# Patient Record
Sex: Female | Born: 1965 | Race: White | Hispanic: No | Marital: Single | Smoking: Former smoker
Health system: Southern US, Community
[De-identification: ages and names within clinical notes are randomized; demographics above are authoritative.]

## PROBLEM LIST (undated history)

## (undated) DIAGNOSIS — I493 Ventricular premature depolarization: Secondary | ICD-10-CM

## (undated) DIAGNOSIS — I447 Left bundle-branch block, unspecified: Secondary | ICD-10-CM

## (undated) DIAGNOSIS — F419 Anxiety disorder, unspecified: Secondary | ICD-10-CM

## (undated) DIAGNOSIS — F32A Depression, unspecified: Secondary | ICD-10-CM

## (undated) DIAGNOSIS — Q189 Congenital malformation of face and neck, unspecified: Secondary | ICD-10-CM

## (undated) DIAGNOSIS — E785 Hyperlipidemia, unspecified: Secondary | ICD-10-CM

## (undated) DIAGNOSIS — E669 Obesity, unspecified: Secondary | ICD-10-CM

## (undated) DIAGNOSIS — E041 Nontoxic single thyroid nodule: Secondary | ICD-10-CM

## (undated) DIAGNOSIS — I10 Essential (primary) hypertension: Secondary | ICD-10-CM

## (undated) DIAGNOSIS — K3184 Gastroparesis: Secondary | ICD-10-CM

## (undated) DIAGNOSIS — F329 Major depressive disorder, single episode, unspecified: Secondary | ICD-10-CM

## (undated) DIAGNOSIS — Z309 Encounter for contraceptive management, unspecified: Secondary | ICD-10-CM

## (undated) DIAGNOSIS — K449 Diaphragmatic hernia without obstruction or gangrene: Secondary | ICD-10-CM

## (undated) DIAGNOSIS — K219 Gastro-esophageal reflux disease without esophagitis: Secondary | ICD-10-CM

## (undated) DIAGNOSIS — B354 Tinea corporis: Secondary | ICD-10-CM

## (undated) DIAGNOSIS — L500 Allergic urticaria: Secondary | ICD-10-CM

## (undated) DIAGNOSIS — N390 Urinary tract infection, site not specified: Secondary | ICD-10-CM

## (undated) DIAGNOSIS — R87629 Unspecified abnormal cytological findings in specimens from vagina: Secondary | ICD-10-CM

## (undated) DIAGNOSIS — F1010 Alcohol abuse, uncomplicated: Secondary | ICD-10-CM

## (undated) DIAGNOSIS — E119 Type 2 diabetes mellitus without complications: Secondary | ICD-10-CM

## (undated) DIAGNOSIS — K810 Acute cholecystitis: Secondary | ICD-10-CM

## (undated) DIAGNOSIS — N200 Calculus of kidney: Secondary | ICD-10-CM

## (undated) HISTORY — DX: Urinary tract infection, site not specified: N39.0

## (undated) HISTORY — DX: Essential (primary) hypertension: I10

## (undated) HISTORY — DX: Ventricular premature depolarization: I49.3

## (undated) HISTORY — DX: Depression, unspecified: F32.A

## (undated) HISTORY — DX: Allergic urticaria: L50.0

## (undated) HISTORY — DX: Hyperlipidemia, unspecified: E78.5

## (undated) HISTORY — DX: Nontoxic single thyroid nodule: E04.1

## (undated) HISTORY — DX: Encounter for contraceptive management, unspecified: Z30.9

## (undated) HISTORY — DX: Acute cholecystitis: K81.0

## (undated) HISTORY — PX: TONSILLECTOMY: SUR1361

## (undated) HISTORY — DX: Tinea corporis: B35.4

## (undated) HISTORY — DX: Diaphragmatic hernia without obstruction or gangrene: K44.9

## (undated) HISTORY — DX: Gastro-esophageal reflux disease without esophagitis: K21.9

## (undated) HISTORY — DX: Anxiety disorder, unspecified: F41.9

## (undated) HISTORY — DX: Alcohol abuse, uncomplicated: F10.10

## (undated) HISTORY — DX: Congenital malformation of face and neck, unspecified: Q18.9

## (undated) HISTORY — DX: Unspecified abnormal cytological findings in specimens from vagina: R87.629

## (undated) HISTORY — DX: Calculus of kidney: N20.0

## (undated) HISTORY — DX: Obesity, unspecified: E66.9

## (undated) HISTORY — DX: Major depressive disorder, single episode, unspecified: F32.9

## (undated) HISTORY — PX: OTHER SURGICAL HISTORY: SHX169

## (undated) HISTORY — DX: Gastroparesis: K31.84

---

## 2007-01-21 ENCOUNTER — Encounter: Payer: Self-pay | Admitting: Family Medicine

## 2007-01-21 ENCOUNTER — Ambulatory Visit: Payer: Self-pay | Admitting: Family Medicine

## 2007-01-21 ENCOUNTER — Other Ambulatory Visit: Admission: RE | Admit: 2007-01-21 | Discharge: 2007-01-21 | Payer: Self-pay | Admitting: Family Medicine

## 2007-01-21 DIAGNOSIS — I10 Essential (primary) hypertension: Secondary | ICD-10-CM | POA: Insufficient documentation

## 2007-01-21 DIAGNOSIS — R319 Hematuria, unspecified: Secondary | ICD-10-CM | POA: Insufficient documentation

## 2007-01-21 DIAGNOSIS — Z862 Personal history of diseases of the blood and blood-forming organs and certain disorders involving the immune mechanism: Secondary | ICD-10-CM | POA: Insufficient documentation

## 2007-01-21 DIAGNOSIS — Q898 Other specified congenital malformations: Secondary | ICD-10-CM | POA: Insufficient documentation

## 2007-01-21 DIAGNOSIS — Z8639 Personal history of other endocrine, nutritional and metabolic disease: Secondary | ICD-10-CM

## 2007-01-21 LAB — CONVERTED CEMR LAB
Beta hcg, urine, semiquantitative: NEGATIVE
Bilirubin Urine: NEGATIVE
Glucose, Urine, Semiquant: NEGATIVE
Ketones, urine, test strip: NEGATIVE
Nitrite: NEGATIVE

## 2007-01-24 ENCOUNTER — Encounter (INDEPENDENT_AMBULATORY_CARE_PROVIDER_SITE_OTHER): Payer: Self-pay | Admitting: *Deleted

## 2007-01-25 ENCOUNTER — Telehealth (INDEPENDENT_AMBULATORY_CARE_PROVIDER_SITE_OTHER): Payer: Self-pay | Admitting: *Deleted

## 2007-01-27 ENCOUNTER — Encounter (INDEPENDENT_AMBULATORY_CARE_PROVIDER_SITE_OTHER): Payer: Self-pay | Admitting: *Deleted

## 2007-01-28 ENCOUNTER — Encounter: Admission: RE | Admit: 2007-01-28 | Discharge: 2007-01-28 | Payer: Self-pay | Admitting: Family Medicine

## 2007-01-28 ENCOUNTER — Ambulatory Visit: Payer: Self-pay | Admitting: Family Medicine

## 2007-02-01 LAB — CONVERTED CEMR LAB
AST: 22 units/L (ref 0–37)
Albumin: 3.8 g/dL (ref 3.5–5.2)
BUN: 8 mg/dL (ref 6–23)
Basophils Absolute: 0 10*3/uL (ref 0.0–0.1)
Basophils Relative: 0.1 % (ref 0.0–1.0)
Bilirubin, Direct: 0.1 mg/dL (ref 0.0–0.3)
CO2: 26 meq/L (ref 19–32)
Eosinophils Absolute: 0.1 10*3/uL (ref 0.0–0.6)
Eosinophils Relative: 1.8 % (ref 0.0–5.0)
HDL: 35.7 mg/dL — ABNORMAL LOW (ref >39.0)
Hemoglobin: 15.6 g/dL — ABNORMAL HIGH (ref 12.0–15.0)
MCHC: 35.2 g/dL (ref 30.0–36.0)
Neutro Abs: 5.7 10*3/uL (ref 1.4–7.7)
Neutrophils Relative %: 69.5 % (ref 43.0–77.0)
Platelets: 236 10*3/uL (ref 150–400)
RBC: 4.74 M/uL (ref 3.87–5.11)
Sodium: 140 meq/L (ref 135–145)
Triglycerides: 86 mg/dL (ref 0–149)
WBC: 8.1 10*3/uL (ref 4.5–10.5)

## 2007-02-08 ENCOUNTER — Telehealth (INDEPENDENT_AMBULATORY_CARE_PROVIDER_SITE_OTHER): Payer: Self-pay | Admitting: *Deleted

## 2007-02-14 ENCOUNTER — Telehealth (INDEPENDENT_AMBULATORY_CARE_PROVIDER_SITE_OTHER): Payer: Self-pay | Admitting: *Deleted

## 2007-02-24 ENCOUNTER — Encounter: Payer: Self-pay | Admitting: Family Medicine

## 2007-02-24 DIAGNOSIS — F329 Major depressive disorder, single episode, unspecified: Secondary | ICD-10-CM | POA: Insufficient documentation

## 2007-02-24 DIAGNOSIS — F3289 Other specified depressive episodes: Secondary | ICD-10-CM | POA: Insufficient documentation

## 2007-03-15 ENCOUNTER — Telehealth (INDEPENDENT_AMBULATORY_CARE_PROVIDER_SITE_OTHER): Payer: Self-pay | Admitting: *Deleted

## 2007-03-22 ENCOUNTER — Encounter: Payer: Self-pay | Admitting: Family Medicine

## 2007-04-22 ENCOUNTER — Encounter: Payer: Self-pay | Admitting: Family Medicine

## 2007-07-11 ENCOUNTER — Encounter: Admission: RE | Admit: 2007-07-11 | Discharge: 2007-07-11 | Payer: Self-pay | Admitting: Family Medicine

## 2007-07-11 ENCOUNTER — Encounter: Admission: RE | Admit: 2007-07-11 | Discharge: 2007-07-11 | Payer: Self-pay | Admitting: Endocrinology

## 2007-07-18 ENCOUNTER — Encounter (INDEPENDENT_AMBULATORY_CARE_PROVIDER_SITE_OTHER): Payer: Self-pay | Admitting: *Deleted

## 2007-09-02 ENCOUNTER — Ambulatory Visit: Payer: Self-pay | Admitting: Family Medicine

## 2007-09-02 DIAGNOSIS — K219 Gastro-esophageal reflux disease without esophagitis: Secondary | ICD-10-CM | POA: Insufficient documentation

## 2007-09-16 ENCOUNTER — Ambulatory Visit: Payer: Self-pay | Admitting: Family Medicine

## 2007-09-17 LAB — CONVERTED CEMR LAB
ALT: 27 units/L (ref 0–35)
Albumin: 4 g/dL (ref 3.5–5.2)
Alkaline Phosphatase: 51 units/L (ref 39–117)
Bilirubin, Direct: 0.1 mg/dL (ref 0.0–0.3)
Creatinine, Ser: 0.8 mg/dL (ref 0.4–1.2)
GFR calc non Af Amer: 85 mL/min
Glucose, Bld: 88 mg/dL (ref 70–99)
Potassium: 3.7 meq/L (ref 3.5–5.1)
Sodium: 138 meq/L (ref 135–145)
Total Bilirubin: 1 mg/dL (ref 0.3–1.2)
Total CHOL/HDL Ratio: 6.9
Total Protein: 7.1 g/dL (ref 6.0–8.3)
VLDL: 15 mg/dL (ref 0–40)

## 2007-09-19 ENCOUNTER — Encounter (INDEPENDENT_AMBULATORY_CARE_PROVIDER_SITE_OTHER): Payer: Self-pay | Admitting: *Deleted

## 2007-09-28 ENCOUNTER — Encounter: Payer: Self-pay | Admitting: Family Medicine

## 2007-10-03 ENCOUNTER — Telehealth (INDEPENDENT_AMBULATORY_CARE_PROVIDER_SITE_OTHER): Payer: Self-pay | Admitting: *Deleted

## 2007-10-20 ENCOUNTER — Telehealth (INDEPENDENT_AMBULATORY_CARE_PROVIDER_SITE_OTHER): Payer: Self-pay | Admitting: *Deleted

## 2007-11-25 ENCOUNTER — Ambulatory Visit: Payer: Self-pay | Admitting: Family Medicine

## 2008-01-20 ENCOUNTER — Other Ambulatory Visit: Admission: RE | Admit: 2008-01-20 | Discharge: 2008-01-20 | Payer: Self-pay | Admitting: Family Medicine

## 2008-01-20 ENCOUNTER — Ambulatory Visit: Payer: Self-pay | Admitting: Family Medicine

## 2008-01-20 ENCOUNTER — Encounter: Payer: Self-pay | Admitting: Family Medicine

## 2008-01-20 DIAGNOSIS — B354 Tinea corporis: Secondary | ICD-10-CM | POA: Insufficient documentation

## 2008-01-25 ENCOUNTER — Encounter (INDEPENDENT_AMBULATORY_CARE_PROVIDER_SITE_OTHER): Payer: Self-pay | Admitting: *Deleted

## 2008-01-29 LAB — CONVERTED CEMR LAB
ALT: 28 units/L (ref 0–35)
AST: 21 units/L (ref 0–37)
BUN: 11 mg/dL (ref 6–23)
Basophils Relative: 0.1 % (ref 0.0–3.0)
Creatinine, Ser: 0.9 mg/dL (ref 0.4–1.2)
Direct LDL: 148.1 mg/dL
GFR calc Af Amer: 89 mL/min
GFR calc non Af Amer: 74 mL/min
Lymphocytes Relative: 24.2 % (ref 12.0–46.0)
Monocytes Absolute: 0.4 10*3/uL (ref 0.1–1.0)
Monocytes Relative: 6 % (ref 3.0–12.0)
RBC: 4.84 M/uL (ref 3.87–5.11)
Triglycerides: 77 mg/dL (ref 0–149)
VLDL: 15 mg/dL (ref 0–40)
WBC: 7 10*3/uL (ref 4.5–10.5)

## 2008-01-30 ENCOUNTER — Encounter (INDEPENDENT_AMBULATORY_CARE_PROVIDER_SITE_OTHER): Payer: Self-pay | Admitting: *Deleted

## 2008-02-05 ENCOUNTER — Emergency Department (HOSPITAL_COMMUNITY): Admission: EM | Admit: 2008-02-05 | Discharge: 2008-02-05 | Payer: Self-pay | Admitting: Emergency Medicine

## 2008-02-24 ENCOUNTER — Ambulatory Visit: Payer: Self-pay | Admitting: Family Medicine

## 2008-03-12 ENCOUNTER — Telehealth (INDEPENDENT_AMBULATORY_CARE_PROVIDER_SITE_OTHER): Payer: Self-pay | Admitting: *Deleted

## 2008-03-16 ENCOUNTER — Telehealth (INDEPENDENT_AMBULATORY_CARE_PROVIDER_SITE_OTHER): Payer: Self-pay | Admitting: *Deleted

## 2008-03-16 ENCOUNTER — Ambulatory Visit: Payer: Self-pay | Admitting: Family Medicine

## 2008-03-16 DIAGNOSIS — L500 Allergic urticaria: Secondary | ICD-10-CM | POA: Insufficient documentation

## 2008-03-20 ENCOUNTER — Ambulatory Visit: Payer: Self-pay | Admitting: Internal Medicine

## 2008-03-22 ENCOUNTER — Telehealth (INDEPENDENT_AMBULATORY_CARE_PROVIDER_SITE_OTHER): Payer: Self-pay | Admitting: *Deleted

## 2008-03-23 ENCOUNTER — Ambulatory Visit: Payer: Self-pay | Admitting: Internal Medicine

## 2008-03-23 ENCOUNTER — Encounter: Payer: Self-pay | Admitting: Internal Medicine

## 2008-03-23 LAB — CONVERTED CEMR LAB: UREASE: NEGATIVE

## 2008-03-27 ENCOUNTER — Encounter: Payer: Self-pay | Admitting: Internal Medicine

## 2008-05-15 ENCOUNTER — Ambulatory Visit: Payer: Self-pay | Admitting: Family Medicine

## 2008-05-28 ENCOUNTER — Telehealth (INDEPENDENT_AMBULATORY_CARE_PROVIDER_SITE_OTHER): Payer: Self-pay | Admitting: *Deleted

## 2008-05-29 ENCOUNTER — Telehealth (INDEPENDENT_AMBULATORY_CARE_PROVIDER_SITE_OTHER): Payer: Self-pay | Admitting: *Deleted

## 2008-07-30 ENCOUNTER — Telehealth: Payer: Self-pay | Admitting: Family Medicine

## 2008-08-07 ENCOUNTER — Ambulatory Visit: Payer: Self-pay | Admitting: Family Medicine

## 2008-08-07 DIAGNOSIS — K810 Acute cholecystitis: Secondary | ICD-10-CM | POA: Insufficient documentation

## 2008-08-24 ENCOUNTER — Encounter: Admission: RE | Admit: 2008-08-24 | Discharge: 2008-08-24 | Payer: Self-pay | Admitting: Endocrinology

## 2008-11-06 ENCOUNTER — Ambulatory Visit: Payer: Self-pay | Admitting: Family Medicine

## 2008-11-06 ENCOUNTER — Telehealth (INDEPENDENT_AMBULATORY_CARE_PROVIDER_SITE_OTHER): Payer: Self-pay | Admitting: *Deleted

## 2008-11-06 ENCOUNTER — Encounter (INDEPENDENT_AMBULATORY_CARE_PROVIDER_SITE_OTHER): Payer: Self-pay | Admitting: *Deleted

## 2009-01-18 ENCOUNTER — Telehealth: Payer: Self-pay | Admitting: Family Medicine

## 2009-02-12 ENCOUNTER — Ambulatory Visit: Payer: Self-pay | Admitting: Family Medicine

## 2009-02-12 LAB — CONVERTED CEMR LAB
Bilirubin Urine: NEGATIVE
Glucose, Urine, Semiquant: NEGATIVE
Ketones, urine, test strip: NEGATIVE
Nitrite: NEGATIVE
Protein, U semiquant: NEGATIVE
Urobilinogen, UA: 0.2
pH: 5

## 2009-02-18 ENCOUNTER — Encounter: Payer: Self-pay | Admitting: Family Medicine

## 2009-02-18 LAB — CONVERTED CEMR LAB
AST: 23 units/L (ref 0–37)
Albumin: 4 g/dL (ref 3.5–5.2)
Alkaline Phosphatase: 56 units/L (ref 39–117)
Basophils Relative: 0.7 % (ref 0.0–3.0)
Chloride: 109 meq/L (ref 96–112)
Eosinophils Absolute: 0.1 10*3/uL (ref 0.0–0.7)
Eosinophils Relative: 1.2 % (ref 0.0–5.0)
HDL: 37.7 mg/dL — ABNORMAL LOW (ref >39.00)
LDL Cholesterol: 118 mg/dL — ABNORMAL HIGH (ref 0–99)
Lymphocytes Relative: 21.7 % (ref 12.0–46.0)
MCV: 93.2 fL (ref 78.0–100.0)
Potassium: 4.2 meq/L (ref 3.5–5.1)
RBC: 4.73 M/uL (ref 3.87–5.11)
TSH: 0.58 microintl units/mL (ref 0.35–5.50)
Total Bilirubin: 0.8 mg/dL (ref 0.3–1.2)
Total CHOL/HDL Ratio: 5
Total Protein: 7.5 g/dL (ref 6.0–8.3)
VLDL: 15.6 mg/dL (ref 0.0–40.0)

## 2009-02-21 ENCOUNTER — Telehealth (INDEPENDENT_AMBULATORY_CARE_PROVIDER_SITE_OTHER): Payer: Self-pay | Admitting: *Deleted

## 2009-02-21 ENCOUNTER — Other Ambulatory Visit: Admission: RE | Admit: 2009-02-21 | Discharge: 2009-02-21 | Payer: Self-pay | Admitting: Family Medicine

## 2009-02-21 ENCOUNTER — Encounter: Payer: Self-pay | Admitting: Family Medicine

## 2009-02-21 ENCOUNTER — Ambulatory Visit: Payer: Self-pay | Admitting: Family Medicine

## 2009-02-21 DIAGNOSIS — R079 Chest pain, unspecified: Secondary | ICD-10-CM | POA: Insufficient documentation

## 2009-02-21 DIAGNOSIS — Z8719 Personal history of other diseases of the digestive system: Secondary | ICD-10-CM | POA: Insufficient documentation

## 2009-02-21 DIAGNOSIS — R159 Full incontinence of feces: Secondary | ICD-10-CM | POA: Insufficient documentation

## 2009-02-22 ENCOUNTER — Telehealth: Payer: Self-pay | Admitting: Family Medicine

## 2009-02-23 ENCOUNTER — Encounter: Payer: Self-pay | Admitting: Cardiovascular Disease

## 2009-02-25 ENCOUNTER — Encounter: Payer: Self-pay | Admitting: Family Medicine

## 2009-02-26 ENCOUNTER — Encounter: Payer: Self-pay | Admitting: Family Medicine

## 2009-02-26 ENCOUNTER — Ambulatory Visit: Payer: Self-pay

## 2009-02-27 ENCOUNTER — Ambulatory Visit: Payer: Self-pay | Admitting: Cardiovascular Disease

## 2009-02-27 DIAGNOSIS — I4949 Other premature depolarization: Secondary | ICD-10-CM | POA: Insufficient documentation

## 2009-02-27 DIAGNOSIS — R42 Dizziness and giddiness: Secondary | ICD-10-CM | POA: Insufficient documentation

## 2009-03-05 ENCOUNTER — Telehealth (INDEPENDENT_AMBULATORY_CARE_PROVIDER_SITE_OTHER): Payer: Self-pay | Admitting: *Deleted

## 2009-03-26 ENCOUNTER — Ambulatory Visit: Payer: Self-pay | Admitting: Internal Medicine

## 2009-04-24 ENCOUNTER — Ambulatory Visit: Payer: Self-pay | Admitting: Cardiovascular Disease

## 2009-06-14 ENCOUNTER — Ambulatory Visit: Payer: Self-pay | Admitting: Family Medicine

## 2009-06-26 ENCOUNTER — Encounter: Payer: Self-pay | Admitting: Family Medicine

## 2009-07-12 ENCOUNTER — Ambulatory Visit: Payer: Self-pay | Admitting: Cardiovascular Disease

## 2009-07-12 DIAGNOSIS — I119 Hypertensive heart disease without heart failure: Secondary | ICD-10-CM | POA: Insufficient documentation

## 2009-08-23 ENCOUNTER — Ambulatory Visit: Payer: Self-pay | Admitting: Family Medicine

## 2009-08-26 ENCOUNTER — Encounter: Admission: RE | Admit: 2009-08-26 | Discharge: 2009-08-26 | Payer: Self-pay | Admitting: Endocrinology

## 2009-08-27 LAB — CONVERTED CEMR LAB
ALT: 32 units/L (ref 0–35)
Bilirubin, Direct: 0 mg/dL (ref 0.0–0.3)
HDL: 48.6 mg/dL (ref >39.00)
Total Bilirubin: 0.4 mg/dL (ref 0.3–1.2)

## 2010-01-06 ENCOUNTER — Ambulatory Visit: Payer: Self-pay | Admitting: Cardiovascular Disease

## 2010-03-07 ENCOUNTER — Ambulatory Visit: Payer: Self-pay | Admitting: Family Medicine

## 2010-03-07 ENCOUNTER — Other Ambulatory Visit: Admission: RE | Admit: 2010-03-07 | Discharge: 2010-03-07 | Payer: Self-pay | Admitting: Family Medicine

## 2010-03-10 LAB — CONVERTED CEMR LAB
Eosinophils Absolute: 0.1 10*3/uL (ref 0.0–0.7)
Lymphocytes Relative: 22.6 % (ref 12.0–46.0)
MCHC: 34 g/dL (ref 30.0–36.0)
MCV: 94.3 fL (ref 78.0–100.0)
Monocytes Relative: 5.7 % (ref 3.0–12.0)
Platelets: 194 10*3/uL (ref 150.0–400.0)
RDW: 13.5 % (ref 11.5–14.6)
TSH: 0.41 microintl units/mL (ref 0.35–5.50)

## 2010-03-12 ENCOUNTER — Encounter: Payer: Self-pay | Admitting: Family Medicine

## 2010-03-13 ENCOUNTER — Ambulatory Visit: Payer: Self-pay | Admitting: Family Medicine

## 2010-03-21 ENCOUNTER — Ambulatory Visit: Payer: Self-pay | Admitting: Family Medicine

## 2010-03-21 DIAGNOSIS — B079 Viral wart, unspecified: Secondary | ICD-10-CM | POA: Insufficient documentation

## 2010-04-02 ENCOUNTER — Telehealth (INDEPENDENT_AMBULATORY_CARE_PROVIDER_SITE_OTHER): Payer: Self-pay | Admitting: *Deleted

## 2010-04-10 ENCOUNTER — Ambulatory Visit: Payer: Self-pay | Admitting: Family Medicine

## 2010-04-10 DIAGNOSIS — E785 Hyperlipidemia, unspecified: Secondary | ICD-10-CM | POA: Insufficient documentation

## 2010-04-22 ENCOUNTER — Ambulatory Visit: Payer: Self-pay | Admitting: Family Medicine

## 2010-04-22 DIAGNOSIS — N760 Acute vaginitis: Secondary | ICD-10-CM | POA: Insufficient documentation

## 2010-04-22 DIAGNOSIS — Z87448 Personal history of other diseases of urinary system: Secondary | ICD-10-CM | POA: Insufficient documentation

## 2010-04-22 LAB — CONVERTED CEMR LAB
Bilirubin Urine: NEGATIVE
Glucose, Urine, Semiquant: NEGATIVE
Protein, U semiquant: NEGATIVE

## 2010-04-23 ENCOUNTER — Encounter: Payer: Self-pay | Admitting: Family Medicine

## 2010-04-23 ENCOUNTER — Telehealth (INDEPENDENT_AMBULATORY_CARE_PROVIDER_SITE_OTHER): Payer: Self-pay | Admitting: *Deleted

## 2010-04-23 LAB — CONVERTED CEMR LAB
Chlamydia, Swab/Urine, PCR: NEGATIVE
GC Probe Amp, Urine: NEGATIVE

## 2010-05-15 ENCOUNTER — Telehealth: Payer: Self-pay | Admitting: Cardiovascular Disease

## 2010-07-02 NOTE — Letter (Signed)
Summary: Proof of Physical Form/[employer]  Proof of Physical Form/[facility] [employer]   Imported By: Lanelle Bal 08/31/2009 11:24:39  _____________________________________________________________________  External Attachment:    Type:   Image     Comment:   External Document

## 2010-07-02 NOTE — Assessment & Plan Note (Signed)
Summary: FOR A SHOT SHE WILL BE BRINGING//PH  Nurse Visit   Allergies: 1)  ! Feldene (Piroxicam)  Medication Administration  Injection # 1:    Medication: Depo-Provera 150mg     Diagnosis: OTHER SPECIFIED CONTRACEPTIVE MANAGEMENT (ICD-V25.8)    Route: IM    Site: R deltoid    Exp Date: 07/2011    Lot #: [id number]    Mfr: [email or web address]    Patient tolerated injection without complications    Given by: Floydene Flock (June 14, 2009 10:18 AM)  Orders Added: 1)  Admin of patients own med IM/SQ [96372M]   Medication Administration  Injection # 1:    Medication: Depo-Provera 150mg     Diagnosis: OTHER SPECIFIED CONTRACEPTIVE MANAGEMENT (ICD-V25.8)    Route: IM    Site: R deltoid    Exp Date: 07/2011    Lot #: [id number]    Mfr: greenstone    Patient tolerated injection without complications    Given by: Floydene Flock (June 14, 2009 10:18 AM)  Orders Added: 1)  Admin of patients own med IM/SQ [phone]

## 2010-07-02 NOTE — Letter (Signed)
Summary: [facility]  [facility]   Imported By: Lanelle Bal 07/03/2009 12:10:00  _____________________________________________________________________  External Attachment:    Type:   Image     Comment:   External Document

## 2010-07-02 NOTE — Assessment & Plan Note (Signed)
Summary: vaginal itch/frequent urination/cbs   Vital Signs:  Patient profile:   45 year old female Weight:      181.2 pounds Temp:     98.0 degrees F oral Pulse rate:   60 / minute Pulse rhythm:   regular BP sitting:   114 / 70  (right arm) Cuff size:   large  Vitals Entered By: Almeta Monas CMA Duncan Dull) (April 22, 2010 11:28 AM) CC: c/o frequency and vaginal itching x1week   History of Present Illness: Pt here c/o vaginal itchin and frequency.  No D/c or odor per pt .  Current Medications (verified): 1)  Atenolol 100 Mg Tabs (Atenolol) .... Take One Tablet By Mouth Daily 2)  Depo-Provera 150 Mg/ml Im Susp (Medroxyprogesterone Acetate) .Marland Kitchen.. 1 Injection Every 3 Months 3)  Oxybutynin Chloride 10 Mg Xr24h-Tab (Oxybutynin Chloride) .Marland Kitchen.. 1 By Mouth Once Daily 4)  Prilosec 20 Mg  Cpdr (Omeprazole) .Marland Kitchen.. 1 Tab Once Daily 5)  Flexeril 10 Mg Tabs (Cyclobenzaprine Hcl) .Marland Kitchen.. 1 By Mouth Three Times A Day As Needed. 6)  Anusol-Hc 25 Mg Supp (Hydrocortisone Acetate) .... Insert 1 Suppository Into Rectum At Bedtime 7)  Bentyl 20 Mg Tabs (Dicyclomine Hcl) .... Take 1 Tablet By Mouth Once Daily 8)  Simcor 1000-40 Mg Xr24h-Tab (Niacin-Simvastatin) .Marland Kitchen.. 1 By Mouth At Bedtime 9)  Fluconazole 150 Mg Tabs (Fluconazole) .Marland Kitchen.. 1 By Mouth Once Daily X1  , May Repeat in 3 Days As Needed  Allergies (verified): 1)  ! Feldene (Piroxicam)  Past History:  Past Medical History: Last updated: 07/12/2009 Hypertension with hyptertensive heart disease, grade 1 diastolic dysfunction.  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 Depression Kidney Stones Obesity Urinary Tract Infection Hiatal hernia  Past Surgical History: Last updated: 02/27/2009 Tonsillectomy LEEP SURGERY-MID 20'S    Current Problems:  HIATAL HERNIA WITH REFLUX, HX OF (ICD-V12.79) OTHER SPECIFIED CONTRACEPTIVE MANAGEMENT (ICD-V25.8) CHOLECYSTITIS - ACUTE (ICD-575.0) * CONTRACEPTIVE MANAGEMENT TOBACCO ABUSE  (ICD-305.1) ALLERGIC URTICARIA (ICD-708.0) TINEA CORPORIS (ICD-110.5) PREVENTIVE HEALTH CARE (ICD-V70.0) FAMILY HISTORY BREAST CANCER 1ST DEGREE RELATIVE <50 (ICD-V16.3) UNSPECIFIED CONTRACEPTIVE MANAGEMENT (ICD-V25.9) GERD (ICD-530.81) DEPRESSION (ICD-311) PREVENTIVE HEALTH CARE (ICD-V70.0) THYROID NODULE, HX OF (ICD-V12.2) HEMATURIA (ICD-599.7) ANOMALY, 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) HYPERTENSION (ICD-401.9) FAMILY HISTORY DIABETES 1ST DEGREE RELATIVE (ICD-V18.0)  Family History: Last updated: 03/07/2010 FATHER: LIVING, no cardiac problems 1 BROTHER: LIVING Family History Hypertension; BOTH SIDES OF FAMILY Grandmother had MI age 65 Family History Lung cancer: PGM, MGM Family History Diabetes 1st degree relative: MOTHER, PGM, AUNT(FATHERS SIDE) Family History Thyroid disease: MOTHER Family History Breast cancer 1st degree relative --43yo Family History of Breast Cancer:Mother No FH of Colon Cancer: Family History of Colon Polyps:Mother Family History of Diabetes: Mother Family History of Irritable Bowel Syndrome:Mother Family History of Liver Disease/Cirrhosis:Mother Family History of CAD Female 1st degree relative 43yo  Social History: Last updated: 02/27/2009 Occupation: [employer]  Single Former tobacco use-1 ppd, none since February 2010 Alcohol use-no--Hx alcoholsim-- detox 7/13-7/17/07 Drug use-no Regular exercise-no  Risk Factors: Alcohol Use: 0 (03/07/2010) Caffeine Use: 0 (03/07/2010) Diet: weight watchers  (03/07/2010) Exercise: no (03/07/2010)  Risk Factors: Smoking Status: quit > 6 months (03/07/2010) Packs/Day: 1.0 (03/07/2010) Cans of tobacco/wk: no (03/07/2010) Passive Smoke Exposure: no (03/07/2010)  Family History: Reviewed history from 03/07/2010 and no changes required. FATHER: LIVING, no cardiac problems 1 BROTHER: LIVING Family History Hypertension; BOTH SIDES OF FAMILY Grandmother had MI age 56 Family History Lung cancer: PGM,  MGM Family History Diabetes 1st degree relative: MOTHER, PGM, AUNT(FATHERS SIDE) Family History Thyroid  disease: MOTHER Family History Breast cancer 1st degree relative --43yo Family History of Breast Cancer:Mother No FH of Colon Cancer: Family History of Colon Polyps:Mother Family History of Diabetes: Mother Family History of Irritable Bowel Syndrome:Mother Family History of Liver Disease/Cirrhosis:Mother Family History of CAD Female 1st degree relative 43yo  Social History: Reviewed history from 02/27/2009 and no changes required. Occupation: [employer]  Single Former tobacco use-1 ppd, none since February 2010 Alcohol use-no--Hx alcoholsim-- detox 7/13-7/17/07 Drug use-no Regular exercise-no  Review of Systems      See HPI  Physical Exam  General:  Well-developed,well-nourished,in no acute distress; alert,appropriate and cooperative throughout examination Abdomen:  Bowel sounds positive,abdomen soft and non-tender without masses, organomegaly or hernias noted. Genitalia:  + thin white d/c no cervical lesions normal introitus and no external lesions.   Psych:  Oriented X3 and normally interactive.     Impression & Recommendations:  Problem # 1:  VAGINITIS (ICD-616.10) Use diflucan and monistat on ext genitalia  Orders: T-Chlamydia  Probe, urine ([id number]) T-GC Probe, urine ([id number]) UA Dipstick w/o Micro (manual) (78469)  Discussed symptomatic relief and treatment options.   Problem # 2:  HEMATURIA, HX OF (ICD-V13.09)  Orders: T-Culture, Urine ([id number]) UA Dipstick w/o Micro (manual) (44010)  Complete Medication List: 1)  Atenolol 100 Mg Tabs (Atenolol) .... Take one tablet by mouth daily 2)  Depo-provera 150 Mg/ml Im Susp (Medroxyprogesterone acetate) .Marland Kitchen.. 1 injection every 3 months 3)  Oxybutynin Chloride 10 Mg Xr24h-tab (Oxybutynin chloride) .Marland Kitchen.. 1 by mouth once daily 4)  Prilosec 20 Mg Cpdr (Omeprazole) .Marland Kitchen.. 1 tab once daily 5)  Flexeril 10 Mg  Tabs (Cyclobenzaprine hcl) .Marland Kitchen.. 1 by mouth three times a day as needed. 6)  Anusol-hc 25 Mg Supp (Hydrocortisone acetate) .... Insert 1 suppository into rectum at bedtime 7)  Bentyl 20 Mg Tabs (Dicyclomine hcl) .... Take 1 tablet by mouth once daily 8)  Simcor 1000-40 Mg Xr24h-tab (Niacin-simvastatin) .Marland Kitchen.. 1 by mouth at bedtime 9)  Fluconazole 150 Mg Tabs (Fluconazole) .Marland Kitchen.. 1 by mouth once daily x1  , may repeat in 3 days as needed Prescriptions: FLUCONAZOLE 150 MG TABS (FLUCONAZOLE) 1 by mouth once daily x1  , may repeat in 3 days as needed  #2 x 0   Entered and Authorized by:   Loreen Freud DO   Signed by:   Loreen Freud DO on 04/22/2010   Method used:   Electronically to        [id number] ([facility]       [address] [place], [place]  [address]       Ph: [phone] or [phone]       Fax: [phone]   RxID:   [id number]    Orders Added: 1)  T-Culture, Urine [[id number]] 2)  T-Chlamydia  Probe, urine [phone] 3)  T-GC Probe, urine [phone] 4)  Est. Patient Level IV [71062] 5)  UA Dipstick w/o Micro (manual) [81002]    Laboratory Results   Urine Tests    Routine Urinalysis   Color: lt. yellow Appearance: Hazy Glucose: negative   (Normal Range: Negative) Bilirubin: negative   (Normal Range: Negative) Ketone: negative   (Normal Range: Negative) Spec. Gravity: <1.005   (Normal Range: 1.003-1.035) Blood: small   (Normal Range: Negative) pH: 7.5   (Normal Range: 5.0-8.0) Protein: negative   (Normal Range: Negative) Urobilinogen: 1.0   (Normal Range: 0-1) Nitrite: negative   (  Normal Range: Negative) Leukocyte Esterace: large   (Normal Range: Negative)

## 2010-07-02 NOTE — Letter (Signed)
Summary: [facility]: Immunoassay Fecal Occult Blood (iFOB) Order Form  [facility] [place]  [address] [place], [place] [address]   Phone: [phone]  Fax: [phone]      [facility]: Immunoassay Fecal Occult Blood (iFOB) Order Form   March 12, 2010 MRN: [id number]   [name] Ramos 1966-04-24   Physicican Name: Dr.Lowne  Diagnosis Code: V56.71      Almeta Monas CMA (AAMA)

## 2010-07-02 NOTE — Assessment & Plan Note (Signed)
Summary: cpx & lab/cbs   Vital Signs:  Patient profile:   45 year old female Height:      65.25 inches Weight:      188.0 pounds Pulse rate:   58 / minute Pulse rhythm:   irregular BP sitting:   120 / 66  (right arm) Cuff size:   large  Vitals Entered By: Almeta Monas CMA Duncan Dull) (March 07, 2010 8:36 AM) CC: cpx with pap smear   History of Present Illness: Pt here for cpe, pap and labs.    No complaints.  Pt just saw cardiology and pvc are finally controlled.  No cp or sob.   Pt is complaining about numb L big toe--since haveing toenail removed about 10 years ago but numbness seems to be worse now.   No other complaints.   Preventive Screening-Counseling & Management  Alcohol-Tobacco     Alcohol drinks/day: 0     Alcohol Counseling: quit 3 years ago 10/07     Smoking Status: quit > 6 months     Smoking Cessation Counseling: no     Smoke Cessation Stage: quit     Packs/Day: 1.0     Year Started: 1989     Year Quit: 2010     Pack years: 21     Cans of tobacco/week: no     Passive Smoke Exposure: no  Caffeine-Diet-Exercise     Caffeine use/day: 0     Diet Comments: weight watchers      Does Patient Exercise: no     Exercise Counseling: to improve exercise regimen  Hep-HIV-STD-Contraception     HIV Risk: no     Dental Visit-last 6 months yes     Dental Care Counseling: not indicated; dental care within six months     SBE monthly: no     SBE Education/Counseling: to perform regular SBE     Sun Exposure-Excessive: yes  Safety-Violence-Falls     Seat Belt Use: yes      Sexual History:  single.        Drug Use:  never.    Current Medications (verified): 1)  Atenolol 100 Mg Tabs (Atenolol) .... Take One Tablet By Mouth Daily 2)  Depo-Provera 150 Mg/ml Im Susp (Medroxyprogesterone Acetate) .Marland Kitchen.. 1 Injection Every 3 Months 3)  Oxybutynin Chloride 10 Mg Xr24h-Tab (Oxybutynin Chloride) .Marland Kitchen.. 1 By Mouth Once Daily 4)  Prilosec 20 Mg  Cpdr (Omeprazole) .Marland Kitchen.. 1 Tab Once  Daily 5)  Flexeril 10 Mg Tabs (Cyclobenzaprine Hcl) .Marland Kitchen.. 1 By Mouth Three Times A Day As Needed. 6)  Anusol-Hc 25 Mg Supp (Hydrocortisone Acetate) .... Insert 1 Suppository Into Rectum At Bedtime 7)  Bentyl 20 Mg Tabs (Dicyclomine Hcl) .... Take 1 Tablet By Mouth Once Daily  Allergies (verified): 1)  ! Feldene (Piroxicam)  Past History:  Past Medical History: Last updated: 07/12/2009 Hypertension with hyptertensive heart disease, grade 1 diastolic dysfunction.  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 Depression Kidney Stones Obesity Urinary Tract Infection Hiatal hernia  Past Surgical History: Last updated: 02/27/2009 Tonsillectomy LEEP SURGERY-MID 20'S    Current Problems:  HIATAL HERNIA WITH REFLUX, HX OF (ICD-V12.79) OTHER SPECIFIED CONTRACEPTIVE MANAGEMENT (ICD-V25.8) CHOLECYSTITIS - ACUTE (ICD-575.0) * CONTRACEPTIVE MANAGEMENT TOBACCO ABUSE (ICD-305.1) ALLERGIC URTICARIA (ICD-708.0) TINEA CORPORIS (ICD-110.5) PREVENTIVE HEALTH CARE (ICD-V70.0) FAMILY HISTORY BREAST CANCER 1ST DEGREE RELATIVE <50 (ICD-V16.3) UNSPECIFIED CONTRACEPTIVE MANAGEMENT (ICD-V25.9) GERD (ICD-530.81) DEPRESSION (ICD-311) PREVENTIVE HEALTH CARE (ICD-V70.0) THYROID NODULE, HX OF (ICD-V12.2) HEMATURIA (ICD-599.7) ANOMALY, 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) HYPERTENSION (ICD-401.9) FAMILY  HISTORY DIABETES 1ST DEGREE RELATIVE (ICD-V18.0)  Family History: Last updated: 03/07/2010 FATHER: LIVING, no cardiac problems 1 BROTHER: LIVING Family History Hypertension; BOTH SIDES OF FAMILY Grandmother had MI age 78 Family History Lung cancer: PGM, MGM Family History Diabetes 1st degree relative: MOTHER, PGM, AUNT(FATHERS SIDE) Family History Thyroid disease: MOTHER Family History Breast cancer 1st degree relative --43yo Family History of Breast Cancer:Mother No FH of Colon Cancer: Family History of Colon Polyps:Mother Family History of Diabetes: Mother Family History of Irritable Bowel  Syndrome:Mother Family History of Liver Disease/Cirrhosis:Mother Family History of CAD Female 1st degree relative 43yo  Social History: Last updated: 02/27/2009 Occupation: [employer]  Single Former tobacco use-1 ppd, none since February 2010 Alcohol use-no--Hx alcoholsim-- detox 7/13-7/17/07 Drug use-no Regular exercise-no  Risk Factors: Alcohol Use: 0 (03/07/2010) Caffeine Use: 0 (03/07/2010) Diet: weight watchers  (03/07/2010) Exercise: no (03/07/2010)  Risk Factors: Smoking Status: quit > 6 months (03/07/2010) Packs/Day: 1.0 (03/07/2010) Cans of tobacco/wk: no (03/07/2010) Passive Smoke Exposure: no (03/07/2010)  Family History: Reviewed history from 02/27/2009 and no changes required. FATHER: LIVING, no cardiac problems 1 BROTHER: LIVING Family History Hypertension; BOTH SIDES OF FAMILY Grandmother had MI age 11 Family History Lung cancer: PGM, MGM Family History Diabetes 1st degree relative: MOTHER, PGM, AUNT(FATHERS SIDE) Family History Thyroid disease: MOTHER Family History Breast cancer 1st degree relative --43yo Family History of Breast Cancer:Mother No FH of Colon Cancer: Family History of Colon Polyps:Mother Family History of Diabetes: Mother Family History of Irritable Bowel Syndrome:Mother Family History of Liver Disease/Cirrhosis:Mother Family History of CAD Female 1st degree relative 43yo  Social History: Reviewed history from 02/27/2009 and no changes required. Occupation: [employer]  Single Former tobacco use-1 ppd, none since February 2010 Alcohol use-no--Hx alcoholsim-- detox 7/13-7/17/07 Drug use-no Regular exercise-no Does Patient Exercise:  no Sexual History:  single  Review of Systems      See HPI General:  Denies chills, fatigue, fever, loss of appetite, malaise, sleep disorder, sweats, weakness, and weight loss. Eyes:  Denies blurring, discharge, double vision, eye irritation, eye pain, halos, itching, light sensitivity, red eye,  vision loss-1 eye, and vision loss-both eyes; optho q2y . ENT:  Denies decreased hearing, difficulty swallowing, ear discharge, earache, hoarseness, nasal congestion, nosebleeds, postnasal drainage, ringing in ears, sinus pressure, and sore throat. CV:  Denies bluish discoloration of lips or nails, chest pain or discomfort, difficulty breathing at night, difficulty breathing while lying down, fainting, fatigue, leg cramps with exertion, lightheadness, near fainting, palpitations, shortness of breath with exertion, swelling of feet, swelling of hands, and weight gain. Resp:  Denies chest discomfort, chest pain with inspiration, cough, coughing up blood, excessive snoring, hypersomnolence, morning headaches, pleuritic, shortness of breath, sputum productive, and wheezing. GI:  Denies abdominal pain, bloody stools, change in bowel habits, constipation, dark tarry stools, diarrhea, excessive appetite, gas, hemorrhoids, indigestion, loss of appetite, nausea, vomiting, vomiting blood, and yellowish skin color. GU:  Denies abnormal vaginal bleeding, decreased libido, discharge, dysuria, genital sores, hematuria, incontinence, nocturia, urinary frequency, and urinary hesitancy. MS:  Denies joint pain, joint redness, joint swelling, loss of strength, low back pain, mid back pain, muscle aches, muscle , cramps, muscle weakness, stiffness, and thoracic pain. Derm:  Denies changes in color of skin, changes in nail beds, dryness, excessive perspiration, flushing, hair loss, insect bite(s), itching, lesion(s), poor wound healing, and rash. Neuro:  Complains of numbness; denies brief paralysis, difficulty with concentration, disturbances in coordination, falling down, headaches, inability to speak, memory loss, poor balance, seizures, sensation  of room spinning, tingling, tremors, visual disturbances, and weakness; numbness L toe. Psych:  Denies alternate hallucination ( auditory/visual), anxiety, depression, easily  angered, easily tearful, irritability, mental problems, panic attacks, sense of great danger, suicidal thoughts/plans, thoughts of violence, unusual visions or sounds, and thoughts /plans of harming others. Endo:  Denies cold intolerance, excessive hunger, excessive thirst, excessive urination, heat intolerance, polyuria, and weight change. Heme:  Denies abnormal bruising, bleeding, enlarge lymph nodes, fevers, pallor, and skin discoloration. Allergy:  Denies hives or rash, itching eyes, persistent infections, seasonal allergies, and sneezing.  Physical Exam  General:  Well-developed,well-nourished,in no acute distress; alert,appropriate and cooperative throughout examination Head:  Normocephalic and atraumatic without obvious abnormalities. No apparent alopecia or balding. Eyes:  vision grossly intact, pupils equal, pupils round, pupils reactive to light, and no injection.   Ears:  External ear exam shows no significant lesions or deformities.  Otoscopic examination reveals clear canals, tympanic membranes are intact bilaterally without bulging, retraction, inflammation or discharge. Hearing is grossly normal bilaterally. Nose:  External nasal examination shows no deformity or inflammation. Nasal mucosa are pink and moist without lesions or exudates. Mouth:  Oral mucosa and oropharynx without lesions or exudates.  Teeth in good repair. Neck:  No deformities, masses, or tenderness noted.no carotid bruits.   Chest Wall:  No deformities, masses, or tenderness noted. Breasts:  No mass, nodules, thickening, tenderness, bulging, retraction, inflamation, nipple discharge or skin changes noted.   Lungs:  Normal respiratory effort, chest expands symmetrically. Lungs are clear to auscultation, no crackles or wheezes. Heart:  Normal rate and regular rhythm. S1 and S2 normal without gallop, murmur, click, rub or other extra sounds. Abdomen:  Bowel sounds positive,abdomen soft and non-tender without masses,  organomegaly or hernias noted. Rectal:  No external abnormalities noted. Normal sphincter tone. No rectal masses or tenderness.  heme negative brown stool Genitalia:  Pelvic Exam:        External: normal female genitalia without lesions or masses        Vagina: normal without lesions or masses        Cervix: normal without lesions or masses        Adnexa: normal bimanual exam without masses or fullness        Uterus: normal by palpation        Pap smear: performed Msk:  No deformity or scoliosis noted of thoracic or lumbar spine.   Pulses:  R and L carotid,radial,femoral,dorsalis pedis and posterior tibial pulses are full and equal bilaterally Extremities:  No clubbing, cyanosis, edema, or deformity noted with normal full range of motion of all joints.   Neurologic:  No cranial nerve deficits noted. Station and gait are normal. Plantar reflexes are down-going bilaterally. DTRs are symmetrical throughout. Sensory, motor and coordinative functions appear intact. Skin:  R middle finger--small wart otherwise normal decrease sensation R big toe  Cervical Nodes:  No lymphadenopathy noted Axillary Nodes:  No palpable lymphadenopathy Psych:  Cognition and judgment appear intact. Alert and cooperative with normal attention span and concentration. No apparent delusions, illusions, hallucinations   Impression & Recommendations:  Problem # 1:  PREVENTIVE HEALTH CARE (ICD-V70.0) check fasting labs ghm utd Orders: TLB-CBC Platelet - w/Differential (85025-CBCD) TLB-TSH (Thyroid Stimulating Hormone) (84443-TSH) T- * Misc. Laboratory test [phone]) EKG w/ Interpretation (93000)  Problem # 2:  HYPERTENSION, HEART CONTROLLED W/O ASSOC CHF (ICD-402.10)  Her updated medication list for this problem includes:    Atenolol 100 Mg Tabs (Atenolol) .Marland Kitchen... Take one tablet by mouth  daily  Orders: TLB-CBC Platelet - w/Differential (85025-CBCD) TLB-TSH (Thyroid Stimulating Hormone) (84443-TSH) T- * Misc.  Laboratory test [phone])  BP today: 120/66 Prior BP: 118/60 (01/06/2010)  Labs Reviewed: K+: 4.2 (02/12/2009) Creat: : 0.9 (02/12/2009)   Chol: 183 (08/23/2009)   HDL: 48.60 (08/23/2009)   LDL: 118 (08/23/2009)   TG: 82.0 (08/23/2009)  Problem # 3:  HIATAL HERNIA WITH REFLUX, HX OF (ICD-V12.79)  Problem # 4:  HYPERTENSION (ICD-401.9)  Her updated medication list for this problem includes:    Atenolol 100 Mg Tabs (Atenolol) .Marland Kitchen... Take one tablet by mouth daily  Orders: TLB-CBC Platelet - w/Differential (85025-CBCD) TLB-TSH (Thyroid Stimulating Hormone) (84443-TSH) T- * Misc. Laboratory test [phone])  Problem # 5:  PREMATURE VENTRICULAR CONTRACTIONS (ICD-427.69) Assessment: Improved  Her updated medication list for this problem includes:    Atenolol 100 Mg Tabs (Atenolol) .Marland Kitchen... Take one tablet by mouth daily  Echocardiogram: 1. Left ventricle: The cavity size was normal. Wall thickness was        normal. Systolic function was normal. The estimated ejection        fraction was in the range of 55% to 60%. Wall motion was normal;        there were no regional wall motion abnormalities. Doppler        parameters are consistent with abnormal left ventricular        relaxation (grade 1 diastolic dysfunction).     2. Mitral valve: Mild regurgitation.     3. Left atrium: The atrium was mildly dilated. (02/26/2009)  Labs Reviewed: Na: 140 (02/12/2009)   K+: 4.2 (02/12/2009)   CL: 109 (02/12/2009)   HCO3: 24 (02/12/2009) Ca: 9.5 (02/12/2009)   TSH: 0.58 (02/12/2009)   HCO3: 24 (02/12/2009)  Complete Medication List: 1)  Atenolol 100 Mg Tabs (Atenolol) .... Take one tablet by mouth daily 2)  Depo-provera 150 Mg/ml Im Susp (Medroxyprogesterone acetate) .Marland Kitchen.. 1 injection every 3 months 3)  Oxybutynin Chloride 10 Mg Xr24h-tab (Oxybutynin chloride) .Marland Kitchen.. 1 by mouth once daily 4)  Prilosec 20 Mg Cpdr (Omeprazole) .Marland Kitchen.. 1 tab once daily 5)  Flexeril 10 Mg Tabs (Cyclobenzaprine hcl) .Marland Kitchen.. 1 by  mouth three times a day as needed. 6)  Anusol-hc 25 Mg Supp (Hydrocortisone acetate) .... Insert 1 suppository into rectum at bedtime 7)  Bentyl 20 Mg Tabs (Dicyclomine hcl) .... Take 1 tablet by mouth once daily  Other Orders: Venipuncture (09811) Specimen Handling (91478)  Patient Instructions: 1)  Please schedule a follow-up appointment in 2 weeks. ---  to remove warts and go over labs Prescriptions: PRILOSEC 20 MG  CPDR (OMEPRAZOLE) 1 tab once daily  #90 x 3   Entered and Authorized by:   Loreen Freud DO   Signed by:   Loreen Freud DO on 03/07/2010   Method used:   Faxed to ...       [address] [facility] (mail-order)       [address]       [place], [place]  [address]       Ph: [phone]       Fax: [phone]   RxID:   [id number] OXYBUTYNIN CHLORIDE 10 MG XR24H-TAB (OXYBUTYNIN CHLORIDE) 1 by mouth once daily  #90 x 3   Entered and Authorized by:   Loreen Freud DO   Signed by:   Loreen Freud DO on 03/07/2010   Method used:   Faxed to ...       [facility] (mail-order)       [address]       [place], [place]  [address]       Ph: [phone]       Fax: [phone]   RxID:   [id number] DEPO-PROVERA 150 MG/ML IM SUSP (MEDROXYPROGESTERONE ACETATE) 1 INJECTION EVERY 3 MONTHS  #1 x 3   Entered and Authorized by:   Loreen Freud DO   Signed by:   Loreen Freud DO on 03/07/2010   Method used:   Faxed to ...       [facility] (mail-order)       [address]       [place], [place]  [address]       Ph: [phone]       Fax: [phone]   RxID:   [id number] ATENOLOL 100 MG TABS (ATENOLOL) Take one tablet by mouth daily  #90 x 3   Entered and Authorized by:   Loreen Freud DO   Signed by:   Loreen Freud DO on 03/07/2010   Method used:   Faxed to ...       [facility] (mail-order)       [address]       [place], [place]  [address]       Ph: [phone]       Fax: [phone]   RxID:   [id number] DEPO-PROVERA 150 MG/ML IM SUSP (MEDROXYPROGESTERONE ACETATE) 1 INJECTION EVERY 3 MONTHS  #1 x  3   Entered and Authorized by:   Loreen Freud DO   Signed by:   Loreen Freud DO on 03/07/2010   Method used:   Print then Give to Patient   RxID:   [id number] ATENOLOL 100 MG TABS (ATENOLOL) Take one tablet by mouth daily  #90 x 3   Entered and Authorized by:   Loreen Freud DO   Signed by:   Loreen Freud DO on 03/07/2010   Method used:   Print then Give to Patient   RxID:   [id number] FLEXERIL 10 MG TABS (CYCLOBENZAPRINE HCL) 1 by mouth three times a day as needed.  #60 x 2   Entered and Authorized by:   Loreen Freud DO   Signed by:   Loreen Freud DO on 03/07/2010   Method used:   Electronically to        [facility]  [address] [phone]* (retail)       [address] [place], [place]  [address]       Ph: [phone] or [phone]       Fax: [phone]   RxID:   [id number]   Flu Vaccine Next Due:  Refused

## 2010-07-02 NOTE — Progress Notes (Signed)
Summary: Needs appt to discuss [facility] 11/2--pt called back  Phone Note Outgoing Call   Call placed by: Almeta Monas CMA Duncan Dull),  April 02, 2010 2:46 PM Call placed to: Patient Details for Reason: Needs appt to review labs Summary of Call: Left message to call back Patient needs an appt to Discuss [facility]......Marland Kitchen    Almeta Monas CMA Duncan Dull)  April 02, 2010 2:47 PM   Follow-up for Phone Call        has appt for 11/10 at 4:00pm..[facility]  April 03, 2010 11:29 AM  Additional Follow-up for Phone Call Additional follow up Details #1::        Phone call completed Additional Follow-up by: Almeta Monas CMA Duncan Dull),  April 03, 2010 3:42 PM

## 2010-07-02 NOTE — Assessment & Plan Note (Signed)
Summary: discuss [facility]///sph   Vital Signs:  Patient profile:   45 year old female Weight:      185 pounds Pulse rate:   60 / minute Pulse rhythm:   regular BP sitting:   112 / 64  (left arm) Cuff size:   large  Vitals Entered By: Almeta Monas CMA Duncan Dull) (April 10, 2010 3:56 PM) CC: Review [facility]   History of Present Illness: Pt here to review labs   Current Medications (verified): 1)  Atenolol 100 Mg Tabs (Atenolol) .... Take One Tablet By Mouth Daily 2)  Depo-Provera 150 Mg/ml Im Susp (Medroxyprogesterone Acetate) .Marland Kitchen.. 1 Injection Every 3 Months 3)  Oxybutynin Chloride 10 Mg Xr24h-Tab (Oxybutynin Chloride) .Marland Kitchen.. 1 By Mouth Once Daily 4)  Prilosec 20 Mg  Cpdr (Omeprazole) .Marland Kitchen.. 1 Tab Once Daily 5)  Flexeril 10 Mg Tabs (Cyclobenzaprine Hcl) .Marland Kitchen.. 1 By Mouth Three Times A Day As Needed. 6)  Anusol-Hc 25 Mg Supp (Hydrocortisone Acetate) .... Insert 1 Suppository Into Rectum At Bedtime 7)  Bentyl 20 Mg Tabs (Dicyclomine Hcl) .... Take 1 Tablet By Mouth Once Daily 8)  Simcor 1000-40 Mg Xr24h-Tab (Niacin-Simvastatin) .Marland Kitchen.. 1 By Mouth At Bedtime  Allergies (verified): 1)  ! Feldene (Piroxicam)  Past History:  Past Medical History: Last updated: 07/12/2009 Hypertension with hyptertensive heart disease, grade 1 diastolic dysfunction.  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 Depression Kidney Stones Obesity Urinary Tract Infection Hiatal hernia  Past Surgical History: Last updated: 02/27/2009 Tonsillectomy LEEP SURGERY-MID 20'S    Current Problems:  HIATAL HERNIA WITH REFLUX, HX OF (ICD-V12.79) OTHER SPECIFIED CONTRACEPTIVE MANAGEMENT (ICD-V25.8) CHOLECYSTITIS - ACUTE (ICD-575.0) * CONTRACEPTIVE MANAGEMENT TOBACCO ABUSE (ICD-305.1) ALLERGIC URTICARIA (ICD-708.0) TINEA CORPORIS (ICD-110.5) PREVENTIVE HEALTH CARE (ICD-V70.0) FAMILY HISTORY BREAST CANCER 1ST DEGREE RELATIVE <50 (ICD-V16.3) UNSPECIFIED CONTRACEPTIVE MANAGEMENT  (ICD-V25.9) GERD (ICD-530.81) DEPRESSION (ICD-311) PREVENTIVE HEALTH CARE (ICD-V70.0) THYROID NODULE, HX OF (ICD-V12.2) HEMATURIA (ICD-599.7) ANOMALY, 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) HYPERTENSION (ICD-401.9) FAMILY HISTORY DIABETES 1ST DEGREE RELATIVE (ICD-V18.0)  Family History: Last updated: 03/07/2010 FATHER: LIVING, no cardiac problems 1 BROTHER: LIVING Family History Hypertension; BOTH SIDES OF FAMILY Grandmother had MI age 22 Family History Lung cancer: PGM, MGM Family History Diabetes 1st degree relative: MOTHER, PGM, AUNT(FATHERS SIDE) Family History Thyroid disease: MOTHER Family History Breast cancer 1st degree relative --43yo Family History of Breast Cancer:Mother No FH of Colon Cancer: Family History of Colon Polyps:Mother Family History of Diabetes: Mother Family History of Irritable Bowel Syndrome:Mother Family History of Liver Disease/Cirrhosis:Mother Family History of CAD Female 1st degree relative 43yo  Social History: Last updated: 02/27/2009 Occupation: [employer]  Single Former tobacco use-1 ppd, none since February 2010 Alcohol use-no--Hx alcoholsim-- detox 7/13-7/17/07 Drug use-no Regular exercise-no  Risk Factors: Alcohol Use: 0 (03/07/2010) Caffeine Use: 0 (03/07/2010) Diet: weight watchers  (03/07/2010) Exercise: no (03/07/2010)  Risk Factors: Smoking Status: quit > 6 months (03/07/2010) Packs/Day: 1.0 (03/07/2010) Cans of tobacco/wk: no (03/07/2010) Passive Smoke Exposure: no (03/07/2010)  Family History: Reviewed history from 03/07/2010 and no changes required. FATHER: LIVING, no cardiac problems 1 BROTHER: LIVING Family History Hypertension; BOTH SIDES OF FAMILY Grandmother had MI age 53 Family History Lung cancer: PGM, MGM Family History Diabetes 1st degree relative: MOTHER, PGM, AUNT(FATHERS SIDE) Family History Thyroid disease: MOTHER Family History Breast cancer 1st degree relative --43yo Family History of Breast Cancer:Mother No  FH of Colon Cancer: Family History of Colon Polyps:Mother Family History of Diabetes: Mother Family History of Irritable Bowel Syndrome:Mother Family History of Liver Disease/Cirrhosis:Mother Family  History of CAD Female 1st degree relative 43yo  Social History: Reviewed history from 02/27/2009 and no changes required. Occupation: [employer]  Single Former tobacco use-1 ppd, none since February 2010 Alcohol use-no--Hx alcoholsim-- detox 7/13-7/17/07 Drug use-no Regular exercise-no  Review of Systems      See HPI  Physical Exam  General:  Well-developed,well-nourished,in no acute distress; alert,appropriate and cooperative throughout examination Psych:  Cognition and judgment appear intact. Alert and cooperative with normal attention span and concentration. No apparent delusions, illusions, hallucinations   Impression & Recommendations:  Problem # 1:  HYPERLIPIDEMIA, BORDERLINE HDL (ICD-272.4)  Her updated medication list for this problem includes:    Simcor 1000-40 Mg Xr24h-tab (Niacin-simvastatin) .Marland Kitchen... 1 by mouth at bedtime  Labs Reviewed: SGOT: 24 (08/23/2009)   SGPT: 32 (08/23/2009)   HDL:48.60 (08/23/2009), 37.70 (02/12/2009)  LDL:118 (08/23/2009), 118 (02/12/2009)  Chol:183 (08/23/2009), 171 (02/12/2009)  Trig:82.0 (08/23/2009), 78.0 (02/12/2009)  Complete Medication List: 1)  Atenolol 100 Mg Tabs (Atenolol) .... Take one tablet by mouth daily 2)  Depo-provera 150 Mg/ml Im Susp (Medroxyprogesterone acetate) .Marland Kitchen.. 1 injection every 3 months 3)  Oxybutynin Chloride 10 Mg Xr24h-tab (Oxybutynin chloride) .Marland Kitchen.. 1 by mouth once daily 4)  Prilosec 20 Mg Cpdr (Omeprazole) .Marland Kitchen.. 1 tab once daily 5)  Flexeril 10 Mg Tabs (Cyclobenzaprine hcl) .Marland Kitchen.. 1 by mouth three times a day as needed. 6)  Anusol-hc 25 Mg Supp (Hydrocortisone acetate) .... Insert 1 suppository into rectum at bedtime 7)  Bentyl 20 Mg Tabs (Dicyclomine hcl) .... Take 1 tablet by mouth once daily 8)  Simcor 1000-40  Mg Xr24h-tab (Niacin-simvastatin) .Marland Kitchen.. 1 by mouth at bedtime  Patient Instructions: 1)  repeat labs 3 month---[facility]  272.4---ov 3 -4 weeks after labs Prescriptions: SIMCOR 1000-40 MG XR24H-TAB (NIACIN-SIMVASTATIN) 1 by mouth at bedtime  #30 x 1   Entered and Authorized by:   Loreen Freud DO   Signed by:   Loreen Freud DO on 04/10/2010   Method used:   Print then Give to Patient   RxID:   [id number]    Orders Added: 1)  Est. Patient Level III [14782]

## 2010-07-02 NOTE — Assessment & Plan Note (Signed)
Summary: REMOVE WART/REVIEW LAB/CBS   Vital Signs:  Patient profile:   45 year old female Weight:      185.6 pounds Temp:     97.2 degrees F oral Pulse rate:   44 / minute Pulse rhythm:   regular BP sitting:   118 / 60  (right arm) Cuff size:   large  Vitals Entered By: Almeta Monas CMA Duncan Dull) (March 21, 2010 2:20 PM) CC: wart removal from the right middle finger   History of Present Illness: Pt here for wart removal on R hand.  Current Medications (verified): 1)  Atenolol 100 Mg Tabs (Atenolol) .... Take One Tablet By Mouth Daily 2)  Depo-Provera 150 Mg/ml Im Susp (Medroxyprogesterone Acetate) .Marland Kitchen.. 1 Injection Every 3 Months 3)  Oxybutynin Chloride 10 Mg Xr24h-Tab (Oxybutynin Chloride) .Marland Kitchen.. 1 By Mouth Once Daily 4)  Prilosec 20 Mg  Cpdr (Omeprazole) .Marland Kitchen.. 1 Tab Once Daily 5)  Flexeril 10 Mg Tabs (Cyclobenzaprine Hcl) .Marland Kitchen.. 1 By Mouth Three Times A Day As Needed. 6)  Anusol-Hc 25 Mg Supp (Hydrocortisone Acetate) .... Insert 1 Suppository Into Rectum At Bedtime 7)  Bentyl 20 Mg Tabs (Dicyclomine Hcl) .... Take 1 Tablet By Mouth Once Daily  Allergies (verified): 1)  ! Feldene (Piroxicam)   Complete Medication List: 1)  Atenolol 100 Mg Tabs (Atenolol) .... Take one tablet by mouth daily 2)  Depo-provera 150 Mg/ml Im Susp (Medroxyprogesterone acetate) .Marland Kitchen.. 1 injection every 3 months 3)  Oxybutynin Chloride 10 Mg Xr24h-tab (Oxybutynin chloride) .Marland Kitchen.. 1 by mouth once daily 4)  Prilosec 20 Mg Cpdr (Omeprazole) .Marland Kitchen.. 1 tab once daily 5)  Flexeril 10 Mg Tabs (Cyclobenzaprine hcl) .Marland Kitchen.. 1 by mouth three times a day as needed. 6)  Anusol-hc 25 Mg Supp (Hydrocortisone acetate) .... Insert 1 suppository into rectum at bedtime 7)  Bentyl 20 Mg Tabs (Dicyclomine hcl) .... Take 1 tablet by mouth once daily  Other Orders: Wart Destruct <14 (17110)   Orders Added: 1)  Wart Destruct <14 [17110]    Procedure Note Last Tetanus: Tdap (01/21/2007)  Wart Removal: Onset of lesion: > 3  months Indication: inflamed lesion Consent signed: yes  Procedure # 1: cryotherapy    Region: dorsal    Location: R index finger    # lesions removed: 1    Technique: liquid N2  Additional Instructions: rto 1 month if needed

## 2010-07-02 NOTE — Assessment & Plan Note (Signed)
Summary: per check out/sf   Visit Type:  2 mo f/u Referring Provider:  Loreen Freud DO Primary Provider:  Loreen Freud DO  CC:  pt states she has been getting pains in the anterior part of her neck on the right side..she also has pain right posterior part of her neck as well that she said she takes pain meds for but this is the first time the pain has been in the front..no other complaints .  History of Present Illness: 45 yo WF with history of PVCs, HTN, former tobacco abuse and former alcohol abuse seen initially in September with dizziness and palpitations. She was found to have normal LV systolic function with mild diastolic dysfunction and mild MR. She has been feeling better on atenolol. NO dizziness or palpitations. She overall is feeling great. Has had some musculoskeletal pain in her right neck while moving her arm at work. Also, sharp, mild left sided chest pains while at rest several times per week that last for 2-3 seconds. She has not started exercising. She has tried to alter her diet.   Current Medications (verified): 1)  Atenolol 100 Mg Tabs (Atenolol) .... Take One Tablet By Mouth Daily 2)  Depo-Provera 150 Mg/ml Im Susp (Medroxyprogesterone Acetate) .Marland Kitchen.. 1 Injection Every 3 Months 3)  Vesicare 10 Mg Tabs (Solifenacin Succinate) .Marland Kitchen.. 1 Tab By Mouth Daily 4)  Prilosec 20 Mg  Cpdr (Omeprazole) .Marland Kitchen.. 1 Twice A Day 30 Minutes Before Meals 5)  Flexeril 10 Mg Tabs (Cyclobenzaprine Hcl) .Marland Kitchen.. 1 By Mouth Three Times A Day As Needed. 6)  Bayer Aspirin 325 Mg Tabs (Aspirin) .... Occasional 7)  Antivert 25 Mg Tabs (Meclizine Hcl) .Marland Kitchen.. 1 Tablet Q6 Hours As Needed For Dizziness. 8)  Anusol-Hc 25 Mg Supp (Hydrocortisone Acetate) .... Insert 1 Suppository Into Rectum At Bedtime 9)  Bentyl 20 Mg Tabs (Dicyclomine Hcl) .... Take 1 Tablet By Mouth Once Daily  Allergies: 1)  ! Feldene (Piroxicam)  Past History:  Past Medical History: Hypertension with hyptertensive heart disease, grade 1  diastolic dysfunction.  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 Occupation: [employer]  Single Former tobacco use-1 ppd, none since February 2010 Alcohol use-no--Hx alcoholsim-- detox 7/13-7/17/07 Drug use-no Regular exercise-no  Review of Systems       The patient complains of chest pain and joint pain.  The patient denies fatigue, malaise, weight gain/loss, vision loss, decreased hearing, hoarseness, palpitations, shortness of breath, prolonged cough, wheezing, sleep apnea, coughing up blood, abdominal pain, blood in stool, nausea, vomiting, diarrhea, heartburn, incontinence, blood in urine, muscle weakness, leg swelling, rash, skin lesions, headache, fainting, dizziness, depression, anxiety, enlarged lymph nodes, easy bruising or bleeding, and environmental allergies.    Vital Signs:  Patient profile:   45 year old female Height:      65 inches Weight:      213 pounds BMI:     35.57 Pulse rate:   52 / minute Pulse rhythm:   regular BP sitting:   116 / 74  (left arm) Cuff size:   large  Vitals Entered By: Danielle Rankin, CMA (July 12, 2009 8:39 AM)  Physical Exam  General:  General: Well developed, well nourished, NAD Psychiatric: Mood and affect normal Neck: No JVD, no carotid bruits, no thyromegaly, no lymphadenopathy. Lungs:Clear bilaterally, no wheezes, rhonci, crackles CV: Bradycardic. No murmurs, gallops rubs Abdomen: soft, NT, ND, BS  present Extremities: No edema, pulses 2+.    Impression & Recommendations:  Problem # 1:  PREMATURE VENTRICULAR CONTRACTIONS (ICD-427.69) Controlled on beta blocker therapy.   Her updated medication list for this problem includes:    Atenolol 100 Mg Tabs (Atenolol) .Marland Kitchen... Take one tablet by mouth daily    Bayer Aspirin 325 Mg Tabs (Aspirin) ..... Occasional  Problem  # 2:  HYPERTENSION (ICD-401.9) BP well controlled. Continue current therapy. Diet and weight loss with daily exercise encouraged.   Her updated medication list for this problem includes:    Atenolol 100 Mg Tabs (Atenolol) .Marland Kitchen... Take one tablet by mouth daily    Bayer Aspirin 325 Mg Tabs (Aspirin) ..... Occasional  Problem # 3:  HYPERTENSION, HEART CONTROLLED W/O ASSOC CHF (ICD-402.10) Mild diastolic dysfunction. Volume ok. BP well controlled.   Her updated medication list for this problem includes:    Atenolol 100 Mg Tabs (Atenolol) .Marland Kitchen... Take one tablet by mouth daily    Bayer Aspirin 325 Mg Tabs (Aspirin) ..... Occasional  Patient Instructions: 1)  Your physician recommends that you schedule a follow-up appointment in: 6 months 2)  Your physician recommends that you continue on your current medications as directed. Please refer to the Current Medication list given to you today.

## 2010-07-02 NOTE — Assessment & Plan Note (Signed)
Summary: f/u//ca   Vital Signs:  Patient Profile:   45 Years Old Female Height:     65.75 inches Weight:      181 pounds Temp:     98.5 degrees F oral Pulse rate:   72 / minute Resp:     18 per minute BP sitting:   130 / 80  (right arm)  Pt. in pain?   no  Vitals Entered By: Ardyth Man (September 02, 2007 11:04 AM)                  PCP:  Laury Axon  Chief Complaint:  BP Check and Abdominal pain.  History of Present Illness:  Hypertension Follow-Up      This is a 45 year old woman who presents for Hypertension follow-up.  The patient denies lightheadedness, urinary frequency, headaches, edema, impotence, rash, and fatigue.  The patient denies the following associated symptoms: chest pain, chest pressure, exercise intolerance, dyspnea, palpitations, syncope, leg edema, and pedal edema.  Compliance with medications (by patient report) has been near 100%.  The patient reports that dietary compliance has been good.  The patient reports no exercise.  Adjunctive measures currently used by the patient include salt restriction.    Abdominal Pain      The patient also presents with Abdominal pain.  The patient denies nausea, vomiting, diarrhea, constipation, melena, hematochezia, anorexia, and hematemesis.  The location of the pain is epigastric.  The pain is described as intermittent.  The patient denies the following symptoms: fever, weight loss, dysuria, chest pain, jaundice, dark urine, missed menstrual period, and vaginal bleeding.  The pain is worse with food.  The pain is better with antacids and an H2 blocker.      Current Allergies: No known allergies   Past Medical History:    Reviewed history from 02/24/2007 and no changes required:       Hypertension       recovering alcoholic-- 10 months sober       Depression   Family History:    Reviewed history from 01/21/2007 and no changes required:       MOTHER: LIVING       FATHER: LIVING        1 BROTHER: LIVING       Family  History Hypertension; BOTH SIDES OF FAMILY              Family History Lung cancer: PGM, MGM       Family History Diabetes 1st degree relative: MOTHER, PGM, AUNT(FATHERS SIDE)              Family History Thyroid disease: MOTHER  Social History:    Reviewed history from 02/24/2007 and no changes required:       Occupation: [employer]        Single       Current Smoker       Alcohol use-no--Hx alcoholsim-- detox 7/13-7/17/07       Drug use-no       Regular exercise-no    Review of Systems      See HPI   Physical Exam  General:     Well-developed,well-nourished,in no acute distress; alert,appropriate and cooperative throughout examination Lungs:     Normal respiratory effort, chest expands symmetrically. Lungs are clear to auscultation, no crackles or wheezes. Heart:     Normal rate and regular rhythm. S1 and S2 normal without gallop, murmur, click, rub or other extra sounds. Abdomen:  Bowel sounds positive,abdomen soft and non-tender without masses, organomegaly or hernias noted. Extremities:     No clubbing, cyanosis, edema, or deformity noted with normal full range of motion of all joints.   Psych:     Cognition and judgment appear intact. Alert and cooperative with normal attention span and concentration. No apparent delusions, illusions, hallucinations    Impression & Recommendations:  Problem # 1:  HYPERTENSION (ICD-401.9) check labs---  rto for cpe Her updated medication list for this problem includes:    Atenolol 50 Mg Tabs (Atenolol) .Marland Kitchen... 1 by mouth once daily  BP today: 130/80 Prior BP: 124/88 (01/21/2007)  Labs Reviewed: Creat: 0.8 (01/28/2007) Chol: 199 (01/28/2007)   HDL: 35.7 (01/28/2007)   LDL: 146 (01/28/2007)   TG: 86 (01/28/2007)   Problem # 2:  GERD (ICD-530.81) prilosec OTC call or rto prn Diagnostics Reviewed:  Discussed lifestyle modifications, diet, antacids/medications, and preventive measures.   Complete Medication List: 1)   Atenolol 50 Mg Tabs (Atenolol) .Marland Kitchen.. 1 by mouth once daily 2)  Depo-provera 150 Mg/ml Im Susp (Medroxyprogesterone acetate) .Marland Kitchen.. 1 injection every 3 months   Patient Instructions: 1)  401.9  lipid, bmp, hep    fasting labs    Prescriptions: ATENOLOL 50 MG  TABS (ATENOLOL) 1 by mouth once daily  #90 x 3   Entered and Authorized by:   Loreen Freud DO   Signed by:   Loreen Freud DO on 09/02/2007   Method used:   Electronically sent to ...       [facility]*       [address] [place], [place]  [address]       Ph: [phone]       Fax: [phone]   RxID:   [id number] ATENOLOL 50 MG  TABS (ATENOLOL) 1 by mouth once daily  #90 x 3   Entered and Authorized by:   Loreen Freud DO   Signed by:   Loreen Freud DO on 09/02/2007   Method used:   Print then Give to Patient   RxID:   [id number]  ]

## 2010-07-02 NOTE — Assessment & Plan Note (Signed)
Summary: 6 month follow up/401.1/pla   Visit Type:  6 mo f/u Referring Camara Renstrom:  Loreen Freud DO Primary Jairen Goldfarb:  Loreen Freud DO  CC:  no cardiac complaints today.  History of Present Illness: 45 yo WF with history of PVCs, HTN, former tobacco abuse and former alcohol abuse seen initially in September 2010 for  dizziness and palpitations. She was found to have normal LV systolic function with mild diastolic dysfunction and mild MR. At her last visit in February 2011, she was feeling much better.  She has lost 13 pounds since her last visit. She has had no chest pain, SOB or lower extremity edema. She has occasional palpitations that are for a few seconds. She is tolerating her beta blocker without any dizziness or near syncope. She is working full time and enjoying life.   Current Medications (verified): 1)  Atenolol 100 Mg Tabs (Atenolol) .... Take One Tablet By Mouth Daily 2)  Depo-Provera 150 Mg/ml Im Susp (Medroxyprogesterone Acetate) .Marland Kitchen.. 1 Injection Every 3 Months 3)  Vesicare 10 Mg Tabs (Solifenacin Succinate) .Marland Kitchen.. 1 Tab By Mouth Daily 4)  Prilosec 20 Mg  Cpdr (Omeprazole) .Marland Kitchen.. 1 Tab Once Daily 5)  Flexeril 10 Mg Tabs (Cyclobenzaprine Hcl) .Marland Kitchen.. 1 By Mouth Three Times A Day As Needed. 6)  Anusol-Hc 25 Mg Supp (Hydrocortisone Acetate) .... Insert 1 Suppository Into Rectum At Bedtime 7)  Bentyl 20 Mg Tabs (Dicyclomine Hcl) .... Take 1 Tablet By Mouth Once Daily  Allergies: 1)  ! Feldene (Piroxicam)  Past History:  Past Medical History: Reviewed history from 07/12/2009 and no changes required. Hypertension with hyptertensive heart disease, grade 1 diastolic dysfunction.  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 Occupation: [employer]  Single Former tobacco use-1 ppd, none since February 2010 Alcohol use-no--Hx  alcoholsim-- detox 7/13-7/17/07 Drug use-no Regular exercise-no  Review of Systems  The patient denies fatigue, malaise, fever, weight gain/loss, vision loss, decreased hearing, hoarseness, chest pain, palpitations, shortness of breath, prolonged cough, wheezing, sleep apnea, coughing up blood, abdominal pain, blood in stool, nausea, vomiting, diarrhea, heartburn, incontinence, blood in urine, muscle weakness, joint pain, leg swelling, rash, skin lesions, headache, fainting, dizziness, depression, anxiety, enlarged lymph nodes, easy bruising or bleeding, and environmental allergies.    Vital Signs:  Patient profile:   45 year old female Height:      65 inches Weight:      203 pounds BMI:     33.90 Pulse rate:   46 / minute Pulse rhythm:   irregular BP sitting:   118 / 60  (left arm) Cuff size:   large  Vitals Entered By: Danielle Rankin, CMA (January 06, 2010 11:23 AM)  Physical Exam  General:  General: Well developed, well nourished, NAD Psychiatric: Mood and affect normal Neck: No JVD, no carotid bruits, no thyromegaly, no lymphadenopathy. Lungs:Clear bilaterally, no wheezes, rhonci, crackles CV: RRR no murmurs, gallops rubs Abdomen: soft, NT, ND, BS present Extremities: No edema, pulses 2+.    EKG  Procedure date:  01/06/2010  Findings:      Sinus bradycardia, rate 46 bpm.   Impression & Recommendations:  Problem # 1:  HYPERTENSION, HEART CONTROLLED W/O ASSOC CHF (ICD-402.10) BP is well controlled. Continue current therapy. She has grade 1 diastolic dysfunction on echo.   The following medications were removed from the medication list:    Bayer Aspirin 325 Mg  Tabs (Aspirin) ..... Occasional Her updated medication list for this problem includes:    Atenolol 100 Mg Tabs (Atenolol) .Marland Kitchen... Take one tablet by mouth daily  The following medications were removed from the medication list:    Bayer Aspirin 325 Mg Tabs (Aspirin) ..... Occasional Her updated medication list for  this problem includes:    Atenolol 100 Mg Tabs (Atenolol) .Marland Kitchen... Take one tablet by mouth daily  Problem # 2:  PREMATURE VENTRICULAR CONTRACTIONS (ICD-427.69) Resolved on beta blocker therapy. No changes.   Patient Instructions: 1)  Your physician recommends that you schedule a follow-up appointment in: 12 months 2)  Your physician recommends that you continue on your current medications as directed. Please refer to the Current Medication list given to you today.

## 2010-07-02 NOTE — Progress Notes (Signed)
Summary: ---Lab Result--  Phone Note Outgoing Call   Call placed by: Jeremy Johann CMA,  April 23, 2010 3:39 PM Details for Reason: Chlamydia and GC Probe Amp, Urine--NEGATIVE                           Summary of Call: left message to call office ..................Marland KitchenFelecia Deloach CMA  April 23, 2010 3:39 PM   Pt aware..................Marland KitchenFelecia Deloach CMA  April 23, 2010 3:48 PM

## 2010-07-02 NOTE — Miscellaneous (Signed)
Summary: Consent to Special Procedure  Consent to Special Procedure   Imported By: Lanelle Bal 03/31/2010 12:42:22  _____________________________________________________________________  External Attachment:    Type:   Image     Comment:   External Document

## 2010-07-03 NOTE — Progress Notes (Signed)
Summary: med question-LVMTCB.**rtn call**  Phone Note Call from Patient   Caller: Patient [phone] Reason for Call: Talk to Nurse Summary of Call: pt has med questions Initial call taken by: Glynda Jaeger,  May 15, 2010 3:37 PM  Follow-up for Phone Call        LVMTCB Whitney Maeola Sarah RN  May 15, 2010 3:49 Pm  Pt.'s PCP put her on Simcor for her cholesterol and she was feeling flushed. She was wondering if she should be worried that this medication would cause her to pass out since she already has a low HR. I explained to her that this is a side effect of Simcor and I don't believe she will pass out from the s/e of feeling flushed but she should monitor her HR closely since she is on 100mg  of Atenolol daily. I also told her to call her PCP to see if they could switch her cholesterol medication since she is having this side effect. She agrees. Whitney Maeola Sarah RN  May 15, 2010 4:43 PM  Follow-up by: Whitney Maeola Sarah RN,  May 15, 2010 3:49 PM

## 2010-07-11 ENCOUNTER — Other Ambulatory Visit: Payer: Self-pay

## 2010-08-07 ENCOUNTER — Ambulatory Visit: Payer: Self-pay | Admitting: Family Medicine

## 2010-09-10 ENCOUNTER — Other Ambulatory Visit: Payer: Self-pay

## 2010-09-10 MED ORDER — MEDROXYPROGESTERONE ACETATE 150 MG/ML IM SUSP: 150.0000 mg | INTRAMUSCULAR | Status: DC

## 2010-10-16 ENCOUNTER — Encounter: Payer: Self-pay | Admitting: Family Medicine

## 2010-10-16 ENCOUNTER — Ambulatory Visit (INDEPENDENT_AMBULATORY_CARE_PROVIDER_SITE_OTHER): Payer: BC Managed Care – PPO | Admitting: Family Medicine

## 2010-10-16 VITALS — BP 122/80 | HR 51 | Temp 98.5°F | Wt 188.0 lb

## 2010-10-16 DIAGNOSIS — R599 Enlarged lymph nodes, unspecified: Secondary | ICD-10-CM | POA: Insufficient documentation

## 2010-10-16 DIAGNOSIS — F419 Anxiety disorder, unspecified: Secondary | ICD-10-CM

## 2010-10-16 DIAGNOSIS — F411 Generalized anxiety disorder: Secondary | ICD-10-CM

## 2010-10-16 DIAGNOSIS — L659 Nonscarring hair loss, unspecified: Secondary | ICD-10-CM

## 2010-10-16 DIAGNOSIS — L309 Dermatitis, unspecified: Secondary | ICD-10-CM | POA: Insufficient documentation

## 2010-10-16 DIAGNOSIS — E785 Hyperlipidemia, unspecified: Secondary | ICD-10-CM

## 2010-10-16 DIAGNOSIS — L259 Unspecified contact dermatitis, unspecified cause: Secondary | ICD-10-CM

## 2010-10-16 MED ORDER — ALPRAZOLAM 0.25 MG PO TABS: 0.2500 mg | ORAL_TABLET | Freq: Three times a day (TID) | ORAL | Status: DC | PRN

## 2010-10-16 NOTE — Assessment & Plan Note (Signed)
Use otc  Cortisone cream mixed with eucerin cream--use bid

## 2010-10-16 NOTE — Assessment & Plan Note (Signed)
Check thyroid panal Make sure eating enough protein rto prn

## 2010-10-16 NOTE — Assessment & Plan Note (Signed)
Resolved on its own--rto prn

## 2010-10-16 NOTE — Patient Instructions (Signed)
Alopecia Areata, Autoimmune Hair Loss  Alopecia areata is a self-destructing (autoimmune) disease that results in the loss of hair. In this condition your body's immune system attacks the hair follicle. The hair follicle is responsible for growing hair. Hair loss can occur on the scalp and other parts of the body. It usually starts as one or more small, round, smooth patches of hair loss. It occurs in males and females of all ages and races, but usually starts before age 30. The scalp is the most commonly affected area, but the beard or any hair-bearing site can be affected. This type of hair loss does not leave scars where the hair was lost.    Many people with alopecia areata only have a few patches of hair loss. In others, extensive patchy hair loss occurs. In a few people, all scalp hair is lost (alopecia totalis), or hair is lost from the entire scalp and body (alopecia universalis). No matter how widespread the hair loss, the hair follicles remain alive and are ready to resume normal hair production whenever they receive the correct signal. Hair re-growth may occur without treatment and can even restart after years of hair loss.    CAUSES  It is thought that something triggers the immune system to stop hair growth. It is not always known what the cause is. Some people have genetic markers that can increase the chance of developing alopecia areata. Alopecia areata often occurs in families whose members have had:   Asthma.    Hay fever.    Atopic eczema.    Some autoimmune diseases may also be a trigger, such as:    Thyroid disease.    Diabetes.    Rheumatoid arthritis.    Lupus erythematosus.    Vitiligo.    Pernicious anemia.    Addison's disease.   OTHER SYMPTOMS  In some people, the nail beds may develop rows of tiny dents (stippling) or the nail beds can become distorted. Other than the hair and nail beds, no other body part is affected.    PROGNOSIS     Alopecia areata is not medically disabling. People with alopecia areata are usually in excellent health. Hair loss can be emotionally difficult. The National Alopecia Areata Foundation has resources available to help individuals and families with alopecia areata. Their goal is to help people with the condition live full, productive lives. There are many successful, well-adjusted, contented people living with Alopecia areata. Alopecia areata can be overcome with:   A positive self image.    Sound medical facts.    The support of others, such as:    Sometimes professional counseling is helpful to develop one's self-confidence and positive self-image.   TREATMENT  There is no cure for alopecia areata. There are several available treatments. Treatments are most effective in milder cases. No treatment is effective for everyone. Choice of treatment depends mainly on a person's age and the extent of their hair loss.  Alopecia areata occurs in two forms:     A mild patchy form where less than 50 percent of scalp hair is lost.    An extensive form where greater than 50 percent of scalp hair is lost.   These two forms of alopecia areata behave quite differently, and the choice of treatment depends on which form is present. Current treatments do not turn alopecia areata off. They can stimulate the hair follicle to produce hair.    Some medications used to treat mild cases include:      Cortisone injections. The most common treatment is the injection of cortisone into the bare skin patches. The injections are usually given by a caregiver specializing in skin issues (dermatologist). This caregiver will use a tiny needle to give multiple injections into the skin in and around the bare patches. The injections are repeated once a month. If new hair growth occurs, it is usually visible within 4 weeks. Treatment does not prevent new patches of hair loss from developing. There are few side effects from local cortisone injections. Occasionally, temporary dents (depressions) in the skin result from the local injections, but these dents can fill in by themselves.    Topical minoxidil. Five percent topical minoxidil solution applied twice daily may grow hair in alopecia areata. Scalp, eyebrows, and beard hair may respond. If scalp hair re-grows completely, treatment can be stopped. Response may improve if topical cortisone cream is applied 30 minutes after the minoxidil. Topical minoxidil is safe, easy to use, and does not lower blood pressure in persons with normal blood pressure. Minoxidil can lead to unwanted facial hair growth in some people.    Anthralin cream or ointment. Another treatment is the application of anthralin cream or ointment. Anthralin is a tar-like substance that has been used widely for psoriasis. Anthralin is applied to the bare patches once daily. It is washed off after a short time, usually 30 to 60 minutes later. If new hair growth occurs, it is seen in 8 to 12 weeks. Anthralin can be irritating to the skin. It can cause temporary, brownish discoloration of the treated skin. By using short treatment times, skin irritation and skin staining are reduced without decreasing effectiveness. Care must be taken not to get anthralin in the eyes.   Some of the medications used for more extensive cases where there is greater than 50% hair loss include:   Cortisone pills. Cortisone pills are sometimes given for extensive scalp hair loss. Cortisone taken internally is much stronger than local injections of cortisone into the skin. It is necessary to discuss possible side effects of cortisone pills with your caregiver. In general, however, cortisone pills are used in relatively few patients with alopecia areata due to health risks from prolonged use. Also, hair that has grown is likely to fall out when the cortisone pills are stopped.    Topical minoxidil. See previous explanation under mild, patchy alopecia areata. However, Minoxidil is not effective in total loss of scalp hair (alopecia totalis).    Topical immunotherapy. Another method of treating alopecia areata or alopecia totalis/universalis involves producing an allergic rash or allergic contact dermatitis. Chemicals such as diphencyprone (DPCP) or squaric acid dibutyl ester (SADBE) are applied to the scalp to produce an allergic rash which resembles poison oak or ivy. Approximately 40% of patients treated with topical immunotherapy will re-grow scalp hair after about 6 months of treatment. Those who do successfully re-grow scalp hair will need to continue treatment to maintain hair re-growth.    Wigs. For extensive hair loss, a wig can be an important option for some people. Proper attention will make a quality wig look completely natural. A wig will need to be cut, thinned, and styled. To keep a net base wig from falling off, special double-sided tape can be purchased in beauty supply outlets and fastened to the inside of the wig.     For those with completely bare heads, there are suction caps to which any wig can be attached. There are also entire suction cap wig units. These state-of-the-art   wigs which make use of a silicon base to create a secure, vacuum-fit, are comfortable and easily removed by the wearer. Proper fit of a vacuum wig requires that any existing scalp hair be shaved. These wigs can be more expensive than other types of wigs.   For more information contact the National Alopecia Areata Foundation at:  [facility]  [address]  [place] [address]  Phone: [phone]  Fax: [phone]  www.naaf.org  E-mail: info@naaf.org  Document Released: 12/21/2003 Document Re-Released: 11/05/2009  [employer] Patient Information 2011 [employer].

## 2010-10-16 NOTE — Progress Notes (Signed)
  Subjective:    Patient ID: [name] Ramos, female    DOB: Oct 01, 1965, 45 y.o.   MRN: [id number]  HPI Pt here c/o hair loss and lump behind R ear.  She thinks there maybe something in her mouth causing the lump.  No fever, congestion etc.       Review of Systems  Constitutional: Negative.   Cardiovascular: Negative for chest pain and palpitations.  Hematological: Positive for adenopathy.       Objective:   Physical Exam  Constitutional: She is oriented to person, place, and time. She appears well-developed and well-nourished.  HENT:  Head: Normocephalic.  Mouth/Throat: Oropharynx is clear and moist. No oropharyngeal exudate.  Neck: Normal range of motion. Neck supple. No tracheal deviation present. No thyromegaly present.  Cardiovascular: Normal rate and normal heart sounds.   No murmur heard. Pulmonary/Chest: Effort normal and breath sounds normal. No respiratory distress. She has no wheezes. She has no rales. She exhibits no tenderness.  Lymphadenopathy:    She has no cervical adenopathy.  Neurological: She is alert and oriented to person, place, and time.  Skin: Skin is warm and dry. Rash noted.  Psychiatric: She has a normal mood and affect. Her behavior is normal.       Behind r ear+  Red, flaky skin          Assessment & Plan:

## 2010-10-17 ENCOUNTER — Encounter: Payer: Self-pay | Admitting: *Deleted

## 2010-10-17 LAB — CBC WITH DIFFERENTIAL/PLATELET
Basophils Relative: 0.7 % (ref 0.0–3.0)
Eosinophils Absolute: 0.1 10*3/uL (ref 0.0–0.7)
Lymphocytes Relative: 44.4 % (ref 12.0–46.0)
MCHC: 33.9 g/dL (ref 30.0–36.0)
MCV: 95.3 fl (ref 78.0–100.0)
Monocytes Absolute: 0.3 10*3/uL (ref 0.1–1.0)
Neutrophils Relative %: 45.6 % (ref 43.0–77.0)
Platelets: 194 10*3/uL (ref 150.0–400.0)
RBC: 4.06 Mil/uL (ref 3.87–5.11)
WBC: 4.2 10*3/uL — ABNORMAL LOW (ref 4.5–10.5)

## 2010-10-17 LAB — T4, FREE: Free T4: 0.88 ng/dL (ref 0.60–1.60)

## 2010-10-17 LAB — TSH: TSH: 0.89 u[IU]/mL (ref 0.35–5.50)

## 2010-10-21 NOTE — Progress Notes (Signed)
What happened to hep and lipid?

## 2010-10-22 LAB — LIPID PANEL
HDL: 64.7 mg/dL (ref >39.00)
LDL Cholesterol: 113 mg/dL — ABNORMAL HIGH (ref 0–99)
Total CHOL/HDL Ratio: 3
Triglycerides: 56 mg/dL (ref 0.0–149.0)

## 2010-10-22 LAB — HEPATIC FUNCTION PANEL
ALT: 30 U/L (ref 0–35)
Bilirubin, Direct: 0.1 mg/dL (ref 0.0–0.3)
Total Bilirubin: 0.3 mg/dL (ref 0.3–1.2)

## 2010-11-27 ENCOUNTER — Encounter: Payer: Self-pay | Admitting: Physician Assistant

## 2010-11-28 ENCOUNTER — Telehealth: Payer: Self-pay | Admitting: Cardiovascular Disease

## 2010-11-28 NOTE — Telephone Encounter (Signed)
Reviewed. cdm 

## 2010-11-28 NOTE — Telephone Encounter (Signed)
Pt seen in er for chest pain was told to call to have a stress test, wants to know if dr Clifton James wants this, and what kind? Does she need an appt too?

## 2010-11-28 NOTE — Telephone Encounter (Signed)
I spoke with pt who was in the ED at [facility] last pm until around midnight. She states she was having chest pain. She also states her ecg and lab work were normal but the ED MD recommended she see her cardiologist and have a stress test.  I have scheduled pt a 3pm appt on 12/01/10 with Tereso Newcomer PA. Pt says she has signed release at [facility] for records of last evening to be sent here.  She is painfree at this time and was told to call 911 if she should have recurrent chest pain. Pt understands. Mylo Red RN

## 2010-12-01 ENCOUNTER — Encounter: Payer: Self-pay | Admitting: Physician Assistant

## 2010-12-01 ENCOUNTER — Ambulatory Visit (INDEPENDENT_AMBULATORY_CARE_PROVIDER_SITE_OTHER): Payer: BC Managed Care – PPO | Admitting: Physician Assistant

## 2010-12-01 ENCOUNTER — Encounter: Payer: Self-pay | Admitting: *Deleted

## 2010-12-01 VITALS — BP 124/81 | HR 64 | Resp 16 | Ht 65.0 in | Wt 194.0 lb

## 2010-12-01 DIAGNOSIS — F3289 Other specified depressive episodes: Secondary | ICD-10-CM

## 2010-12-01 DIAGNOSIS — E785 Hyperlipidemia, unspecified: Secondary | ICD-10-CM

## 2010-12-01 DIAGNOSIS — I10 Essential (primary) hypertension: Secondary | ICD-10-CM

## 2010-12-01 DIAGNOSIS — I4949 Other premature depolarization: Secondary | ICD-10-CM

## 2010-12-01 DIAGNOSIS — F329 Major depressive disorder, single episode, unspecified: Secondary | ICD-10-CM

## 2010-12-01 DIAGNOSIS — R079 Chest pain, unspecified: Secondary | ICD-10-CM

## 2010-12-01 NOTE — Assessment & Plan Note (Signed)
Follow up with her PCP.  She should consider other options for treating anxiety or possibly being referred to counseling.

## 2010-12-01 NOTE — Telephone Encounter (Signed)
Patient seen in office today. 

## 2010-12-01 NOTE — Progress Notes (Signed)
History of Present Illness: Primary Cardiologist:  Dr. Verne Carrow  [name] Ramos is a 45 y.o. female who presents for post hospital follow up.  She has a history of PVCs, HTN, former tobacco abuse and former alcohol abuse seen initially in September 2010 for  dizziness and palpitations by Dr. Clifton James. She was found to have normal LV systolic function with mild diastolic dysfunction and mild MR on echo 9/10.  Last saw Dr. Clifton James in 12/2009.  She went to the emergency room at [facility] 6/28 with chest pain.  The pains are similar to what she's had in the past with her PVCs.  They've become more frequent recently.  She has nausea associated with them at times.  They're nonexertional.  She denies associated syncope or near syncope.  She does feel some bilateral arm discomfort.  She does feel some shortness of breath.  It is described as a pressure.  It lasts 2 minutes or less.  She denies any aggravating or alleviating factors.  She sleeps on one pillow.  She denies orthopnea, PND or edema.  She denies heavy caffeine use or over-the-counter cold medication use.  She denies any relationship to meals.  She denies pleuritic symptoms.  She denies any changes with changes in position.   The only thing that she has noticed recently is increased stress.   Past Medical History  Diagnosis Date  . Hypertension   . Alcohol abuse     recovering alcoholic  . Depression   . Ex-smoker   . Anxiety   . Dyslipidemia     Followed by PCP  . PVC's (premature ventricular contractions)     Controlled on beta blocker therapy;  Echo 9/10: EF 55-60%, grade 1 diastolic dysfunction, mild MR, mild LAE    Current Outpatient Prescriptions  Medication Sig Dispense Refill  . ALPRAZolam (XANAX) 0.25 MG tablet Take 1 tablet (0.25 mg total) by mouth 3 (three) times daily as needed for anxiety.  30 tablet  0  . aspirin 325 MG tablet Take 325 mg by mouth daily.        Marland Kitchen atenolol (TENORMIN) 100 MG tablet  Take 100 mg by mouth daily.        . medroxyPROGESTERone (DEPO-PROVERA) 150 MG/ML injection Inject 1 mL (150 mg total) into the muscle every 3 (three) months.  1 mL  1  . Niacin-Simvastatin [facility]) 1000-40 MG TB24 Take 1 tablet by mouth at bedtime.        Marland Kitchen omeprazole (PRILOSEC) 20 MG capsule Take 20 mg by mouth daily.        Marland Kitchen oxybutynin (DITROPAN-XL) 10 MG 24 hr tablet Take 10 mg by mouth daily.        Marland Kitchen DISCONTD: dicyclomine (BENTYL) 20 MG tablet Take 20 mg by mouth every 6 (six) hours.        Marland Kitchen DISCONTD: hydrocortisone (ANUSOL-HC) 25 MG suppository Place 25 mg rectally 2 (two) times daily.          Allergies: Allergies  Allergen Reactions  . Piroxicam     REACTION: hives    Social history:  Ex-smoker.  Recovering alcoholic.  Lives with her parents.  Family history:  Paternal grandmother had CAD.  ROS:  See history of present illness.  Denies fevers, chills, cough, melena, hematochezia.  She has gained about 15 pounds recently due to poor diet.  All other systems reviewed and negative.  Vital Signs: BP 124/81  Pulse 64  Resp 16  Ht 5\' 5"  (1.651  m)  Wt 194 lb (87.998 kg)  BMI 32.28 kg/m2  PHYSICAL EXAM: Well nourished, well developed, in no acute distress HEENT: normal Neck: no JVD Endocrine: No thyromegaly Vascular: No carotid bruits Cardiac:  normal S1, S2; RRR; no murmur Lungs:  clear to auscultation bilaterally, no wheezing, rhonchi or rales Abd: soft, nontender, no hepatomegaly Ext: no edema Skin: warm and dry Neuro:  CNs 2-12 intact, no focal abnormalities noted Psych: Normal affect  EKG:  Sinus bradycardia, rate 58, normal axis, no acute changes  ASSESSMENT AND PLAN:

## 2010-12-01 NOTE — Assessment & Plan Note (Signed)
Controlled.  Continue current therapy.  

## 2010-12-01 NOTE — Assessment & Plan Note (Signed)
Atypical.  This seems to be related to her PVCs.  It has increased in frequency recently in the context of increased anxiety.  Her ECG is normal.  She has few cardiac risk factors.  I reviewed her labs from [facility].  Her hemoglobin was normal.  Her creatinine was normal as was her potassium and BNP.  D-dimer was negative.  Cardiac markers were negative.  Chest x-ray was also negative.  I have recommended that we proceed with a plain old exercise treadmill test.  I will try to set this up with Dr. Clifton James since she has not seen him in quite some time.  If this is negative, then other causes of her chest pain can be evaluated.

## 2010-12-01 NOTE — Assessment & Plan Note (Signed)
Heart rate would not tolerate increasing her beta blocker any further.  Therefore, no changes will be made.

## 2010-12-01 NOTE — Assessment & Plan Note (Signed)
Managed by PCP

## 2010-12-01 NOTE — Patient Instructions (Signed)
Your physician has requested that you have an exercise tolerance test 786.50 PLEASE TRY TO SET THIS UP WITH DR. Clifton James, IF NO AVAILABILITY THEN SET UP WITH SCOTT WEAVER, PA-C, ; WILL ALSO NEED FOLLOW UP APPOINTMENT WITH DR. Clifton James AFTER TEST. For further information please visit [email or web address] Please also follow instruction sheet, as given.

## 2010-12-11 ENCOUNTER — Encounter: Payer: Self-pay | Admitting: Cardiovascular Disease

## 2010-12-15 ENCOUNTER — Ambulatory Visit (INDEPENDENT_AMBULATORY_CARE_PROVIDER_SITE_OTHER): Payer: BC Managed Care – PPO | Admitting: Family

## 2010-12-15 ENCOUNTER — Encounter: Payer: Self-pay | Admitting: Family

## 2010-12-15 DIAGNOSIS — H00014 Hordeolum externum left upper eyelid: Secondary | ICD-10-CM | POA: Insufficient documentation

## 2010-12-15 DIAGNOSIS — H00019 Hordeolum externum unspecified eye, unspecified eyelid: Secondary | ICD-10-CM

## 2010-12-15 MED ORDER — CEPHALEXIN 500 MG PO CAPS: 500.0000 mg | ORAL_CAPSULE | Freq: Four times a day (QID) | ORAL | Status: AC

## 2010-12-15 NOTE — Progress Notes (Signed)
Subjective:    Patient ID: [name] Ramos, female    DOB: December 31, 1965, 45 y.o.   MRN: [id number]  HPI  [name] Ramos is a 45 yr old female who presents today with chief complaint of eyelid swelling.  Symptoms started this AM.  Notes some tearing, but no associated fever or sore throat. She denies current visual problems.  Has not tried any medications or remedies.  Review of Systems See HPI  Past Medical History  Diagnosis Date  . Hypertension   . Alcohol abuse     recovering alcoholic  . Depression   . Ex-smoker   . Anxiety   . Dyslipidemia     Followed by PCP  . PVC's (premature ventricular contractions)     Controlled on beta blocker therapy;  Echo 9/10: EF 55-60%, grade 1 diastolic dysfunction, mild MR, mild LAE  . Hiatal hernia     with reflux  . Cholecystitis, acute   . Obesity   . Urinary tract infection   . Tinea corporis   . Preventative health care   . GERD (gastroesophageal reflux disease)   . Thyroid nodule     hx  . Hematuria   . Congenital anomaly of neck   . Unspecified contraceptive management   . Allergic urticaria   . Kidney stone     History   Social History  . Marital Status: Single    Spouse Name: N/A    Number of Children: N/A  . Years of Education: N/A   Occupational History  . Not on file.   Social History Main Topics  . Smoking status: Former Smoker -- 1.0 packs/day    Types: Cigarettes    Quit date: 07/02/2008  . Smokeless tobacco: Never Used  . Alcohol Use: No  . Drug Use: No  . Sexually Active: Not on file   Other Topics Concern  . Not on file   Social History Narrative  . No narrative on file    Past Surgical History  Procedure Date  . Tonsillectomy   . Leep surgery     mid 20's    Family History  Problem Relation Age of Onset  . Diabetes Mother   . Liver disease Mother   . Cirrhosis Mother   . Irritable bowel syndrome Mother   . Colon polyps Mother   . Breast cancer Mother   . Thyroid disease Mother   .  Lung cancer Maternal Uncle   . Lung cancer Maternal Grandmother   . Heart attack Maternal Grandmother 48  . Diabetes Maternal Grandmother   . Lung cancer Paternal Grandmother   . Breast cancer Other     1st degree relative--43 yo  . Coronary artery disease Other     female 1st degree relative 43 yo  . Hypertension Other     Allergies  Allergen Reactions  . Piroxicam     REACTION: hives    Current Outpatient Prescriptions on File Prior to Visit  Medication Sig Dispense Refill  . ALPRAZolam (XANAX) 0.25 MG tablet Take 1 tablet (0.25 mg total) by mouth 3 (three) times daily as needed for anxiety.  30 tablet  0  . aspirin 325 MG tablet Take 325 mg by mouth daily.        Marland Kitchen atenolol (TENORMIN) 100 MG tablet Take 100 mg by mouth daily.        . medroxyPROGESTERone (DEPO-PROVERA) 150 MG/ML injection Inject 1 mL (150 mg total) into the muscle every 3 (three) months.  1 mL  1  . Niacin-Simvastatin (SIMCOR) 1000-40 MG TB24 Take 1 tablet by mouth at bedtime.        Marland Kitchen omeprazole (PRILOSEC) 20 MG capsule Take 20 mg by mouth daily.        Marland Kitchen oxybutynin (DITROPAN-XL) 10 MG 24 hr tablet Take 10 mg by mouth daily.          BP 150/90  Pulse 78  Temp(Src) 97.8 F (36.6 C) (Oral)  Resp 16  Wt 194 lb 0.6 oz (88.016 kg)       Objective:   Physical Exam  Constitutional: She appears well-developed and well-nourished.  HENT:  Right Ear: Tympanic membrane and ear canal normal.  Left Ear: Tympanic membrane and ear canal normal.  Nose: Nose normal.  Mouth/Throat: Uvula is midline, oropharynx is clear and moist and mucous membranes are normal.  Eyes:         Hordeolum left upper eyelid with some associated surrounding edema or upper eye lid. No significant erythema. PERRLA, sclera are clear.           Assessment & Plan:

## 2010-12-15 NOTE — Patient Instructions (Addendum)
Please apply warm compresses to the left eye for 15 minutes, 4 times daily until healed. Call if you develop increased swelling, redness, pain of eye lid or surrounding area, if you develop visual problems or if your symptoms are not improved in 48 to 72 hours.

## 2010-12-15 NOTE — Assessment & Plan Note (Signed)
Recommended that she apply warm compresses QID, start keflex if symptoms worsen or fail to improve.

## 2010-12-16 ENCOUNTER — Ambulatory Visit (INDEPENDENT_AMBULATORY_CARE_PROVIDER_SITE_OTHER): Payer: BC Managed Care – PPO | Admitting: Cardiovascular Disease

## 2010-12-16 DIAGNOSIS — R079 Chest pain, unspecified: Secondary | ICD-10-CM

## 2010-12-16 NOTE — Progress Notes (Signed)
Exercise Treadmill Test  Pre-Exercise Testing Evaluation Rhythm: sinus bradycardia  Rate: 52   PR:  .11 QRS:  .09  QT:  .46 QTc: .42           Test  Exercise Tolerance Test Ordering MD: Melene Muller, MD  Interpreting MD:  Melene Muller, MD  Unique Test No:  1 Treadmill:  1  Indication for ETT: chest pain - rule out ischemia  Contraindication to ETT: No   Stress Modality: exercise - treadmill  Cardiac Imaging Performed: non   Protocol: standard Bruce - maximal  Max BP:  149/88  Max MPHR (bpm):  153 85% MPR (bpm):  150  MPHR obtained (bpm):  153 % MPHR obtained:  87  Reached 85% MPHR (min:sec):9:23 Total Exercise Time (min-sec): 9:49  Workload in METS:  11.4 Borg Scale: 19  Reason ETT Terminated:  dyspnea    ST Segment Analysis At Rest: normal ST segments - no evidence of significant ST depression With Exercise: no evidence of significant ST depression  Other Information Arrhythmia:  Yes Angina during ETT:  absent (0) Quality of ETT:  non-diagnostic  ETT Interpretation:  normal - no evidence of ischemia by ST analysis  Comments: Good exercise tolerance. No EKG changes suggestive of ischemia. Pt reached target heart rate. No chest pain with exercise.   Recommendations:  No further ischemic workup.

## 2011-03-06 ENCOUNTER — Other Ambulatory Visit: Payer: Self-pay | Admitting: Family Medicine

## 2011-03-12 ENCOUNTER — Other Ambulatory Visit (HOSPITAL_COMMUNITY)
Admission: RE | Admit: 2011-03-12 | Discharge: 2011-03-12 | Disposition: A | Discharge status: Home / Self Care | Payer: BC Managed Care – PPO | Source: Ambulatory Visit | Attending: Family Medicine | Admitting: Family Medicine

## 2011-03-12 ENCOUNTER — Ambulatory Visit (INDEPENDENT_AMBULATORY_CARE_PROVIDER_SITE_OTHER): Payer: BC Managed Care – PPO | Admitting: Family Medicine

## 2011-03-12 ENCOUNTER — Encounter: Payer: Self-pay | Admitting: Family Medicine

## 2011-03-12 VITALS — BP 122/86 | HR 56 | Temp 98.7°F | Ht 65.5 in | Wt 182.4 lb

## 2011-03-12 DIAGNOSIS — Z79899 Other long term (current) drug therapy: Secondary | ICD-10-CM

## 2011-03-12 DIAGNOSIS — IMO0001 Reserved for inherently not codable concepts without codable children: Secondary | ICD-10-CM

## 2011-03-12 DIAGNOSIS — F419 Anxiety disorder, unspecified: Secondary | ICD-10-CM

## 2011-03-12 DIAGNOSIS — Z309 Encounter for contraceptive management, unspecified: Secondary | ICD-10-CM

## 2011-03-12 DIAGNOSIS — K219 Gastro-esophageal reflux disease without esophagitis: Secondary | ICD-10-CM

## 2011-03-12 DIAGNOSIS — F411 Generalized anxiety disorder: Secondary | ICD-10-CM

## 2011-03-12 DIAGNOSIS — I10 Essential (primary) hypertension: Secondary | ICD-10-CM

## 2011-03-12 DIAGNOSIS — Z01419 Encounter for gynecological examination (general) (routine) without abnormal findings: Secondary | ICD-10-CM | POA: Insufficient documentation

## 2011-03-12 DIAGNOSIS — Z793 Long term (current) use of hormonal contraceptives: Secondary | ICD-10-CM

## 2011-03-12 DIAGNOSIS — Z0000 Encounter for general adult medical examination without abnormal findings: Secondary | ICD-10-CM

## 2011-03-12 DIAGNOSIS — R32 Unspecified urinary incontinence: Secondary | ICD-10-CM

## 2011-03-12 LAB — POCT URINALYSIS DIPSTICK
Glucose, UA: NEGATIVE
Nitrite, UA: NEGATIVE
Urobilinogen, UA: 0.2

## 2011-03-12 MED ORDER — ATENOLOL 100 MG PO TABS: 100.0000 mg | ORAL_TABLET | Freq: Every day | ORAL | Status: DC

## 2011-03-12 MED ORDER — MEDROXYPROGESTERONE ACETATE 150 MG/ML IM SUSP: INTRAMUSCULAR | Status: DC

## 2011-03-12 MED ORDER — ALPRAZOLAM 0.25 MG PO TABS: 0.2500 mg | ORAL_TABLET | Freq: Three times a day (TID) | ORAL | Status: DC | PRN

## 2011-03-12 MED ORDER — OXYBUTYNIN CHLORIDE ER 10 MG PO TB24: 10.0000 mg | ORAL_TABLET | Freq: Every day | ORAL | Status: DC

## 2011-03-12 MED ORDER — OMEPRAZOLE 20 MG PO CPDR: 20.0000 mg | DELAYED_RELEASE_CAPSULE | Freq: Every day | ORAL | Status: DC

## 2011-03-12 NOTE — Progress Notes (Signed)
Subjective:     [name] Ramos is a 45 y.o. female and is here for a comprehensive physical exam. The patient reports no problems.  History   Social History  . Marital Status: Single    Spouse Name: N/A    Number of Children: N/A  . Years of Education: N/A   Occupational History  . [employer]    Social History Main Topics  . Smoking status: Current Everyday Smoker -- 1.0 packs/day for 24 years    Types: Cigarettes    Last Attempt to Quit: 07/02/2008  . Smokeless tobacco: Never Used  . Alcohol Use: No  . Drug Use: No  . Sexually Active: Not Currently -- Female partner(s)   Other Topics Concern  . Not on file   Social History Narrative   Exercising---walking dog daily   Health Maintenance  Topic Date Due  . Pap Smear  10/15/1985  . Influenza Vaccine  03/01/2012  . Tetanus/tdap  01/20/2017    The following portions of the patient's history were reviewed and updated as appropriate: allergies, current medications, past family history, past medical history, past social history, past surgical history and problem list.  Review of Systems Review of Systems  Constitutional: Negative for activity change, appetite change and fatigue.  HENT: Negative for hearing loss, congestion, tinnitus and ear discharge.  dentist q77m Eyes: Negative for visual disturbance (see optho q1y -- vision corrected to 20/20 with glasses).  Respiratory: Negative for cough, chest tightness and shortness of breath.   Cardiovascular: Negative for chest pain, palpitations and leg swelling.  Gastrointestinal: Negative for abdominal pain, diarrhea, constipation and abdominal distention.  Genitourinary: Negative for urgency, frequency, decreased urine volume and difficulty urinating.  Musculoskeletal: Negative for back pain, arthralgias and gait problem.  Skin: Negative for color change, pallor and rash.  Neurological: Negative for dizziness, light-headedness, numbness and headaches.  Hematological: Negative  for adenopathy. Does not bruise/bleed easily.  Psychiatric/Behavioral: Negative for suicidal ideas, confusion, sleep disturbance, self-injury, dysphoric mood, decreased concentration and agitation.       Objective:    BP 122/86  Pulse 56  Temp(Src) 98.7 F (37.1 C) (Oral)  Ht 5' 5.5" (1.664 m)  Wt 182 lb 6.4 oz (82.736 kg)  BMI 29.89 kg/m2  SpO2 99% General appearance: alert, cooperative, appears stated age and no distress Head: Normocephalic, without obvious abnormality, atraumatic Eyes: conjunctivae/corneas clear. PERRL, EOM's intact. Fundi benign. Ears: normal TM&#39;s and external ear canals both ears Nose: Nares normal. Septum midline. Mucosa normal. No drainage or sinus tenderness. Throat: lips, mucosa, and tongue normal; teeth and gums normal Neck: no adenopathy, no carotid bruit, no JVD, supple, symmetrical, trachea midline and thyroid not enlarged, symmetric, no tenderness/mass/nodules Back: symmetric, no curvature. ROM normal. No CVA tenderness. Lungs: clear to auscultation bilaterally Breasts: normal appearance, no masses or tenderness Heart: regular rate and rhythm, S1, S2 normal, no murmur, click, rub or gallop Abdomen: soft, non-tender; bowel sounds normal; no masses,  no organomegaly Pelvic: cervix normal in appearance, external genitalia normal, no adnexal masses or tenderness, no cervical motion tenderness, rectovaginal septum normal, uterus normal size, shape, and consistency and vagina normal without discharge= pap done Extremities: extremities normal, atraumatic, no cyanosis or edema Pulses: 2+ and symmetric Skin: Skin color, texture, turgor normal. No rashes or lesions Lymph nodes: Cervical, supraclavicular, and axillary nodes normal. Neurologic: Alert and oriented X 3, normal strength and tone. Normal symmetric reflexes. Normal coordination and gait psych-- no depression or anxiety    Assessment:  Healthy female exam.    htn--stable Contraception with  dep--- check bmd, take calcium daily anxiety Plan:    ghm utd mammo and bmd to be done See After Visit Summary for Counseling Recommendations

## 2011-03-12 NOTE — Progress Notes (Signed)
Addended by: Legrand Como on: 03/12/2011 11:16 AM   Modules accepted: Orders

## 2011-03-12 NOTE — Patient Instructions (Signed)

## 2011-03-12 NOTE — Progress Notes (Signed)
Addended by: Lelon Perla on: 03/12/2011 09:28 AM   Modules accepted: Orders

## 2011-03-17 ENCOUNTER — Encounter: Payer: Self-pay | Admitting: *Deleted

## 2011-03-17 ENCOUNTER — Other Ambulatory Visit: Payer: Self-pay | Admitting: Family Medicine

## 2011-03-20 ENCOUNTER — Other Ambulatory Visit: Payer: Self-pay | Admitting: Family Medicine

## 2011-03-20 DIAGNOSIS — Z1231 Encounter for screening mammogram for malignant neoplasm of breast: Secondary | ICD-10-CM

## 2011-03-25 ENCOUNTER — Encounter: Payer: Self-pay | Admitting: Family Medicine

## 2011-03-27 ENCOUNTER — Other Ambulatory Visit: Payer: Self-pay | Admitting: Endocrinology

## 2011-03-27 DIAGNOSIS — E049 Nontoxic goiter, unspecified: Secondary | ICD-10-CM

## 2011-03-31 ENCOUNTER — Ambulatory Visit
Admission: RE | Admit: 2011-03-31 | Discharge: 2011-03-31 | Disposition: A | Discharge status: Home / Self Care | Payer: BC Managed Care – PPO | Source: Ambulatory Visit | Attending: Endocrinology | Admitting: Endocrinology

## 2011-03-31 ENCOUNTER — Ambulatory Visit
Admission: RE | Admit: 2011-03-31 | Discharge: 2011-03-31 | Disposition: A | Discharge status: Home / Self Care | Payer: BC Managed Care – PPO | Source: Ambulatory Visit | Attending: Family Medicine | Admitting: Family Medicine

## 2011-03-31 ENCOUNTER — Other Ambulatory Visit: Payer: BC Managed Care – PPO

## 2011-03-31 DIAGNOSIS — Z1231 Encounter for screening mammogram for malignant neoplasm of breast: Secondary | ICD-10-CM

## 2011-03-31 DIAGNOSIS — Z793 Long term (current) use of hormonal contraceptives: Secondary | ICD-10-CM

## 2011-03-31 DIAGNOSIS — E049 Nontoxic goiter, unspecified: Secondary | ICD-10-CM

## 2011-04-10 ENCOUNTER — Other Ambulatory Visit: Payer: BC Managed Care – PPO

## 2011-04-10 DIAGNOSIS — Z1211 Encounter for screening for malignant neoplasm of colon: Secondary | ICD-10-CM

## 2011-04-15 ENCOUNTER — Other Ambulatory Visit: Payer: Self-pay | Admitting: Family Medicine

## 2011-04-15 DIAGNOSIS — Z1211 Encounter for screening for malignant neoplasm of colon: Secondary | ICD-10-CM

## 2011-04-15 LAB — FECAL OCCULT BLOOD, IMMUNOCHEMICAL: Fecal Occult Bld: NEGATIVE

## 2011-05-13 ENCOUNTER — Other Ambulatory Visit: Payer: Self-pay | Admitting: Family Medicine

## 2011-05-13 DIAGNOSIS — K219 Gastro-esophageal reflux disease without esophagitis: Secondary | ICD-10-CM

## 2011-05-13 MED ORDER — OMEPRAZOLE 20 MG PO CPDR: 20.0000 mg | DELAYED_RELEASE_CAPSULE | Freq: Every day | ORAL | Status: DC

## 2011-05-13 NOTE — Telephone Encounter (Signed)
Faxed.   KP 

## 2011-06-15 ENCOUNTER — Emergency Department
Admission: EM | Admit: 2011-06-15 | Discharge: 2011-06-15 | Disposition: A | Discharge status: Home / Self Care | Payer: BC Managed Care – PPO | Source: Home / Self Care | Attending: Family Medicine | Admitting: Family Medicine

## 2011-06-15 DIAGNOSIS — J069 Acute upper respiratory infection, unspecified: Secondary | ICD-10-CM

## 2011-06-15 MED ORDER — BENZONATATE 200 MG PO CAPS: 200.0000 mg | ORAL_CAPSULE | Freq: Every day | ORAL | Status: AC

## 2011-06-15 MED ORDER — AZITHROMYCIN 250 MG PO TABS: ORAL_TABLET | ORAL | Status: AC

## 2011-06-15 NOTE — ED Provider Notes (Addendum)
History     CSN: [id number]  Arrival date & time 06/15/11  0816   None     Chief Complaint  Patient presents with  . URI      HPI Comments: Patient complains of approximately 3 day history of gradually progressive URI symptoms beginning with nasal congestion but no sore throat.  A cough started next. Complains of fatigue but no myalgias.  Cough is now worse at night and generally non-productive during the day.  There has been no pleuritic pain, shortness of breath, or wheezes.   Patient is a 46 y.o. female presenting with URI. The history is provided by the patient.  URI The primary symptoms include fatigue, headaches, ear pain and cough. Primary symptoms do not include fever, sore throat, swollen glands, wheezing, abdominal pain, nausea, vomiting, myalgias, arthralgias or rash. The current episode started 3 to 5 days ago. This is a new problem.  Symptoms associated with the illness include plugged ear sensation, congestion and rhinorrhea. The illness is not associated with chills, facial pain or sinus pressure.    Past Medical History  Diagnosis Date  . Alcohol abuse     recovering alcoholic  . Depression   . Ex-smoker   . Anxiety   . Dyslipidemia     Followed by PCP  . PVC's (premature ventricular contractions)     Controlled on beta blocker therapy;  Echo 9/10: EF 55-60%, grade 1 diastolic dysfunction, mild MR, mild LAE  . Hiatal hernia     with reflux  . Cholecystitis, acute   . Obesity   . Urinary tract infection   . Tinea corporis   . Preventative health care   . GERD (gastroesophageal reflux disease)   . Thyroid nodule     hx  . Hematuria   . Congenital anomaly of neck   . Unspecified contraceptive management   . Allergic urticaria   . Kidney stone   . Hypertension     stress test 11/2010 normal    Past Surgical History  Procedure Date  . Tonsillectomy   . Leep surgery     mid 20's    Family History  Problem Relation Age of Onset  . Diabetes Mother     . Liver disease Mother   . Cirrhosis Mother   . Irritable bowel syndrome Mother   . Colon polyps Mother   . Breast cancer Mother   . Thyroid disease Mother   . Lung cancer Maternal Uncle   . Lung cancer Maternal Grandmother   . Heart attack Maternal Grandmother 48  . Diabetes Maternal Grandmother   . Lung cancer Paternal Grandmother   . Breast cancer Other     1st degree relative--44 yo  . Coronary artery disease Other     female 1st degree relative 44 yo  . Hypertension Other     History  Substance Use Topics  . Smoking status: Current Everyday Smoker -- 1.0 packs/day for 24 years    Types: Cigarettes    Last Attempt to Quit: 07/02/2008  . Smokeless tobacco: Never Used  . Alcohol Use: No    OB History    Grav Para Term Preterm Abortions TAB SAB Ect Mult Living                  Review of Systems  Constitutional: Positive for fatigue. Negative for fever and chills.  HENT: Positive for ear pain, congestion and rhinorrhea. Negative for sore throat and sinus pressure.   Respiratory: Positive for  cough. Negative for wheezing.   Gastrointestinal: Negative for nausea, vomiting and abdominal pain.  Musculoskeletal: Negative for myalgias and arthralgias.  Skin: Negative for rash.  Neurological: Positive for headaches.   No sore throat + cough No pleuritic pain No wheezing + nasal congestion + post-nasal drainage No sinus pain/pressure No itchy/red eyes ? earache No hemoptysis No SOB No fever/chills No nausea No vomiting No abdominal pain No diarrhea No urinary symptoms No skin rashes + mild fatigue No myalgias + headache   Allergies  Piroxicam  Home Medications   Current Outpatient Rx  Name Route Sig Dispense Refill  . ALPRAZOLAM 0.25 MG PO TABS Oral Take 1 tablet (0.25 mg total) by mouth 3 (three) times daily as needed for anxiety. 30 tablet 2  . ASPIRIN 81 MG PO TABS Oral Take 81 mg by mouth daily.      . ATENOLOL 100 MG PO TABS Oral Take 1 tablet  (100 mg total) by mouth daily. 90 tablet 3  . AZITHROMYCIN 250 MG PO TABS  Take 2 tabs today; then begin one tab once daily for 4 more days (Rx void after 06/25/11) 6 each 0  . BENZONATATE 200 MG PO CAPS Oral Take 1 capsule (200 mg total) by mouth at bedtime. Take as needed for cough 12 capsule 0  . MEDROXYPROGESTERONE ACETATE 150 MG/ML IM SUSP  As directed q72months 1 mL 3  . NIACIN-SIMVASTATIN ER 1000-40 MG PO TB24 Oral Take 1 tablet by mouth at bedtime.      . OMEPRAZOLE 20 MG PO CPDR Oral Take 1 capsule (20 mg total) by mouth daily. 90 capsule 3  . OXYBUTYNIN CHLORIDE ER 10 MG PO TB24 Oral Take 1 tablet (10 mg total) by mouth daily. 90 tablet 3    BP 158/85  Pulse 65  Temp(Src) 98.6 F (37 C) (Oral)  Resp 16  Ht 5' 4.5" (1.638 m)  Wt 178 lb (80.74 kg)  BMI 30.08 kg/m2  SpO2 100%  Physical Exam Nursing notes and Vital Signs reviewed. Appearance:  Patient appears healthy, stated age, and in no acute distress Eyes:  Pupils are equal, round, and reactive to light and accomodation.  Extraocular movement is intact.  Conjunctivae are not inflamed  Ears:  Canals normal.  Tympanic membranes normal.  Nose:  Mildly congested turbinates.  No sinus tenderness.   Pharynx:  Normal Neck:  Supple.   No adenopathy Lungs:  Clear to auscultation.  Breath sounds are equal.  Heart:  Regular rate and rhythm without murmurs, rubs, or gallops.  Abdomen:  Nontender without masses or hepatosplenomegaly.  Bowel sounds are present.  No CVA or flank tenderness.  Skin:  No rash present.   ED Course  Procedures  none      1. Acute upper respiratory infections of unspecified site       MDM   There is no evidence of bacterial infection today.   Treat symptomatically for now  Take Mucinex (guaifenesin) twice daily for congestion.  Increase fluid intake, rest. May use Afrin nasal spray (or generic oxymetazoline) twice daily for about 5 days.  Also recommend using saline nasal spray several times daily  and saline nasal irrigation (AYR is a common product) Stop all antihistamines for now, and other non-prescription cough/cold preparations. Begin Azithromycin if not improving about 5 days or if persistent fever develops. Follow-up with family doctor if not improving about 10 days.        Donna Christen, MD 06/15/11 1610  Donna Christen, MD  06/15/11 1046 

## 2011-06-15 NOTE — ED Notes (Signed)
Runny nose, congestion, ears clogged x 3 days

## 2011-09-08 ENCOUNTER — Ambulatory Visit (INDEPENDENT_AMBULATORY_CARE_PROVIDER_SITE_OTHER): Payer: BC Managed Care – PPO | Admitting: Family Medicine

## 2011-09-08 ENCOUNTER — Ambulatory Visit: Payer: BC Managed Care – PPO | Admitting: Cardiology

## 2011-09-08 ENCOUNTER — Encounter: Payer: Self-pay | Admitting: Family Medicine

## 2011-09-08 VITALS — BP 122/78 | HR 66 | Temp 98.4°F | Wt 182.2 lb

## 2011-09-08 DIAGNOSIS — L918 Other hypertrophic disorders of the skin: Secondary | ICD-10-CM

## 2011-09-08 DIAGNOSIS — L909 Atrophic disorder of skin, unspecified: Secondary | ICD-10-CM

## 2011-09-08 DIAGNOSIS — L919 Hypertrophic disorder of the skin, unspecified: Secondary | ICD-10-CM

## 2011-09-08 NOTE — Progress Notes (Signed)
  Skin Tag Removal Procedure Note  Pre-operative Diagnosis: Classic skin tags (acrochordon)  Post-operative Diagnosis: Classic skin tags (acrochordon)  Locations:medial thigh,  R axilla  Indications: irritated skin tags  Anesthesia: xylocaine 1 cc without added sodium bicarbonate  Procedure Details  The risks (including bleeding and infection) and benefits of the procedure and Written informed consent obtained. Using sterile iris scissors, multiple skin tags were snipped off at their bases after cleansing with Betadine.  Bleeding was controlled by pressure and silver nitrate.  Findings: Pathognomonic benign lesions  not sent for pathological exam.  Condition: Stable  Complications: none.  Plan: 1. Instructed to keep the wounds dry and covered for 24-48h and clean thereafter. 2. Warning signs of infection were reviewed.   3. Recommended that the patient use tylenol as needed for pain.  4. Return as needed.

## 2011-09-09 ENCOUNTER — Ambulatory Visit (INDEPENDENT_AMBULATORY_CARE_PROVIDER_SITE_OTHER): Payer: BC Managed Care – PPO | Admitting: Cardiovascular Disease

## 2011-09-09 ENCOUNTER — Encounter: Payer: Self-pay | Admitting: Cardiovascular Disease

## 2011-09-09 VITALS — BP 140/80 | HR 50 | Ht 64.0 in | Wt 179.0 lb

## 2011-09-09 DIAGNOSIS — I4949 Other premature depolarization: Secondary | ICD-10-CM

## 2011-09-09 DIAGNOSIS — I493 Ventricular premature depolarization: Secondary | ICD-10-CM

## 2011-09-09 DIAGNOSIS — I739 Peripheral vascular disease, unspecified: Secondary | ICD-10-CM

## 2011-09-09 DIAGNOSIS — I779 Disorder of arteries and arterioles, unspecified: Secondary | ICD-10-CM

## 2011-09-09 NOTE — Progress Notes (Signed)
History of Present Illness: [name] with history of PVCs, HTN,  tobacco abuse and former alcohol abuse seen initially in September 2010 for  dizziness and palpitations. She was found to have normal LV systolic function with mild diastolic dysfunction and mild MR. She was evaluated for chest pain in July 2012 with a normal exercise stress test.   She is here today for follow up and report no chest pain,  SOB or lower extremity edema. She is tolerating her beta blocker without any dizziness or near syncope. She is working full time and enjoying life. She is not exercising. She has started back smoking. BP has been good at home.   Primary Care Physician: [name] Ramos   Past Medical History  Diagnosis Date  . Alcohol abuse     recovering alcoholic  . Depression   . Ex-smoker   . Anxiety   . Dyslipidemia     Followed by PCP  . PVC's (premature ventricular contractions)     Controlled on beta blocker therapy;  Echo 9/10: EF 55-60%, grade 1 diastolic dysfunction, mild MR, mild LAE  . Hiatal hernia     with reflux  . Cholecystitis, acute   . Obesity   . Urinary tract infection   . Tinea corporis   . Preventative health care   . GERD (gastroesophageal reflux disease)   . Thyroid nodule     hx  . Hematuria   . Congenital anomaly of neck   . Unspecified contraceptive management   . Allergic urticaria   . Kidney stone   . Hypertension     stress test 11/2010 normal    Past Surgical History  Procedure Date  . Tonsillectomy   . Leep surgery     mid 20's    Current Outpatient Prescriptions  Medication Sig Dispense Refill  . ALPRAZolam (XANAX) 0.25 MG tablet Take 1 tablet (0.25 mg total) by mouth 3 (three) times daily as needed for anxiety.  30 tablet  2  . atenolol (TENORMIN) 100 MG tablet Take 1 tablet (100 mg total) by mouth daily.  90 tablet  3  . medroxyPROGESTERone (DEPO-PROVERA) 150 MG/ML injection As directed q70months  1 mL  3  . omeprazole (PRILOSEC) 20 MG capsule Take 1  capsule (20 mg total) by mouth daily.  90 capsule  3  . oxybutynin (DITROPAN-XL) 10 MG 24 hr tablet Take 1 tablet (10 mg total) by mouth daily.  90 tablet  3    Allergies  Allergen Reactions  . Piroxicam     REACTION: hives    History   Social History  . Marital Status: Single    Spouse Name: N/A    Number of Children: N/A  . Years of Education: N/A   Occupational History  . [employer]    Social History Main Topics  . Smoking status: Current Everyday Smoker -- 1.0 packs/day for 24 years    Types: Cigarettes    Last Attempt to Quit: 07/02/2008  . Smokeless tobacco: Never Used  . Alcohol Use: No  . Drug Use: No  . Sexually Active: Not Currently -- Female partner(s)   Other Topics Concern  . Not on file   Social History Narrative   Exercising---walking dog daily    Family History  Problem Relation Age of Onset  . Diabetes Mother   . Liver disease Mother   . Cirrhosis Mother   . Irritable bowel syndrome Mother   . Colon polyps Mother   .  Breast cancer Mother   . Thyroid disease Mother   . Lung cancer Maternal Uncle   . Lung cancer Maternal Grandmother   . Heart attack Maternal Grandmother 48  . Diabetes Maternal Grandmother   . Lung cancer Paternal Grandmother   . Breast cancer Other     1st degree relative--44 yo  . Coronary artery disease Other     female 1st degree relative 44 yo  . Hypertension Other     Review of Systems:  As stated in the HPI and otherwise negative.   BP 140/80  Pulse 50  Ht 5\' 4"  (1.626 m)  Wt 179 lb (81.194 kg)  BMI 30.73 kg/m2  Physical Examination: General: Well developed, well nourished, NAD HEENT: OP clear, mucus membranes moist SKIN: warm, dry. No rashes. Neuro: No focal deficits Musculoskeletal: Muscle strength 5/5 all ext Psychiatric: Mood and affect normal Neck: No JVD, no carotid bruits, no thyromegaly, no lymphadenopathy. Lungs:Clear bilaterally, no wheezes, rhonci, crackles Cardiovascular: Regular rate and  rhythm. No murmurs, gallops or rubs. Abdomen:Soft. Bowel sounds present. Non-tender.  Extremities: No lower extremity edema. Pulses are 2 + in the bilateral DP/PT.

## 2011-09-09 NOTE — Assessment & Plan Note (Signed)
Her palpitations are rare on atenolol. Will not make changes today.

## 2011-09-09 NOTE — Patient Instructions (Signed)
Your physician wants you to follow-up in:  12 months.  You will receive a reminder letter in the mail two months in advance. If you don't receive a letter, please call our office to schedule the follow-up appointment.   

## 2011-11-03 ENCOUNTER — Telehealth: Payer: Self-pay | Admitting: Internal Medicine

## 2011-11-03 NOTE — Telephone Encounter (Signed)
Spoke with patient and she states she started having diarrhea 1 week ago. Over the weekend, she started having vomiting and stomach pain at her belly button. Last night, she had terrible heartburn. Denies any recent travel out of the country, use of antibiotics or bleeding. She has had diarrhea x 3 today. She did take Pepto bismol today. She takes Prilosec 20 mg daily and will increase this to BID. She also states she has started smoking again. Scheduled patient with Mike Gip, PA on 11/04/11 at 3:30 PM. Hx GERD, IBS.

## 2011-11-03 NOTE — Telephone Encounter (Signed)
Reviewed and agree. You could have just send her Phenergan 25 mg, and order Labs. But it is OK for her to see Amy.

## 2011-11-04 ENCOUNTER — Encounter: Payer: Self-pay | Admitting: Physician Assistant

## 2011-11-04 ENCOUNTER — Ambulatory Visit: Payer: BC Managed Care – PPO | Admitting: Family Medicine

## 2011-11-04 ENCOUNTER — Other Ambulatory Visit: Payer: BC Managed Care – PPO

## 2011-11-04 ENCOUNTER — Ambulatory Visit (INDEPENDENT_AMBULATORY_CARE_PROVIDER_SITE_OTHER): Payer: BC Managed Care – PPO | Admitting: Physician Assistant

## 2011-11-04 VITALS — BP 124/78 | HR 60 | Ht 64.0 in | Wt 173.0 lb

## 2011-11-04 DIAGNOSIS — K219 Gastro-esophageal reflux disease without esophagitis: Secondary | ICD-10-CM

## 2011-11-04 DIAGNOSIS — R197 Diarrhea, unspecified: Secondary | ICD-10-CM

## 2011-11-04 DIAGNOSIS — R112 Nausea with vomiting, unspecified: Secondary | ICD-10-CM

## 2011-11-04 MED ORDER — DICYCLOMINE HCL 10 MG PO CAPS: ORAL_CAPSULE | ORAL | Status: DC

## 2011-11-04 MED ORDER — ONDANSETRON 4 MG PO TBDP: ORAL_TABLET | ORAL | Status: DC

## 2011-11-04 NOTE — Patient Instructions (Signed)
Please go to the basement level to have your labs drawn.  Eat a bland diet for now. We have given you samples of Prilosec OTC, take 1 capsule twice daily for 2 weeks then decrease to once daily. We sent prescriptions to [facility], [address]. 1. Zofran for nausea. 2. Bentyl for cramping and spasms, abdominal pain.  Call us back if symptoms persist.

## 2011-11-04 NOTE — Progress Notes (Signed)
Subjective:    Patient ID: [name] Ramos, female    DOB: 17-May-1966, 46 y.o.   MRN: [id number]  HPI [name] Ramos is a 46 year old white female known to Dr. Lina Sar from prior endoscopy. She has history of GERD and had EGD in October of 2009 which showed a 3 cm hiatal hernia. She comes in today with complaints of nausea vomiting and diarrhea. She says she has been sick over the past 6 days with diarrhea up to 4-5 times per day and an upset stomach. On Monday 6/3 she had an episode of large volume of emesis early in the morning before she had eaten anything and is concerned why she would have thrown up so much when she hadn't eaten for any hours. Later that evening she had a "heartburn attack" and finally forced herself to vomit and felt better. Yesterday she did not have any further nausea vomiting and today she has also done well so far and is able to eat of a small amount. She is continued to have some heartburn and increased belching. He has had one diarrheal stool this morning nonbloody. Not had any associated fever or chills. She has been under an increased amount of stress recently no known sick contacts no recent antibiotics etc. She says she has lost about 10 pounds recently but this was intentionally.  She is  not taking any regular NSAIDs, she does drink some alcohol. She has been on a Omeprazole once daily chronically    Review of Systems  Constitutional: Positive for appetite change.  HENT: Negative.   Eyes: Negative.   Respiratory: Negative.   Cardiovascular: Negative.   Gastrointestinal: Positive for vomiting, abdominal pain and diarrhea.  Musculoskeletal: Negative.   Neurological: Negative.   Hematological: Negative.   Psychiatric/Behavioral: Negative.    Outpatient Prescriptions Prior to Visit  Medication Sig Dispense Refill  . ALPRAZolam (XANAX) 0.25 MG tablet Take 1 tablet (0.25 mg total) by mouth 3 (three) times daily as needed for anxiety.  30 tablet  2  . atenolol  (TENORMIN) 100 MG tablet Take 1 tablet (100 mg total) by mouth daily.  90 tablet  3  . medroxyPROGESTERone (DEPO-PROVERA) 150 MG/ML injection As directed q74months  1 mL  3  . omeprazole (PRILOSEC) 20 MG capsule Take 1 capsule (20 mg total) by mouth daily.  90 capsule  3  . oxybutynin (DITROPAN-XL) 10 MG 24 hr tablet Take 1 tablet (10 mg total) by mouth daily.  90 tablet  3   Allergies  Allergen Reactions  . Piroxicam     REACTION: hives       History   Social History  . Marital Status: Single    Spouse Name: N/A    Number of Children: N/A  . Years of Education: N/A   Occupational History  . [employer]    Social History Main Topics  . Smoking status: Current Everyday Smoker -- 1.0 packs/day for 24 years    Types: Cigarettes    Last Attempt to Quit: 07/02/2008  . Smokeless tobacco: Never Used  . Alcohol Use: No  . Drug Use: No  . Sexually Active: Not Currently -- Female partner(s)   Other Topics Concern  . Not on file   Social History Narrative   Exercising---walking dog daily     Objective:   Physical Exam well-developed white female in no acute distress, pleasant blood pressure 124/78 pulse 60 height 5 foot 4 weight 173. HEENT; nontraumatic normocephalic EOMI PERRLA sclera anicteric,Neck; Supple no JVD,  Cardiovascular; regular rate and rhythm with S1-S2 no murmur gallop, Pulmonary; clear bilaterally, Abdomen; soft no focal tenderness no guarding no rebound no palpable mass or hepatosplenomegaly bowel sounds are active, Rectal; exam not done, Extremities; no clubbing cyanosis or edema and warm and dry, Psych; mood and affect normal and appropriate        Assessment & Plan:  #23 46 year old female with chronic GERD presenting with 6 day history of diarrhea intermittent nausea and vomiting all consistent with an acute gastroenteritis. She does not have any worrisome signs or symptoms. I suspect she has a common of gastroparesis related to her acute illness.  Plan; bland  soft diet and advance gradually as tolerated Check CBC ,CMET today Increase omeprazole to 20 mg by mouth twice daily x2 weeks  Zofran 4 mg every 6 hours when necessary for nausea Bentyl 10 mg by mouth twice daily as needed for cramping and diarrhea. She does not feel that she needs this at present, but will have it on hand. Patient advised that if her symptoms are proved persisting beyond the next week to call back and we will reassess.

## 2011-11-05 ENCOUNTER — Telehealth: Payer: Self-pay | Admitting: Physician Assistant

## 2011-11-05 NOTE — Progress Notes (Signed)
Agree with initial assessment and plans 

## 2011-11-05 NOTE — Telephone Encounter (Signed)
Discussed with patient the diagnosis [name] Gip, PA and reviewed her suggestions. Patient aware she is to call back next week if symptoms are not better.

## 2011-12-08 ENCOUNTER — Ambulatory Visit: Payer: BC Managed Care – PPO | Admitting: Internal Medicine

## 2012-01-22 ENCOUNTER — Emergency Department (HOSPITAL_COMMUNITY)
Admission: EM | Admit: 2012-01-22 | Discharge: 2012-01-22 | Disposition: A | Discharge status: Home / Self Care | Payer: BC Managed Care – PPO | Attending: Emergency Medicine | Admitting: Emergency Medicine

## 2012-01-22 ENCOUNTER — Encounter: Payer: Self-pay | Admitting: Family Medicine

## 2012-01-22 ENCOUNTER — Telehealth: Payer: Self-pay

## 2012-01-22 ENCOUNTER — Ambulatory Visit (INDEPENDENT_AMBULATORY_CARE_PROVIDER_SITE_OTHER): Payer: BC Managed Care – PPO | Admitting: Family Medicine

## 2012-01-22 ENCOUNTER — Encounter (HOSPITAL_COMMUNITY): Payer: Self-pay

## 2012-01-22 VITALS — BP 148/92 | HR 75 | Temp 98.6°F | Wt 172.0 lb

## 2012-01-22 DIAGNOSIS — F10939 Alcohol use, unspecified with withdrawal, unspecified: Secondary | ICD-10-CM | POA: Insufficient documentation

## 2012-01-22 DIAGNOSIS — F17200 Nicotine dependence, unspecified, uncomplicated: Secondary | ICD-10-CM | POA: Insufficient documentation

## 2012-01-22 DIAGNOSIS — F341 Dysthymic disorder: Secondary | ICD-10-CM | POA: Insufficient documentation

## 2012-01-22 DIAGNOSIS — F1010 Alcohol abuse, uncomplicated: Secondary | ICD-10-CM | POA: Insufficient documentation

## 2012-01-22 DIAGNOSIS — E669 Obesity, unspecified: Secondary | ICD-10-CM | POA: Insufficient documentation

## 2012-01-22 DIAGNOSIS — F10239 Alcohol dependence with withdrawal, unspecified: Secondary | ICD-10-CM

## 2012-01-22 DIAGNOSIS — K219 Gastro-esophageal reflux disease without esophagitis: Secondary | ICD-10-CM | POA: Insufficient documentation

## 2012-01-22 DIAGNOSIS — I10 Essential (primary) hypertension: Secondary | ICD-10-CM | POA: Insufficient documentation

## 2012-01-22 DIAGNOSIS — F1020 Alcohol dependence, uncomplicated: Secondary | ICD-10-CM

## 2012-01-22 LAB — CBC
Hemoglobin: 15.6 g/dL — ABNORMAL HIGH (ref 12.0–15.0)
MCH: 34.1 pg — ABNORMAL HIGH (ref 26.0–34.0)
MCHC: 35.5 g/dL (ref 30.0–36.0)
MCV: 95.9 fL (ref 78.0–100.0)
RBC: 4.58 MIL/uL (ref 3.87–5.11)

## 2012-01-22 LAB — COMPREHENSIVE METABOLIC PANEL
ALT: 76 U/L — ABNORMAL HIGH (ref 0–35)
Alkaline Phosphatase: 58 U/L (ref 39–117)
BUN: 12 mg/dL (ref 6–23)
CO2: 23 mEq/L (ref 19–32)
GFR calc Af Amer: >90 mL/min (ref >90)
GFR calc non Af Amer: >90 mL/min (ref >90)
Glucose, Bld: 112 mg/dL — ABNORMAL HIGH (ref 70–99)
Potassium: 3.8 mEq/L (ref 3.5–5.1)
Total Bilirubin: 1 mg/dL (ref 0.3–1.2)
Total Protein: 7.6 g/dL (ref 6.0–8.3)

## 2012-01-22 LAB — ETHANOL: Alcohol, Ethyl (B): <11 mg/dL (ref 0–11)

## 2012-01-22 LAB — POCT PREGNANCY, URINE: Preg Test, Ur: NEGATIVE

## 2012-01-22 LAB — RAPID URINE DRUG SCREEN, HOSP PERFORMED
Barbiturates: NOT DETECTED
Benzodiazepines: NOT DETECTED

## 2012-01-22 MED ORDER — LORAZEPAM 1 MG PO TABS
1.0000 mg | ORAL_TABLET | Freq: Once | ORAL | Status: AC
  Administered 2012-01-22: 1 mg via ORAL
  Filled 2012-01-22: qty 1

## 2012-01-22 MED ORDER — LORAZEPAM 1 MG PO TABS: 1.0000 mg | ORAL_TABLET | Freq: Four times a day (QID) | ORAL | Status: AC | PRN

## 2012-01-22 NOTE — Assessment & Plan Note (Signed)
Pt stopped last night She had a 5th over last 2 days D/w [facility]-- they advised that she go to [facility] to be evaluated by [facility]

## 2012-01-22 NOTE — ED Notes (Signed)
Patient reports that she went to her PCP today and requested help with alcohol problem. Patient reports that she has not drank since last night. Patient states she drank a lot of whiskey, but does not know how much.

## 2012-01-22 NOTE — Patient Instructions (Signed)
How Much is Too Much Alcohol?       Drinking too much alcohol can cause injury, accidents, and health problems. These types of problems can include:   Car crashes.   Falls.   Family fighting (domestic violence).   Drowning.   Fights.   Injuries.   Burns.   Damage to certain organs.   Having a baby with birth defects.  ONE DRINK CAN BE TOO MUCH WHEN YOU ARE:   Working.   Pregnant or breastfeeding.   Taking medicines. Ask your doctor.   Driving or planning to drive.  WHAT IS A STANDARD DRINK?   1 regular beer (12 ounces or 360 milliliters).   1 glass of wine (5 ounces or 150 milliliters).   1 shot of liquor (1.5 ounces or 45 milliliters).  BLOOD ALCOHOL LEVELS   .00 A person is sober.   .03 A person has no trouble keeping balance, talking, or seeing right, but a "buzz" may be felt.   .05 A person feels "buzzed" and relaxed.   .08 or .10 A person is drunk. He or she has trouble talking, seeing right, and keeping his or her balance.   .15 A person loses body control and may pass out (blackout).   .20 A person has trouble walking (staggering) and throws up (vomits).   .30 A person will pass out (unconscious).   .40+ A person will be in a coma. Death is possible.  If you or someone you know has a drinking problem, get help from a doctor.   Document Released: 03/14/2009 Document Revised: 05/07/2011 Document Reviewed: 03/14/2009   [employer] Patient Information 2012 [employer]

## 2012-01-22 NOTE — Progress Notes (Signed)
  Subjective:    Patient ID: [name] Ramos, female    DOB: 20-Feb-1966, 46 y.o.   MRN: [id number]  HPI Pt here to discuss her alcohol ---- she went through [facility] detox about 6 years ago and started drinking again last Christmas but started more heavily in the last few months.  Pt has not had a drink since last night--now shaky nauseous and sweating.       Review of Systems As above     Objective:   Physical Exam  Constitutional: She is oriented to person, place, and time. No distress.  Neurological: She is alert and oriented to person, place, and time.  Skin: She is diaphoretic.  Psychiatric:       Pt shaking and sweaty Pt states she is not suicidal           Assessment & Plan:

## 2012-01-22 NOTE — ED Notes (Signed)
Pt presenting to ed with c/o wanting detox pt states she drinks a 1/2 of a fifth of liquor daily pt states she last drink last night unknown amount. Pt states she spoke with her pcp and was told to present to ed for possible withdrawal symptoms. Pt with obvious tremors noted. Pt denies SI/HI at this time. Pt is alert and oriented at this time. Pt states positive nausea no vomiting. Pt with family at bedside

## 2012-01-22 NOTE — Telephone Encounter (Signed)
Spoke with patient and she stated she wanted to discuss stop drinking. The last time she had a drink was yesterday and she wants to stop,  apt scheduled for today at 3 pm.       KP

## 2012-01-22 NOTE — ED Provider Notes (Signed)
History     CSN: [id number]  Arrival date & time 01/22/12  1628   First MD Initiated Contact with Patient 01/22/12 2037      Chief Complaint  Patient presents with  . Medical Clearance  . Alcohol Problem    (Consider location/radiation/quality/duration/timing/severity/associated sxs/prior treatment) HPI Comments: [name] Ramos is a 46 y.o. Female who presents with complaint of an alcohol problem and depression. Pt states she drinks everyday. Problem for several years but getting worse due to recent changes in personal life. Pt states she has been to inpatient tx 6 years ago, which has helped. She most recently has been drinking large amounts of liqueur daily. States knows needs to get her life together, and went to her PCP today. Was sent here because they were not sure how to go about helping her. PT denies SI or HI. Pt's last drink was yesterday. She states she feels shaky, otherwise no complaints.   The history is provided by the patient.    Past Medical History  Diagnosis Date  . Alcohol abuse     recovering alcoholic  . Depression   . Anxiety   . Dyslipidemia     Followed by PCP  . PVC's (premature ventricular contractions)     Controlled on beta blocker therapy;  Echo 9/10: EF 55-60%, grade 1 diastolic dysfunction, mild MR, mild LAE  . Hiatal hernia     with reflux  . Cholecystitis, acute   . Obesity   . Urinary tract infection   . Tinea corporis   . Preventative health care   . GERD (gastroesophageal reflux disease)   . Thyroid nodule     hx  . Hematuria   . Congenital anomaly of neck   . Unspecified contraceptive management   . Allergic urticaria   . Kidney stone   . Hypertension     stress test 11/2010 normal    Past Surgical History  Procedure Date  . Tonsillectomy   . Leep surgery     mid 20's    Family History  Problem Relation Age of Onset  . Diabetes Mother   . Liver disease Mother   . Cirrhosis Mother   . Irritable bowel syndrome Mother     . Colon polyps Mother   . Breast cancer Mother   . Thyroid disease Mother   . Lung cancer Maternal Uncle   . Lung cancer Maternal Grandmother   . Heart attack Maternal Grandmother 48  . Diabetes Maternal Grandmother   . Lung cancer Paternal Grandmother   . Breast cancer Other     1st degree relative--44 yo  . Coronary artery disease Other     female 1st degree relative 44 yo  . Hypertension Other     History  Substance Use Topics  . Smoking status: Current Everyday Smoker -- 1.0 packs/day for 24 years    Types: Cigarettes    Last Attempt to Quit: 07/02/2008  . Smokeless tobacco: Never Used  . Alcohol Use: Yes     drinks daily    OB History    Grav Para Term Preterm Abortions TAB SAB Ect Mult Living                  Review of Systems  Constitutional: Negative for fever and chills.  HENT: Negative for neck pain.   Eyes: Negative for visual disturbance.  Respiratory: Negative.   Cardiovascular: Negative.   Gastrointestinal: Negative.   Genitourinary: Negative for dysuria, flank pain and vaginal discharge.  Musculoskeletal: Negative for myalgias.  Skin: Negative.   Neurological: Positive for tremors. Negative for dizziness, light-headedness and headaches.  Psychiatric/Behavioral:       Depression    Allergies  Piroxicam  Home Medications   Current Outpatient Rx  Name Route Sig Dispense Refill  . ALPRAZOLAM 0.25 MG PO TABS Oral Take 0.25 mg by mouth 3 (three) times daily as needed. anxiety    . ATENOLOL 100 MG PO TABS Oral Take 100 mg by mouth daily.    Marland Kitchen MEDROXYPROGESTERONE ACETATE 150 MG/ML IM SUSP  As directed q19months 1 mL 3  . OMEPRAZOLE 20 MG PO CPDR Oral Take 1 capsule (20 mg total) by mouth daily. 90 capsule 3  . OXYBUTYNIN CHLORIDE ER 10 MG PO TB24 Oral Take 10 mg by mouth daily.      BP 156/76  Pulse 55  Temp 99.5 F (37.5 C) (Oral)  Resp 18  SpO2 100%  Physical Exam  Nursing note and vitals reviewed. Constitutional: She is oriented to  person, place, and time. She appears well-developed and well-nourished. No distress.  HENT:  Head: Normocephalic.  Eyes: Conjunctivae are normal. Pupils are equal, round, and reactive to light.  Neck: Normal range of motion. Neck supple.  Cardiovascular: Normal rate, regular rhythm and normal heart sounds.   Pulmonary/Chest: Effort normal and breath sounds normal. No respiratory distress. She has no wheezes. She has no rales.  Abdominal: Soft. Bowel sounds are normal. She exhibits no distension. There is no tenderness.  Musculoskeletal: She exhibits no edema.  Neurological: She is alert and oriented to person, place, and time.       Mild tremor in upper extremities noted. Normal speech, normal coordination, normal gait.   Skin: Skin is warm and dry.  Psychiatric: She has a normal mood and affect. Her behavior is normal.    ED Course  Procedures (including critical care time)  Results for orders placed during the hospital encounter of 01/22/12  CBC      Component Value Range   WBC 6.9  4.0 - 10.5 K/uL   RBC 4.58  3.87 - 5.11 MIL/uL   Hemoglobin 15.6 (*) 12.0 - 15.0 g/dL   HCT 13.2  44.0 - 10.2 %   MCV 95.9  78.0 - 100.0 fL   MCH 34.1 (*) 26.0 - 34.0 pg   MCHC 35.5  30.0 - 36.0 g/dL   RDW 72.5  36.6 - 44.0 %   Platelets 224  150 - 400 K/uL  COMPREHENSIVE METABOLIC PANEL      Component Value Range   Sodium 134 (*) 135 - 145 mEq/L   Potassium 3.8  3.5 - 5.1 mEq/L   Chloride 98  96 - 112 mEq/L   CO2 23  19 - 32 mEq/L   Glucose, Bld 112 (*) 70 - 99 mg/dL   BUN 12  6 - 23 mg/dL   Creatinine, Ser 3.47  0.50 - 1.10 mg/dL   Calcium 42.5  8.4 - 95.6 mg/dL   Total Protein 7.6  6.0 - 8.3 g/dL   Albumin 4.6  3.5 - 5.2 g/dL   AST 56 (*) 0 - 37 U/L   ALT 76 (*) 0 - 35 U/L   Alkaline Phosphatase 58  39 - 117 U/L   Total Bilirubin 1.0  0.3 - 1.2 mg/dL   GFR calc non Af Amer >90  >90 mL/min   GFR calc Af Amer >90  >90 mL/min  ETHANOL      Component Value  Range   Alcohol, Ethyl (B) <11   0 - 11 mg/dL  URINE RAPID DRUG SCREEN (HOSP PERFORMED)      Component Value Range   Opiates NONE DETECTED  NONE DETECTED   Cocaine NONE DETECTED  NONE DETECTED   Benzodiazepines NONE DETECTED  NONE DETECTED   Amphetamines NONE DETECTED  NONE DETECTED   Tetrahydrocannabinol NONE DETECTED  NONE DETECTED   Barbiturates NONE DETECTED  NONE DETECTED  POCT PREGNANCY, URINE      Component Value Range   Preg Test, Ur NEGATIVE  NEGATIVE   Pt does not want inpatient detox. Denies SI or HI. I spoke with [facility] and they came by and provided pt with outpatient resources. WIll give pt prescription for ativan to help with withdrawal symptoms.     1. Alcohol dependence   2. Alcohol withdrawal       MDM  Pt with alcohol addiction problem. Here for help with withdrawal symptoms and to get resources. She does not want to have inpatient treatment. She has no hx of DTs or seizures. Pt's alcohol today is 0, and last drink was yesterday. She is minimally shaky, but is non toxic. Resources for outpatient treatment provided by [facility]. Will d/c home with ativan.        Lottie Mussel, PA 01/23/12 [phone]

## 2012-01-23 NOTE — ED Provider Notes (Signed)
Medical screening examination/treatment/procedure(s) were performed by non-physician practitioner and as supervising physician I was immediately available for consultation/collaboration.  Juvon Teater L Ameila Weldon, MD 01/23/12 1614 

## 2012-01-25 ENCOUNTER — Ambulatory Visit (INDEPENDENT_AMBULATORY_CARE_PROVIDER_SITE_OTHER): Payer: BC Managed Care – PPO | Admitting: Family Medicine

## 2012-01-25 ENCOUNTER — Encounter: Payer: Self-pay | Admitting: Family Medicine

## 2012-01-25 VITALS — BP 122/70 | HR 76 | Temp 98.2°F | Wt 174.8 lb

## 2012-01-25 DIAGNOSIS — F418 Other specified anxiety disorders: Secondary | ICD-10-CM | POA: Insufficient documentation

## 2012-01-25 DIAGNOSIS — R102 Pelvic and perineal pain: Secondary | ICD-10-CM

## 2012-01-25 DIAGNOSIS — F1010 Alcohol abuse, uncomplicated: Secondary | ICD-10-CM

## 2012-01-25 DIAGNOSIS — F341 Dysthymic disorder: Secondary | ICD-10-CM

## 2012-01-25 DIAGNOSIS — R109 Unspecified abdominal pain: Secondary | ICD-10-CM

## 2012-01-25 DIAGNOSIS — N390 Urinary tract infection, site not specified: Secondary | ICD-10-CM

## 2012-01-25 MED ORDER — ESCITALOPRAM OXALATE 10 MG PO TABS: 10.0000 mg | ORAL_TABLET | Freq: Every day | ORAL | Status: DC

## 2012-01-25 NOTE — Patient Instructions (Addendum)

## 2012-01-25 NOTE — Assessment & Plan Note (Addendum)
lexapro 10 mg  Xanax prn rto 1 month or sooner prn

## 2012-01-25 NOTE — Assessment & Plan Note (Signed)
Has not had a drink since day before ER visit Counseling and AA

## 2012-01-25 NOTE — Progress Notes (Signed)
  Subjective:    Patient ID: [name] Ramos, female    DOB: 03-22-1966, 46 y.o.   MRN: [id number]  HPI Pt here f/u ER visit for etoh.  She was put on ativan but only took two.  She feels much better.  Pt is looking for a counselor and is going to get involved with AA.   No other new complaints.   Review of Systems    as above Objective:   Physical Exam  Constitutional: She is oriented to person, place, and time. She appears well-developed and well-nourished.  Neurological: She is alert and oriented to person, place, and time.  Psychiatric: She has a normal mood and affect. Her behavior is normal. Judgment and thought content normal.          Assessment & Plan:

## 2012-01-26 LAB — POCT URINALYSIS DIPSTICK
Bilirubin, UA: NEGATIVE
Glucose, UA: NEGATIVE
Nitrite, UA: NEGATIVE
Urobilinogen, UA: 0.2

## 2012-01-26 NOTE — Addendum Note (Signed)
Addended by: Silvio Pate D on: 01/26/2012 01:45 PM   Modules accepted: Orders

## 2012-01-28 LAB — URINE CULTURE: Colony Count: 30000

## 2012-03-11 ENCOUNTER — Ambulatory Visit: Payer: BC Managed Care – PPO | Admitting: Family Medicine

## 2012-03-25 ENCOUNTER — Telehealth: Payer: Self-pay | Admitting: Internal Medicine

## 2012-03-25 NOTE — Telephone Encounter (Signed)
Left message for pt to call back  °

## 2012-03-29 ENCOUNTER — Telehealth: Payer: Self-pay | Admitting: Family Medicine

## 2012-03-29 ENCOUNTER — Telehealth: Payer: Self-pay | Admitting: Internal Medicine

## 2012-03-29 NOTE — Telephone Encounter (Signed)
Left a message for patient to call me. 

## 2012-03-29 NOTE — Telephone Encounter (Signed)
Patient states she was taken to ED vis EMS today due to vomiting, diarrhea all night and "almost passed out in the shower." States she was taken to [facility] and given IVF. She has a history of gastroparesis per patient. She was told to f/u with GI MD. Scheduled patient with Willette Cluster, NP on 03/30/12 at 10:00 AM.

## 2012-03-29 NOTE — Telephone Encounter (Signed)
Call pt--- it doesn't look like she went to ER

## 2012-03-29 NOTE — Telephone Encounter (Signed)
Caller: Tenille/Patient; Patient Name: [name] Ramos; PCP: Lelon Perla.; Best Callback Phone Number: [phone] Pt calling regarding weakness and feeling like she was going to pass out in the shower this morning, 03/29/12. States she has gastroparesis and had runny diarrhea all night. Did take a Xanax earlier this am because she hadn't eaten and felt anxious. Had pt stand slowly;states she feels "very weak"; unable to stand comfortably. No one with pt. Emergent sx of Diarrhea or Other Change in Bowel Habits. Advised pt to call 911 now. Pt to do so now.

## 2012-03-29 NOTE — Telephone Encounter (Signed)
Per Gi note patient was seen at [facility] and she will be seeing the NP tomorrow at [facility].      KP

## 2012-03-30 ENCOUNTER — Ambulatory Visit: Payer: BC Managed Care – PPO | Admitting: Family Medicine

## 2012-03-30 ENCOUNTER — Telehealth: Payer: Self-pay | Admitting: Family Medicine

## 2012-03-30 ENCOUNTER — Ambulatory Visit: Payer: BC Managed Care – PPO | Admitting: Nurse Practitioner

## 2012-03-30 NOTE — Telephone Encounter (Signed)
Caller: [name] Ramos/Patient; Patient Name: [name] Ramos; PCP: Lelon Perla.; Best Callback Phone Number: [phone]. On 03/29/12,  Pt was seen at [facility] ED for alcohol intoxication.  Pt reports she had been drinking for a few day prior to ED.  Pt reports she is shaking really bad today (10/30) and was not given any Ativan and she wants to know if Dr Laury Axon will prescribe her some.  Pt reports she has had three drinks this am to try to stop the shaking.  Emergent symptom of 'History of delirium tremens (DTs) in past while withdrawing and beginning withdrawal' identified in the Withdrawal Symptoms.  RN advised for pt to be seen back at the ER.  Pt states she cannot afford to go back to the ER and just wants some medication called in for her.  RN advised patient that I would inform the MD of her symptoms, but the MD would probably want her to be seen in the office; pt again states that there is no way she can get there, the only thing she can do is have some go to [facility] to get the medication.  OFFICE PLEASE FOLLOW UP WITH THE PATIENT.

## 2012-03-30 NOTE — Telephone Encounter (Signed)
Discussed with patient and she declined the ED she said she just went yesterday, she will come in tomorrow to see Dr.Lowne. If she starts to feel bad or has DT's she will go to the ED.      KP

## 2012-03-30 NOTE — Telephone Encounter (Signed)
Pt has appt scheduled.

## 2012-03-30 NOTE — Telephone Encounter (Signed)
Please advise      KP 

## 2012-03-30 NOTE — Telephone Encounter (Signed)
Pt would have to be seen and really should go to ER ----sounds like she could be starting DTs

## 2012-03-31 ENCOUNTER — Other Ambulatory Visit: Payer: Self-pay | Admitting: Family Medicine

## 2012-03-31 ENCOUNTER — Encounter: Payer: Self-pay | Admitting: *Deleted

## 2012-03-31 ENCOUNTER — Ambulatory Visit (INDEPENDENT_AMBULATORY_CARE_PROVIDER_SITE_OTHER): Payer: BC Managed Care – PPO | Admitting: Family Medicine

## 2012-03-31 ENCOUNTER — Encounter: Payer: Self-pay | Admitting: Nurse Practitioner

## 2012-03-31 ENCOUNTER — Ambulatory Visit (INDEPENDENT_AMBULATORY_CARE_PROVIDER_SITE_OTHER): Payer: BC Managed Care – PPO | Admitting: Nurse Practitioner

## 2012-03-31 VITALS — BP 144/102 | HR 76 | Ht 64.25 in | Wt 169.2 lb

## 2012-03-31 VITALS — BP 152/100 | HR 73 | Temp 98.5°F | Wt 169.0 lb

## 2012-03-31 DIAGNOSIS — F10939 Alcohol use, unspecified with withdrawal, unspecified: Secondary | ICD-10-CM

## 2012-03-31 DIAGNOSIS — R112 Nausea with vomiting, unspecified: Secondary | ICD-10-CM

## 2012-03-31 DIAGNOSIS — F1010 Alcohol abuse, uncomplicated: Secondary | ICD-10-CM

## 2012-03-31 DIAGNOSIS — F341 Dysthymic disorder: Secondary | ICD-10-CM

## 2012-03-31 DIAGNOSIS — F10239 Alcohol dependence with withdrawal, unspecified: Secondary | ICD-10-CM

## 2012-03-31 DIAGNOSIS — F418 Other specified anxiety disorders: Secondary | ICD-10-CM

## 2012-03-31 MED ORDER — LORAZEPAM 1 MG PO TABS: 1.0000 mg | ORAL_TABLET | Freq: Four times a day (QID) | ORAL | Status: DC | PRN

## 2012-03-31 NOTE — Assessment & Plan Note (Signed)
Ativan 1 mg tid prn alcohol withdrawals Make appointment with psych htn elevated today---pt will monitor at home --if it does not come down with ativan she is to call us

## 2012-03-31 NOTE — Progress Notes (Signed)
  Subjective:    Patient ID: [name] Ramos, female    DOB: January 26, 1966, 46 y.o.   MRN: [id number]  HPI Pt here f/u ER at [facility] for alcohol withdrawal.  They treated her and wanted to keep her 72 hours but she refused so they did not give her any meds.  She is still shaking but is better.  We do not have records from ER.   No cp, sob, headaches.   Review of Systems As above    Objective:   Physical Exam  Constitutional: She is oriented to person, place, and time. She appears well-developed and well-nourished.  Cardiovascular: Normal rate and regular rhythm.   Pulmonary/Chest: Effort normal and breath sounds normal.  Neurological: She is alert and oriented to person, place, and time.       + hands shaking but she states it is better  Psychiatric: She has a normal mood and affect. Judgment and thought content normal.       Pt is motivated to get help and has a list of psych in [place]-- she will call and make an appointment           Assessment & Plan:

## 2012-03-31 NOTE — Patient Instructions (Addendum)
Alcohol Intoxication  You have alcohol intoxication when the amount of alcohol that you have consumed has impaired your ability to mentally and physically function. There are a variety of factors that contribute to the level at which alcohol intoxication can occur, such as age, gender, weight, frequency of alcohol consumption, medication use, and the presence of other medical conditions, such as diabetes, seizures, or heart conditions.  The blood alcohol level test measures the concentration of alcohol in your blood. In most states, your blood alcohol level must be lower than 80 mg/dL (0.08%) to legally drive. However, many dangerous effects of alcohol can occur at much lower levels.  Alcohol directly impairs the normal chemical activity of the brain and is said to be a chemical depressant. Alcohol can cause drowsiness, stupor, respiratory failure, and coma. Other physical effects can include headache, vomiting, vomiting of blood, abdominal pain, a fast heartbeat, difficulty breathing, anxiety, and amnesia. Alcohol intoxication can also lead to dangerous and life-threatening activities, such as fighting, dangerous operation of vehicles or heavy machinery, and risky sexual behavior.  Alcohol can be especially dangerous when taken with other drugs. Some of these drugs are:  · Sedatives.  · Painkillers.  · Marijuana.  · Tranquilizers.  · Antihistamines.  · Muscle relaxants.  · Seizure medicine.  Many of the effects of acute alcohol intoxication are temporary. However, repeated alcohol intoxication can lead to severe medical illnesses. If you have alcohol intoxication, you should:  · Stay hydrated. Drink enough water and fluids to keep your urine clear or pale yellow. Avoid excessive caffeine because this can further lead to dehydration.  · Eat a healthy diet. You may have residual nausea, headache, and loss of appetite, but it is still important that you maintain good nutrition. You can start with clear  liquids.  · Take nonsteroidal anti-inflammatory medications as needed for headaches, but make sure to do so with small meals. You should avoid acetaminophen for several days after having alcohol intoxication because the combination of alcohol and acetaminophen can be toxic to your liver.  If you have frequent alcohol intoxication, ask your friends and family if they think you have a drinking problem. For further help, contact:  · Your caregiver.  · Alcoholics Anonymous (AA).  · A drug or alcohol rehabilitation program.  SEEK MEDICAL CARE IF:   · You have persistent vomiting.  · You have persistent pain in any part of your body.  · You do not feel better after a few days.  SEEK IMMEDIATE MEDICAL CARE IF:   · You become shaky or tremble when you try to stop drinking.  · You shake uncontrollably (seizure).  · You throw up (vomit) blood. This may be bright red or it may look like black coffee grounds.  · You have blood in the stool. This may be bright red or appear as a black, tarry, bad smelling stool.  · You become lightheaded or faint.  ANY OF THESE SYMPTOMS MAY REPRESENT A SERIOUS PROBLEM THAT IS AN EMERGENCY. Do not wait to see if the symptoms will go away. Get medical help right away. Call your local emergency services (911 in [place] DO NOT drive yourself to the hospital.  MAKE SURE YOU:   · Understand these instructions.  · Will watch your condition.  · Will get help right away if you are not doing well or get worse.  Document Released: 02/25/2005 Document Revised: 08/10/2011 Document Reviewed: 11/04/2009  Exit[employer]® Patient Information ©2013 [employer].

## 2012-03-31 NOTE — Assessment & Plan Note (Signed)
?   Bipolar Get records from [facility] ER Will consider adding atypical to lexapro

## 2012-03-31 NOTE — Progress Notes (Signed)
03/31/2012 [name] Ramos [id number] Sep 14, 1965   History of Present Illness:  Patient is a 46 year old female known to Dr. Juanda Chance. She has history of GERD. Patient was last seen in June of this year at which time she presented with nausea ,vomiting, diarrhea and heartburn. Patient was felt to have acute gastroenteritis. Her PPI was temporarily increased to twice daily. She was given a bowel anti-spasmodic and an anti-emetic. Her symptoms resolved and she was fine until approximately one month ago when she developed recurrent nausea and vomiting. Patient states she had nausea and vomiting on a daily basis over the last month. A few days ago she developed diarrhea. No associated abdominal pain.  Patient was taken by EMS to the [facility] ED two days ago. I have requested the records. Apparently she had labs and an abdominal ultrasound. She received IV fluids and was told to followup with gastroenterology. Since leaving the ED patient has not had any further nausea, vomiting, or diarrhea, she held down a sandwich today.    Current Medications, Allergies, Past Medical History, Past Surgical History, Family History and Social History were reviewed in [employer] record.   Physical Exam: General: Well developed , white female in no acute distress Head: Normocephalic and atraumatic Eyes:  sclerae anicteric, conjunctiva pink  Ears: Normal auditory acuity Lungs: Clear throughout to auscultation Heart: Regular rate and rhythm Abdomen: Soft, non tender and non distended. No masses, no hepatomegaly. Normal bowel sounds Musculoskeletal: Symmetrical with no gross deformities  Extremities: No edema  Neurological: Alert oriented x 4, grossly nonfocal Psychological:  Alert and cooperative. Normal mood and affect  Assessment and Recommendations:  1.  Nausea, vomiting, diarrhea (mild).  Nausea with vomiting on a daily basis for one month. Diarrhea just started a few days ago. All of her  symptoms resolved yesterday. Patient went into DTs from ETOH withdrawal and went to [facility] ED two days ago (see PCP note). Following that, PCP put her on Ativan Q 6 hours. I am awaiting records from ED. It would be reasonable to think her nausea/ vomiting were from ETOH withdrawal except that she only recently stopped drinking and her symptoms have been present for a month. I am awaiting ED records and will let patient know if she needs any GI workup since symptoms are better. She is  tolerating solids today, bowels are normal.  2. alcohol abuse. The patient has discontinued alcohol now. She is on Ativan Q 6 hours for DTs. Her PCP is following closely. We discussed detrimental effects of ETOH .   Marland Kitchen

## 2012-04-01 ENCOUNTER — Telehealth: Payer: Self-pay

## 2012-04-01 ENCOUNTER — Telehealth: Payer: Self-pay | Admitting: *Deleted

## 2012-04-01 DIAGNOSIS — R112 Nausea with vomiting, unspecified: Secondary | ICD-10-CM | POA: Insufficient documentation

## 2012-04-01 DIAGNOSIS — R109 Unspecified abdominal pain: Secondary | ICD-10-CM

## 2012-04-01 MED ORDER — LISINOPRIL 10 MG PO TABS: 10.0000 mg | ORAL_TABLET | Freq: Every day | ORAL | Status: DC

## 2012-04-01 NOTE — Telephone Encounter (Signed)
Call from patient and she stated she had been checking her BP and it remains high, she is currently at the Urgent care an wanted to know what she should do. BP was 143/100 this am, after she got to work I was 187/118, and it is now 175/120. She is sitting in the UC parking lot, denied blurred visions, headaches or checst pain and said Dr.Lowne wanted her to call with BP. Please advise     KP

## 2012-04-01 NOTE — Telephone Encounter (Signed)
Received a call from Willette Cluster, NP with order to change patient's labs to be drawn to only draw LFT and schedule HIDA scan. Left a message for patient to call me. Scheduled HIDA at [facility] radiology(tony) 04/15/12 7:45/8:00 AM. NPO after midnight. No pain medications after midnight.

## 2012-04-01 NOTE — Telephone Encounter (Signed)
Discussed with Dr.Lowne and she stated to add Lisinopril 10 mg po qd, get rest and keep apt in 2 weeks. Patient voiced understanding-     Rx faxed      KP

## 2012-04-04 NOTE — Progress Notes (Signed)
In view of the duration of her symptoms I would consider upper endoscopy for further evaluation.  Reviewed and agree with management. Barbette Hair. Arlyce Dice, M.D., [facility]

## 2012-04-04 NOTE — Telephone Encounter (Signed)
Left a message for patient to call me. 

## 2012-04-05 NOTE — Telephone Encounter (Signed)
Patient notified of Willette Cluster, NP recommendations. Patient will pick up order for labs on the day of HIDA scan and have them done by [facility]. Patient given date/time/instructions for HIDA.

## 2012-04-05 NOTE — Telephone Encounter (Signed)
Left a message for patient to call me. 

## 2012-04-05 NOTE — Telephone Encounter (Signed)
Patient left a message to call her. Attempted to reach her. Left a message again.

## 2012-04-07 ENCOUNTER — Telehealth: Payer: Self-pay | Admitting: Internal Medicine

## 2012-04-07 NOTE — Telephone Encounter (Signed)
Patient cannot do HIDA scan on date scheduled. Gave patient the phone number to [facility] radiology to reschedule.

## 2012-04-14 ENCOUNTER — Ambulatory Visit: Payer: BC Managed Care – PPO | Admitting: Family Medicine

## 2012-04-15 ENCOUNTER — Ambulatory Visit (INDEPENDENT_AMBULATORY_CARE_PROVIDER_SITE_OTHER): Payer: BC Managed Care – PPO | Admitting: Family Medicine

## 2012-04-15 ENCOUNTER — Other Ambulatory Visit (HOSPITAL_COMMUNITY): Payer: BC Managed Care – PPO

## 2012-04-15 ENCOUNTER — Telehealth: Payer: Self-pay | Admitting: Internal Medicine

## 2012-04-15 ENCOUNTER — Encounter: Payer: Self-pay | Admitting: Family Medicine

## 2012-04-15 VITALS — BP 122/84 | HR 72 | Temp 98.1°F | Wt 169.6 lb

## 2012-04-15 DIAGNOSIS — Z309 Encounter for contraceptive management, unspecified: Secondary | ICD-10-CM

## 2012-04-15 DIAGNOSIS — F1010 Alcohol abuse, uncomplicated: Secondary | ICD-10-CM

## 2012-04-15 DIAGNOSIS — F341 Dysthymic disorder: Secondary | ICD-10-CM

## 2012-04-15 DIAGNOSIS — F418 Other specified anxiety disorders: Secondary | ICD-10-CM

## 2012-04-15 DIAGNOSIS — R259 Unspecified abnormal involuntary movements: Secondary | ICD-10-CM

## 2012-04-15 DIAGNOSIS — F419 Anxiety disorder, unspecified: Secondary | ICD-10-CM

## 2012-04-15 DIAGNOSIS — R251 Tremor, unspecified: Secondary | ICD-10-CM | POA: Insufficient documentation

## 2012-04-15 DIAGNOSIS — IMO0001 Reserved for inherently not codable concepts without codable children: Secondary | ICD-10-CM

## 2012-04-15 DIAGNOSIS — R300 Dysuria: Secondary | ICD-10-CM

## 2012-04-15 DIAGNOSIS — R32 Unspecified urinary incontinence: Secondary | ICD-10-CM

## 2012-04-15 DIAGNOSIS — F411 Generalized anxiety disorder: Secondary | ICD-10-CM

## 2012-04-15 LAB — POCT URINALYSIS DIPSTICK
Ketones, UA: NEGATIVE
Protein, UA: NEGATIVE
Spec Grav, UA: 1.02
Urobilinogen, UA: 0.2

## 2012-04-15 MED ORDER — MEDROXYPROGESTERONE ACETATE 150 MG/ML IM SUSP: INTRAMUSCULAR | Status: DC

## 2012-04-15 MED ORDER — OXYBUTYNIN CHLORIDE ER 10 MG PO TB24: 10.0000 mg | ORAL_TABLET | Freq: Every day | ORAL | Status: DC

## 2012-04-15 NOTE — Patient Instructions (Addendum)
Anxiety and Panic Attacks Your caregiver has informed you that you are having an anxiety or panic attack. There may be many forms of this. Most of the time these attacks come suddenly and without warning. They come at any time of day, including periods of sleep, and at any time of life. They may be strong and unexplained. Although panic attacks are very scary, they are physically harmless. Sometimes the cause of your anxiety is not known. Anxiety is a protective mechanism of the body in its fight or flight mechanism. Most of these perceived danger situations are actually nonphysical situations (such as anxiety over losing a job). CAUSES  The causes of an anxiety or panic attack are many. Panic attacks may occur in otherwise healthy people given a certain set of circumstances. There may be a genetic cause for panic attacks. Some medications may also have anxiety as a side effect. SYMPTOMS  Some of the most common feelings are:  Intense terror.  Dizziness, feeling faint.  Hot and cold flashes.  Fear of going crazy.  Feelings that nothing is real.  Sweating.  Shaking.  Chest pain or a fast heartbeat (palpitations).  Smothering, choking sensations.  Feelings of impending doom and that death is near.  Tingling of extremities, this may be from over-breathing.  Altered reality (derealization).  Being detached from yourself (depersonalization). Several symptoms can be present to make up anxiety or panic attacks. DIAGNOSIS  The evaluation by your caregiver will depend on the type of symptoms you are experiencing. The diagnosis of anxiety or panic attack is made when no physical illness can be determined to be a cause of the symptoms. TREATMENT  Treatment to prevent anxiety and panic attacks may include:  Avoidance of circumstances that cause anxiety.  Reassurance and relaxation.  Regular exercise.  Relaxation therapies, such as yoga.  Psychotherapy with a psychiatrist or  therapist.  Avoidance of caffeine, alcohol and illegal drugs.  Prescribed medication. SEEK IMMEDIATE MEDICAL CARE IF:   You experience panic attack symptoms that are different than your usual symptoms.  You have any worsening or concerning symptoms. Document Released: 05/18/2005 Document Revised: 08/10/2011 Document Reviewed: 09/19/2009 [facility] Patient Information 2013 [place], [place].   Tremor Tremor is a rhythmic, involuntary muscular contraction characterized by oscillations (to-and-fro movements) of a part of the body. The most common of all involuntary movements, tremor can affect various body parts such as the hands, head, facial structures, vocal cords, trunk, and legs; most tremors, however, occur in the hands. Tremor often accompanies neurological disorders associated with aging. Although the disorder is not life-threatening, it can be responsible for functional disability and social embarrassment. TREATMENT  There are many types of tremor and several ways in which tremor is classified. The most common classification is by behavioral context or position. There are five categories of tremor within this classification: resting, postural, kinetic, task-specific, and psychogenic. Resting or static tremor occurs when the muscle is at rest, for example when the hands are lying on the lap. This type of tremor is often seen in patients with Parkinson's disease. Postural tremor occurs when a patient attempts to maintain posture, such as holding the hands outstretched. Postural tremors include physiological tremor, essential tremor, tremor with basal ganglia disease (also seen in patients with Parkinson's disease), cerebellar postural tremor, tremor with peripheral neuropathy, post-traumatic tremor, and alcoholic tremor. Kinetic or intention (action) tremor occurs during purposeful movement, for example during finger-to-nose testing. Task-specific tremor appears when performing goal-oriented tasks  such as handwriting, speaking, or  standing. This group consists of primary writing tremor, vocal tremor, and orthostatic tremor. Psychogenic tremor occurs in both older and younger patients. The key feature of this tremor is that it dramatically lessens or disappears when the patient is distracted. PROGNOSIS There are some treatment options available for tremor; the appropriate treatment depends on accurate diagnosis of the cause. Some tremors respond to treatment of the underlying condition, for example in some cases of psychogenic tremor treating the patient's underlying mental problem may cause the tremor to disappear. Also, patients with tremor due to Parkinson's disease may be treated with Levodopa drug therapy. Symptomatic drug therapy is available for several other tremors as well. For those cases of tremor in which there is no effective drug treatment, physical measures such as teaching the patient to brace the affected limb during the tremor are sometimes useful. Surgical intervention such as thalamotomy or deep brain stimulation may be useful in certain cases. Document Released: 05/08/2002 Document Revised: 08/10/2011 Document Reviewed: 05/18/2005 [facility] Patient Information 2013 [place], [place].

## 2012-04-15 NOTE — Assessment & Plan Note (Signed)
Slightly better than just after she quick drinking We refer to neuro if it does not completely go away

## 2012-04-15 NOTE — Telephone Encounter (Signed)
Spoke with patient and she is asking for paper to get her labs done at [facility]. This has been left up front for pick up several days ago. Patient also asking for note to cover being out of work since September. Told patient we could not write her out for time before our OV.

## 2012-04-15 NOTE — Progress Notes (Signed)
  Subjective:    Patient ID: [name] Ramos, female    DOB: 05-May-1966, 46 y.o.   MRN: [id number]  HPI Pt here f/u alcohol and anxiety.  She is doing much better but still has shakes.   She said her mother and brother both have a nervous condition with tremor and she is afraid it might be that.  No other complaints.  She has not been able to make appointment with counselor/ psych because of work.   Review of Systems As above    Objective:   Physical Exam  Nursing note and vitals reviewed. Constitutional: She is oriented to person, place, and time. She appears well-developed and well-nourished.  Cardiovascular: Normal rate and normal heart sounds.   Pulmonary/Chest: Effort normal and breath sounds normal. No respiratory distress. She has no wheezes.  Musculoskeletal: Normal range of motion.  Neurological: She is alert and oriented to person, place, and time.       + tremor in hands L > R   Psychiatric: She has a normal mood and affect. Her behavior is normal. Judgment and thought content normal.          Assessment & Plan:

## 2012-04-15 NOTE — Assessment & Plan Note (Signed)
con't meds Much improved

## 2012-04-15 NOTE — Assessment & Plan Note (Signed)
Still needs to f/u counseling / psych Doing much better

## 2012-04-18 LAB — URINE CULTURE

## 2012-04-20 ENCOUNTER — Ambulatory Visit: Payer: BC Managed Care – PPO | Admitting: Internal Medicine

## 2012-05-03 ENCOUNTER — Encounter (HOSPITAL_COMMUNITY)
Admission: RE | Admit: 2012-05-03 | Discharge: 2012-05-03 | Disposition: A | Discharge status: Home / Self Care | Payer: BC Managed Care – PPO | Source: Ambulatory Visit | Attending: Nurse Practitioner | Admitting: Nurse Practitioner

## 2012-05-03 ENCOUNTER — Telehealth: Payer: Self-pay | Admitting: *Deleted

## 2012-05-03 DIAGNOSIS — R109 Unspecified abdominal pain: Secondary | ICD-10-CM | POA: Insufficient documentation

## 2012-05-03 MED ORDER — TECHNETIUM TC 99M MEBROFENIN IV KIT
5.5000 | PACK | Freq: Once | INTRAVENOUS | Status: AC | PRN
  Administered 2012-05-03: 6 via INTRAVENOUS

## 2012-05-03 NOTE — Telephone Encounter (Signed)
[name] Ramos, please let her know that HIDA was okay. We need repeat LFTs as her's were quite elevated ( records scanned) in [place]. She also needs to be seen because of the nausea and vomiting. Please make sure she isn't drinking ETOH. Thanks, please convert this to a note. ----- Message ----- From: Daphine Deutscher, RN Sent: 05/03/2012 10:05 AM To: Meredith Pel, NP Gunnar Fusi, Just FYI for you. This patient had her HIDA scan today. She did not have her follow up LFT's and she no showed her OV with Dr. Juanda Chance. [name] Left a message for patient to call me.

## 2012-05-06 NOTE — Telephone Encounter (Signed)
Patient called and gave her results of HIDA. She states she will get the labs drawn and sent to [place]. She understands she is not to drink ETOH. She states she did not think she needed the follow up OV. Explained that we needed her to get the labs and that she may need a f/u OV.

## 2012-05-17 ENCOUNTER — Telehealth: Payer: Self-pay | Admitting: *Deleted

## 2012-05-17 ENCOUNTER — Encounter: Payer: Self-pay | Admitting: *Deleted

## 2012-05-17 NOTE — Telephone Encounter (Signed)
Message copied by Daphine Deutscher on Tue May 17, 2012  9:06 AM ------      Message from: Daphine Deutscher      Created: Fri May 06, 2012  9:05 AM       Did patient get LFT for PG

## 2012-05-17 NOTE — Telephone Encounter (Signed)
Patient has not had labs drawn that were due in November. Patient sent a letter requesting she have labs done.

## 2012-07-18 ENCOUNTER — Telehealth: Payer: Self-pay | Admitting: Family Medicine

## 2012-07-18 DIAGNOSIS — K219 Gastro-esophageal reflux disease without esophagitis: Secondary | ICD-10-CM

## 2012-07-18 MED ORDER — OMEPRAZOLE 20 MG PO CPDR: 20.0000 mg | DELAYED_RELEASE_CAPSULE | Freq: Every day | ORAL | Status: DC

## 2012-07-18 NOTE — Telephone Encounter (Signed)
Refill: Omeprazole dr 20mg  capsule. Take 1 capsule by mouth every day. Qty 90. Last fill 12-08-11

## 2012-07-18 NOTE — Addendum Note (Signed)
Addended by: Arnette Norris on: 07/18/2012 05:31 PM   Modules accepted: Orders

## 2012-07-19 ENCOUNTER — Telehealth: Payer: Self-pay | Admitting: Family Medicine

## 2012-07-19 MED ORDER — ATENOLOL 100 MG PO TABS: ORAL_TABLET | ORAL | Status: DC

## 2012-07-19 NOTE — Telephone Encounter (Signed)
refill Atenolol (Tab) 100 MG TAKE 1 TABLET (100 MG TOTAL) BY MOUTH DAILY.  #90 last fill 11.1.13

## 2013-02-01 ENCOUNTER — Other Ambulatory Visit: Payer: Self-pay | Admitting: General Practice

## 2013-02-01 MED ORDER — ATENOLOL 100 MG PO TABS: ORAL_TABLET | ORAL | Status: DC

## 2013-04-10 LAB — HM PAP SMEAR

## 2013-05-01 ENCOUNTER — Encounter: Payer: Self-pay | Admitting: Family Medicine

## 2013-05-01 ENCOUNTER — Ambulatory Visit (INDEPENDENT_AMBULATORY_CARE_PROVIDER_SITE_OTHER): Payer: BC Managed Care – PPO | Admitting: Family Medicine

## 2013-05-01 VITALS — BP 122/72 | HR 57 | Temp 98.2°F | Wt 207.0 lb

## 2013-05-01 DIAGNOSIS — F341 Dysthymic disorder: Secondary | ICD-10-CM

## 2013-05-01 DIAGNOSIS — IMO0001 Reserved for inherently not codable concepts without codable children: Secondary | ICD-10-CM

## 2013-05-01 DIAGNOSIS — I10 Essential (primary) hypertension: Secondary | ICD-10-CM

## 2013-05-01 DIAGNOSIS — F411 Generalized anxiety disorder: Secondary | ICD-10-CM

## 2013-05-01 DIAGNOSIS — K219 Gastro-esophageal reflux disease without esophagitis: Secondary | ICD-10-CM

## 2013-05-01 DIAGNOSIS — F418 Other specified anxiety disorders: Secondary | ICD-10-CM

## 2013-05-01 DIAGNOSIS — Z309 Encounter for contraceptive management, unspecified: Secondary | ICD-10-CM

## 2013-05-01 DIAGNOSIS — E669 Obesity, unspecified: Secondary | ICD-10-CM | POA: Insufficient documentation

## 2013-05-01 DIAGNOSIS — F1010 Alcohol abuse, uncomplicated: Secondary | ICD-10-CM

## 2013-05-01 MED ORDER — OMEPRAZOLE 20 MG PO CPDR: 20.0000 mg | DELAYED_RELEASE_CAPSULE | Freq: Every day | ORAL | Status: DC

## 2013-05-01 MED ORDER — MEDROXYPROGESTERONE ACETATE 150 MG/ML IM SUSP: INTRAMUSCULAR | Status: DC

## 2013-05-01 MED ORDER — ALPRAZOLAM 0.25 MG PO TABS: 0.2500 mg | ORAL_TABLET | Freq: Three times a day (TID) | ORAL | Status: DC | PRN

## 2013-05-01 MED ORDER — ATENOLOL 100 MG PO TABS: ORAL_TABLET | ORAL | Status: DC

## 2013-05-01 NOTE — Patient Instructions (Signed)

## 2013-05-01 NOTE — Progress Notes (Signed)
Pre visit review using our clinic review tool, if applicable. No additional management support is needed unless otherwise documented below in the visit note. 

## 2013-05-01 NOTE — Progress Notes (Signed)
  Subjective:    Patient here for follow-up of elevated blood pressure.  She is not exercising and is adherent to a low-salt diet.  Blood pressure is well controlled at home. Cardiac symptoms: none. Patient denies: chest pain, chest pressure/discomfort, claudication, dyspnea, exertional chest pressure/discomfort, fatigue, irregular heart beat, lower extremity edema, near-syncope, orthopnea, palpitations, paroxysmal nocturnal dyspnea, syncope and tachypnea. Cardiovascular risk factors: hypertension, sedentary lifestyle and smoking/ tobacco exposure. Use of agents associated with hypertension: none. History of target organ damage: none.  The following portions of the patient's history were reviewed and updated as appropriate: allergies, current medications, past family history, past medical history, past social history, past surgical history and problem list.  Review of Systems Pertinent items are noted in HPI.     Objective:    Pulse 57  Temp(Src) 98.2 F (36.8 C) (Oral)  Wt 207 lb (93.895 kg)  SpO2 97% General appearance: alert, cooperative, appears stated age and no distress Neck: no adenopathy, no carotid bruit, no JVD, supple, symmetrical, trachea midline and thyroid not enlarged, symmetric, no tenderness/mass/nodules Lungs: clear to auscultation bilaterally Heart: S1, S2 normal Extremities: extremities normal, atraumatic, no cyanosis or edema    Assessment:    Hypertension, normal blood pressure . Evidence of target organ damage: none.    Plan:    Medication: no change. Dietary sodium restriction. Regular aerobic exercise. Check blood pressures 2-3 times weekly and record. Follow up: 6 months and as needed.

## 2013-05-02 NOTE — Assessment & Plan Note (Signed)
Doing well off meds Pt started new job F/u 6 months or sooner prn

## 2013-05-02 NOTE — Assessment & Plan Note (Signed)
Pt is no longer drinking

## 2013-11-14 ENCOUNTER — Telehealth: Payer: Self-pay | Admitting: Family Medicine

## 2013-11-14 NOTE — Telephone Encounter (Signed)
Patient Information:  Caller Name: [name] BucklerHeidi  Phone: [phone]  Patient: [name] Ramos Ramos, [name] Ramos  Gender: Female  DOB: 1966-05-21  Age: [age] Years  PCP: Lelon PerlaLowne, [name] R.  Pregnant: No  Office Follow Up:  Does the office need to follow up with this patient?: Yes  Instructions For The Office: Patient says she only goes to [facility] [place] office. Please  contact patient about appointment, can reach patient at  [phone].   Symptoms  Reason For Call & Symptoms: Bee sting, thinks wasp, x2 on Sunday night 6/14 -- this only 3rd time ever stung.  One sting between big and next toe and one sting on instep of left foot.  Foot was swollen and red by HS.  Today 6/16 swelling past ankle and swelling going up left calf.  Reviewed Health History In EMR: Yes  Reviewed Medications In EMR: Yes  Reviewed Allergies In EMR: Yes  Reviewed Surgeries / Procedures: Yes  Date of Onset of Symptoms: 11/12/2013 OB / GYN:  LMP: Unknown  Guideline(s) Used:  Bee Sting  Disposition Per Guideline:   See Today in Office  Reason For Disposition Reached:   Swelling is huge (e.g., > 4 inches or 10 cm, spreads beyond wrist or ankle)  Advice Given:  Apply Cold to the Area for Pain - Cold Pack Method:  Apply this cold pack to the area of the sting for 10-20 minutes.  You may repeat this as needed, to relieve symptoms of pain and swelling.  Call Back If:  You become worse.  Patient Will Follow Care Advice:  YES

## 2013-11-14 NOTE — Telephone Encounter (Signed)
Pt called in having an allergic reaction to a wasp sting.  Routed to CAN and left pt with a live person; instructed to stay on line until triaged.

## 2013-11-14 NOTE — Telephone Encounter (Signed)
All I can do without seeing it is tell her to get cortisone cream otc and use ice----- we would  have to see it to do more.

## 2013-11-14 NOTE — Telephone Encounter (Signed)
Spoke with patient who stated that she did not have insurance and does want to come in for an office visit. She "wants something called into her pharmacy for this." Patient stated that she was stung twice on the foot Sunday, the foot was red and swollen by the time she went to bed. Now the 'swelling is up her ankle with some clear fluid coming out of the toe where she was stung." I strongly encouraged the patient to make an appt to see Dr. Laury AxonLowne but she again refused. I additionally advised her to seek immediate medical attention if the swelling gets worse or if the oozing changed color. Educated of s/sx of infection, also told pt to call EMS if she developed shortness of breath or chest pain. Again encouraged her to see an physician but she refused due to lack of insurance.

## 2013-11-14 NOTE — Telephone Encounter (Signed)
Attempted to follow up with pt regarding allergic reaction to sting, unable to speak with pt. Left message on voice mail for her to return my call

## 2014-02-25 ENCOUNTER — Encounter: Payer: Self-pay | Admitting: Internal Medicine

## 2014-05-01 ENCOUNTER — Other Ambulatory Visit: Payer: Self-pay | Admitting: Family Medicine

## 2014-05-01 NOTE — Telephone Encounter (Signed)
Please offer this patient an apt for medication management for High blood pressure. Medication sent     KP

## 2014-05-21 ENCOUNTER — Encounter: Payer: Self-pay | Admitting: Cardiovascular Disease

## 2014-07-17 ENCOUNTER — Other Ambulatory Visit: Payer: Self-pay | Admitting: Family Medicine

## 2014-07-17 NOTE — Telephone Encounter (Signed)
Apt pending.      KP

## 2014-07-18 ENCOUNTER — Other Ambulatory Visit: Payer: Self-pay | Admitting: Family Medicine

## 2014-08-02 ENCOUNTER — Encounter: Payer: BC Managed Care – PPO | Admitting: Family Medicine

## 2014-08-23 ENCOUNTER — Telehealth: Payer: Self-pay | Admitting: *Deleted

## 2014-08-27 ENCOUNTER — Encounter: Payer: Self-pay | Admitting: *Deleted

## 2014-08-27 NOTE — Telephone Encounter (Signed)
Unable to reach patient at time of Pre-Visit Call.  Left message for patient to return call when available.    

## 2014-08-27 NOTE — Telephone Encounter (Signed)
Left voicemail for patient patient stating that chest pain would take priority over physical and that office visit tomorrow would be to address that issue.

## 2014-08-27 NOTE — Telephone Encounter (Signed)
She needs ov for chest pain

## 2014-08-27 NOTE — Addendum Note (Signed)
Addended by: Noreene LarssonLARSON, Mabry Santarelli A on: 08/27/2014 04:15 PM   Modules accepted: Medications

## 2014-08-27 NOTE — Telephone Encounter (Signed)
FYI-- Patient states she has been having chest pain for a month.  She does not know how to describe it, sometimes dull sometimes more painful, not pressure or tightness.  She does have jaw pain but she just had a tooth pulled.  No numbness, but she states she does get short of breath and lightheaded especially when climbing stairs.  She cannot recall if the pain and shortness of breath occur together.  She states it is worse at work and better when she is resting.  She thinks it is related to a muscle or heartburn and is requesting an increase of the Prilosec.  The pain is sometimes on the right and sometimes on the left, she is requesting a chest x-ray.    Notified patient that if she is having any chest pain / shortness of breath to go to Urgent Care or ED and patient refuses.  She states she has been to ED before with chest pain and they never find anything.

## 2014-08-28 ENCOUNTER — Ambulatory Visit (HOSPITAL_BASED_OUTPATIENT_CLINIC_OR_DEPARTMENT_OTHER)
Admission: RE | Admit: 2014-08-28 | Discharge: 2014-08-28 | Disposition: A | Discharge status: Home / Self Care | Payer: Managed Care, Other (non HMO) | Source: Ambulatory Visit | Attending: Family Medicine | Admitting: Family Medicine

## 2014-08-28 ENCOUNTER — Encounter: Payer: Self-pay | Admitting: Family Medicine

## 2014-08-28 ENCOUNTER — Ambulatory Visit (INDEPENDENT_AMBULATORY_CARE_PROVIDER_SITE_OTHER): Payer: Managed Care, Other (non HMO) | Admitting: Family Medicine

## 2014-08-28 VITALS — BP 118/78 | HR 49 | Temp 99.1°F | Ht 64.75 in | Wt 195.6 lb

## 2014-08-28 DIAGNOSIS — R0609 Other forms of dyspnea: Secondary | ICD-10-CM | POA: Diagnosis not present

## 2014-08-28 DIAGNOSIS — F411 Generalized anxiety disorder: Secondary | ICD-10-CM

## 2014-08-28 DIAGNOSIS — Z0000 Encounter for general adult medical examination without abnormal findings: Secondary | ICD-10-CM

## 2014-08-28 DIAGNOSIS — R05 Cough: Secondary | ICD-10-CM | POA: Insufficient documentation

## 2014-08-28 DIAGNOSIS — R0602 Shortness of breath: Secondary | ICD-10-CM | POA: Insufficient documentation

## 2014-08-28 DIAGNOSIS — R0600 Dyspnea, unspecified: Secondary | ICD-10-CM

## 2014-08-28 DIAGNOSIS — K219 Gastro-esophageal reflux disease without esophagitis: Secondary | ICD-10-CM | POA: Diagnosis not present

## 2014-08-28 DIAGNOSIS — R079 Chest pain, unspecified: Secondary | ICD-10-CM

## 2014-08-28 DIAGNOSIS — Z3009 Encounter for other general counseling and advice on contraception: Secondary | ICD-10-CM

## 2014-08-28 DIAGNOSIS — I10 Essential (primary) hypertension: Secondary | ICD-10-CM

## 2014-08-28 LAB — CBC WITH DIFFERENTIAL/PLATELET
BASOS PCT: 0.4 % (ref 0.0–3.0)
Basophils Absolute: 0 10*3/uL (ref 0.0–0.1)
Eosinophils Absolute: 0.1 10*3/uL (ref 0.0–0.7)
Eosinophils Relative: 0.7 % (ref 0.0–5.0)
HCT: 47 % — ABNORMAL HIGH (ref 36.0–46.0)
HEMOGLOBIN: 14.9 g/dL (ref 12.0–15.0)
LYMPHS ABS: 2.1 10*3/uL (ref 0.7–4.0)
LYMPHS PCT: 25.5 % (ref 12.0–46.0)
MCHC: 31.7 g/dL (ref 30.0–36.0)
MCV: 92.3 fl (ref 78.0–100.0)
MONO ABS: 0.5 10*3/uL (ref 0.1–1.0)
Monocytes Relative: 5.9 % (ref 3.0–12.0)
Neutro Abs: 5.7 10*3/uL (ref 1.4–7.7)
Neutrophils Relative %: 67.5 % (ref 43.0–77.0)
PLATELETS: 254 10*3/uL (ref 150.0–400.0)
RBC: 5.09 Mil/uL (ref 3.87–5.11)
RDW: 13.5 % (ref 11.5–15.5)
WBC: 8.4 10*3/uL (ref 4.0–10.5)

## 2014-08-28 LAB — BASIC METABOLIC PANEL
BUN: 10 mg/dL (ref 6–23)
CO2: 25 mEq/L (ref 19–32)
Calcium: 9.6 mg/dL (ref 8.4–10.5)
Chloride: 106 mEq/L (ref 96–112)
Creatinine, Ser: 0.78 mg/dL (ref 0.40–1.20)
GFR: 84.19 mL/min (ref >60.00)
Glucose, Bld: 89 mg/dL (ref 70–99)
Potassium: 4.1 mEq/L (ref 3.5–5.1)
Sodium: 138 mEq/L (ref 135–145)

## 2014-08-28 LAB — HEPATIC FUNCTION PANEL
ALBUMIN: 4.2 g/dL (ref 3.5–5.2)
ALK PHOS: 52 U/L (ref 39–117)
ALT: 17 U/L (ref 0–35)
AST: 16 U/L (ref 0–37)
BILIRUBIN TOTAL: 0.5 mg/dL (ref 0.2–1.2)
Bilirubin, Direct: 0.1 mg/dL (ref 0.0–0.3)
TOTAL PROTEIN: 7.2 g/dL (ref 6.0–8.3)

## 2014-08-28 LAB — LIPID PANEL
CHOLESTEROL: 204 mg/dL — AB (ref 0–200)
HDL: 47.2 mg/dL (ref >39.00)
LDL CALC: 143 mg/dL — AB (ref 0–99)
NonHDL: 156.8
Total CHOL/HDL Ratio: 4
Triglycerides: 68 mg/dL (ref 0.0–149.0)
VLDL: 13.6 mg/dL (ref 0.0–40.0)

## 2014-08-28 LAB — H. PYLORI ANTIBODY, IGG: H Pylori IgG: NEGATIVE

## 2014-08-28 MED ORDER — MEDROXYPROGESTERONE ACETATE 150 MG/ML IM SUSP: INTRAMUSCULAR | Status: DC

## 2014-08-28 MED ORDER — ATENOLOL 100 MG PO TABS: ORAL_TABLET | ORAL | Status: DC

## 2014-08-28 MED ORDER — ALPRAZOLAM 0.25 MG PO TABS: 0.2500 mg | ORAL_TABLET | Freq: Three times a day (TID) | ORAL | Status: DC | PRN

## 2014-08-28 MED ORDER — DEXLANSOPRAZOLE 60 MG PO CPDR: 60.0000 mg | DELAYED_RELEASE_CAPSULE | Freq: Every day | ORAL | Status: DC

## 2014-08-28 NOTE — Progress Notes (Signed)
Pre visit review using our clinic review tool, if applicable. No additional management support is needed unless otherwise documented below in the visit note. 

## 2014-08-28 NOTE — Progress Notes (Signed)
Patient ID: [name] Ramos, female    DOB: Mar 05, 1966  Age: 49 y.o. MRN: [id number]    Subjective:  Subjective HPI [name] Ramos presents for chest pain x several weeks.  Pain moves from side to side.  Relieved with burping and she is taking her prilosec.  He states she is also having sob with exertion that is new and sometimes occurs with chest pain.  Pt is concerned because of fam hx lung ca and heart dz.     Review of Systems  Constitutional: Negative for activity change, appetite change, fatigue and unexpected weight change.  Respiratory: Positive for chest tightness and shortness of breath. Negative for cough and wheezing.   Cardiovascular: Negative for chest pain and palpitations.  Neurological: Negative for dizziness, tremors, speech difficulty, weakness, light-headedness, numbness and headaches.  Psychiatric/Behavioral: Negative for behavioral problems and dysphoric mood. The patient is not nervous/anxious.     History Past Medical History  Diagnosis Date  . Alcohol abuse     recovering alcoholic  . Depression   . Anxiety   . Dyslipidemia     Followed by PCP  . PVC's (premature ventricular contractions)     Controlled on beta blocker therapy;  Echo 9/10: EF 55-60%, grade 1 diastolic dysfunction, mild MR, mild LAE  . Hiatal hernia     with reflux  . Cholecystitis, acute   . Obesity   . Urinary tract infection   . Tinea corporis   . GERD (gastroesophageal reflux disease)   . Thyroid nodule     hx  . Hematuria   . Congenital anomaly of neck   . Unspecified contraceptive management   . Allergic urticaria   . Kidney stone   . Hypertension     stress test 11/2010 normal  . Gastroparesis     She has past surgical history that includes Tonsillectomy and leep surgery.   Her family history includes Breast cancer in her mother; Colon polyps in her mother; Coronary artery disease in her other; Diabetes in her maternal grandmother and mother; Heart attack (age of onset:  37) in her maternal grandmother; Hypertension in her other; Irritable bowel syndrome in her mother; Liver disease in her mother; Lung cancer in her maternal grandmother, maternal uncle, and paternal grandmother; Thyroid disease in her mother.She reports that she has been smoking Cigarettes.  She has a 24 pack-year smoking history. She has never used smokeless tobacco. She reports that she drinks alcohol. She reports that she does not use illicit drugs.  Current Outpatient Prescriptions on File Prior to Visit  Medication Sig Dispense Refill  . [DISCONTINUED] oxybutynin (DITROPAN-XL) 10 MG 24 hr tablet Take 10 mg by mouth daily.     No current facility-administered medications on file prior to visit.     Objective:  Objective Physical Exam  Constitutional: She is oriented to person, place, and time. She appears well-developed and well-nourished. No distress.  HENT:  Right Ear: External ear normal.  Left Ear: External ear normal.  Nose: Nose normal.  Mouth/Throat: Oropharynx is clear and moist.  Eyes: EOM are normal. Pupils are equal, round, and reactive to light.  Neck: Normal range of motion. Neck supple.  Cardiovascular: Normal rate, regular rhythm and normal heart sounds.   No murmur heard. Pulmonary/Chest: Effort normal and breath sounds normal. No respiratory distress. She has no wheezes. She has no rales. She exhibits no tenderness.  Abdominal: Soft. Bowel sounds are normal. She exhibits no distension and no mass. There is no tenderness. There is  no rebound and no guarding.  Neurological: She is alert and oriented to person, place, and time.  Psychiatric: She has a normal mood and affect. Her behavior is normal. Judgment and thought content normal.   BP 118/78 mmHg  Pulse 49  Temp(Src) 99.1 F (37.3 C) (Oral)  Ht 5' 4.75" (1.645 m)  Wt 195 lb 9.6 oz (88.724 kg)  BMI 32.79 kg/m2  SpO2 99% Wt Readings from Last 3 Encounters:  08/28/14 195 lb 9.6 oz (88.724 kg)  05/01/13 207 lb  (93.895 kg)  04/15/12 169 lb 9.6 oz (76.93 kg)     Lab Results  Component Value Date   WBC 6.9 01/22/2012   HGB 15.6* 01/22/2012   HCT 43.9 01/22/2012   PLT 224 01/22/2012   GLUCOSE 112* 01/22/2012   CHOL 189 10/16/2010   TRIG 56.0 10/16/2010   HDL 64.70 10/16/2010   LDLDIRECT 148.1 01/20/2008   LDLCALC 113* 10/16/2010   ALT 76* 01/22/2012   AST 56* 01/22/2012   NA 134* 01/22/2012   K 3.8 01/22/2012   CL 98 01/22/2012   CREATININE 0.68 01/22/2012   BUN 12 01/22/2012   CO2 23 01/22/2012   TSH 0.89 10/16/2010    Dg Chest 2 View  08/28/2014   CLINICAL DATA:  Cough, shortness of breath, chest pain for 2 months.  EXAM: CHEST  2 VIEW  COMPARISON:  None.  FINDINGS: The heart size and mediastinal contours are within normal limits. Both lungs are clear. The visualized skeletal structures are unremarkable.  IMPRESSION: No active cardiopulmonary disease.   Electronically Signed   By: Charlett NoseKevin  Dover M.D.   On: 08/28/2014 12:20    EKG-- sinus brady Assessment & Plan:  Plan I have discontinued Ms. Mance's omeprazole. I am also having her start on dexlansoprazole. Additionally, I am having her maintain her atenolol, ALPRAZolam, and medroxyPROGESTERone.  Meds ordered this encounter  Medications  . dexlansoprazole (DEXILANT) 60 MG capsule    Sig: Take 1 capsule (60 mg total) by mouth daily.    Dispense:  30 capsule    Refill:  5  . atenolol (TENORMIN) 100 MG tablet    Sig: TAKE 1 TABLET (100 MG TOTAL) BY MOUTH DAILY.    Dispense:  90 tablet    Refill:  3  . ALPRAZolam (XANAX) 0.25 MG tablet    Sig: Take 1 tablet (0.25 mg total) by mouth 3 (three) times daily as needed for anxiety.    Dispense:  30 tablet    Refill:  0  . medroxyPROGESTERone (DEPO-PROVERA) 150 MG/ML injection    Sig: AS DIRECTED EVERY 3 MONTHS    Dispense:  1 mL    Refill:  11    Rx has expired - no refills remain    Problem List Items Addressed This Visit    GERD   Relevant Medications   dexlansoprazole  (DEXILANT) 60 MG capsule   Other Relevant Orders   H. pylori antibody, IgG   Basic metabolic panel   CBC with Differential/Platelet   Hepatic function panel    Other Visit Diagnoses    Chest pain, unspecified chest pain type    -  Primary    Relevant Orders    EKG 12-Lead (Completed)    DG Chest 2 View (Completed)    Echocardiogram stress test without contrast    DOE (dyspnea on exertion)        Relevant Orders    Echocardiogram stress test without contrast    Generalized anxiety disorder  Relevant Medications    ALPRAZolam  Prudy Feeler) tablet    Essential hypertension        Relevant Medications    atenolol (TENORMIN) tablet    Encounter for other general counseling or advice on contraception        Relevant Medications    medroxyPROGESTERone (DEPO-PROVERA) 150 MG/ML injection    Preventative health care        Relevant Orders    Lipid panel    POCT urinalysis dipstick       Follow-up: Return in about 3 months (around 11/28/2014), or if symptoms worsen or fail to improve, for cpe.  Loreen Freud, DO

## 2014-08-28 NOTE — Patient Instructions (Signed)

## 2014-08-31 ENCOUNTER — Other Ambulatory Visit (HOSPITAL_COMMUNITY): Payer: Managed Care, Other (non HMO)

## 2014-09-04 ENCOUNTER — Other Ambulatory Visit (HOSPITAL_COMMUNITY): Payer: Self-pay | Admitting: Family Medicine

## 2014-09-04 DIAGNOSIS — R079 Chest pain, unspecified: Secondary | ICD-10-CM

## 2014-09-05 ENCOUNTER — Other Ambulatory Visit (HOSPITAL_COMMUNITY): Payer: Managed Care, Other (non HMO)

## 2014-09-06 ENCOUNTER — Ambulatory Visit (HOSPITAL_COMMUNITY): Payer: Managed Care, Other (non HMO) | Attending: Cardiology

## 2014-09-06 DIAGNOSIS — R079 Chest pain, unspecified: Secondary | ICD-10-CM | POA: Diagnosis not present

## 2014-09-06 NOTE — Progress Notes (Signed)
Stress echo completed 09/06/2014

## 2014-09-11 ENCOUNTER — Telehealth: Payer: Self-pay | Admitting: Family Medicine

## 2014-09-11 NOTE — Telephone Encounter (Signed)
Caller name: Stephinie Relation to pt: self Call back number: [phone] Pharmacy:  Reason for call:   Patient states that her allergies are listed wrong. Patient thinks that we have her mothers allergies listed on her chart.

## 2014-09-12 NOTE — Telephone Encounter (Signed)
Answering for Dr. Laury AxonLowne who is out of office.  It seems that it was checked due to chest pain giving her history of GERD.  Sometimes pain is actually coming from GI system so she wanted to make sure there was no infection in stomach contributing.

## 2014-09-12 NOTE — Telephone Encounter (Signed)
Spoke with patient and she stated she got a bill for the H.pylori and she can not afford it. She wants to know why it was done. I explained for the GI issues she was having and she stated she was having chest pain. Please advise why the test was done because the patient would like to know from you.       KP

## 2014-09-13 NOTE — Telephone Encounter (Signed)
Msg left to call the office     KP 

## 2014-09-13 NOTE — Telephone Encounter (Signed)
Spoke with patient and I gave her the details of Cody's recommendations and she verbalized understanding, She is concerned about the 143 dollar bill and I advised if she gets a bill from us I will more than happy to give her the number to Billing and she agreed, she would call back to let us know.      KP

## 2014-10-22 ENCOUNTER — Telehealth: Payer: Self-pay | Admitting: Family Medicine

## 2014-10-22 NOTE — Telephone Encounter (Signed)
She needs to be seen.      KP 

## 2014-10-22 NOTE — Telephone Encounter (Signed)
Caller name: Ronny BaconHeidi Doto Relationship to patient: self Can be reached: [phone] [facility] [place]  Reason for call: Pt called to ask for an RX for toenail fungus. She states has been using OTC fungal treatment that hasn't worked. Pt states her toe is starting to hurt (big toe on left foot). Pt states toenail was ripped off years ago in an accident.

## 2014-10-23 NOTE — Telephone Encounter (Signed)
Left msg for pt to call and schedule appt

## 2014-11-08 ENCOUNTER — Telehealth: Payer: Self-pay | Admitting: Family Medicine

## 2014-11-08 NOTE — Telephone Encounter (Signed)
Pre Visit letter sent  °

## 2014-11-16 ENCOUNTER — Telehealth: Payer: Self-pay | Admitting: Cardiovascular Disease

## 2014-11-16 NOTE — Telephone Encounter (Signed)
New Message        Pt last saw Dr. Clifton James in April of 2013 and is wanting to know if she can be seen as an est pt instead of being a new pt seeing as how she is just a few months outside of the 3 year window. Please call back and advise.

## 2014-11-28 ENCOUNTER — Telehealth: Payer: Self-pay | Admitting: *Deleted

## 2014-11-28 NOTE — Telephone Encounter (Signed)
Unable to reach patient at time of Pre-Visit Call.  PVC call completed recently- left message to confirm appointment.

## 2014-11-29 ENCOUNTER — Ambulatory Visit (INDEPENDENT_AMBULATORY_CARE_PROVIDER_SITE_OTHER): Payer: Managed Care, Other (non HMO) | Admitting: Family Medicine

## 2014-11-29 ENCOUNTER — Other Ambulatory Visit (HOSPITAL_COMMUNITY)
Admission: RE | Admit: 2014-11-29 | Discharge: 2014-11-29 | Disposition: A | Discharge status: Home / Self Care | Payer: Managed Care, Other (non HMO) | Source: Ambulatory Visit | Attending: Family Medicine | Admitting: Family Medicine

## 2014-11-29 ENCOUNTER — Encounter: Payer: Self-pay | Admitting: Family Medicine

## 2014-11-29 VITALS — BP 118/82 | HR 53 | Temp 98.8°F | Ht 65.0 in | Wt 195.2 lb

## 2014-11-29 DIAGNOSIS — W19XXXA Unspecified fall, initial encounter: Secondary | ICD-10-CM

## 2014-11-29 DIAGNOSIS — R296 Repeated falls: Secondary | ICD-10-CM | POA: Diagnosis not present

## 2014-11-29 DIAGNOSIS — I493 Ventricular premature depolarization: Secondary | ICD-10-CM

## 2014-11-29 DIAGNOSIS — Z0000 Encounter for general adult medical examination without abnormal findings: Secondary | ICD-10-CM | POA: Diagnosis not present

## 2014-11-29 DIAGNOSIS — F411 Generalized anxiety disorder: Secondary | ICD-10-CM | POA: Diagnosis not present

## 2014-11-29 DIAGNOSIS — Z1151 Encounter for screening for human papillomavirus (HPV): Secondary | ICD-10-CM | POA: Diagnosis present

## 2014-11-29 DIAGNOSIS — Z01419 Encounter for gynecological examination (general) (routine) without abnormal findings: Secondary | ICD-10-CM | POA: Diagnosis present

## 2014-11-29 DIAGNOSIS — R319 Hematuria, unspecified: Secondary | ICD-10-CM

## 2014-11-29 DIAGNOSIS — L600 Ingrowing nail: Secondary | ICD-10-CM

## 2014-11-29 DIAGNOSIS — K219 Gastro-esophageal reflux disease without esophagitis: Secondary | ICD-10-CM

## 2014-11-29 DIAGNOSIS — Z124 Encounter for screening for malignant neoplasm of cervix: Secondary | ICD-10-CM

## 2014-11-29 MED ORDER — ATENOLOL 50 MG PO TABS: 50.0000 mg | ORAL_TABLET | Freq: Every day | ORAL | Status: DC

## 2014-11-29 MED ORDER — OMEPRAZOLE 40 MG PO CPDR: 40.0000 mg | DELAYED_RELEASE_CAPSULE | Freq: Every day | ORAL | Status: DC

## 2014-11-29 MED ORDER — ALPRAZOLAM 0.25 MG PO TABS: 0.2500 mg | ORAL_TABLET | Freq: Three times a day (TID) | ORAL | Status: DC | PRN

## 2014-11-29 NOTE — Patient Instructions (Addendum)
Premature Ventricular Contraction Premature ventricular contraction (PVC) is an irregularity of the heart rhythm involving extra or skipped heartbeats. In some cases, they may occur without obvious cause or heart disease. Other times, they can be caused by an electrolyte change in the blood. These need to be corrected. They can also be seen when there is not enough oxygen going to the heart. A common cause of this is plaque or cholesterol buildup. This buildup decreases the blood supply to the heart. In addition, extra beats may be caused or aggravated by:  Excessive smoking.  Alcohol consumption.  Caffeine.  Certain medications  Some street drugs. SYMPTOMS   The sensation of feeling your heart skipping a beat (palpitations).  In many cases, the person may have no symptoms. SIGNS AND TESTS   A physical examination may show an occasional irregularity, but if the PVC beats do not happen often, they may not be found on physical exam.  Blood pressure is usually normal.  Other tests that may find extra beats of the heart are:  An EKG (electrocardiogram)  A Holter monitor which can monitor your heart over longer periods of time  An Angiogram (study of the heart arteries). TREATMENT  Usually extra heartbeats do not need treatment. The condition is treated only if symptoms are severe or if extra beats are very frequent or are causing problems. An underlying cause, if discovered, may also require treatment.  Treatment may also be needed if there may be a risk for other more serious cardiac arrhythmias.  PREVENTION   Moderation in caffeine, alcohol, and tobacco use may reduce the risk of ectopic heartbeats in some people.  Exercise often helps people who lead a sedentary (inactive) lifestyle. PROGNOSIS  PVC heartbeats are generally harmless and do not need treatment.  RISKS AND COMPLICATIONS   Ventricular tachycardia (occasionally).  There usually are no complications.  Other  arrhythmias (occasionally). SEEK IMMEDIATE MEDICAL CARE IF:   You feel palpitations that are frequent or continual.  You develop chest pain or other problems such as shortness of breath, sweating, or nausea and vomiting.  You become light-headed or faint (pass out).  You get worse or do not improve with treatment. Document Released: 01/03/2004 Document Revised: 08/10/2011 Document Reviewed: 07/15/2007 [facility] Patient Information 2015 [place], [place]. This information is not intended to replace advice given to you by your health care provider. Make sure you discuss any questions you have with your health care provider. Preventive Care for Adults A healthy lifestyle and preventive care can promote health and wellness. Preventive health guidelines for women include the following key practices.  A routine yearly physical is a good way to check with your health care provider about your health and preventive screening. It is a chance to share any concerns and updates on your health and to receive a thorough exam.  Visit your dentist for a routine exam and preventive care every 6 months. Brush your teeth twice a day and floss once a day. Good oral hygiene prevents tooth decay and gum disease.  The frequency of eye exams is based on your age, health, family medical history, use of contact lenses, and other factors. Follow your health care provider's recommendations for frequency of eye exams.  Eat a healthy diet. Foods like vegetables, fruits, whole grains, low-fat dairy products, and lean protein foods contain the nutrients you need without too many calories. Decrease your intake of foods high in solid fats, added sugars, and salt. Eat the right amount of calories for  you.Get information about a proper diet from your health care provider, if necessary.  Regular physical exercise is one of the most important things you can do for your health. Most adults should get at least 150 minutes of  moderate-intensity exercise (any activity that increases your heart rate and causes you to sweat) each week. In addition, most adults need muscle-strengthening exercises on 2 or more days a week.  Maintain a healthy weight. The body mass index (BMI) is a screening tool to identify possible weight problems. It provides an estimate of body fat based on height and weight. Your health care provider can find your BMI and can help you achieve or maintain a healthy weight.For adults 20 years and older:  A BMI below 18.5 is considered underweight.  A BMI of 18.5 to 24.9 is normal.  A BMI of 25 to 29.9 is considered overweight.  A BMI of 30 and above is considered obese.  Maintain normal blood lipids and cholesterol levels by exercising and minimizing your intake of saturated fat. Eat a balanced diet with plenty of fruit and vegetables. Blood tests for lipids and cholesterol should begin at age 45 and be repeated every 5 years. If your lipid or cholesterol levels are high, you are over 50, or you are at high risk for heart disease, you may need your cholesterol levels checked more frequently.Ongoing high lipid and cholesterol levels should be treated with medicines if diet and exercise are not working.  If you smoke, find out from your health care provider how to quit. If you do not use tobacco, do not start.  Lung cancer screening is recommended for adults aged 55-80 years who are at high risk for developing lung cancer because of a history of smoking. A yearly low-dose CT scan of the lungs is recommended for people who have at least a 30-pack-year history of smoking and are a current smoker or have quit within the past 15 years. A pack year of smoking is smoking an average of 1 pack of cigarettes a day for 1 year (for example: 1 pack a day for 30 years or 2 packs a day for 15 years). Yearly screening should continue until the smoker has stopped smoking for at least 15 years. Yearly screening should be  stopped for people who develop a health problem that would prevent them from having lung cancer treatment.  If you are pregnant, do not drink alcohol. If you are breastfeeding, be very cautious about drinking alcohol. If you are not pregnant and choose to drink alcohol, do not have more than 1 drink per day. One drink is considered to be 12 ounces (355 mL) of beer, 5 ounces (148 mL) of wine, or 1.5 ounces (44 mL) of liquor.  Avoid use of street drugs. Do not share needles with anyone. Ask for help if you need support or instructions about stopping the use of drugs.  High blood pressure causes heart disease and increases the risk of stroke. Your blood pressure should be checked at least every 1 to 2 years. Ongoing high blood pressure should be treated with medicines if weight loss and exercise do not work.  If you are 56-41 years old, ask your health care provider if you should take aspirin to prevent strokes.  Diabetes screening involves taking a blood sample to check your fasting blood sugar level. This should be done once every 3 years, after age 77, if you are within normal weight and without risk factors for diabetes. Testing  should be considered at a younger age or be carried out more frequently if you are overweight and have at least 1 risk factor for diabetes.  Breast cancer screening is essential preventive care for women. You should practice "breast self-awareness." This means understanding the normal appearance and feel of your breasts and may include breast self-examination. Any changes detected, no matter how small, should be reported to a health care provider. Women in their 29s and 30s should have a clinical breast exam (CBE) by a health care provider as part of a regular health exam every 1 to 3 years. After age 60, women should have a CBE every year. Starting at age 54, women should consider having a mammogram (breast X-ray test) every year. Women who have a family history of breast  cancer should talk to their health care provider about genetic screening. Women at a high risk of breast cancer should talk to their health care providers about having an MRI and a mammogram every year.  Breast cancer gene (BRCA)-related cancer risk assessment is recommended for women who have family members with BRCA-related cancers. BRCA-related cancers include breast, ovarian, tubal, and peritoneal cancers. Having family members with these cancers may be associated with an increased risk for harmful changes (mutations) in the breast cancer genes BRCA1 and BRCA2. Results of the assessment will determine the need for genetic counseling and BRCA1 and BRCA2 testing.  Routine pelvic exams to screen for cancer are no longer recommended for nonpregnant women who are considered low risk for cancer of the pelvic organs (ovaries, uterus, and vagina) and who do not have symptoms. Ask your health care provider if a screening pelvic exam is right for you.  If you have had past treatment for cervical cancer or a condition that could lead to cancer, you need Pap tests and screening for cancer for at least 20 years after your treatment. If Pap tests have been discontinued, your risk factors (such as having a new sexual partner) need to be reassessed to determine if screening should be resumed. Some women have medical problems that increase the chance of getting cervical cancer. In these cases, your health care provider may recommend more frequent screening and Pap tests.  The HPV test is an additional test that may be used for cervical cancer screening. The HPV test looks for the virus that can cause the cell changes on the cervix. The cells collected during the Pap test can be tested for HPV. The HPV test could be used to screen women aged 80 years and older, and should be used in women of any age who have unclear Pap test results. After the age of 56, women should have HPV testing at the same frequency as a Pap  test.  Colorectal cancer can be detected and often prevented. Most routine colorectal cancer screening begins at the age of 28 years and continues through age 32 years. However, your health care provider may recommend screening at an earlier age if you have risk factors for colon cancer. On a yearly basis, your health care provider may provide home test kits to check for hidden blood in the stool. Use of a small camera at the end of a tube, to directly examine the colon (sigmoidoscopy or colonoscopy), can detect the earliest forms of colorectal cancer. Talk to your health care provider about this at age 28, when routine screening begins. Direct exam of the colon should be repeated every 5-10 years through age 46 years, unless early forms of  pre-cancerous polyps or small growths are found.  People who are at an increased risk for hepatitis B should be screened for this virus. You are considered at high risk for hepatitis B if:  You were born in a country where hepatitis B occurs often. Talk with your health care provider about which countries are considered high risk.  Your parents were born in a high-risk country and you have not received a shot to protect against hepatitis B (hepatitis B vaccine).  You have HIV or AIDS.  You use needles to inject street drugs.  You live with, or have sex with, someone who has hepatitis B.  You get hemodialysis treatment.  You take certain medicines for conditions like cancer, organ transplantation, and autoimmune conditions.  Hepatitis C blood testing is recommended for all people born from [birth year] through 1963 and any individual with known risks for hepatitis C.  Practice safe sex. Use condoms and avoid high-risk sexual practices to reduce the spread of sexually transmitted infections (STIs). STIs include gonorrhea, chlamydia, syphilis, trichomonas, herpes, HPV, and human immunodeficiency virus (HIV). Herpes, HIV, and HPV are viral illnesses that have no cure.  They can result in disability, cancer, and death.  You should be screened for sexually transmitted illnesses (STIs) including gonorrhea and chlamydia if:  You are sexually active and are younger than 24 years.  You are older than 24 years and your health care provider tells you that you are at risk for this type of infection.  Your sexual activity has changed since you were last screened and you are at an increased risk for chlamydia or gonorrhea. Ask your health care provider if you are at risk.  If you are at risk of being infected with HIV, it is recommended that you take a prescription medicine daily to prevent HIV infection. This is called preexposure prophylaxis (PrEP). You are considered at risk if:  You are a heterosexual woman, are sexually active, and are at increased risk for HIV infection.  You take drugs by injection.  You are sexually active with a partner who has HIV.  Talk with your health care provider about whether you are at high risk of being infected with HIV. If you choose to begin PrEP, you should first be tested for HIV. You should then be tested every 3 months for as long as you are taking PrEP.  Osteoporosis is a disease in which the bones lose minerals and strength with aging. This can result in serious bone fractures or breaks. The risk of osteoporosis can be identified using a bone density scan. Women ages 15 years and over and women at risk for fractures or osteoporosis should discuss screening with their health care providers. Ask your health care provider whether you should take a calcium supplement or vitamin D to reduce the rate of osteoporosis.  Menopause can be associated with physical symptoms and risks. Hormone replacement therapy is available to decrease symptoms and risks. You should talk to your health care provider about whether hormone replacement therapy is right for you.  Use sunscreen. Apply sunscreen liberally and repeatedly throughout the day.  You should seek shade when your shadow is shorter than you. Protect yourself by wearing long sleeves, pants, a wide-brimmed hat, and sunglasses year round, whenever you are outdoors.  Once a month, do a whole body skin exam, using a mirror to look at the skin on your back. Tell your health care provider of new moles, moles that have irregular borders, moles that  are larger than a pencil eraser, or moles that have changed in shape or color.  Stay current with required vaccines (immunizations).  Influenza vaccine. All adults should be immunized every year.  Tetanus, diphtheria, and acellular pertussis (Td, Tdap) vaccine. Pregnant women should receive 1 dose of Tdap vaccine during each pregnancy. The dose should be obtained regardless of the length of time since the last dose. Immunization is preferred during the 27th-36th week of gestation. An adult who has not previously received Tdap or who does not know her vaccine status should receive 1 dose of Tdap. This initial dose should be followed by tetanus and diphtheria toxoids (Td) booster doses every 10 years. Adults with an unknown or incomplete history of completing a 3-dose immunization series with Td-containing vaccines should begin or complete a primary immunization series including a Tdap dose. Adults should receive a Td booster every 10 years.  Varicella vaccine. An adult without evidence of immunity to varicella should receive 2 doses or a second dose if she has previously received 1 dose. Pregnant females who do not have evidence of immunity should receive the first dose after pregnancy. This first dose should be obtained before leaving the health care facility. The second dose should be obtained 4-8 weeks after the first dose.  Human papillomavirus (HPV) vaccine. Females aged 13-26 years who have not received the vaccine previously should obtain the 3-dose series. The vaccine is not recommended for use in pregnant females. However, pregnancy  testing is not needed before receiving a dose. If a female is found to be pregnant after receiving a dose, no treatment is needed. In that case, the remaining doses should be delayed until after the pregnancy. Immunization is recommended for any person with an immunocompromised condition through the age of 45 years if she did not get any or all doses earlier. During the 3-dose series, the second dose should be obtained 4-8 weeks after the first dose. The third dose should be obtained 24 weeks after the first dose and 16 weeks after the second dose.  Zoster vaccine. One dose is recommended for adults aged 60 years or older unless certain conditions are present.  Measles, mumps, and rubella (MMR) vaccine. Adults born before [birth year] generally are considered immune to measles and mumps. Adults born in [birth year] or later should have 1 or more doses of MMR vaccine unless there is a contraindication to the vaccine or there is laboratory evidence of immunity to each of the three diseases. A routine second dose of MMR vaccine should be obtained at least 28 days after the first dose for students attending postsecondary schools, health care workers, or international travelers. People who received inactivated measles vaccine or an unknown type of measles vaccine during 1963-1967 should receive 2 doses of MMR vaccine. People who received inactivated mumps vaccine or an unknown type of mumps vaccine before 1979 and are at high risk for mumps infection should consider immunization with 2 doses of MMR vaccine. For females of childbearing age, rubella immunity should be determined. If there is no evidence of immunity, females who are not pregnant should be vaccinated. If there is no evidence of immunity, females who are pregnant should delay immunization until after pregnancy. Unvaccinated health care workers born before [birth year] who lack laboratory evidence of measles, mumps, or rubella immunity or laboratory confirmation of disease should  consider measles and mumps immunization with 2 doses of MMR vaccine or rubella immunization with 1 dose of MMR vaccine.  Pneumococcal 13-valent conjugate (PCV13) vaccine.  When indicated, a person who is uncertain of her immunization history and has no record of immunization should receive the PCV13 vaccine. An adult aged 41 years or older who has certain medical conditions and has not been previously immunized should receive 1 dose of PCV13 vaccine. This PCV13 should be followed with a dose of pneumococcal polysaccharide (PPSV23) vaccine. The PPSV23 vaccine dose should be obtained at least 8 weeks after the dose of PCV13 vaccine. An adult aged 60 years or older who has certain medical conditions and previously received 1 or more doses of PPSV23 vaccine should receive 1 dose of PCV13. The PCV13 vaccine dose should be obtained 1 or more years after the last PPSV23 vaccine dose.  Pneumococcal polysaccharide (PPSV23) vaccine. When PCV13 is also indicated, PCV13 should be obtained first. All adults aged 41 years and older should be immunized. An adult younger than age 79 years who has certain medical conditions should be immunized. Any person who resides in a nursing home or long-term care facility should be immunized. An adult smoker should be immunized. People with an immunocompromised condition and certain other conditions should receive both PCV13 and PPSV23 vaccines. People with human immunodeficiency virus (HIV) infection should be immunized as soon as possible after diagnosis. Immunization during chemotherapy or radiation therapy should be avoided. Routine use of PPSV23 vaccine is not recommended for American Indians, [place] Natives, or people younger than 65 years unless there are medical conditions that require PPSV23 vaccine. When indicated, people who have unknown immunization and have no record of immunization should receive PPSV23 vaccine. One-time revaccination 5 years after the first dose of PPSV23 is  recommended for people aged 19-64 years who have chronic kidney failure, nephrotic syndrome, asplenia, or immunocompromised conditions. People who received 1-2 doses of PPSV23 before age 25 years should receive another dose of PPSV23 vaccine at age 73 years or later if at least 5 years have passed since the previous dose. Doses of PPSV23 are not needed for people immunized with PPSV23 at or after age 19 years.  Meningococcal vaccine. Adults with asplenia or persistent complement component deficiencies should receive 2 doses of quadrivalent meningococcal conjugate (MenACWY-D) vaccine. The doses should be obtained at least 2 months apart. Microbiologists working with certain meningococcal bacteria, [place] recruits, people at risk during an outbreak, and people who travel to or live in countries with a high rate of meningitis should be immunized. A first-year college student up through age 59 years who is living in a residence hall should receive a dose if she did not receive a dose on or after her 16th birthday. Adults who have certain high-risk conditions should receive one or more doses of vaccine.  Hepatitis A vaccine. Adults who wish to be protected from this disease, have certain high-risk conditions, work with hepatitis A-infected animals, work in hepatitis A research labs, or travel to or work in countries with a high rate of hepatitis A should be immunized. Adults who were previously unvaccinated and who anticipate close contact with an international adoptee during the first 60 days after arrival in the [place] from a country with a high rate of hepatitis A should be immunized.  Hepatitis B vaccine. Adults who wish to be protected from this disease, have certain high-risk conditions, may be exposed to blood or other infectious body fluids, are household contacts or sex partners of hepatitis B positive people, are clients or workers in certain care facilities, or travel to or work in countries  with a high  rate of hepatitis B should be immunized.  Haemophilus influenzae type b (Hib) vaccine. A previously unvaccinated person with asplenia or sickle cell disease or having a scheduled splenectomy should receive 1 dose of Hib vaccine. Regardless of previous immunization, a recipient of a hematopoietic stem cell transplant should receive a 3-dose series 6-12 months after her successful transplant. Hib vaccine is not recommended for adults with HIV infection. Preventive Services / Frequency Ages 84 to 72 years  Blood pressure check.** / Every 1 to 2 years.  Lipid and cholesterol check.** / Every 5 years beginning at age 9.  Clinical breast exam.** / Every 3 years for women in their 51s and 81s.  BRCA-related cancer risk assessment.** / For women who have family members with a BRCA-related cancer (breast, ovarian, tubal, or peritoneal cancers).  Pap test.** / Every 2 years from ages 60 through 39. Every 3 years starting at age 98 through age 28 or 84 with a history of 3 consecutive normal Pap tests.  HPV screening.** / Every 3 years from ages 77 through ages 80 to 51 with a history of 3 consecutive normal Pap tests.  Hepatitis C blood test.** / For any individual with known risks for hepatitis C.  Skin self-exam. / Monthly.  Influenza vaccine. / Every year.  Tetanus, diphtheria, and acellular pertussis (Tdap, Td) vaccine.** / Consult your health care provider. Pregnant women should receive 1 dose of Tdap vaccine during each pregnancy. 1 dose of Td every 10 years.  Varicella vaccine.** / Consult your health care provider. Pregnant females who do not have evidence of immunity should receive the first dose after pregnancy.  HPV vaccine. / 3 doses over 6 months, if 51 and younger. The vaccine is not recommended for use in pregnant females. However, pregnancy testing is not needed before receiving a dose.  Measles, mumps, rubella (MMR) vaccine.** / You need at least 1 dose of MMR if you  were born in 1957 or later. You may also need a 2nd dose. For females of childbearing age, rubella immunity should be determined. If there is no evidence of immunity, females who are not pregnant should be vaccinated. If there is no evidence of immunity, females who are pregnant should delay immunization until after pregnancy.  Pneumococcal 13-valent conjugate (PCV13) vaccine.** / Consult your health care provider.  Pneumococcal polysaccharide (PPSV23) vaccine.** / 1 to 2 doses if you smoke cigarettes or if you have certain conditions.  Meningococcal vaccine.** / 1 dose if you are age 24 to 63 years and a Market researcher living in a residence hall, or have one of several medical conditions, you need to get vaccinated against meningococcal disease. You may also need additional booster doses.  Hepatitis A vaccine.** / Consult your health care provider.  Hepatitis B vaccine.** / Consult your health care provider.  Haemophilus influenzae type b (Hib) vaccine.** / Consult your health care provider. Ages 60 to 69 years  Blood pressure check.** / Every 1 to 2 years.  Lipid and cholesterol check.** / Every 5 years beginning at age 102 years.  Lung cancer screening. / Every year if you are aged 29-80 years and have a 30-pack-year history of smoking and currently smoke or have quit within the past 15 years. Yearly screening is stopped once you have quit smoking for at least 15 years or develop a health problem that would prevent you from having lung cancer treatment.  Clinical breast exam.** / Every year after age 56 years.  BRCA-related cancer risk  assessment.** / For women who have family members with a BRCA-related cancer (breast, ovarian, tubal, or peritoneal cancers).  Mammogram.** / Every year beginning at age 11 years and continuing for as long as you are in good health. Consult with your health care provider.  Pap test.** / Every 3 years starting at age 31 years through age 72 or  28 years with a history of 3 consecutive normal Pap tests.  HPV screening.** / Every 3 years from ages 34 years through ages 74 to 7 years with a history of 3 consecutive normal Pap tests.  Fecal occult blood test (FOBT) of stool. / Every year beginning at age 28 years and continuing until age 59 years. You may not need to do this test if you get a colonoscopy every 10 years.  Flexible sigmoidoscopy or colonoscopy.** / Every 5 years for a flexible sigmoidoscopy or every 10 years for a colonoscopy beginning at age 62 years and continuing until age 88 years.  Hepatitis C blood test.** / For all people born from [birth year] through 1963 and any individual with known risks for hepatitis C.  Skin self-exam. / Monthly.  Influenza vaccine. / Every year.  Tetanus, diphtheria, and acellular pertussis (Tdap/Td) vaccine.** / Consult your health care provider. Pregnant women should receive 1 dose of Tdap vaccine during each pregnancy. 1 dose of Td every 10 years.  Varicella vaccine.** / Consult your health care provider. Pregnant females who do not have evidence of immunity should receive the first dose after pregnancy.  Zoster vaccine.** / 1 dose for adults aged 87 years or older.  Measles, mumps, rubella (MMR) vaccine.** / You need at least 1 dose of MMR if you were born in 1957 or later. You may also need a 2nd dose. For females of childbearing age, rubella immunity should be determined. If there is no evidence of immunity, females who are not pregnant should be vaccinated. If there is no evidence of immunity, females who are pregnant should delay immunization until after pregnancy.  Pneumococcal 13-valent conjugate (PCV13) vaccine.** / Consult your health care provider.  Pneumococcal polysaccharide (PPSV23) vaccine.** / 1 to 2 doses if you smoke cigarettes or if you have certain conditions.  Meningococcal vaccine.** / Consult your health care provider.  Hepatitis A vaccine.** / Consult your health care  provider.  Hepatitis B vaccine.** / Consult your health care provider.  Haemophilus influenzae type b (Hib) vaccine.** / Consult your health care provider. Ages 59 years and over  Blood pressure check.** / Every 1 to 2 years.  Lipid and cholesterol check.** / Every 5 years beginning at age 9 years.  Lung cancer screening. / Every year if you are aged 11-80 years and have a 30-pack-year history of smoking and currently smoke or have quit within the past 15 years. Yearly screening is stopped once you have quit smoking for at least 15 years or develop a health problem that would prevent you from having lung cancer treatment.  Clinical breast exam.** / Every year after age 84 years.  BRCA-related cancer risk assessment.** / For women who have family members with a BRCA-related cancer (breast, ovarian, tubal, or peritoneal cancers).  Mammogram.** / Every year beginning at age 90 years and continuing for as long as you are in good health. Consult with your health care provider.  Pap test.** / Every 3 years starting at age 81 years through age 49 or 81 years with 3 consecutive normal Pap tests. Testing can be stopped between 65 and 70  years with 3 consecutive normal Pap tests and no abnormal Pap or HPV tests in the past 10 years.  HPV screening.** / Every 3 years from ages 1 years through ages 36 or 27 years with a history of 3 consecutive normal Pap tests. Testing can be stopped between 65 and 70 years with 3 consecutive normal Pap tests and no abnormal Pap or HPV tests in the past 10 years.  Fecal occult blood test (FOBT) of stool. / Every year beginning at age 62 years and continuing until age 93 years. You may not need to do this test if you get a colonoscopy every 10 years.  Flexible sigmoidoscopy or colonoscopy.** / Every 5 years for a flexible sigmoidoscopy or every 10 years for a colonoscopy beginning at age 74 years and continuing until age 40 years.  Hepatitis C blood test.** / For  all people born from [birth year] through 1963 and any individual with known risks for hepatitis C.  Osteoporosis screening.** / A one-time screening for women ages 66 years and over and women at risk for fractures or osteoporosis.  Skin self-exam. / Monthly.  Influenza vaccine. / Every year.  Tetanus, diphtheria, and acellular pertussis (Tdap/Td) vaccine.** / 1 dose of Td every 10 years.  Varicella vaccine.** / Consult your health care provider.  Zoster vaccine.** / 1 dose for adults aged 4 years or older.  Pneumococcal 13-valent conjugate (PCV13) vaccine.** / Consult your health care provider.  Pneumococcal polysaccharide (PPSV23) vaccine.** / 1 dose for all adults aged 7 years and older.  Meningococcal vaccine.** / Consult your health care provider.  Hepatitis A vaccine.** / Consult your health care provider.  Hepatitis B vaccine.** / Consult your health care provider.  Haemophilus influenzae type b (Hib) vaccine.** / Consult your health care provider. ** Family history and personal history of risk and conditions may change your health care provider's recommendations. Document Released: 07/14/2001 Document Revised: 10/02/2013 Document Reviewed: 10/13/2010 [facility] Patient Information 2015 [place], [place]. This information is not intended to replace advice given to you by your health care provider. Make sure you discuss any questions you have with your health care provider. Smoking Cessation Quitting smoking is important to your health and has many advantages. However, it is not always easy to quit since nicotine is a very addictive drug. Oftentimes, people try 3 times or more before being able to quit. This document explains the best ways for you to prepare to quit smoking. Quitting takes hard work and a lot of effort, but you can do it. ADVANTAGES OF QUITTING SMOKING  You will live longer, feel better, and live better.  Your body will feel the impact of quitting smoking almost  immediately.  Within 20 minutes, blood pressure decreases. Your pulse returns to its normal level.  After 8 hours, carbon monoxide levels in the blood return to normal. Your oxygen level increases.  After 24 hours, the chance of having a heart attack starts to decrease. Your breath, hair, and body stop smelling like smoke.  After 48 hours, damaged nerve endings begin to recover. Your sense of taste and smell improve.  After 72 hours, the body is virtually free of nicotine. Your bronchial tubes relax and breathing becomes easier.  After 2 to 12 weeks, lungs can hold more air. Exercise becomes easier and circulation improves.  The risk of having a heart attack, stroke, cancer, or lung disease is greatly reduced.  After 1 year, the risk of coronary heart disease is cut in half.  After  5 years, the risk of stroke falls to the same as a nonsmoker.  After 10 years, the risk of lung cancer is cut in half and the risk of other cancers decreases significantly.  After 15 years, the risk of coronary heart disease drops, usually to the level of a nonsmoker.  If you are pregnant, quitting smoking will improve your chances of having a healthy baby.  The people you live with, especially any children, will be healthier.  You will have extra money to spend on things other than cigarettes. QUESTIONS TO THINK ABOUT BEFORE ATTEMPTING TO QUIT You may want to talk about your answers with your health care provider.  Why do you want to quit?  If you tried to quit in the past, what helped and what did not?  What will be the most difficult situations for you after you quit? How will you plan to handle them?  Who can help you through the tough times? Your family? Friends? A health care provider?  What pleasures do you get from smoking? What ways can you still get pleasure if you quit? Here are some questions to ask your health care provider:  How can you help me to be successful at quitting?  What  medicine do you think would be best for me and how should I take it?  What should I do if I need more help?  What is smoking withdrawal like? How can I get information on withdrawal? GET READY  Set a quit date.  Change your environment by getting rid of all cigarettes, ashtrays, matches, and lighters in your home, car, or work. Do not let people smoke in your home.  Review your past attempts to quit. Think about what worked and what did not. GET SUPPORT AND ENCOURAGEMENT You have a better chance of being successful if you have help. You can get support in many ways.  Tell your family, friends, and coworkers that you are going to quit and need their support. Ask them not to smoke around you.  Get individual, group, or telephone counseling and support. Programs are available at [employer] and health centers. Call your local health department for information about programs in your area.  Spiritual beliefs and practices may help some smokers quit.  Download a "quit meter" on your computer to keep track of quit statistics, such as how long you have gone without smoking, cigarettes not smoked, and money saved.  Get a self-help book about quitting smoking and staying off tobacco. [place] yourself from urges to smoke. Talk to someone, go for a walk, or occupy your time with a task.  Change your normal routine. Take a different route to work. Drink tea instead of coffee. Eat breakfast in a different place.  Reduce your stress. Take a hot bath, exercise, or read a book.  Plan something enjoyable to do every day. Reward yourself for not smoking.  Explore interactive web-based programs that specialize in helping you quit. GET MEDICINE AND USE IT CORRECTLY Medicines can help you stop smoking and decrease the urge to smoke. Combining medicine with the above behavioral methods and support can greatly increase your chances of successfully quitting  smoking.  Nicotine replacement therapy helps deliver nicotine to your body without the negative effects and risks of smoking. Nicotine replacement therapy includes nicotine gum, lozenges, inhalers, nasal sprays, and skin patches. Some may be available over-the-counter and others require a prescription.  Antidepressant medicine helps people abstain from  smoking, but how this works is unknown. This medicine is available by prescription.  Nicotinic receptor partial agonist medicine simulates the effect of nicotine in your brain. This medicine is available by prescription. Ask your health care provider for advice about which medicines to use and how to use them based on your health history. Your health care provider will tell you what side effects to look out for if you choose to be on a medicine or therapy. Carefully read the information on the package. Do not use any other product containing nicotine while using a nicotine replacement product.  RELAPSE OR DIFFICULT SITUATIONS Most relapses occur within the first 3 months after quitting. Do not be discouraged if you start smoking again. Remember, most people try several times before finally quitting. You may have symptoms of withdrawal because your body is used to nicotine. You may crave cigarettes, be irritable, feel very hungry, cough often, get headaches, or have difficulty concentrating. The withdrawal symptoms are only temporary. They are strongest when you first quit, but they will go away within 10-14 days. To reduce the chances of relapse, try to:  Avoid drinking alcohol. Drinking lowers your chances of successfully quitting.  Reduce the amount of caffeine you consume. Once you quit smoking, the amount of caffeine in your body increases and can give you symptoms, such as a rapid heartbeat, sweating, and anxiety.  Avoid smokers because they can make you want to smoke.  Do not let weight gain distract you. Many smokers will gain weight when they  quit, usually less than 10 pounds. Eat a healthy diet and stay active. You can always lose the weight gained after you quit.  Find ways to improve your mood other than smoking. FOR MORE INFORMATION  www.smokefree.gov  Document Released: 05/12/2001 Document Revised: 10/02/2013 Document Reviewed: 08/27/2011 [facility] Patient Information 2015 [place], [place]. This information is not intended to replace advice given to you by your health care provider. Make sure you discuss any questions you have with your health care provider.

## 2014-11-29 NOTE — Progress Notes (Signed)
Subjective:     [name] Ramos is a 49 y.o. female and is here for a comprehensive physical exam. The patient reports problems with big toenails--- very painful.  She also c/o tripping over her own two feet and feeling very fatigued.     History   Social History  . Marital Status: Single    Spouse Name: N/A  . Number of Children: N/A  . 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 Smoking status: Current Every Day Smoker -- 0.50 packs/day for 24 years    Types: Cigarettes    Last Attempt to Quit: 07/02/2008  . Smokeless tobacco: Never Used  . Alcohol Use: 0.0 oz/week    0 Standard drinks or equivalent per week     Comment: drinks daily  . Drug Use: No  . Sexual Activity:    Partners: Male   Other Topics Concern  . Not on file   Social History Narrative   Exercising---walking dog daily   Health Maintenance  Topic Date Due  . HIV Screening  08/28/2035 (Originally 10/16/1982)  . INFLUENZA VACCINE  12/31/2014  . PAP SMEAR  04/10/2016  . TETANUS/TDAP  01/20/2017    The following portions of the patient's history were reviewed and updated as appropriate:  She  has a past medical history of Alcohol abuse; Depression; Anxiety; Dyslipidemia; PVC's (premature ventricular contractions); Hiatal hernia; Cholecystitis, acute; Obesity; Urinary tract infection; Tinea corporis; GERD (gastroesophageal reflux disease); Thyroid nodule; Hematuria; Congenital anomaly of neck; Unspecified contraceptive management; Allergic urticaria; Kidney stone; Hypertension; and Gastroparesis. She  does not have any pertinent problems on file. She  has past surgical history that includes Tonsillectomy and leep surgery. Her family history includes Breast cancer in her mother; Colon polyps in her mother; Coronary artery disease in her other; Diabetes in her maternal grandmother and mother; Heart attack (age of onset: 53) in her maternal  grandmother; Hypertension in her other; Irritable bowel syndrome in her mother; Liver disease in her mother; Lung cancer in her maternal grandmother, maternal uncle, and paternal grandmother; Thyroid disease in her mother. She  reports that she has been smoking Cigarettes.  She has a 12 pack-year smoking history. She has never used smokeless tobacco. She reports that she drinks alcohol. She reports that she does not use illicit drugs. She has a current medication list which includes the following prescription(s): alprazolam, medroxyprogesterone, atenolol, and omeprazole. Current Outpatient Prescriptions on File Prior to Visit  Medication Sig Dispense Refill  . medroxyPROGESTERone (DEPO-PROVERA) 150 MG/ML injection AS DIRECTED EVERY 3 MONTHS 1 mL 11  . [DISCONTINUED] oxybutynin (DITROPAN-XL) 10 MG 24 hr tablet Take 10 mg by mouth daily.     No current facility-administered medications on file prior to visit.   She is allergic to piroxicam..  Review of Systems Review of Systems  Constitutional: Negative for activity change, appetite change and fatigue.  HENT: Negative for hearing loss, congestion, tinnitus and ear discharge.  dentist q9m Eyes: Negative for visual disturbance (see optho q1y -- vision corrected to 20/20 with glasses).  Respiratory: Negative for cough, chest tightness and shortness of breath.   Cardiovascular: Negative for chest pain, palpitations and leg swelling.  Gastrointestinal: Negative for abdominal pain, diarrhea, constipation and abdominal distention.  Genitourinary: Negative for urgency, frequency, decreased urine volume and difficulty urinating.  Musculoskeletal: Negative for back pain, arthralgias and gait problem.  Skin: Negative for color change, pallor and rash.  Neurological: Negative for dizziness, light-headedness, numbness and headaches.  Hematological: Negative for adenopathy. Does not bruise/bleed easily.  Psychiatric/Behavioral: Negative for suicidal ideas,  confusion, sleep disturbance, self-injury, dysphoric mood, decreased concentration and agitation.      Objective:    BP 118/82 mmHg  Pulse 53  Temp(Src) 98.8 F (37.1 C) (Oral)  Ht 5\' 5"  (1.651 m)  Wt 195 lb 3.2 oz (88.542 kg)  BMI 32.48 kg/m2  SpO2 98% General appearance: alert, cooperative, appears stated age and no distress Head: Normocephalic, without obvious abnormality, atraumatic Eyes: conjunctivae/corneas clear. PERRL, EOM's intact. Fundi benign. Ears: normal TM's and external ear canals both ears Nose: Nares normal. Septum midline. Mucosa normal. No drainage or sinus tenderness. Throat: lips, mucosa, and tongue normal; teeth and gums normal Neck: no adenopathy, no carotid bruit, no JVD, supple, symmetrical, trachea midline and thyroid not enlarged, symmetric, no tenderness/mass/nodules Back: symmetric, no curvature. ROM normal. No CVA tenderness. Lungs: clear to auscultation bilaterally Breasts: normal appearance, no masses or tenderness Heart: regular rate and rhythm, S1, S2 normal, no murmur, click, rub or gallop Abdomen: soft, non-tender; bowel sounds normal; no masses,  no organomegaly Pelvic: cervix normal in appearance, external genitalia normal, no adnexal masses or tenderness, no cervical motion tenderness, rectovaginal septum normal, uterus normal size, shape, and consistency, vagina normal without discharge and pap done, rectal heme neg brown stool Extremities: extremities normal, atraumatic, no cyanosis or edema Pulses: 2+ and symmetric Skin: Skin color, texture, turgor normal. No rashes or lesions Lymph nodes: Cervical, supraclavicular, and axillary nodes normal. Neurologic: Alert and oriented X 3, normal strength and tone. Normal symmetric reflexes. Normal coordination and gait Psych- no depression, no anxiety     r foot big toenail --ingrown and tender Assessment:    Healthy female exam.      Plan:    ghm utd Check labs See After Visit Summary for  Counseling Recommendations    1. Generalized anxiety disorder Refill meds - ALPRAZolam (XANAX) 0.25 MG tablet; Take 1 tablet (0.25 mg total) by mouth 3 (three) times daily as needed for anxiety.  Dispense: 30 tablet; Refill: 0  2. Gastroesophageal reflux disease, esophagitis presence not specified   - omeprazole (PRILOSEC) 40 MG capsule; Take 1 capsule (40 mg total) by mouth daily.  Dispense: 90 capsule; Refill: 3  3. PVC (premature ventricular contraction)  Dec dose secondary to fatigue and low bp - atenolol (TENORMIN) 50 MG tablet; Take 1 tablet (50 mg total) by mouth daily.  Dispense: 90 tablet; Refill: 3  4. Falling episodes Dec atenolol, check labs - Vitamin B12  5. Preventative health care  - Basic metabolic panel - CBC with Differential/Platelet - Hepatic function panel - Lipid panel - POCT urinalysis dipstick - TSH  6. Ingrown toenail  - Ambulatory referral to Podiatry  7. Screening for malignant neoplasm of cervix  - Cytology - PAP

## 2014-11-29 NOTE — Progress Notes (Signed)
Pre visit review using our clinic review tool, if applicable. No additional management support is needed unless otherwise documented below in the visit note. 

## 2014-11-29 NOTE — Telephone Encounter (Signed)
Lmsg for pt to call back to schedule new pt appt with one of our cardiologists.

## 2014-11-30 LAB — POCT URINALYSIS DIPSTICK
Bilirubin, UA: NEGATIVE
Glucose, UA: NEGATIVE
Ketones, UA: NEGATIVE
LEUKOCYTES UA: NEGATIVE
Nitrite, UA: NEGATIVE
PH UA: 6
Protein, UA: NEGATIVE
SPEC GRAV UA: 1.015
Urobilinogen, UA: 4

## 2014-11-30 LAB — CBC WITH DIFFERENTIAL/PLATELET
BASOS ABS: 0 10*3/uL (ref 0.0–0.1)
Basophils Relative: 0.2 % (ref 0.0–3.0)
EOS ABS: 0.1 10*3/uL (ref 0.0–0.7)
EOS PCT: 1.1 % (ref 0.0–5.0)
HEMATOCRIT: 45.4 % (ref 36.0–46.0)
Hemoglobin: 14.5 g/dL (ref 12.0–15.0)
LYMPHS ABS: 2.5 10*3/uL (ref 0.7–4.0)
LYMPHS PCT: 24.3 % (ref 12.0–46.0)
MCHC: 32 g/dL (ref 30.0–36.0)
MCV: 98.5 fl (ref 78.0–100.0)
MONOS PCT: 3.1 % (ref 3.0–12.0)
Monocytes Absolute: 0.3 10*3/uL (ref 0.1–1.0)
NEUTROS ABS: 7.3 10*3/uL (ref 1.4–7.7)
Neutrophils Relative %: 71.3 % (ref 43.0–77.0)
Platelets: 236 10*3/uL (ref 150.0–400.0)
RBC: 4.61 Mil/uL (ref 3.87–5.11)
RDW: 15 % (ref 11.5–15.5)
WBC: 10.3 10*3/uL (ref 4.0–10.5)

## 2014-11-30 LAB — BASIC METABOLIC PANEL
BUN: 17 mg/dL (ref 6–23)
CO2: 22 meq/L (ref 19–32)
CREATININE: 1.14 mg/dL (ref 0.40–1.20)
Calcium: 10.1 mg/dL (ref 8.4–10.5)
Chloride: 101 mEq/L (ref 96–112)
GFR: 54.27 mL/min — ABNORMAL LOW (ref >60.00)
Glucose, Bld: <10 mg/dL — CL (ref 70–99)
POTASSIUM: 4.1 meq/L (ref 3.5–5.1)
SODIUM: 143 meq/L (ref 135–145)

## 2014-11-30 LAB — HEPATIC FUNCTION PANEL
ALBUMIN: 4.2 g/dL (ref 3.5–5.2)
ALT: 17 U/L (ref 0–35)
AST: 16 U/L (ref 0–37)
Alkaline Phosphatase: 57 U/L (ref 39–117)
BILIRUBIN DIRECT: 0.1 mg/dL (ref 0.0–0.3)
Total Bilirubin: 0.5 mg/dL (ref 0.2–1.2)
Total Protein: 7.4 g/dL (ref 6.0–8.3)

## 2014-11-30 LAB — TSH: TSH: 1.84 u[IU]/mL (ref 0.35–4.50)

## 2014-11-30 LAB — LIPID PANEL
CHOL/HDL RATIO: 5
CHOLESTEROL: 215 mg/dL — AB (ref 0–200)
HDL: 47.7 mg/dL (ref >39.00)
LDL Cholesterol: 151 mg/dL — ABNORMAL HIGH (ref 0–99)
NonHDL: 167.3
Triglycerides: 82 mg/dL (ref 0.0–149.0)
VLDL: 16.4 mg/dL (ref 0.0–40.0)

## 2014-11-30 LAB — VITAMIN B12: Vitamin B-12: 193 pg/mL — ABNORMAL LOW (ref 211–911)

## 2014-11-30 NOTE — Addendum Note (Signed)
Addended by: Silvio PateHOMPSON, Equilla Que D on: 11/30/2014 09:53 AM   Modules accepted: Orders

## 2014-12-02 LAB — URINE CULTURE
COLONY COUNT: NO GROWTH
Organism ID, Bacteria: NO GROWTH

## 2014-12-04 ENCOUNTER — Ambulatory Visit (INDEPENDENT_AMBULATORY_CARE_PROVIDER_SITE_OTHER): Payer: Managed Care, Other (non HMO) | Admitting: Podiatry

## 2014-12-04 ENCOUNTER — Encounter: Payer: Self-pay | Admitting: Podiatry

## 2014-12-04 VITALS — BP 141/68 | HR 59 | Ht 65.0 in | Wt 195.0 lb

## 2014-12-04 DIAGNOSIS — M79675 Pain in left toe(s): Secondary | ICD-10-CM | POA: Diagnosis not present

## 2014-12-04 DIAGNOSIS — L600 Ingrowing nail: Secondary | ICD-10-CM

## 2014-12-04 NOTE — Progress Notes (Signed)
SUBJECTIVE: 49 y.o. year old female presents complaining of painful toe nail left great toe from ingrown nail. She is getting ingrown nails regularly and wants to be fixed.  OBJECTIVE: DERMATOLOGIC EXAMINATION: Nails: Ingrown nail on both borders of both great toe left>right.  VASCULAR EXAMINATION OF LOWER LIMBS: Pedal pulses: All pedal pulses are palpable with normal pulsation.  NEUROLOGIC EXAMINATION OF THE LOWER LIMBS: All epicritic and tactile sensations grossly intact.  MUSCULOSKELETAL EXAMINATION: No gross deformities noted.  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  PLAN: Phenol and Alcohol Matrixectomy of left great toe both borders were done as follow;  Left great toe was anesthetized with total 5ml mixture of 50/50 0.5% Marcaine plain and 1% Xylocaine plain. Affected both nail borders were reflected with a nail elevator and excised with nail nipper. Proximal nail matrix tissue was cauterized with Phenol soaked cotton applicator x 4 and neutralized with Alcohol soaked cotton applicator. The wound was dressed with Amerigel ointment dressing. Home care instructions and supply dispensed.  Return in 1 week for follow up.

## 2014-12-04 NOTE — Patient Instructions (Signed)
Ingrown nail surgery was done. Follow soaking instruction.  Some redness and drainage is expected. Call the office if the area gets feverish with increased redness and drainage. 

## 2014-12-05 LAB — CYTOLOGY - PAP

## 2014-12-06 ENCOUNTER — Encounter: Payer: Managed Care, Other (non HMO) | Admitting: Family Medicine

## 2014-12-06 ENCOUNTER — Encounter: Payer: Self-pay | Admitting: Family Medicine

## 2014-12-06 ENCOUNTER — Ambulatory Visit (INDEPENDENT_AMBULATORY_CARE_PROVIDER_SITE_OTHER): Payer: Managed Care, Other (non HMO) | Admitting: Family Medicine

## 2014-12-06 VITALS — BP 132/88 | HR 55 | Temp 98.5°F | Ht 65.0 in | Wt 192.4 lb

## 2014-12-06 DIAGNOSIS — E162 Hypoglycemia, unspecified: Secondary | ICD-10-CM | POA: Diagnosis not present

## 2014-12-06 DIAGNOSIS — E538 Deficiency of other specified B group vitamins: Secondary | ICD-10-CM

## 2014-12-06 LAB — GLUCOSE, POCT (MANUAL RESULT ENTRY): POC Glucose: 96 mg/dl (ref 70–99)

## 2014-12-06 MED ORDER — CYANOCOBALAMIN 1000 MCG/ML IJ SOLN
1000.0000 ug | Freq: Once | INTRAMUSCULAR | Status: DC
Start: 2014-12-06 — End: 2017-09-09

## 2014-12-06 MED ORDER — CYANOCOBALAMIN 1000 MCG/ML IJ SOLN
1000.0000 ug | Freq: Once | INTRAMUSCULAR | Status: AC
  Administered 2014-12-06: 1000 ug via INTRAMUSCULAR

## 2014-12-06 NOTE — Progress Notes (Signed)
Patient ID: [name] BaconHeidi Ramos, female    DOB: Feb 14, 1966  Age: 49 y.o. MRN: [id number]    Subjective:  Subjective HPI [name] BaconHeidi Ramos presents for f/u b12 and low pulse and sugar.  Pt is feeling a little better   Review of Systems  Constitutional: Negative for diaphoresis, appetite change, fatigue and unexpected weight change.  Eyes: Negative for pain, redness and visual disturbance.  Respiratory: Negative for cough, chest tightness, shortness of breath and wheezing.   Cardiovascular: Negative for chest pain, palpitations and leg swelling.  Endocrine: Negative for cold intolerance, heat intolerance, polydipsia, polyphagia and polyuria.  Genitourinary: Negative for dysuria, frequency and difficulty urinating.  Neurological: Negative for dizziness, light-headedness, numbness and headaches.    History Past Medical History  Diagnosis Date  . Alcohol abuse     recovering alcoholic  . Depression   . Anxiety   . Dyslipidemia     Followed by PCP  . PVC's (premature ventricular contractions)     Controlled on beta blocker therapy;  Echo 9/10: EF 55-60%, grade 1 diastolic dysfunction, mild MR, mild LAE  . Hiatal hernia     with reflux  . Cholecystitis, acute   . Obesity   . Urinary tract infection   . Tinea corporis   . GERD (gastroesophageal reflux disease)   . Thyroid nodule     hx  . Hematuria   . Congenital anomaly of neck   . Unspecified contraceptive management   . Allergic urticaria   . Kidney stone   . Hypertension     stress test 11/2010 normal  . Gastroparesis     She has past surgical history that includes Tonsillectomy and leep surgery.   Her family history includes Breast cancer in her mother; Colon polyps in her mother; Coronary artery disease in her other; Diabetes in her maternal grandmother and mother; Heart attack (age of onset: 7448) in her maternal grandmother; Hypertension in her other; Irritable bowel syndrome in her mother; Liver disease in her mother; Lung  cancer in her maternal grandmother, maternal uncle, and paternal grandmother; Thyroid disease in her mother.She reports that she has been smoking Cigarettes.  She has a 12 pack-year smoking history. She has never used smokeless tobacco. She reports that she drinks alcohol. She reports that she does not use illicit drugs.  Current Outpatient Prescriptions on File Prior to Visit  Medication Sig Dispense Refill  . ALPRAZolam (XANAX) 0.25 MG tablet Take 1 tablet (0.25 mg total) by mouth 3 (three) times daily as needed for anxiety. 30 tablet 0  . atenolol (TENORMIN) 50 MG tablet Take 1 tablet (50 mg total) by mouth daily. 90 tablet 3  . medroxyPROGESTERone (DEPO-PROVERA) 150 MG/ML injection AS DIRECTED EVERY 3 MONTHS 1 mL 11  . omeprazole (PRILOSEC) 40 MG capsule Take 1 capsule (40 mg total) by mouth daily. 90 capsule 3  . [DISCONTINUED] oxybutynin (DITROPAN-XL) 10 MG 24 hr tablet Take 10 mg by mouth daily.     No current facility-administered medications on file prior to visit.     Objective:  Objective Physical Exam  Constitutional: She is oriented to person, place, and time. She appears well-developed and well-nourished.  HENT:  Head: Normocephalic and atraumatic.  Eyes: Conjunctivae and EOM are normal.  Neck: Normal range of motion. Neck supple. No JVD present. Carotid bruit is not present. No thyromegaly present.  Cardiovascular: Normal rate, regular rhythm and normal heart sounds.   No murmur heard. Pulmonary/Chest: Effort normal and breath sounds normal. No respiratory distress. She  has no wheezes. She has no rales. She exhibits no tenderness.  Musculoskeletal: She exhibits no edema.  Neurological: She is alert and oriented to person, place, and time.  Psychiatric: She has a normal mood and affect.   BP 132/88 mmHg  Pulse 55  Temp(Src) 98.5 F (36.9 C) (Oral)  Ht  (1.651 m)  Wt 192 lb 6.4 oz (87.272 kg)  BMI 32.02 kg/m2  SpO2 98% Wt Readings from Last 3 Encounters:    12/06/14 192 lb 6.4 oz (87.272 kg)  12/04/14 195 lb (88.451 kg)  11/29/14 195 lb 3.2 oz (88.542 kg)     Lab Results  Component Value Date   WBC 10.3 11/29/2014   HGB 14.5 11/29/2014   HCT 45.4 11/29/2014   PLT 236.0 11/29/2014   GLUCOSE * 11/29/2014    <10 Repeated and verified X2. Questionable results, please check on patient, and recollect sample.   CHOL 215* 11/29/2014   TRIG 82.0 11/29/2014   HDL 47.70 11/29/2014   LDLDIRECT 148.1 01/20/2008   LDLCALC 151* 11/29/2014   ALT 17 11/29/2014   AST 16 11/29/2014   NA 143 11/29/2014   K 4.1 11/29/2014   CL 101 11/29/2014   CREATININE 1.14 11/29/2014   BUN 17 11/29/2014   CO2 22 11/29/2014   TSH 1.84 11/29/2014    No results found.   Assessment & Plan:  Plan I am having Ms. Bromwell start on cyanocobalamin. I am also having her maintain her medroxyPROGESTERone, ALPRAZolam, omeprazole, and atenolol.  Meds ordered this encounter  Medications  . cyanocobalamin (,VITAMIN B-12,) 1000 MCG/ML injection    Sig: Inject 1 mL (1,000 mcg total) into the muscle once.    Dispense:  4 mL    Refill:  0    Problem List Items Addressed This Visit    None    Visit Diagnoses    B12 deficiency    -  Primary    Relevant Medications    cyanocobalamin (,VITAMIN B-12,) 1000 MCG/ML injection    Other Relevant Orders    Vitamin B12    Low blood sugar        Relevant Orders    POCT Glucose (CBG) (Completed)       Follow-up: Return in about 4 weeks (around 01/03/2015), or if symptoms worsen or fail to improve, for lab.  Loreen Freud, DO

## 2014-12-06 NOTE — Addendum Note (Signed)
Addended by: Harold BarbanBYRD, Lorin Gawron E on: 12/06/2014 11:08 AM   Modules accepted: Orders

## 2014-12-06 NOTE — Patient Instructions (Signed)

## 2014-12-11 ENCOUNTER — Encounter: Payer: Self-pay | Admitting: Podiatry

## 2014-12-11 ENCOUNTER — Ambulatory Visit (INDEPENDENT_AMBULATORY_CARE_PROVIDER_SITE_OTHER): Payer: Managed Care, Other (non HMO) | Admitting: Podiatry

## 2014-12-11 DIAGNOSIS — L600 Ingrowing nail: Secondary | ICD-10-CM

## 2014-12-11 NOTE — Progress Notes (Signed)
Post op wound following P&A left great toe. Stated that she had an infection and took Augmentin 500 mg three times a day for 3 days.  Wound is clean with minimum drainage.  Mildly erythematous on nail borders. Infection is clearing up.  Continue soaking for another week.

## 2014-12-11 NOTE — Patient Instructions (Signed)
Post op nail doing ok now following infection. Return as needed. Continue soak another week.

## 2014-12-18 ENCOUNTER — Ambulatory Visit: Payer: Managed Care, Other (non HMO) | Admitting: Family Medicine

## 2014-12-18 ENCOUNTER — Telehealth: Payer: Self-pay | Admitting: *Deleted

## 2014-12-18 ENCOUNTER — Telehealth: Payer: Self-pay | Admitting: Family Medicine

## 2014-12-18 NOTE — Telephone Encounter (Signed)
Detailed message left advising the vitamin D levels was 193 on 11/29/14 and to call the office with any further questions or concerns.       KP

## 2014-12-18 NOTE — Telephone Encounter (Signed)
Relation to pt: self  Call back number: [phone]   Reason for call:  Patient would like to know her B12 levels

## 2014-12-18 NOTE — Telephone Encounter (Signed)
Pt stated toenail is improving but toe still hurts

## 2015-01-14 ENCOUNTER — Other Ambulatory Visit (INDEPENDENT_AMBULATORY_CARE_PROVIDER_SITE_OTHER): Payer: Managed Care, Other (non HMO)

## 2015-01-14 ENCOUNTER — Other Ambulatory Visit: Payer: Managed Care, Other (non HMO)

## 2015-01-14 DIAGNOSIS — E538 Deficiency of other specified B group vitamins: Secondary | ICD-10-CM | POA: Diagnosis not present

## 2015-01-14 LAB — VITAMIN B12: VITAMIN B 12: 666 pg/mL (ref 211–911)

## 2015-01-21 ENCOUNTER — Telehealth: Payer: Self-pay | Admitting: Family Medicine

## 2015-01-21 NOTE — Telephone Encounter (Signed)
°  Relation to ZO:XWRU Call back number:[phone] Pharmacy:  Reason for call: pt states she received her lab results by mail and states that her b12 was high she wants to know if she needs to be tested again or does the b12 shots help control that to bring the numbers down.

## 2015-01-21 NOTE — Telephone Encounter (Signed)
Msg left to call the office     KP 

## 2015-01-22 NOTE — Telephone Encounter (Signed)
Patient returning your call best #[phone]

## 2015-01-23 NOTE — Telephone Encounter (Signed)
Left message for pt to return my call.

## 2015-01-24 NOTE — Telephone Encounter (Signed)
Spoke with patient and she stated she purchased the ODT B-12 and wanted to know if she can do that or continue the injections I made her aware she can do either and we can recheck her B-12 if she feels that is is low, she verbalized understanding and will take the B-12 daily.     KP

## 2015-01-24 NOTE — Telephone Encounter (Signed)
Message left to call the office.    KP 

## 2015-05-16 ENCOUNTER — Telehealth: Payer: Self-pay | Admitting: Family Medicine

## 2015-05-16 NOTE — Telephone Encounter (Signed)
Left message for patient to call about flu shot °

## 2015-07-24 ENCOUNTER — Telehealth: Payer: Self-pay | Admitting: Family Medicine

## 2015-07-24 NOTE — Telephone Encounter (Signed)
Noted. Vaccine marked as declined.

## 2015-07-24 NOTE — Telephone Encounter (Signed)
Pt declined flu shot - stated she has never had one in her life

## 2015-08-29 ENCOUNTER — Telehealth: Payer: Self-pay | Admitting: Family Medicine

## 2015-08-29 DIAGNOSIS — Z3009 Encounter for other general counseling and advice on contraception: Secondary | ICD-10-CM

## 2015-08-29 MED ORDER — MEDROXYPROGESTERONE ACETATE 150 MG/ML IM SUSP
INTRAMUSCULAR | Status: DC
Start: 2015-08-29 — End: 2017-02-04

## 2015-08-29 NOTE — Telephone Encounter (Signed)
TELEPHONE ADVICE RECORD [facility]  Patient Name: [name] Ramos  DOB: 07-09-1965    Initial Comment caller states she has a vibrating sensation in groin area when her heart skips a beat   Nurse Assessment  Nurse: Odis LusterBowers, RN, Bjorn Loserhonda Date/Time (Eastern Time): 08/29/2015 10:35:28 AM  Confirm and document reason for call. If symptomatic, describe symptoms. You must click the next button to save text entered. ---caller states she has a vibrating sensation in groin area when her heart skips a beat. Reports that is began 2 days ago and happens several times thru out the day. Reports that it was happening several times this morning, took some ASA and it has slowed down. She reports that she has PVC's and has been dx by MD. She takes Atenolol 100mg  to lower her heart-rate. Doesn't feel like her heart rate is high at this time. Reports that the "vibrating sensation" is getting worse. Reports that she drinks 2 cups of coffee in the morning.  Has the patient traveled out of the country within the last 30 days? ---No  Does the patient have any new or worsening symptoms? ---Yes  Will a triage be completed? ---Yes  Related visit to physician within the last 2 weeks? ---No  Does the PT have any chronic conditions? (i.e. diabetes, asthma, etc.) ---Yes  List chronic conditions. ---PVC's, htn  Is the patient pregnant or possibly pregnant? (Ask all females between the ages of 5312-55) ---No  Is this a behavioral health or substance abuse call? ---No     Guidelines    Guideline Title Affirmed Question Affirmed Notes  Heart Rate and Heartbeat Questions Palpitations (all triage questions negative)    Final Disposition User   [facility] [place], RN, Bjorn Loserhonda

## 2015-08-29 NOTE — Telephone Encounter (Signed)
Pt should have ov if symptoms con't

## 2015-08-29 NOTE — Telephone Encounter (Signed)
Called to follow up with patient.  No answer.  Left a detailed message on identifiable voicemail making patient aware if symptoms persist to call back for an OV.

## 2015-08-29 NOTE — Telephone Encounter (Signed)
Self  Pt says that she need a refill on her DEPO-PROVERA Rx. Pt also has a concern about her heart.   Pt is requesting a call back.   CB: [phone]

## 2015-08-29 NOTE — Telephone Encounter (Signed)
Patient has been having a ringing sensation in her heart per [name] Ramos, I advised her to transfer the call to [facility] for Triage.      KP

## 2015-08-29 NOTE — Telephone Encounter (Signed)
Message left to call the office.    KP 

## 2015-08-29 NOTE — Telephone Encounter (Signed)
Noted. Message routed to PCP for FYI.  

## 2015-08-30 ENCOUNTER — Encounter (HOSPITAL_BASED_OUTPATIENT_CLINIC_OR_DEPARTMENT_OTHER): Payer: Self-pay | Admitting: *Deleted

## 2015-08-30 ENCOUNTER — Emergency Department (HOSPITAL_BASED_OUTPATIENT_CLINIC_OR_DEPARTMENT_OTHER)
Admission: EM | Admit: 2015-08-30 | Discharge: 2015-08-30 | Disposition: A | Discharge status: Home / Self Care | Payer: Commercial Managed Care - HMO | Attending: Emergency Medicine | Admitting: Emergency Medicine

## 2015-08-30 ENCOUNTER — Emergency Department (HOSPITAL_BASED_OUTPATIENT_CLINIC_OR_DEPARTMENT_OTHER): Payer: Commercial Managed Care - HMO

## 2015-08-30 ENCOUNTER — Ambulatory Visit (INDEPENDENT_AMBULATORY_CARE_PROVIDER_SITE_OTHER): Payer: Commercial Managed Care - HMO | Admitting: Family Medicine

## 2015-08-30 ENCOUNTER — Other Ambulatory Visit: Payer: Self-pay | Admitting: Family Medicine

## 2015-08-30 ENCOUNTER — Other Ambulatory Visit: Payer: Self-pay

## 2015-08-30 ENCOUNTER — Encounter: Payer: Self-pay | Admitting: Family Medicine

## 2015-08-30 VITALS — BP 142/92 | HR 57 | Temp 98.7°F | Ht 65.0 in | Wt 193.5 lb

## 2015-08-30 DIAGNOSIS — R002 Palpitations: Secondary | ICD-10-CM | POA: Insufficient documentation

## 2015-08-30 DIAGNOSIS — Z87440 Personal history of urinary (tract) infections: Secondary | ICD-10-CM | POA: Diagnosis not present

## 2015-08-30 DIAGNOSIS — Z87442 Personal history of urinary calculi: Secondary | ICD-10-CM | POA: Insufficient documentation

## 2015-08-30 DIAGNOSIS — I10 Essential (primary) hypertension: Secondary | ICD-10-CM | POA: Diagnosis not present

## 2015-08-30 DIAGNOSIS — R079 Chest pain, unspecified: Secondary | ICD-10-CM | POA: Insufficient documentation

## 2015-08-30 DIAGNOSIS — Q189 Congenital malformation of face and neck, unspecified: Secondary | ICD-10-CM | POA: Insufficient documentation

## 2015-08-30 DIAGNOSIS — F17210 Nicotine dependence, cigarettes, uncomplicated: Secondary | ICD-10-CM | POA: Insufficient documentation

## 2015-08-30 DIAGNOSIS — F419 Anxiety disorder, unspecified: Secondary | ICD-10-CM | POA: Insufficient documentation

## 2015-08-30 DIAGNOSIS — Z872 Personal history of diseases of the skin and subcutaneous tissue: Secondary | ICD-10-CM | POA: Insufficient documentation

## 2015-08-30 DIAGNOSIS — Z8619 Personal history of other infectious and parasitic diseases: Secondary | ICD-10-CM | POA: Diagnosis not present

## 2015-08-30 DIAGNOSIS — F329 Major depressive disorder, single episode, unspecified: Secondary | ICD-10-CM | POA: Diagnosis not present

## 2015-08-30 DIAGNOSIS — E669 Obesity, unspecified: Secondary | ICD-10-CM | POA: Diagnosis not present

## 2015-08-30 DIAGNOSIS — Z79899 Other long term (current) drug therapy: Secondary | ICD-10-CM | POA: Insufficient documentation

## 2015-08-30 DIAGNOSIS — K219 Gastro-esophageal reflux disease without esophagitis: Secondary | ICD-10-CM | POA: Diagnosis not present

## 2015-08-30 LAB — TROPONIN I: Troponin I: <0.03 ng/mL (ref <0.031)

## 2015-08-30 LAB — BASIC METABOLIC PANEL
ANION GAP: 8 (ref 5–15)
BUN: 10 mg/dL (ref 6–20)
CHLORIDE: 109 mmol/L (ref 101–111)
CO2: 22 mmol/L (ref 22–32)
Calcium: 9.5 mg/dL (ref 8.9–10.3)
Creatinine, Ser: 0.89 mg/dL (ref 0.44–1.00)
GFR calc non Af Amer: >60 mL/min (ref >60)
Glucose, Bld: 93 mg/dL (ref 65–99)
POTASSIUM: 3.6 mmol/L (ref 3.5–5.1)
SODIUM: 139 mmol/L (ref 135–145)

## 2015-08-30 LAB — CBC
HEMATOCRIT: 43.8 % (ref 36.0–46.0)
HEMOGLOBIN: 15.2 g/dL — AB (ref 12.0–15.0)
MCH: 31.9 pg (ref 26.0–34.0)
MCHC: 34.7 g/dL (ref 30.0–36.0)
MCV: 92 fL (ref 78.0–100.0)
Platelets: 221 10*3/uL (ref 150–400)
RBC: 4.76 MIL/uL (ref 3.87–5.11)
RDW: 13 % (ref 11.5–15.5)
WBC: 9 10*3/uL (ref 4.0–10.5)

## 2015-08-30 MED ORDER — ASPIRIN 81 MG PO CHEW
324.0000 mg | CHEWABLE_TABLET | Freq: Once | ORAL | Status: AC
  Administered 2015-08-30: 324 mg via ORAL
  Filled 2015-08-30: qty 4

## 2015-08-30 NOTE — Progress Notes (Signed)
Pre visit review using our clinic review tool, if applicable. No additional management support is needed unless otherwise documented below in the visit note. 

## 2015-08-30 NOTE — Discharge Instructions (Signed)
Aspirin and Your Heart  Aspirin is a medicine that affects the way blood clots. Aspirin can be used to help reduce the risk of blood clots, heart attacks, and other heart-related problems.  SHOULD I TAKE ASPIRIN? Your health care provider will help you determine whether it is safe and beneficial for you to take aspirin daily. Taking aspirin daily may be beneficial if you:  Have had a heart attack or chest pain.  Have undergone open heart surgery such as coronary artery bypass surgery (CABG).  Have had coronary angioplasty.  Have experienced a stroke or transient ischemic attack (TIA).  Have peripheral vascular disease (PVD).  Have chronic heart rhythm problems such as atrial fibrillation. ARE THERE ANY RISKS OF TAKING ASPIRIN DAILY? Daily use of aspirin can increase your risk of side effects. Some of these include:  Bleeding. Bleeding problems can be minor or serious. An example of a minor problem is a cut that does not stop bleeding. An example of a more serious problem is stomach bleeding or bleeding into the brain. Your risk of bleeding is increased if you are also taking non-steroidal anti-inflammatory medicine (NSAIDs).  Increased bruising.  Upset stomach.  An allergic reaction. People who have nasal polyps have an increased risk of developing an aspirin allergy. WHAT ARE SOME GUIDELINES I SHOULD FOLLOW WHEN TAKING ASPIRIN?   Take aspirin only as directed by your health care provider. Make sure you understand how much you should take and what form you should take. The two forms of aspirin are:  Non-enteric-coated. This type of aspirin does not have a coating and is absorbed quickly. Non-enteric-coated aspirin is usually recommended for people with chest pain. This type of aspirin also comes in a chewable form.  Enteric-coated. This type of aspirin has a special coating that releases the medicine very slowly. Enteric-coated aspirin causes less stomach upset than non-enteric-coated  aspirin. This type of aspirin should not be chewed or crushed.  Drink alcohol in moderation. Drinking alcohol increases your risk of bleeding. WHEN SHOULD I SEEK MEDICAL CARE?   You have unusual bleeding or bruising.  You have stomach pain.  You have an allergic reaction. Symptoms of an allergic reaction include:  Hives.  Itchy skin.  Swelling of the lips, tongue, or face.  You have ringing in your ears. WHEN SHOULD I SEEK IMMEDIATE MEDICAL CARE?   Your bowel movements are bloody, dark red, or black in color.  You vomit or cough up blood.  You have blood in your urine.  You cough, wheeze, or feel short of breath. If you have any of the following symptoms, this is an emergency. Do not wait to see if the pain will go away. Get medical help at once. Call your local emergency services (911 in the [place] Do not drive yourself to the hospital.  You have severe chest pain, especially if the pain is crushing or pressure-like and spreads to the arms, back, neck, or jaw.  You have stroke-like symptoms, such as:   Loss of vision.   Difficulty talking.   Numbness or weakness on one side of your body.   Numbness or weakness in your arm or leg.   Not thinking clearly or feeling confused.    This information is not intended to replace advice given to you by your health care provider. Make sure you discuss any questions you have with your health care provider.   Document Released: 04/30/2008 Document Revised: 06/08/2014 Document Reviewed: 08/23/2013 Elsevier Interactive Patient Education 2016 Elsevier   Inc.  

## 2015-08-30 NOTE — ED Notes (Signed)
Pt made aware to return if symptoms worsen or if any life threatening symptoms occur.   

## 2015-08-30 NOTE — ED Provider Notes (Signed)
CSN: [id number]     Arrival date & time 08/30/15  1221 History   First MD Initiated Contact with Patient 08/30/15 1224     Chief Complaint  Patient presents with  . Chest Pain     (Consider location/radiation/quality/duration/timing/severity/associated sxs/prior Treatment) Patient is a 50 y.o. female presenting with chest pain. The history is provided by the patient.  Chest Pain Associated symptoms: palpitations   Associated symptoms: no abdominal pain, no back pain, no fever, no headache, no nausea, no shortness of breath and not vomiting   Patient referred to the emergency department from primary care office upstairs. Patient with left anterior chest discomfort pain currently 1 out of 10 at its worse it be 3 out of 10. In essentially present in some shaped fashion her form for the past 3 days. Patient's had pain like this many times in the past was felt it was related to her hiatal hernia. Is improved somewhat with belching. No shortness of breath no nausea or vomiting. Patient does take an aspirin a day did have one yesterday but none today. EKG from of primary care's office showed left bundle branch block. Which apparently is a new finding. Last EKG was June. Patient also with complaint of a vibration feeling in her left groin that makes her heart skip a beat.  Past Medical History  Diagnosis Date  . Alcohol abuse     recovering alcoholic  . Depression   . Anxiety   . Dyslipidemia     Followed by PCP  . PVC's (premature ventricular contractions)     Controlled on beta blocker therapy;  Echo 9/10: EF 55-60%, grade 1 diastolic dysfunction, mild MR, mild LAE  . Hiatal hernia     with reflux  . Cholecystitis, acute   . Obesity   . Urinary tract infection   . Tinea corporis   . GERD (gastroesophageal reflux disease)   . Thyroid nodule     hx  . Hematuria   . Congenital anomaly of neck   . Unspecified contraceptive management   . Allergic urticaria   . Kidney stone   .  Hypertension     stress test 11/2010 normal  . Gastroparesis    Past Surgical History  Procedure Laterality Date  . Tonsillectomy    . Leep surgery      mid 20's   Family History  Problem Relation Age of Onset  . Diabetes Mother   . Liver disease Mother   . Irritable bowel syndrome Mother   . Colon polyps Mother   . Breast cancer Mother   . Thyroid disease Mother   . Lung cancer Maternal Uncle   . Lung cancer Maternal Grandmother   . Heart attack Maternal Grandmother 48  . Diabetes Maternal Grandmother   . Lung cancer Paternal Grandmother   . Coronary artery disease Other     female 1st degree relative 48 yo  . Hypertension Other    Social History  Substance Use Topics  . Smoking status: Current Every Day Smoker -- 0.50 packs/day for 24 years    Types: Cigarettes    Last Attempt to Quit: 07/02/2008  . Smokeless tobacco: Never Used  . Alcohol Use: 0.0 oz/week    0 Standard drinks or equivalent per week     Comment: drinks daily   OB History    No data available     Review of Systems  Constitutional: Negative for fever.  HENT: Negative for congestion.   Eyes: Negative for  visual disturbance.  Respiratory: Negative for shortness of breath.   Cardiovascular: Positive for chest pain and palpitations. Negative for leg swelling.  Gastrointestinal: Negative for nausea, vomiting, abdominal pain and diarrhea.  Genitourinary: Negative for dysuria.  Musculoskeletal: Negative for back pain.  Skin: Negative for rash.  Neurological: Negative for headaches.  Hematological: Does not bruise/bleed easily.  Psychiatric/Behavioral: Negative for confusion.      Allergies  Piroxicam  Home Medications   Prior to Admission medications   Medication Sig Start Date End Date Taking? Authorizing Provider  ALPRAZolam (XANAX) 0.25 MG tablet Take 1 tablet (0.25 mg total) by mouth 3 (three) times daily as needed for anxiety. 11/29/14   Lelon Perla Chase, DO  atenolol (TENORMIN) 50 MG  tablet Take 1 tablet (50 mg total) by mouth daily. 11/29/14   Lelon Perla Chase, DO  cyanocobalamin (,VITAMIN B-12,) 1000 MCG/ML injection Inject 1 mL (1,000 mcg total) into the muscle once. 12/06/14   Lelon Perla Chase, DO  medroxyPROGESTERone (DEPO-PROVERA) 150 MG/ML injection AS DIRECTED EVERY 3 MONTHS 08/29/15   Lelon Perla Chase, DO  omeprazole (PRILOSEC) 40 MG capsule Take 1 capsule (40 mg total) by mouth daily. 11/29/14   Yvonne R Lowne Chase, DO   BP 161/71 mmHg  Pulse 57  Temp(Src) 98.3 F (36.8 C) (Oral)  Resp 16  Ht  (1.651 m)  Wt 87.544 kg  BMI 32.12 kg/m2  SpO2 100% Physical Exam  Constitutional: She is oriented to person, place, and time. She appears well-developed and well-nourished. No distress.  HENT:  Head: Normocephalic and atraumatic.  Mouth/Throat: Oropharynx is clear and moist.  Eyes: Conjunctivae and EOM are normal. Pupils are equal, round, and reactive to light.  Neck: Normal range of motion. Neck supple.  Cardiovascular: Normal rate, regular rhythm and normal heart sounds.   No murmur heard. Pulmonary/Chest: Effort normal and breath sounds normal. No respiratory distress. She has no wheezes. She exhibits no tenderness.  Abdominal: Soft. Bowel sounds are normal. There is no tenderness.  Musculoskeletal: Normal range of motion. She exhibits no edema.  Neurological: She is alert and oriented to person, place, and time. No cranial nerve deficit. She exhibits normal muscle tone. Coordination normal.  Skin: Skin is warm. No rash noted.  Nursing note and vitals reviewed.   ED Course  Procedures (including critical care time) Labs Review Labs Reviewed  CBC - Abnormal; Notable for the following:    Hemoglobin 15.2 (*)    All other components within normal limits  BASIC METABOLIC PANEL  TROPONIN I   Results for orders placed or performed during the hospital encounter of 08/30/15  Basic metabolic panel  Result Value Ref Range   Sodium 139 135 - 145  mmol/L   Potassium 3.6 3.5 - 5.1 mmol/L   Chloride 109 101 - 111 mmol/L   CO2 22 22 - 32 mmol/L   Glucose, Bld 93 65 - 99 mg/dL   BUN 10 6 - 20 mg/dL   Creatinine, Ser 1.61 0.44 - 1.00 mg/dL   Calcium 9.5 8.9 - 09.6 mg/dL   GFR calc non Af Amer >60 >60 mL/min   GFR calc Af Amer >60 >60 mL/min   Anion gap 8 5 - 15  CBC  Result Value Ref Range   WBC 9.0 4.0 - 10.5 K/uL   RBC 4.76 3.87 - 5.11 MIL/uL   Hemoglobin 15.2 (H) 12.0 - 15.0 g/dL   HCT 04.5 40.9 - 81.1 %   MCV 92.0  78.0 - 100.0 fL   MCH 31.9 26.0 - 34.0 pg   MCHC 34.7 30.0 - 36.0 g/dL   RDW 40.9 81.1 - 91.4 %   Platelets 221 150 - 400 K/uL  Troponin I  Result Value Ref Range   Troponin I <0.03 <0.031 ng/mL     Imaging Review Dg Chest 2 View  08/30/2015  CLINICAL DATA:  Chest pain. EXAM: CHEST  2 VIEW COMPARISON:  08/28/2014 FINDINGS: Biapical scarring. Lungs otherwise clear. Heart is normal size. No effusions. No acute bony abnormality. IMPRESSION: No active cardiopulmonary disease. Electronically Signed   By: Charlett Nose M.D.   On: 08/30/2015 12:45   I have personally reviewed and evaluated these images and lab results as part of my medical decision-making.   EKG Interpretation   Date/Time:  Friday August 30 2015 12:25:00 EDT Ventricular Rate:  57 PR Interval:  172 QRS Duration: 132 QT Interval:  486 QTC Calculation: 473 R Axis:   -25 Text Interpretation:  Sinus bradycardia with sinus arrhythmia Left bundle  branch block Abnormal ECG No previous ECGs available Confirmed by  Oden Lindaman  MD, Marshea Wisher ([id number]) on 08/30/2015 12:31:52 PM      MDM   Final diagnoses:  Chest pain, unspecified chest pain type    Patient with at least 3 days of fairly consistent left anterior chest pain. Patient sometimes gets confused because her hiatal hernia will cause similar pain. This pain has been relieved some with belching. Patient already on Prilosec. Here troponin is negative and based on the duration of the symptoms fit was a  significant cardiac problem would expect it to be positive. Patient does have a new left bundle branch block last EKG according to patient was in June. Patient will referred to cardiology for additional outpatient follow-up of this. Patient or return for any new or worse symptoms. Patient's chest x-rays negative. As mentioned troponin was negative EKG of the left bundle-branch block had no significant findings. Patient will be started on baby aspirin a day.  Cardiac monitoring here without any arrhythmias.  Vanetta Mulders, MD 08/30/15 1407

## 2015-08-30 NOTE — ED Notes (Signed)
Pt reports 3-4 days of pinpoint CP on her left upper chest.  Reports that her left arm has a dull ache.  Denies N/V/SOB.  Pt also has complaints of a 'vibration' in her groin that causes her heart to skip a beat

## 2015-08-30 NOTE — Progress Notes (Signed)
Subjective:    Patient ID: [name] BaconHeidi Krzywicki, female    DOB: Feb 02, 1966, 50 y.o.   MRN: [id number]  Chief Complaint  Patient presents with  . Chest Pain  . Groin Pain  . Palpitations    Chest Pain  Pertinent negatives include no abdominal pain, dizziness, fever, headaches, malaise/fatigue, nausea, palpitations or shortness of breath.  Groin Pain Pertinent negatives include no abdominal pain, dysuria, fever, frequency, headaches, nausea or rash.  Palpitations  Associated symptoms include chest pain. Pertinent negatives include no anxiety, dizziness, fever, malaise/fatigue, nausea or shortness of breath.   Patient is in today for vibration in groin that have been causing patient to have palpations and reports concerns with heart skipping a beat, has been going on x 4 days.  Patient has a dull ache radiating down left arm.    Past Medical History  Diagnosis Date  . Alcohol abuse     recovering alcoholic  . Depression   . Anxiety   . Dyslipidemia     Followed by PCP  . PVC's (premature ventricular contractions)     Controlled on beta blocker therapy;  Echo 9/10: EF 55-60%, grade 1 diastolic dysfunction, mild MR, mild LAE  . Hiatal hernia     with reflux  . Cholecystitis, acute   . Obesity   . Urinary tract infection   . Tinea corporis   . GERD (gastroesophageal reflux disease)   . Thyroid nodule     hx  . Hematuria   . Congenital anomaly of neck   . Unspecified contraceptive management   . Allergic urticaria   . Kidney stone   . Hypertension     stress test 11/2010 normal  . Gastroparesis     Past Surgical History  Procedure Laterality Date  . Tonsillectomy    . Leep surgery      mid 20's    Family History  Problem Relation Age of Onset  . Diabetes Mother   . Liver disease Mother   . Irritable bowel syndrome Mother   . Colon polyps Mother   . Breast cancer Mother   . Thyroid disease Mother   . Lung cancer Maternal Uncle   . Lung cancer Maternal Grandmother    . Heart attack Maternal Grandmother 48  . Diabetes Maternal Grandmother   . Lung cancer Paternal Grandmother   . Coronary artery disease Other     female 1st degree relative 48 yo  . Hypertension Other     Social History   Social History  . Marital Status: Single    Spouse Name: N/A  . Number of Children: N/A  . 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 Smoking status: Current Every Day Smoker -- 0.50 packs/day for 24 years    Types: Cigarettes    Last Attempt to Quit: 07/02/2008  . Smokeless tobacco: Never Used  . Alcohol Use: 0.0 oz/week    0 Standard drinks or equivalent per week     Comment: drinks daily  . Drug Use: No  . Sexual Activity:    Partners: Male   Other Topics Concern  . Not on file   Social History Narrative   Exercising---walking dog daily    Outpatient Prescriptions Prior to Visit  Medication Sig Dispense Refill  . ALPRAZolam (XANAX) 0.25 MG tablet Take 1 tablet (0.25 mg total) by mouth 3 (three) times daily as needed for anxiety. 30 tablet  0  . atenolol (TENORMIN) 50 MG tablet Take 1 tablet (50 mg total) by mouth daily. 90 tablet 3  . cyanocobalamin (,VITAMIN B-12,) 1000 MCG/ML injection Inject 1 mL (1,000 mcg total) into the muscle once. 4 mL 0  . medroxyPROGESTERone (DEPO-PROVERA) 150 MG/ML injection AS DIRECTED EVERY 3 MONTHS 1 mL 4  . omeprazole (PRILOSEC) 40 MG capsule Take 1 capsule (40 mg total) by mouth daily. 90 capsule 3   No facility-administered medications prior to visit.    Allergies  Allergen Reactions  . Piroxicam     REACTION: hives    Review of Systems  Constitutional: Negative for fever and malaise/fatigue.  HENT: Negative for congestion.   Eyes: Negative for blurred vision.  Respiratory: Negative for shortness of breath.   Cardiovascular: Positive for chest pain. Negative for palpitations and leg swelling.  Gastrointestinal: Negative for nausea,  abdominal pain and blood in stool.  Genitourinary: Negative for dysuria and frequency.  Musculoskeletal: Negative for falls.  Skin: Negative for rash.  Neurological: Negative for dizziness, loss of consciousness and headaches.  Endo/Heme/Allergies: Negative for environmental allergies.  Psychiatric/Behavioral: Negative for depression. The patient is not nervous/anxious.        Objective:    Physical Exam  Constitutional: She is oriented to person, place, and time. She appears well-developed and well-nourished. No distress.  HENT:  Head: Normocephalic and atraumatic.  Eyes: Conjunctivae are normal.  Neck: Neck supple.  Cardiovascular: Normal rate, regular rhythm and normal heart sounds.   Pulmonary/Chest: Effort normal and breath sounds normal. No respiratory distress.  Abdominal: She exhibits no mass.  Musculoskeletal: She exhibits no edema.  Lymphadenopathy:    She has no cervical adenopathy.  Neurological: She is alert and oriented to person, place, and time.  Skin: Skin is warm and dry.  Psychiatric: She has a normal mood and affect. Her behavior is normal.  Nursing note and vitals reviewed.   BP 142/92 mmHg  Pulse 57  Temp(Src) 98.7 F (37.1 C) (Oral)  Ht  (1.651 m)  Wt 193 lb 8 oz (87.771 kg)  BMI 32.20 kg/m2  SpO2 98% Wt Readings from Last 3 Encounters:  08/30/15 193 lb (87.544 kg)  08/30/15 193 lb 8 oz (87.771 kg)  12/06/14 192 lb 6.4 oz (87.272 kg)     Lab Results  Component Value Date   WBC 9.0 08/30/2015   HGB 15.2* 08/30/2015   HCT 43.8 08/30/2015   PLT 221 08/30/2015   GLUCOSE 93 08/30/2015   CHOL 215* 11/29/2014   TRIG 82.0 11/29/2014   HDL 47.70 11/29/2014   LDLDIRECT 148.1 01/20/2008   LDLCALC 151* 11/29/2014   ALT 17 11/29/2014   AST 16 11/29/2014   NA 139 08/30/2015   K 3.6 08/30/2015   CL 109 08/30/2015   CREATININE 0.89 08/30/2015   BUN 10 08/30/2015   CO2 22 08/30/2015   TSH 1.84 11/29/2014    Lab Results  Component Value  Date   TSH 1.84 11/29/2014   Lab Results  Component Value Date   WBC 9.0 08/30/2015   HGB 15.2* 08/30/2015   HCT 43.8 08/30/2015   MCV 92.0 08/30/2015   PLT 221 08/30/2015   Lab Results  Component Value Date   NA 139 08/30/2015   K 3.6 08/30/2015   CO2 22 08/30/2015   GLUCOSE 93 08/30/2015   BUN 10 08/30/2015   CREATININE 0.89 08/30/2015   BILITOT 0.5 11/29/2014   ALKPHOS 57 11/29/2014   AST 16 11/29/2014   ALT  17 11/29/2014   PROT 7.4 11/29/2014   ALBUMIN 4.2 11/29/2014   CALCIUM 9.5 08/30/2015   ANIONGAP 8 08/30/2015   GFR 54.27* 11/29/2014   Lab Results  Component Value Date   CHOL 215* 11/29/2014   Lab Results  Component Value Date   HDL 47.70 11/29/2014   Lab Results  Component Value Date   LDLCALC 151* 11/29/2014   Lab Results  Component Value Date   TRIG 82.0 11/29/2014   Lab Results  Component Value Date   CHOLHDL 5 11/29/2014   No results found for: HGBA1C     Assessment & Plan:   Problem List Items Addressed This Visit    None    Visit Diagnoses    Palpitations    -  Primary    Relevant Orders    EKG 12-Lead (Completed)       I am having Ms. Shimon maintain her ALPRAZolam, omeprazole, atenolol, cyanocobalamin, and medroxyPROGESTERone.  No orders of the defined types were placed in this encounter.     Donato Schultz, DO

## 2015-08-31 DIAGNOSIS — R079 Chest pain, unspecified: Secondary | ICD-10-CM | POA: Insufficient documentation

## 2015-08-31 NOTE — Assessment & Plan Note (Signed)
With new LBBB on ekg Pt referred to ER for further evaluation

## 2015-09-02 ENCOUNTER — Encounter: Payer: Self-pay | Admitting: Interventional Cardiology

## 2015-09-02 ENCOUNTER — Ambulatory Visit (INDEPENDENT_AMBULATORY_CARE_PROVIDER_SITE_OTHER): Payer: Commercial Managed Care - HMO | Admitting: Interventional Cardiology

## 2015-09-02 VITALS — BP 126/72 | HR 57 | Ht 65.0 in | Wt 193.0 lb

## 2015-09-02 DIAGNOSIS — R079 Chest pain, unspecified: Secondary | ICD-10-CM

## 2015-09-02 DIAGNOSIS — I10 Essential (primary) hypertension: Secondary | ICD-10-CM

## 2015-09-02 DIAGNOSIS — I447 Left bundle-branch block, unspecified: Secondary | ICD-10-CM

## 2015-09-02 DIAGNOSIS — I493 Ventricular premature depolarization: Secondary | ICD-10-CM | POA: Diagnosis not present

## 2015-09-02 DIAGNOSIS — E785 Hyperlipidemia, unspecified: Secondary | ICD-10-CM

## 2015-09-02 NOTE — Patient Instructions (Signed)
Medication Instructions:  Your physician recommends that you continue on your current medications as directed. Please refer to the Current Medication list given to you today.   Labwork: None ordered  Testing/Procedures: Your physician has requested that you have a lexiscan myoview. For further information please visit [email or web address]. Please follow instruction sheet, as given.   Follow-Up: Your physician recommends that you schedule a follow-up appointment pending test results   Any Other Special Instructions Will Be Listed Below (If Applicable).     If you need a refill on your cardiac medications before your next appointment, please call your pharmacy.   

## 2015-09-02 NOTE — Progress Notes (Signed)
Cardiology Office Note   Date:  09/02/2015   ID:  [name] BaconHeidi Montijo, DOB 1966-01-08, MRN [id number]  PCP:  Donato SchultzYvonne R Lowne Chase, DO  Cardiologist:  Lesleigh NoeSMITH III,HENRY W, MD   Chief Complaint  Patient presents with  . Chest Pain      History of Present Illness: [name] Ramos is a 50 y.o. female who presents for Atypical chest pain recurrent over several years, palpitations identified to be due to PVCs, and recent emergency room visit with chest pain and new left bundle branch block.  Long-standing history of recurring atypical chest pain that is present only at rest. Never had exertional discomfort. Has been troubled by year is with palpitations. Palpitations are recognizable because of irregularity in rhythm and pulse. She also can feel a vibration in her femoral arteries.  The above background symptoms were associated with vague recurring left chest dullness. Episodes would last 2-3 minutes and resolved. With activity this symptom would completely resolved. Palpitations are normal on her present when she is physically active.    Past Medical History  Diagnosis Date  . Alcohol abuse     recovering alcoholic  . Depression   . Anxiety   . Dyslipidemia     Followed by PCP  . PVC's (premature ventricular contractions)     Controlled on beta blocker therapy;  Echo 9/10: EF 55-60%, grade 1 diastolic dysfunction, mild MR, mild LAE  . Hiatal hernia     with reflux  . Cholecystitis, acute   . Obesity   . Urinary tract infection   . Tinea corporis   . GERD (gastroesophageal reflux disease)   . Thyroid nodule     hx  . Hematuria   . Congenital anomaly of neck   . Unspecified contraceptive management   . Allergic urticaria   . Kidney stone   . Hypertension     stress test 11/2010 normal  . Gastroparesis     Past Surgical History  Procedure Laterality Date  . Tonsillectomy    . Leep surgery      mid 20's     Current Outpatient Prescriptions  Medication Sig Dispense  Refill  . ALPRAZolam (XANAX) 0.25 MG tablet Take 1 tablet (0.25 mg total) by mouth 3 (three) times daily as needed for anxiety. 30 tablet 0  . atenolol (TENORMIN) 100 MG tablet Take 100 mg by mouth daily.    . cyanocobalamin (,VITAMIN B-12,) 1000 MCG/ML injection Inject 1 mL (1,000 mcg total) into the muscle once. 4 mL 0  . medroxyPROGESTERone (DEPO-PROVERA) 150 MG/ML injection AS DIRECTED EVERY 3 MONTHS 1 mL 4  . omeprazole (PRILOSEC) 40 MG capsule Take 1 capsule (40 mg total) by mouth daily. 90 capsule 3  . [DISCONTINUED] oxybutynin (DITROPAN-XL) 10 MG 24 hr tablet Take 10 mg by mouth daily.     No current facility-administered medications for this visit.    Allergies:   Piroxicam    Social History:  The patient  reports that she has been smoking Cigarettes.  She has a 12 pack-year smoking history. She has never used smokeless tobacco. She reports that she drinks alcohol. She reports that she does not use illicit drugs.   Family History:  The patient's family history includes Breast cancer in her mother; Colon polyps in her mother; Coronary artery disease in her other; Diabetes in her maternal grandmother and mother; Heart attack (age of onset: 748) in her maternal grandmother; Hypertension in her other; Irritable bowel syndrome in her mother; Liver disease  in her mother; Lung cancer in her maternal grandmother, maternal uncle, and paternal grandmother; Thyroid disease in her mother.    ROS:  Please see the history of present illness.   Otherwise, review of systems are positive for anxiety.   All other systems are reviewed and negative.    PHYSICAL EXAM: VS:  BP 126/72 mmHg  Pulse 57  Ht  (1.651 m)  Wt 193 lb (87.544 kg)  BMI 32.12 kg/m2 , BMI Body mass index is 32.12 kg/(m^2). GEN: Well nourished, well developed, in no acute distress HEENT: normal Neck: no JVD, carotid bruits, or masses Cardiac: RRR.  There is no murmur, rub, or gallop. There is no edema. Respiratory:  clear to  auscultation bilaterally, normal work of breathing. GI: soft, nontender, nondistended, + BS MS: no deformity or atrophy Skin: warm and dry, no rash Neuro:  Strength and sensation are intact Psych: euthymic mood, full affect   EKG:  EKG is not ordered today. The ekg reveals NSR with LBBB.   Recent Labs: 11/29/2014: ALT 17; TSH 1.84 08/30/2015: BUN 10; Creatinine, Ser 0.89; Hemoglobin 15.2*; Platelets 221; Potassium 3.6; Sodium 139    Lipid Panel    Component Value Date/Time   CHOL 215* 11/29/2014 1608   TRIG 82.0 11/29/2014 1608   HDL 47.70 11/29/2014 1608   CHOLHDL 5 11/29/2014 1608   VLDL 16.4 11/29/2014 1608   LDLCALC 151* 11/29/2014 1608   LDLDIRECT 148.1 01/20/2008 0912      Wt Readings from Last 3 Encounters:  09/02/15 193 lb (87.544 kg)  08/30/15 193 lb (87.544 kg)  08/30/15 193 lb 8 oz (87.771 kg)      Other studies Reviewed: Additional studies/ records that were reviewed today include: Recent ER visit, stress echo performed last year, and all EKGs.. The findings include stress echo performed less than one year ago was unremarkable. LV function was normal..    ASSESSMENT AND PLAN:  1. Chest pain, unspecified chest pain type Atypical  2. PVC (premature ventricular contraction) Present for years  3. Left bundle branch block New and of uncertain etiology. Myocardial ischemia/CAD needs to be excluded in this smoker.  4. Essential hypertension Recent echo is of not demonstrated evidence of LVH  5. Hyperlipidemia On therapy    Current medicines are reviewed at length with the patient today.  The patient has the following concerns regarding medicines: .  The following changes/actions have been instituted:    Pharmacologic Myoview study  Provide copy of her 12-lead EKG for future reference  His stress test is negative, no specific follow-up will be required  Labs/ tests ordered today include:  No orders of the defined types were placed in this  encounter.     Disposition:   FU with HS in PRN    Signed, Lesleigh Noe, MD  09/02/2015 9:15 AM    [facility] [address] [place], [place], [place]  [address] Phone: [phone]; Fax: [phone]

## 2015-09-03 ENCOUNTER — Telehealth (HOSPITAL_COMMUNITY): Payer: Self-pay | Admitting: *Deleted

## 2015-09-03 NOTE — Telephone Encounter (Signed)
Left message on voicemail per DPR in reference to upcoming appointment scheduled on 09/05/15 at 0730 with detailed instructions given per Myocardial Perfusion Study Information Sheet for the test. LM to arrive 15 minutes early, and that it is imperative to arrive on time for appointment to keep from having the test rescheduled. If you need to cancel or reschedule your appointment, please call the office within 24 hours of your appointment. Failure to do so may result in a cancellation of your appointment, and a $50 no show fee. Phone number given for call back for any questions. Farris Blash, Adelene IdlerCynthia W

## 2015-09-05 ENCOUNTER — Ambulatory Visit (HOSPITAL_COMMUNITY): Payer: Commercial Managed Care - HMO | Attending: Cardiology

## 2015-09-05 DIAGNOSIS — R079 Chest pain, unspecified: Secondary | ICD-10-CM

## 2015-09-05 DIAGNOSIS — I447 Left bundle-branch block, unspecified: Secondary | ICD-10-CM | POA: Diagnosis not present

## 2015-09-05 DIAGNOSIS — I10 Essential (primary) hypertension: Secondary | ICD-10-CM | POA: Insufficient documentation

## 2015-09-05 DIAGNOSIS — R9439 Abnormal result of other cardiovascular function study: Secondary | ICD-10-CM | POA: Diagnosis not present

## 2015-09-05 LAB — MYOCARDIAL PERFUSION IMAGING
CHL CUP NUCLEAR SDS: 6
CHL CUP NUCLEAR SRS: 1
CHL CUP NUCLEAR SSS: 7
LHR: 0.28
LV sys vol: 62 mL
LVDIAVOL: 113 mL (ref 46–106)
Peak HR: 93 {beats}/min
Rest HR: 55 {beats}/min
TID: 0.98

## 2015-09-05 MED ORDER — TECHNETIUM TC 99M SESTAMIBI GENERIC - CARDIOLITE
10.4000 | Freq: Once | INTRAVENOUS | Status: AC | PRN
  Administered 2015-09-05: 10 via INTRAVENOUS

## 2015-09-05 MED ORDER — TECHNETIUM TC 99M SESTAMIBI GENERIC - CARDIOLITE
32.4000 | Freq: Once | INTRAVENOUS | Status: AC | PRN
  Administered 2015-09-05: 32 via INTRAVENOUS

## 2015-09-05 MED ORDER — REGADENOSON 0.4 MG/5ML IV SOLN
0.4000 mg | Freq: Once | INTRAVENOUS | Status: AC
Start: 2015-09-05 — End: 2015-09-05
  Administered 2015-09-05: 0.4 mg via INTRAVENOUS

## 2015-12-15 NOTE — Progress Notes (Signed)
Cardiology Office Note    Date:  12/16/2015   ID:  [name] Ramos, DOB 1966-04-05, MRN [id number]  PCP:  Donato Schultz, DO  Cardiologist: Lesleigh Noe, MD   Chief Complaint  Patient presents with  . Chest Pain    History of Present Illness:  [name] Ramos is a 50 y.o. female follow-up of premature ventricular contractions, left bundle branch block, family history of coronary artery disease, and atypical chest pain.  Earlier this she a stress Cardiolite revealed a fixed anterior wall defect felt secondary to breast attenuation or left bundle branch block. She denies chest pain. No significant other complaints. She has lost 35 pounds.    Past Medical History  Diagnosis Date  . Alcohol abuse     recovering alcoholic  . Depression   . Anxiety   . Dyslipidemia     Followed by PCP  . PVC's (premature ventricular contractions)     Controlled on beta blocker therapy;  Echo 9/10: EF 55-60%, grade 1 diastolic dysfunction, mild MR, mild LAE  . Hiatal hernia     with reflux  . Cholecystitis, acute   . Obesity   . Urinary tract infection   . Tinea corporis   . GERD (gastroesophageal reflux disease)   . Thyroid nodule     hx  . Hematuria   . Congenital anomaly of neck   . Unspecified contraceptive management   . Allergic urticaria   . Kidney stone   . Hypertension     stress test 11/2010 normal  . Gastroparesis     Past Surgical History  Procedure Laterality Date  . Tonsillectomy    . Leep surgery      mid 20's    Current Medications: Outpatient Prescriptions Prior to Visit  Medication Sig Dispense Refill  . ALPRAZolam (XANAX) 0.25 MG tablet Take 1 tablet (0.25 mg total) by mouth 3 (three) times daily as needed for anxiety. 30 tablet 0  . atenolol (TENORMIN) 100 MG tablet Take 100 mg by mouth daily.    . cyanocobalamin (,VITAMIN B-12,) 1000 MCG/ML injection Inject 1 mL (1,000 mcg total) into the muscle once. 4 mL 0  . medroxyPROGESTERone (DEPO-PROVERA)  150 MG/ML injection AS DIRECTED EVERY 3 MONTHS 1 mL 4  . omeprazole (PRILOSEC) 40 MG capsule Take 1 capsule (40 mg total) by mouth daily. 90 capsule 3   No facility-administered medications prior to visit.     Allergies:   Piroxicam   Social History   Social History  . Marital Status: Single    Spouse Name: N/A  . Number of Children: N/A  . 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 Smoking status: Current Every Day Smoker -- 0.50 packs/day for 24 years    Types: Cigarettes    Last Attempt to Quit: 07/02/2008  . Smokeless tobacco: Never Used  . Alcohol Use: 0.0 oz/week    0 Standard drinks or equivalent per week     Comment: drinks daily  . Drug Use: No  . Sexual Activity:    Partners: Male   Other Topics Concern  . None   Social History Narrative   Exercising---walking dog daily     Family History:  The patient's family history includes Breast cancer in her mother; Colon polyps in her mother; Coronary artery disease in her other; Diabetes in her maternal grandmother and mother; Heart attack (age of onset:  48) in her maternal grandmother; Hypertension in her other; Irritable bowel syndrome in her mother; Liver disease in her mother; Lung cancer in her maternal grandmother, maternal uncle, and paternal grandmother; Thyroid disease in her mother.   ROS:   Please see the history of present illness.    None  All other systems reviewed and are negative.   PHYSICAL EXAM:   VS:  BP 128/82 mmHg  Pulse 64  Ht 5\' 5"  (1.651 m)  Wt 179 lb (81.194 kg)  BMI 29.79 kg/m2   GEN: Well nourished, well developed, in no acute distress HEENT: normal Neck: no JVD, carotid bruits, or masses Cardiac: RRR; no murmurs, rubs, or gallops,no edema  Respiratory:  clear to auscultation bilaterally, normal work of breathing GI: soft, nontender, nondistended, + BS MS: no deformity or atrophy Skin: warm and dry, no rash Neuro:   Alert and Oriented x 3, Strength and sensation are intact Psych: euthymic mood, full affect  Wt Readings from Last 3 Encounters:  12/16/15 179 lb (81.194 kg)  09/02/15 193 lb (87.544 kg)  08/30/15 193 lb (87.544 kg)      Studies/Labs Reviewed:   EKG:  EKG  Not repeated. Left bundle branch block with QRS duration 130 ms  Recent Labs: 08/30/2015: BUN 10; Creatinine, Ser 0.89; Hemoglobin 15.2*; Platelets 221; Potassium 3.6; Sodium 139   Lipid Panel    Component Value Date/Time   CHOL 215* 11/29/2014 1608   TRIG 82.0 11/29/2014 1608   HDL 47.70 11/29/2014 1608   CHOLHDL 5 11/29/2014 1608   VLDL 16.4 11/29/2014 1608   LDLCALC 151* 11/29/2014 1608   LDLDIRECT 148.1 01/20/2008 0912    Additional studies/ records that were reviewed today include:  09/05/15, myocardial perfusion imaging: Overall Study Impression LBBB is intermittent during study Small region of mid septal ischemia (mild ) Cannot exclude some shifting soft tissue attenuation (breast)   ASSESSMENT:    1. CHEST PAIN UNSPECIFIED   2. Left bundle branch block   3. Essential hypertension   4. PVC (premature ventricular contraction)      PLAN:  In order of problems listed above:  1. Chest discomfort has resolved. There is no exertional component of dyspnea or intolerance. Cautioned to notify us if recurrent symptoms. 2. We discussed left bundle branch block. Also discovered that her father has left bundle branch block. No specific further workup at this time. 3. Well controlled. Low salt diet and weight control are advocated. 4. No workup or complaints at this time.    Medication Adjustments/Labs and Tests Ordered: Current medicines are reviewed at length with the patient today.  Concerns regarding medicines are outlined above.  Medication changes, Labs and Tests ordered today are listed in the Patient Instructions below. Patient Instructions  Medication Instructions:  Your physician recommends that you  continue on your current medications as directed. Please refer to the Current Medication list given to you today.   Labwork: None ordered  Testing/Procedures: None ordered  Follow-Up: Your physician wants you to follow-up in: 1 year with Dr.Tonisha Silvey You will receive a reminder letter in the mail two months in advance. If you don't receive a letter, please call our office to schedule the follow-up appointment.   Any Other Special Instructions Will Be Listed Below (If Applicable).     If you need a refill on your cardiac medications before your next appointment, please call your pharmacy.       Signed, Lesleigh NoeHenry W Leani Myron III, MD  12/16/2015 8:28 AM  [facility] [place], [place], [place]  [address] Phone: [phone]; Fax: [phone]

## 2015-12-16 ENCOUNTER — Encounter: Payer: Self-pay | Admitting: Interventional Cardiology

## 2015-12-16 ENCOUNTER — Ambulatory Visit (INDEPENDENT_AMBULATORY_CARE_PROVIDER_SITE_OTHER): Payer: Commercial Managed Care - HMO | Admitting: Interventional Cardiology

## 2015-12-16 VITALS — BP 128/82 | HR 64 | Ht 65.0 in | Wt 179.0 lb

## 2015-12-16 DIAGNOSIS — I493 Ventricular premature depolarization: Secondary | ICD-10-CM

## 2015-12-16 DIAGNOSIS — I447 Left bundle-branch block, unspecified: Secondary | ICD-10-CM

## 2015-12-16 DIAGNOSIS — R079 Chest pain, unspecified: Secondary | ICD-10-CM | POA: Diagnosis not present

## 2015-12-16 DIAGNOSIS — I10 Essential (primary) hypertension: Secondary | ICD-10-CM

## 2015-12-16 NOTE — Patient Instructions (Signed)

## 2015-12-30 ENCOUNTER — Ambulatory Visit (INDEPENDENT_AMBULATORY_CARE_PROVIDER_SITE_OTHER): Payer: Commercial Managed Care - HMO | Admitting: Family Medicine

## 2015-12-30 ENCOUNTER — Encounter: Payer: Self-pay | Admitting: Family Medicine

## 2015-12-30 VITALS — BP 142/80 | HR 52 | Temp 98.2°F | Ht 65.0 in | Wt 179.2 lb

## 2015-12-30 DIAGNOSIS — R319 Hematuria, unspecified: Secondary | ICD-10-CM | POA: Diagnosis not present

## 2015-12-30 DIAGNOSIS — Z0000 Encounter for general adult medical examination without abnormal findings: Secondary | ICD-10-CM | POA: Diagnosis not present

## 2015-12-30 DIAGNOSIS — Z1239 Encounter for other screening for malignant neoplasm of breast: Secondary | ICD-10-CM

## 2015-12-30 DIAGNOSIS — IMO0002 Reserved for concepts with insufficient information to code with codable children: Secondary | ICD-10-CM

## 2015-12-30 DIAGNOSIS — E538 Deficiency of other specified B group vitamins: Secondary | ICD-10-CM

## 2015-12-30 DIAGNOSIS — I10 Essential (primary) hypertension: Secondary | ICD-10-CM

## 2015-12-30 DIAGNOSIS — N904 Leukoplakia of vulva: Secondary | ICD-10-CM | POA: Diagnosis not present

## 2015-12-30 DIAGNOSIS — H60393 Other infective otitis externa, bilateral: Secondary | ICD-10-CM

## 2015-12-30 DIAGNOSIS — K219 Gastro-esophageal reflux disease without esophagitis: Secondary | ICD-10-CM | POA: Diagnosis not present

## 2015-12-30 DIAGNOSIS — L720 Epidermal cyst: Secondary | ICD-10-CM

## 2015-12-30 LAB — COMPREHENSIVE METABOLIC PANEL
ALK PHOS: 49 U/L (ref 39–117)
ALT: 14 U/L (ref 0–35)
AST: 14 U/L (ref 0–37)
Albumin: 4.3 g/dL (ref 3.5–5.2)
BILIRUBIN TOTAL: 0.7 mg/dL (ref 0.2–1.2)
BUN: 11 mg/dL (ref 6–23)
CO2: 24 meq/L (ref 19–32)
CREATININE: 0.8 mg/dL (ref 0.40–1.20)
Calcium: 9.7 mg/dL (ref 8.4–10.5)
Chloride: 108 mEq/L (ref 96–112)
GFR: 81.3 mL/min (ref >60.00)
GLUCOSE: 80 mg/dL (ref 70–99)
Potassium: 3.7 mEq/L (ref 3.5–5.1)
Sodium: 139 mEq/L (ref 135–145)
TOTAL PROTEIN: 7.3 g/dL (ref 6.0–8.3)

## 2015-12-30 LAB — CBC WITH DIFFERENTIAL/PLATELET
BASOS ABS: 0 10*3/uL (ref 0.0–0.1)
Basophils Relative: 0.2 % (ref 0.0–3.0)
EOS PCT: 0.7 % (ref 0.0–5.0)
Eosinophils Absolute: 0.1 10*3/uL (ref 0.0–0.7)
HEMATOCRIT: 43.2 % (ref 36.0–46.0)
HEMOGLOBIN: 14.6 g/dL (ref 12.0–15.0)
LYMPHS ABS: 2.4 10*3/uL (ref 0.7–4.0)
LYMPHS PCT: 24.7 % (ref 12.0–46.0)
MCHC: 33.7 g/dL (ref 30.0–36.0)
MCV: 93.8 fl (ref 78.0–100.0)
MONOS PCT: 5 % (ref 3.0–12.0)
Monocytes Absolute: 0.5 10*3/uL (ref 0.1–1.0)
NEUTROS PCT: 69.4 % (ref 43.0–77.0)
Neutro Abs: 6.7 10*3/uL (ref 1.4–7.7)
Platelets: 234 10*3/uL (ref 150.0–400.0)
RBC: 4.6 Mil/uL (ref 3.87–5.11)
RDW: 13.7 % (ref 11.5–15.5)
WBC: 9.7 10*3/uL (ref 4.0–10.5)

## 2015-12-30 LAB — LIPID PANEL
CHOL/HDL RATIO: 5
Cholesterol: 185 mg/dL (ref 0–200)
HDL: 38.2 mg/dL — ABNORMAL LOW (ref >39.00)
LDL Cholesterol: 133 mg/dL — ABNORMAL HIGH (ref 0–99)
NONHDL: 146.69
Triglycerides: 69 mg/dL (ref 0.0–149.0)
VLDL: 13.8 mg/dL (ref 0.0–40.0)

## 2015-12-30 LAB — TSH: TSH: 1.14 u[IU]/mL (ref 0.35–4.50)

## 2015-12-30 LAB — POCT URINALYSIS DIPSTICK
BILIRUBIN UA: NEGATIVE
Glucose, UA: NEGATIVE
Ketones, UA: NEGATIVE
LEUKOCYTES UA: NEGATIVE
NITRITE UA: NEGATIVE
PH UA: 6.5
PROTEIN UA: NEGATIVE
Spec Grav, UA: 1.01
Urobilinogen, UA: 0.2

## 2015-12-30 LAB — VITAMIN B12: VITAMIN B 12: 599 pg/mL (ref 211–911)

## 2015-12-30 MED ORDER — ATENOLOL 100 MG PO TABS: 100.0000 mg | ORAL_TABLET | Freq: Every day | ORAL | 1 refills | Status: DC

## 2015-12-30 MED ORDER — OMEPRAZOLE 40 MG PO CPDR: 40.0000 mg | DELAYED_RELEASE_CAPSULE | Freq: Every day | ORAL | 3 refills | Status: DC

## 2015-12-30 MED ORDER — ACETIC ACID 2 % OT SOLN: 4.0000 [drp] | Freq: Three times a day (TID) | OTIC | 0 refills | Status: DC

## 2015-12-30 NOTE — Progress Notes (Signed)
Subjective:     [name] Ramos is a 50 y.o. female and is here for a comprehensive physical exam. The patient reports problems - bump R inner thigh close to labia ,  non tender  not draining. Pt is also c/o draining from ears, crusty,itchy, no other symptoms--- pt understands this will not be included in cpe and she will be billed another copay-- she Is ok with this.     Social History   Social History  . Marital status: Single    Spouse name: N/A  . Number of children: N/A  . 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 Smoking status: Current Every Day Smoker    Packs/day: 0.50    Years: 24.00    Types: Cigarettes    Last attempt to quit: 07/02/2008  . Smokeless tobacco: Never Used  . Alcohol use 0.0 oz/week     Comment: drinks daily  . Drug use: No  . Sexual activity: Not Currently    Partners: Male   Other Topics Concern  . Not on file   Social History Narrative   Exercising---walking dog daily   Health Maintenance  Topic Date Due  . HIV Screening  08/28/2035 (Originally 10/16/1980)  . INFLUENZA VACCINE  12/31/2015  . TETANUS/TDAP  01/20/2017  . PAP SMEAR  11/28/2017    The following portions of the patient's history were reviewed and updated as appropriate: She  has a past medical history of Alcohol abuse; Allergic urticaria; Anxiety; Cholecystitis, acute; Congenital anomaly of neck; Depression; Dyslipidemia; Gastroparesis; GERD (gastroesophageal reflux disease); Hematuria; Hiatal hernia; Hypertension; Kidney stone; Obesity; PVC's (premature ventricular contractions); Thyroid nodule; Tinea corporis; Unspecified contraceptive management; and Urinary tract infection. She  does not have any pertinent problems on file. She  has a past surgical history that includes Tonsillectomy and leep surgery. Her family history includes Breast cancer in her mother; Colon polyps in her mother; Coronary artery disease in  her other; Diabetes in her maternal grandmother and mother; Heart attack (age of onset: 68) in her maternal grandmother; Hypertension in her other; Irritable bowel syndrome in her mother; Liver disease in her mother; Lung cancer in her maternal grandmother, maternal uncle, and paternal grandmother; Thyroid disease in her mother. She  reports that she has been smoking Cigarettes.  She has a 12.00 pack-year smoking history. She has never used smokeless tobacco. She reports that she drinks alcohol. She reports that she does not use drugs. She has a current medication list which includes the following prescription(s): alprazolam, aspirin, atenolol, cyanocobalamin, medroxyprogesterone, omeprazole, and acetic acid. Current Outpatient Prescriptions on File Prior to Visit  Medication Sig Dispense Refill  . ALPRAZolam (XANAX) 0.25 MG tablet Take 1 tablet (0.25 mg total) by mouth 3 (three) times daily as needed for anxiety. 30 tablet 0  . aspirin 81 MG tablet Take 81 mg by mouth daily.    . cyanocobalamin (,VITAMIN B-12,) 1000 MCG/ML injection Inject 1 mL (1,000 mcg total) into the muscle once. 4 mL 0  . medroxyPROGESTERone (DEPO-PROVERA) 150 MG/ML injection AS DIRECTED EVERY 3 MONTHS 1 mL 4  . [DISCONTINUED] oxybutynin (DITROPAN-XL) 10 MG 24 hr tablet Take 10 mg by mouth daily.     No current facility-administered medications on file prior to visit.    She is allergic to piroxicam..  Review of Systems Review of Systems  Constitutional: Negative for activity change, appetite change and fatigue.  HENT: Negative for  hearing loss, congestion, tinnitus and ear discharge.  dentist q30m Eyes: Negative for visual disturbance (see optho q1y -- vision corrected to 20/20 with glasses).  Respiratory: Negative for cough, chest tightness and shortness of breath.   Cardiovascular: Negative for chest pain, palpitations and leg swelling.  Gastrointestinal: Negative for abdominal pain, diarrhea, constipation and abdominal  distention.  Genitourinary: Negative for urgency, frequency, decreased urine volume and difficulty urinating.  Musculoskeletal: Negative for back pain, arthralgias and gait problem.  Skin: Negative for color change, pallor and rash.  Neurological: Negative for dizziness, light-headedness, numbness and headaches.  Hematological: Negative for adenopathy. Does not bruise/bleed easily.  Psychiatric/Behavioral: Negative for suicidal ideas, confusion, sleep disturbance, self-injury, dysphoric mood, decreased concentration and agitation.       Objective:    BP (!) 142/80 (BP Location: Right Arm, Patient Position: Sitting, Cuff Size: Normal)   Pulse (!) 52   Temp 98.2 F (36.8 C) (Oral)   Ht 5\' 5"  (1.651 m)   Wt 179 lb 3.2 oz (81.3 kg)   SpO2 100%   BMI 29.82 kg/m  General appearance: alert, cooperative, appears stated age and no distress Head: Normocephalic, without obvious abnormality, atraumatic Eyes: conjunctivae/corneas clear. PERRL, EOM's intact. Fundi benign. Ears: tm I b/l , ext ears + scabbed,  + dry skin, no drainage Nose: Nares normal. Septum midline. Mucosa normal. No drainage or sinus tenderness. Throat: lips, mucosa, and tongue normal; teeth and gums normal Neck: no adenopathy, no carotid bruit, no JVD, supple, symmetrical, trachea midline and thyroid not enlarged, symmetric, no tenderness/mass/nodules Back: symmetric, no curvature. ROM normal. No CVA tenderness. Lungs: clear to auscultation bilaterally Breasts: normal appearance, no masses or tenderness Heart: regular rate and rhythm, S1, S2 normal, no murmur, click, rub or gallop Abdomen: soft, non-tender; bowel sounds normal; no masses,  no organomegaly Pelvic: positive findings: cyst lateral to R labia,  and tenderness with white plaque around clitoris Extremities: extremities normal, atraumatic, no cyanosis or edema Pulses: 2+ and symmetric Skin: Skin color, texture, turgor normal. No rashes or lesions Lymph nodes:  Cervical, supraclavicular, and axillary nodes normal. Neurologic: Alert and oriented X 3, normal strength and tone. Normal symmetric reflexes. Normal coordination and gait    Assessment:    Healthy female exam.      Plan:    ghm utd Check labs See After Visit Summary for Counseling Recommendations     1. Gastroesophageal reflux disease, esophagitis presence not specified stable - omeprazole (PRILOSEC) 40 MG capsule; Take 1 capsule (40 mg total) by mouth daily.  Dispense: 90 capsule; Refill: 3  2. Preventative health care See above - atenolol (TENORMIN) 100 MG tablet; Take 1 tablet (100 mg total) by mouth daily.  Dispense: 90 tablet; Refill: 1 - Comprehensive metabolic panel - Lipid panel - CBC with Differential/Platelet - POCT urinalysis dipstick - TSH  3. Essential hypertension stable - atenolol (TENORMIN) 100 MG tablet; Take 1 tablet (100 mg total) by mouth daily.  Dispense: 90 tablet; Refill: 1 - Comprehensive metabolic panel - Lipid panel - CBC with Differential/Platelet - POCT urinalysis dipstick - TSH  4. Vitamin B 12 deficiency  - Vitamin B12  5. Lichen sclerosus of female genitalia Refer gyn  6. Groin cyst   - Ambulatory referral to Gynecology  7. Otitis, externa, infective, bilateral Neosporin for ext ear - acetic acid (VOSOL) 2 % otic solution; Place 4 drops into both ears 3 (three) times daily.  Dispense: 15 mL; Refill: 0

## 2015-12-30 NOTE — Progress Notes (Signed)
Pre visit review using our clinic review tool, if applicable. No additional management support is needed unless otherwise documented below in the visit note. 

## 2015-12-30 NOTE — Patient Instructions (Signed)
Preventive Care for Adults, Female A healthy lifestyle and preventive care can promote health and wellness. Preventive health guidelines for women include the following key practices.  A routine yearly physical is a good way to check with your health care provider about your health and preventive screening. It is a chance to share any concerns and updates on your health and to receive a thorough exam.  Visit your dentist for a routine exam and preventive care every 6 months. Brush your teeth twice a day and floss once a day. Good oral hygiene prevents tooth decay and gum disease.  The frequency of eye exams is based on your age, health, family medical history, use of contact lenses, and other factors. Follow your health care provider's recommendations for frequency of eye exams.  Eat a healthy diet. Foods like vegetables, fruits, whole grains, low-fat dairy products, and lean protein foods contain the nutrients you need without too many calories. Decrease your intake of foods high in solid fats, added sugars, and salt. Eat the right amount of calories for you.Get information about a proper diet from your health care provider, if necessary.  Regular physical exercise is one of the most important things you can do for your health. Most adults should get at least 150 minutes of moderate-intensity exercise (any activity that increases your heart rate and causes you to sweat) each week. In addition, most adults need muscle-strengthening exercises on 2 or more days a week.  Maintain a healthy weight. The body mass index (BMI) is a screening tool to identify possible weight problems. It provides an estimate of body fat based on height and weight. Your health care provider can find your BMI and can help you achieve or maintain a healthy weight.For adults 20 years and older:  A BMI below 18.5 is considered underweight.  A BMI of 18.5 to 24.9 is normal.  A BMI of 25 to 29.9 is considered overweight.  A  BMI of 30 and above is considered obese.  Maintain normal blood lipids and cholesterol levels by exercising and minimizing your intake of saturated fat. Eat a balanced diet with plenty of fruit and vegetables. Blood tests for lipids and cholesterol should begin at age 45 and be repeated every 5 years. If your lipid or cholesterol levels are high, you are over 50, or you are at high risk for heart disease, you may need your cholesterol levels checked more frequently.Ongoing high lipid and cholesterol levels should be treated with medicines if diet and exercise are not working.  If you smoke, find out from your health care provider how to quit. If you do not use tobacco, do not start.  Lung cancer screening is recommended for adults aged 45-80 years who are at high risk for developing lung cancer because of a history of smoking. A yearly low-dose CT scan of the lungs is recommended for people who have at least a 30-pack-year history of smoking and are a current smoker or have quit within the past 15 years. A pack year of smoking is smoking an average of 1 pack of cigarettes a day for 1 year (for example: 1 pack a day for 30 years or 2 packs a day for 15 years). Yearly screening should continue until the smoker has stopped smoking for at least 15 years. Yearly screening should be stopped for people who develop a health problem that would prevent them from having lung cancer treatment.  If you are pregnant, do not drink alcohol. If you are  breastfeeding, be very cautious about drinking alcohol. If you are not pregnant and choose to drink alcohol, do not have more than 1 drink per day. One drink is considered to be 12 ounces (355 mL) of beer, 5 ounces (148 mL) of wine, or 1.5 ounces (44 mL) of liquor.  Avoid use of street drugs. Do not share needles with anyone. Ask for help if you need support or instructions about stopping the use of drugs.  High blood pressure causes heart disease and increases the risk  of stroke. Your blood pressure should be checked at least every 1 to 2 years. Ongoing high blood pressure should be treated with medicines if weight loss and exercise do not work.  If you are 55-79 years old, ask your health care provider if you should take aspirin to prevent strokes.  Diabetes screening is done by taking a blood sample to check your blood glucose level after you have not eaten for a certain period of time (fasting). If you are not overweight and you do not have risk factors for diabetes, you should be screened once every 3 years starting at age 45. If you are overweight or obese and you are 40-70 years of age, you should be screened for diabetes every year as part of your cardiovascular risk assessment.  Breast cancer screening is essential preventive care for women. You should practice "breast self-awareness." This means understanding the normal appearance and feel of your breasts and may include breast self-examination. Any changes detected, no matter how small, should be reported to a health care provider. Women in their 20s and 30s should have a clinical breast exam (CBE) by a health care provider as part of a regular health exam every 1 to 3 years. After age 40, women should have a CBE every year. Starting at age 40, women should consider having a mammogram (breast X-ray test) every year. Women who have a family history of breast cancer should talk to their health care provider about genetic screening. Women at a high risk of breast cancer should talk to their health care providers about having an MRI and a mammogram every year.  Breast cancer gene (BRCA)-related cancer risk assessment is recommended for women who have family members with BRCA-related cancers. BRCA-related cancers include breast, ovarian, tubal, and peritoneal cancers. Having family members with these cancers may be associated with an increased risk for harmful changes (mutations) in the breast cancer genes BRCA1 and  BRCA2. Results of the assessment will determine the need for genetic counseling and BRCA1 and BRCA2 testing.  Your health care provider may recommend that you be screened regularly for cancer of the pelvic organs (ovaries, uterus, and vagina). This screening involves a pelvic examination, including checking for microscopic changes to the surface of your cervix (Pap test). You may be encouraged to have this screening done every 3 years, beginning at age 21.  For women ages 30-65, health care providers may recommend pelvic exams and Pap testing every 3 years, or they may recommend the Pap and pelvic exam, combined with testing for human papilloma virus (HPV), every 5 years. Some types of HPV increase your risk of cervical cancer. Testing for HPV may also be done on women of any age with unclear Pap test results.  Other health care providers may not recommend any screening for nonpregnant women who are considered low risk for pelvic cancer and who do not have symptoms. Ask your health care provider if a screening pelvic exam is right for   you.  If you have had past treatment for cervical cancer or a condition that could lead to cancer, you need Pap tests and screening for cancer for at least 20 years after your treatment. If Pap tests have been discontinued, your risk factors (such as having a new sexual partner) need to be reassessed to determine if screening should resume. Some women have medical problems that increase the chance of getting cervical cancer. In these cases, your health care provider may recommend more frequent screening and Pap tests.  Colorectal cancer can be detected and often prevented. Most routine colorectal cancer screening begins at the age of 50 years and continues through age 75 years. However, your health care provider may recommend screening at an earlier age if you have risk factors for colon cancer. On a yearly basis, your health care provider may provide home test kits to check  for hidden blood in the stool. Use of a small camera at the end of a tube, to directly examine the colon (sigmoidoscopy or colonoscopy), can detect the earliest forms of colorectal cancer. Talk to your health care provider about this at age 50, when routine screening begins. Direct exam of the colon should be repeated every 5-10 years through age 75 years, unless early forms of precancerous polyps or small growths are found.  People who are at an increased risk for hepatitis B should be screened for this virus. You are considered at high risk for hepatitis B if:  You were born in a country where hepatitis B occurs often. Talk with your health care provider about which countries are considered high risk.  Your parents were born in a high-risk country and you have not received a shot to protect against hepatitis B (hepatitis B vaccine).  You have HIV or AIDS.  You use needles to inject street drugs.  You live with, or have sex with, someone who has hepatitis B.  You get hemodialysis treatment.  You take certain medicines for conditions like cancer, organ transplantation, and autoimmune conditions.  Hepatitis C blood testing is recommended for all people born from 1945 through 1965 and any individual with known risks for hepatitis C.  Practice safe sex. Use condoms and avoid high-risk sexual practices to reduce the spread of sexually transmitted infections (STIs). STIs include gonorrhea, chlamydia, syphilis, trichomonas, herpes, HPV, and human immunodeficiency virus (HIV). Herpes, HIV, and HPV are viral illnesses that have no cure. They can result in disability, cancer, and death.  You should be screened for sexually transmitted illnesses (STIs) including gonorrhea and chlamydia if:  You are sexually active and are younger than 24 years.  You are older than 24 years and your health care provider tells you that you are at risk for this type of infection.  Your sexual activity has changed  since you were last screened and you are at an increased risk for chlamydia or gonorrhea. Ask your health care provider if you are at risk.  If you are at risk of being infected with HIV, it is recommended that you take a prescription medicine daily to prevent HIV infection. This is called preexposure prophylaxis (PrEP). You are considered at risk if:  You are sexually active and do not regularly use condoms or know the HIV status of your partner(s).  You take drugs by injection.  You are sexually active with a partner who has HIV.  Talk with your health care provider about whether you are at high risk of being infected with HIV. If   you choose to begin PrEP, you should first be tested for HIV. You should then be tested every 3 months for as long as you are taking PrEP.  Osteoporosis is a disease in which the bones lose minerals and strength with aging. This can result in serious bone fractures or breaks. The risk of osteoporosis can be identified using a bone density scan. Women ages 67 years and over and women at risk for fractures or osteoporosis should discuss screening with their health care providers. Ask your health care provider whether you should take a calcium supplement or vitamin D to reduce the rate of osteoporosis.  Menopause can be associated with physical symptoms and risks. Hormone replacement therapy is available to decrease symptoms and risks. You should talk to your health care provider about whether hormone replacement therapy is right for you.  Use sunscreen. Apply sunscreen liberally and repeatedly throughout the day. You should seek shade when your shadow is shorter than you. Protect yourself by wearing long sleeves, pants, a wide-brimmed hat, and sunglasses year round, whenever you are outdoors.  Once a month, do a whole body skin exam, using a mirror to look at the skin on your back. Tell your health care provider of new moles, moles that have irregular borders, moles that  are larger than a pencil eraser, or moles that have changed in shape or color.  Stay current with required vaccines (immunizations).  Influenza vaccine. All adults should be immunized every year.  Tetanus, diphtheria, and acellular pertussis (Td, Tdap) vaccine. Pregnant women should receive 1 dose of Tdap vaccine during each pregnancy. The dose should be obtained regardless of the length of time since the last dose. Immunization is preferred during the 27th-36th week of gestation. An adult who has not previously received Tdap or who does not know her vaccine status should receive 1 dose of Tdap. This initial dose should be followed by tetanus and diphtheria toxoids (Td) booster doses every 10 years. Adults with an unknown or incomplete history of completing a 3-dose immunization series with Td-containing vaccines should begin or complete a primary immunization series including a Tdap dose. Adults should receive a Td booster every 10 years.  Varicella vaccine. An adult without evidence of immunity to varicella should receive 2 doses or a second dose if she has previously received 1 dose. Pregnant females who do not have evidence of immunity should receive the first dose after pregnancy. This first dose should be obtained before leaving the health care facility. The second dose should be obtained 4-8 weeks after the first dose.  Human papillomavirus (HPV) vaccine. Females aged 13-26 years who have not received the vaccine previously should obtain the 3-dose series. The vaccine is not recommended for use in pregnant females. However, pregnancy testing is not needed before receiving a dose. If a female is found to be pregnant after receiving a dose, no treatment is needed. In that case, the remaining doses should be delayed until after the pregnancy. Immunization is recommended for any person with an immunocompromised condition through the age of 61 years if she did not get any or all doses earlier. During the  3-dose series, the second dose should be obtained 4-8 weeks after the first dose. The third dose should be obtained 24 weeks after the first dose and 16 weeks after the second dose.  Zoster vaccine. One dose is recommended for adults aged 30 years or older unless certain conditions are present.  Measles, mumps, and rubella (MMR) vaccine. Adults born  before 1957 generally are considered immune to measles and mumps. Adults born in 1957 or later should have 1 or more doses of MMR vaccine unless there is a contraindication to the vaccine or there is laboratory evidence of immunity to each of the three diseases. A routine second dose of MMR vaccine should be obtained at least 28 days after the first dose for students attending postsecondary schools, health care workers, or international travelers. People who received inactivated measles vaccine or an unknown type of measles vaccine during 1963-1967 should receive 2 doses of MMR vaccine. People who received inactivated mumps vaccine or an unknown type of mumps vaccine before 1979 and are at high risk for mumps infection should consider immunization with 2 doses of MMR vaccine. For females of childbearing age, rubella immunity should be determined. If there is no evidence of immunity, females who are not pregnant should be vaccinated. If there is no evidence of immunity, females who are pregnant should delay immunization until after pregnancy. Unvaccinated health care workers born before 1957 who lack laboratory evidence of measles, mumps, or rubella immunity or laboratory confirmation of disease should consider measles and mumps immunization with 2 doses of MMR vaccine or rubella immunization with 1 dose of MMR vaccine.  Pneumococcal 13-valent conjugate (PCV13) vaccine. When indicated, a person who is uncertain of his immunization history and has no record of immunization should receive the PCV13 vaccine. All adults 65 years of age and older should receive this  vaccine. An adult aged 19 years or older who has certain medical conditions and has not been previously immunized should receive 1 dose of PCV13 vaccine. This PCV13 should be followed with a dose of pneumococcal polysaccharide (PPSV23) vaccine. Adults who are at high risk for pneumococcal disease should obtain the PPSV23 vaccine at least 8 weeks after the dose of PCV13 vaccine. Adults older than 48 years of age who have normal immune system function should obtain the PPSV23 vaccine dose at least 1 year after the dose of PCV13 vaccine.  Pneumococcal polysaccharide (PPSV23) vaccine. When PCV13 is also indicated, PCV13 should be obtained first. All adults aged 65 years and older should be immunized. An adult younger than age 65 years who has certain medical conditions should be immunized. Any person who resides in a nursing home or long-term care facility should be immunized. An adult smoker should be immunized. People with an immunocompromised condition and certain other conditions should receive both PCV13 and PPSV23 vaccines. People with human immunodeficiency virus (HIV) infection should be immunized as soon as possible after diagnosis. Immunization during chemotherapy or radiation therapy should be avoided. Routine use of PPSV23 vaccine is not recommended for American Indians, [place] Natives, or people younger than 65 years unless there are medical conditions that require PPSV23 vaccine. When indicated, people who have unknown immunization and have no record of immunization should receive PPSV23 vaccine. One-time revaccination 5 years after the first dose of PPSV23 is recommended for people aged 19-64 years who have chronic kidney failure, nephrotic syndrome, asplenia, or immunocompromised conditions. People who received 1-2 doses of PPSV23 before age 65 years should receive another dose of PPSV23 vaccine at age 65 years or later if at least 5 years have passed since the previous dose. Doses of PPSV23 are not  needed for people immunized with PPSV23 at or after age 65 years.  Meningococcal vaccine. Adults with asplenia or persistent complement component deficiencies should receive 2 doses of quadrivalent meningococcal conjugate (MenACWY-D) vaccine. The doses should be obtained   at least 2 months apart. Microbiologists working with certain meningococcal bacteria, [place] recruits, people at risk during an outbreak, and people who travel to or live in countries with a high rate of meningitis should be immunized. A first-year college student up through age 34 years who is living in a residence hall should receive a dose if she did not receive a dose on or after her 16th birthday. Adults who have certain high-risk conditions should receive one or more doses of vaccine.  Hepatitis A vaccine. Adults who wish to be protected from this disease, have certain high-risk conditions, work with hepatitis A-infected animals, work in hepatitis A research labs, or travel to or work in countries with a high rate of hepatitis A should be immunized. Adults who were previously unvaccinated and who anticipate close contact with an international adoptee during the first 60 days after arrival in the [place] from a country with a high rate of hepatitis A should be immunized.  Hepatitis B vaccine. Adults who wish to be protected from this disease, have certain high-risk conditions, may be exposed to blood or other infectious body fluids, are household contacts or sex partners of hepatitis B positive people, are clients or workers in certain care facilities, or travel to or work in countries with a high rate of hepatitis B should be immunized.  Haemophilus influenzae type b (Hib) vaccine. A previously unvaccinated person with asplenia or sickle cell disease or having a scheduled splenectomy should receive 1 dose of Hib vaccine. Regardless of previous immunization, a recipient of a hematopoietic stem cell transplant should receive a  3-dose series 6-12 months after her successful transplant. Hib vaccine is not recommended for adults with HIV infection. Preventive Services / Frequency Ages 35 to 4 years  Blood pressure check.** / Every 3-5 years.  Lipid and cholesterol check.** / Every 5 years beginning at age 60.  Clinical breast exam.** / Every 3 years for women in their 71s and 10s.  BRCA-related cancer risk assessment.** / For women who have family members with a BRCA-related cancer (breast, ovarian, tubal, or peritoneal cancers).  Pap test.** / Every 2 years from ages 76 through 26. Every 3 years starting at age 61 through age 76 or 93 with a history of 3 consecutive normal Pap tests.  HPV screening.** / Every 3 years from ages 37 through ages 60 to 51 with a history of 3 consecutive normal Pap tests.  Hepatitis C blood test.** / For any individual with known risks for hepatitis C.  Skin self-exam. / Monthly.  Influenza vaccine. / Every year.  Tetanus, diphtheria, and acellular pertussis (Tdap, Td) vaccine.** / Consult your health care provider. Pregnant women should receive 1 dose of Tdap vaccine during each pregnancy. 1 dose of Td every 10 years.  Varicella vaccine.** / Consult your health care provider. Pregnant females who do not have evidence of immunity should receive the first dose after pregnancy.  HPV vaccine. / 3 doses over 6 months, if 93 and younger. The vaccine is not recommended for use in pregnant females. However, pregnancy testing is not needed before receiving a dose.  Measles, mumps, rubella (MMR) vaccine.** / You need at least 1 dose of MMR if you were born in 1957 or later. You may also need a 2nd dose. For females of childbearing age, rubella immunity should be determined. If there is no evidence of immunity, females who are not pregnant should be vaccinated. If there is no evidence of immunity, females who are  pregnant should delay immunization until after pregnancy.  Pneumococcal  13-valent conjugate (PCV13) vaccine.** / Consult your health care provider.  Pneumococcal polysaccharide (PPSV23) vaccine.** / 1 to 2 doses if you smoke cigarettes or if you have certain conditions.  Meningococcal vaccine.** / 1 dose if you are age 68 to 8 years and a Market researcher living in a residence hall, or have one of several medical conditions, you need to get vaccinated against meningococcal disease. You may also need additional booster doses.  Hepatitis A vaccine.** / Consult your health care provider.  Hepatitis B vaccine.** / Consult your health care provider.  Haemophilus influenzae type b (Hib) vaccine.** / Consult your health care provider. Ages 7 to 53 years  Blood pressure check.** / Every year.  Lipid and cholesterol check.** / Every 5 years beginning at age 25 years.  Lung cancer screening. / Every year if you are aged 11-80 years and have a 30-pack-year history of smoking and currently smoke or have quit within the past 15 years. Yearly screening is stopped once you have quit smoking for at least 15 years or develop a health problem that would prevent you from having lung cancer treatment.  Clinical breast exam.** / Every year after age 48 years.  BRCA-related cancer risk assessment.** / For women who have family members with a BRCA-related cancer (breast, ovarian, tubal, or peritoneal cancers).  Mammogram.** / Every year beginning at age 41 years and continuing for as long as you are in good health. Consult with your health care provider.  Pap test.** / Every 3 years starting at age 65 years through age 37 or 70 years with a history of 3 consecutive normal Pap tests.  HPV screening.** / Every 3 years from ages 72 years through ages 60 to 40 years with a history of 3 consecutive normal Pap tests.  Fecal occult blood test (FOBT) of stool. / Every year beginning at age 21 years and continuing until age 5 years. You may not need to do this test if you get  a colonoscopy every 10 years.  Flexible sigmoidoscopy or colonoscopy.** / Every 5 years for a flexible sigmoidoscopy or every 10 years for a colonoscopy beginning at age 35 years and continuing until age 48 years.  Hepatitis C blood test.** / For all people born from [birth year] through 1965 and any individual with known risks for hepatitis C.  Skin self-exam. / Monthly.  Influenza vaccine. / Every year.  Tetanus, diphtheria, and acellular pertussis (Tdap/Td) vaccine.** / Consult your health care provider. Pregnant women should receive 1 dose of Tdap vaccine during each pregnancy. 1 dose of Td every 10 years.  Varicella vaccine.** / Consult your health care provider. Pregnant females who do not have evidence of immunity should receive the first dose after pregnancy.  Zoster vaccine.** / 1 dose for adults aged 30 years or older.  Measles, mumps, rubella (MMR) vaccine.** / You need at least 1 dose of MMR if you were born in 1957 or later. You may also need a second dose. For females of childbearing age, rubella immunity should be determined. If there is no evidence of immunity, females who are not pregnant should be vaccinated. If there is no evidence of immunity, females who are pregnant should delay immunization until after pregnancy.  Pneumococcal 13-valent conjugate (PCV13) vaccine.** / Consult your health care provider.  Pneumococcal polysaccharide (PPSV23) vaccine.** / 1 to 2 doses if you smoke cigarettes or if you have certain conditions.  Meningococcal vaccine.** /  Consult your health care provider.  Hepatitis A vaccine.** / Consult your health care provider.  Hepatitis B vaccine.** / Consult your health care provider.  Haemophilus influenzae type b (Hib) vaccine.** / Consult your health care provider. Ages 64 years and over  Blood pressure check.** / Every year.  Lipid and cholesterol check.** / Every 5 years beginning at age 23 years.  Lung cancer screening. / Every year if you  are aged 16-80 years and have a 30-pack-year history of smoking and currently smoke or have quit within the past 15 years. Yearly screening is stopped once you have quit smoking for at least 15 years or develop a health problem that would prevent you from having lung cancer treatment.  Clinical breast exam.** / Every year after age 74 years.  BRCA-related cancer risk assessment.** / For women who have family members with a BRCA-related cancer (breast, ovarian, tubal, or peritoneal cancers).  Mammogram.** / Every year beginning at age 44 years and continuing for as long as you are in good health. Consult with your health care provider.  Pap test.** / Every 3 years starting at age 58 years through age 22 or 39 years with 3 consecutive normal Pap tests. Testing can be stopped between 65 and 70 years with 3 consecutive normal Pap tests and no abnormal Pap or HPV tests in the past 10 years.  HPV screening.** / Every 3 years from ages 64 years through ages 70 or 61 years with a history of 3 consecutive normal Pap tests. Testing can be stopped between 65 and 70 years with 3 consecutive normal Pap tests and no abnormal Pap or HPV tests in the past 10 years.  Fecal occult blood test (FOBT) of stool. / Every year beginning at age 40 years and continuing until age 27 years. You may not need to do this test if you get a colonoscopy every 10 years.  Flexible sigmoidoscopy or colonoscopy.** / Every 5 years for a flexible sigmoidoscopy or every 10 years for a colonoscopy beginning at age 7 years and continuing until age 32 years.  Hepatitis C blood test.** / For all people born from [birth year] through 1963 and any individual with known risks for hepatitis C.  Osteoporosis screening.** / A one-time screening for women ages 30 years and over and women at risk for fractures or osteoporosis.  Skin self-exam. / Monthly.  Influenza vaccine. / Every year.  Tetanus, diphtheria, and acellular pertussis (Tdap/Td)  vaccine.** / 1 dose of Td every 10 years.  Varicella vaccine.** / Consult your health care provider.  Zoster vaccine.** / 1 dose for adults aged 35 years or older.  Pneumococcal 13-valent conjugate (PCV13) vaccine.** / Consult your health care provider.  Pneumococcal polysaccharide (PPSV23) vaccine.** / 1 dose for all adults aged 46 years and older.  Meningococcal vaccine.** / Consult your health care provider.  Hepatitis A vaccine.** / Consult your health care provider.  Hepatitis B vaccine.** / Consult your health care provider.  Haemophilus influenzae type b (Hib) vaccine.** / Consult your health care provider. ** Family history and personal history of risk and conditions may change your health care provider's recommendations.   This information is not intended to replace advice given to you by your health care provider. Make sure you discuss any questions you have with your health care provider.   Document Released: 07/14/2001 Document Revised: 06/08/2014 Document Reviewed: 10/13/2010 Elsevier Interactive Patient Education [employer].

## 2015-12-31 LAB — URINE CULTURE

## 2016-01-22 ENCOUNTER — Other Ambulatory Visit (INDEPENDENT_AMBULATORY_CARE_PROVIDER_SITE_OTHER): Payer: Commercial Managed Care - HMO

## 2016-01-22 ENCOUNTER — Ambulatory Visit: Payer: Commercial Managed Care - HMO

## 2016-01-22 DIAGNOSIS — N904 Leukoplakia of vulva: Secondary | ICD-10-CM

## 2016-01-22 DIAGNOSIS — K219 Gastro-esophageal reflux disease without esophagitis: Secondary | ICD-10-CM | POA: Diagnosis not present

## 2016-01-22 DIAGNOSIS — E538 Deficiency of other specified B group vitamins: Secondary | ICD-10-CM | POA: Diagnosis not present

## 2016-01-22 DIAGNOSIS — L720 Epidermal cyst: Secondary | ICD-10-CM

## 2016-01-22 DIAGNOSIS — R319 Hematuria, unspecified: Secondary | ICD-10-CM

## 2016-01-22 DIAGNOSIS — I10 Essential (primary) hypertension: Secondary | ICD-10-CM

## 2016-01-22 DIAGNOSIS — Z0000 Encounter for general adult medical examination without abnormal findings: Secondary | ICD-10-CM

## 2016-01-22 DIAGNOSIS — IMO0002 Reserved for concepts with insufficient information to code with codable children: Secondary | ICD-10-CM

## 2016-01-22 DIAGNOSIS — Z1239 Encounter for other screening for malignant neoplasm of breast: Secondary | ICD-10-CM

## 2016-01-22 DIAGNOSIS — H60393 Other infective otitis externa, bilateral: Secondary | ICD-10-CM

## 2016-01-22 LAB — FECAL OCCULT BLOOD, IMMUNOCHEMICAL: Fecal Occult Bld: NEGATIVE

## 2016-01-22 NOTE — Addendum Note (Signed)
Addended by: Eustace QuailEABOLD, Deloyd Handy J on: 01/22/2016 10:16 AM   Modules accepted: Orders

## 2016-01-23 ENCOUNTER — Other Ambulatory Visit (HOSPITAL_COMMUNITY)
Admission: RE | Admit: 2016-01-23 | Discharge: 2016-01-23 | Disposition: A | Discharge status: Home / Self Care | Payer: Commercial Managed Care - HMO | Source: Ambulatory Visit | Attending: Obstetrics & Gynecology | Admitting: Obstetrics & Gynecology

## 2016-01-23 ENCOUNTER — Encounter: Payer: Self-pay | Admitting: Obstetrics & Gynecology

## 2016-01-23 ENCOUNTER — Ambulatory Visit (INDEPENDENT_AMBULATORY_CARE_PROVIDER_SITE_OTHER): Payer: Commercial Managed Care - HMO | Admitting: Obstetrics & Gynecology

## 2016-01-23 VITALS — BP 141/61 | HR 52 | Ht 65.0 in | Wt 178.0 lb

## 2016-01-23 DIAGNOSIS — L739 Follicular disorder, unspecified: Secondary | ICD-10-CM

## 2016-01-23 DIAGNOSIS — N904 Leukoplakia of vulva: Secondary | ICD-10-CM | POA: Diagnosis not present

## 2016-01-23 MED ORDER — CLOBETASOL PROPIONATE 0.05 % EX OINT: TOPICAL_OINTMENT | CUTANEOUS | 5 refills | Status: DC

## 2016-01-23 NOTE — Patient Instructions (Signed)
Vulva Biopsy, Care After  Refer to this sheet in the next few weeks. These instructions provide you with information on caring for yourself after your procedure. Your caregiver may also give you more specific instructions. Your treatment has been planned according to current medical practices, but problems sometimes occur. Call your caregiver if you have any problems or questions after your procedure.   HOME CARE INSTRUCTIONS  · Only take over-the-counter or prescription medicines for pain or discomfort as directed by your caregiver.  · Do not rub the biopsy area after urinating. Gently pat the area dry or use a bottle filled with warm water (peri-bottle) to clean the area. Gently wipe from front to back.  · Clean the area using water and mild soap. Gently pat the area dry.  · Check your biopsy site with a handheld mirror daily to be sure it is healing properly.  · Do not have intercourse for 3-4 days or as directed by your caregiver.  · Avoid exercise, such as running or biking, for 2-3 days or as directed by your caregiver.  · You may shower after the procedure. Avoid bathing and swimming until the sutures dissolve and healing is complete.  · Wear loose cotton underwear. Do not wear tight pants.  · Keep all follow-up appointments with your caregiver.  · Contact your caregiver to obtain your test results.  SEEK IMMEDIATE MEDICAL CARE IF:  · Your pain increases and medicine does not help it.  · Your vaginal area becomes swollen or red.  · You have fluid or an abnormally bad smell coming from your vagina.  · You have a fever.  MAKE SURE YOU:  · Understand these instructions.  · Will watch your condition.  · Will get help right away if you are not doing well or get worse.     This information is not intended to replace advice given to you by your health care provider. Make sure you discuss any questions you have with your health care provider.     Document Released: 05/04/2012 Document Revised: 06/08/2014 Document  Reviewed: 05/04/2012  Elsevier Interactive Patient Education ©2016 Elsevier Inc.

## 2016-01-23 NOTE — Progress Notes (Signed)
History:  50 y.o. 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  She reports vulvar itching and discomfort.  She also reports that she had a lesion on her vulva or perineum.  The following portions of the patient's history were reviewed and updated as appropriate: allergies, current medications, past family history, past medical history, past social history, past surgical history and problem list.  Review of Systems:  Pertinent items are noted in HPI.  Objective:  Physical Exam Blood pressure (!) 141/61, pulse (!) 52, height 5\' 5"  (1.651 m), weight 178 lb (80.7 kg). Gen: NAD Abd: Soft, nontender and nondistended Pelvic: pt with extensive hypopigmentation and thickened white plaques. The lesions are worse on the clitoris the lesions extend into the perineum.    VULVAR BIOPSY NOTE The indications for vulvar biopsy (rule out neoplasia, establish lichen sclerosus diagnosis) were reviewed.   Risks of the biopsy including pain, bleeding, infection, inadequate specimen, scarring and need for additional procedures  were discussed. The patient stated understanding and agreed to undergo procedure today. Consent was signed,  time out performed.  The patient's vulva was prepped with Betadine. 1% lidocaine was injected into labia majora on the right side..  A 5-mm punch biopsy was done, biopsy tissue was picked up with sterile forceps and sterile scissors were used to excise the lesion.  Small bleeding was noted and hemostasis was achieved using Monsel's solution.  The patient tolerated the procedure well.   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  Reviewed with pt the Bx done today. 10 min discussion done prior to the biopsy.   Post-procedure instructions  (pelvic rest for one week) were given to the patient. The patient is to call with heavy bleeding, fever greater than 100.4, foul smelling vaginal discharge or other concerns.   The patient will be return to clinic  in two weeks for discussion of results.  Shirlyn Savin L. Harraway-Smith, M.D., Evern CoreFACOG

## 2016-01-27 ENCOUNTER — Telehealth: Payer: Self-pay | Admitting: Obstetrics & Gynecology

## 2016-01-27 ENCOUNTER — Telehealth: Payer: Self-pay

## 2016-01-27 NOTE — Telephone Encounter (Signed)
-----   Message from Willodean Rosenthalarolyn Harraway-Smith, MD sent at 01/27/2016 10:05 AM EDT ----- I attempted to call pt re surg path. Please call her back later. Her bx showed lichen sclerosis.  She is on the correct treatment.  I will see her back in 3 months or sooner if needed.  Thx, clh-S

## 2016-01-27 NOTE — Telephone Encounter (Signed)
Patient returning dr. Harraway Smiths [name] Ramos. Made her aware that she does have lichen sclerosis and she should continue the clobetasol cream. Patient states she is using it twice aday and checked with provider that this is correct usage. Patient plans to follow up in one month with provider. Armandina StammerJennifer Vadim Centola RN BSN

## 2016-01-27 NOTE — Telephone Encounter (Signed)
TC to pt to review surg path. Left message.  Abreanna Drawdy L. Harraway-Smith, M.D., FACOG  

## 2016-01-31 ENCOUNTER — Ambulatory Visit (INDEPENDENT_AMBULATORY_CARE_PROVIDER_SITE_OTHER): Payer: Commercial Managed Care - HMO

## 2016-01-31 DIAGNOSIS — Z1239 Encounter for other screening for malignant neoplasm of breast: Secondary | ICD-10-CM

## 2016-01-31 DIAGNOSIS — Z1231 Encounter for screening mammogram for malignant neoplasm of breast: Secondary | ICD-10-CM

## 2016-02-20 ENCOUNTER — Telehealth: Payer: Self-pay | Admitting: Interventional Cardiology

## 2016-02-20 NOTE — Telephone Encounter (Signed)
New Message  Pt c/o medication issue:  1. Name of Medication: Atenolol  2. How are you currently taking this medication (dosage and times per day)? 100mg    3. Are you having a reaction (difficulty breathing--STAT)? No   4. What is your medication issue? Pt call requesting to speak with RN. Pt stats med atenolol with be discontinued and wants to know if there is an med that she would be able to switch to. Please call back to discuss

## 2016-02-20 NOTE — Telephone Encounter (Signed)
Routed to Dr.Smith to advise 

## 2016-02-21 MED ORDER — METOPROLOL SUCCINATE ER 100 MG PO TB24: 100.0000 mg | ORAL_TABLET | Freq: Every day | ORAL | 3 refills | Status: DC

## 2016-02-21 NOTE — Telephone Encounter (Signed)
Since Atenolol is on back order. Pt aware of Dr.Smith's recommendation to switch to Metoprolol Succ 100mg  daily. Rx [place] t [place] [facility] [address]

## 2016-02-21 NOTE — Telephone Encounter (Signed)
Follow up ° ° °Pt verbalized that she is returning call for rn °

## 2016-02-21 NOTE — Telephone Encounter (Signed)
Called to give pt Dr.Smith's recommendation to switch from Atenolol to Metoprolol. [email or web address]

## 2016-02-21 NOTE — Telephone Encounter (Signed)
Yes, we will switch to metoprolol succinate 100 mg daily.

## 2016-02-24 ENCOUNTER — Ambulatory Visit (INDEPENDENT_AMBULATORY_CARE_PROVIDER_SITE_OTHER): Payer: Commercial Managed Care - HMO | Admitting: Obstetrics & Gynecology

## 2016-02-24 ENCOUNTER — Encounter: Payer: Self-pay | Admitting: Obstetrics & Gynecology

## 2016-02-24 VITALS — BP 139/73 | HR 54 | Ht 65.0 in | Wt 175.0 lb

## 2016-02-24 DIAGNOSIS — L900 Lichen sclerosus et atrophicus: Secondary | ICD-10-CM | POA: Diagnosis not present

## 2016-02-24 NOTE — Progress Notes (Signed)
   Subjective:    Patient ID: [name] BaconHeidi Minus, female    DOB: [birth year], 50 y.o.   MRN: [id number]  HPI 50 yo lady here for follow up of her biopsy-proven lichen sclerosis. She has been using temovate BID and says that the itching has resolved.  She is not sexually active.   Review of Systems     Objective:   Physical Exam [id number] Breathing, conversing, and ambulating normally Vulva- some bilateral redness of her groin/ perianal area (She thinks that this is from sweating and I agree) She has some mild hypopigmented areas BL labia minora, no more white plaques Only a small amount of white changes at the vaginal introitus      Assessment & Plan:  Lichen sclerosis- responding well to temovate I have advised her that she can decrease the frequency of temovate application but that if symptoms return then she can go back to BID treatment. I have advised SVE monthly and MD vulvar exam q 6 months due to the increase risk of vulvar cancer.

## 2016-03-09 ENCOUNTER — Encounter: Payer: Self-pay | Admitting: Family Medicine

## 2016-03-09 ENCOUNTER — Ambulatory Visit (INDEPENDENT_AMBULATORY_CARE_PROVIDER_SITE_OTHER): Payer: Commercial Managed Care - HMO | Admitting: Family Medicine

## 2016-03-09 VITALS — BP 138/74 | HR 65 | Temp 98.0°F | Ht 65.0 in | Wt 172.8 lb

## 2016-03-09 DIAGNOSIS — K2970 Gastritis, unspecified, without bleeding: Secondary | ICD-10-CM | POA: Diagnosis not present

## 2016-03-09 MED ORDER — ONDANSETRON HCL 4 MG PO TABS
4.0000 mg | ORAL_TABLET | Freq: Three times a day (TID) | ORAL | 0 refills | Status: DC | PRN
Start: 2016-03-09 — End: 2016-07-02

## 2016-03-09 MED ORDER — DICYCLOMINE HCL 10 MG PO CAPS: 10.0000 mg | ORAL_CAPSULE | Freq: Three times a day (TID) | ORAL | 0 refills | Status: DC

## 2016-03-09 NOTE — Progress Notes (Signed)
Pre visit review using our clinic review tool, if applicable. No additional management support is needed unless otherwise documented below in the visit note. 

## 2016-03-09 NOTE — Progress Notes (Signed)
Chief Complaint  Patient presents with  . Abdominal Pain    Pt reports tenderness in abdomen and fever of 101 at noon/ Pt reports having BM more than usual but was regular bowek    [name] Ramos is here for abdominal pain.  Duration: 1 days Nighttime awakenings? No Bleeding? No Weight loss? No Palliation: Alka-seltzer Provocation: Pressure Associated symptoms: fever, nausea and fatigue Denies: blood in stool, urinary complaints Treatment to date: Alka-seltzer   ROS: Constitutional: No current fevers GI: No V/D/C, no bleeding + pain  Past Medical History:  Diagnosis Date  . Alcohol abuse    recovering alcoholic  . Allergic urticaria   . Anxiety   . Cholecystitis, acute   . Congenital anomaly of neck   . Depression   . Dyslipidemia    Followed by PCP  . Gastroparesis   . GERD (gastroesophageal reflux disease)   . Hematuria   . Hiatal hernia    with reflux  . Hypertension    stress test 11/2010 normal  . Kidney stone   . Obesity   . PVC's (premature ventricular contractions)    Controlled on beta blocker therapy;  Echo 9/10: EF 55-60%, grade 1 diastolic dysfunction, mild MR, mild LAE  . Thyroid nodule    hx  . Tinea corporis   . Unspecified contraceptive management   . Urinary tract infection    Family History  Problem Relation Age of Onset  . Diabetes Mother   . Liver disease Mother   . Irritable bowel syndrome Mother   . Colon polyps Mother   . Breast cancer Mother   . Thyroid disease Mother   . Lung cancer Maternal Uncle   . Lung cancer Maternal Grandmother   . Heart attack Maternal Grandmother 48  . Diabetes Maternal Grandmother   . Lung cancer Paternal Grandmother   . Coronary artery disease Other     female 1st degree relative 48 yo  . Hypertension Other    Past Surgical History:  Procedure Laterality Date  . leep surgery     mid 20's  . TONSILLECTOMY      Pulse 65   Temp 98 F (36.7 C) (Oral)   Ht 5\' 5"  (1.651 m)   Wt 172 lb 12.8 oz  (78.4 kg)   SpO2 98%   BMI 28.76 kg/m  Gen.: Awake, alert, appears stated age HEENT: His membranes moist without mucosal lesions Heart: Regular rate and rhythm without murmurs Lungs: Clear auscultation bilaterally, no rales or wheezing, normal effort without accessory muscle use. Abdomen: Bowel sounds are hyperactive. Abdomen is soft, TTP in the umbilicus, nondistended, no masses or organomegaly. Negative Murphy's, Rovsing's, McBurney's, and Carnett's sign. Psych: Age appropriate judgment and insight. Normal mood and affect.  Viral gastritis - Plan: ondansetron (ZOFRAN) 4 MG tablet, dicyclomine (BENTYL) 10 MG capsule  Orders as above.  Letter for work given. The patient did think it was diverticulitis. I did tell her that she is a little young for this diagnosis and lacking a fever and left lower quadrant pain. Her exam does not support this as well with a soft belly. F/u prn. Pt voiced understanding and agreement to the plan.  [name] Rocheicholas Paul Las PalomasWendling, DO 03/09/16 10:45 AM

## 2016-03-23 ENCOUNTER — Telehealth: Payer: Self-pay | Admitting: Family Medicine

## 2016-03-23 DIAGNOSIS — E041 Nontoxic single thyroid nodule: Secondary | ICD-10-CM

## 2016-03-23 NOTE — Telephone Encounter (Signed)
Caller name: [name] Relationship to patient: self Can be reached: [phone]  Reason for call: pt called requesting order be entered for a thyroid gland US. She said she normally has it done yearly with her mammogram but forgot to mention it. [facility] [place] and notify her when order is sent in.

## 2016-03-23 NOTE — Telephone Encounter (Signed)
Per patient she had not seen in a long time. US has been placed for [facility] per patient request.     KP

## 2016-03-23 NOTE — Telephone Encounter (Addendum)
Relation to ZO:XWRUpt:self Call back [phone]   Reason for call:  Patient requesting US SOFT TISSUE HEAD & NECK [facility]

## 2016-03-23 NOTE — Telephone Encounter (Signed)
Order updated.    KP

## 2016-03-23 NOTE — Telephone Encounter (Signed)
It does not look like it has been done since 2012 and Dr Talmage NapBalan did it---   If not seeing her anymore ok to order US soft tissue neck (thyroid)---

## 2016-03-23 NOTE — Telephone Encounter (Signed)
Please advise      KP 

## 2016-03-23 NOTE — Addendum Note (Signed)
Addended by: Arnette NorrisPAYNE, Sennie Borden P on: 03/23/2016 03:32 PM   Modules accepted: Orders

## 2016-03-26 ENCOUNTER — Ambulatory Visit (INDEPENDENT_AMBULATORY_CARE_PROVIDER_SITE_OTHER): Payer: Commercial Managed Care - HMO

## 2016-03-26 DIAGNOSIS — E041 Nontoxic single thyroid nodule: Secondary | ICD-10-CM | POA: Diagnosis not present

## 2016-03-27 ENCOUNTER — Ambulatory Visit (HOSPITAL_BASED_OUTPATIENT_CLINIC_OR_DEPARTMENT_OTHER): Payer: Commercial Managed Care - HMO

## 2016-07-02 ENCOUNTER — Ambulatory Visit (INDEPENDENT_AMBULATORY_CARE_PROVIDER_SITE_OTHER): Payer: Commercial Managed Care - HMO | Admitting: Family Medicine

## 2016-07-02 ENCOUNTER — Encounter: Payer: Self-pay | Admitting: Family Medicine

## 2016-07-02 VITALS — BP 162/91 | HR 65 | Temp 98.3°F | Wt 181.0 lb

## 2016-07-02 DIAGNOSIS — F411 Generalized anxiety disorder: Secondary | ICD-10-CM

## 2016-07-02 DIAGNOSIS — E785 Hyperlipidemia, unspecified: Secondary | ICD-10-CM | POA: Diagnosis not present

## 2016-07-02 DIAGNOSIS — I10 Essential (primary) hypertension: Secondary | ICD-10-CM

## 2016-07-02 DIAGNOSIS — L409 Psoriasis, unspecified: Secondary | ICD-10-CM | POA: Diagnosis not present

## 2016-07-02 LAB — COMPREHENSIVE METABOLIC PANEL
ALBUMIN: 4.4 g/dL (ref 3.5–5.2)
ALT: 18 U/L (ref 0–35)
AST: 15 U/L (ref 0–37)
Alkaline Phosphatase: 50 U/L (ref 39–117)
BUN: 11 mg/dL (ref 6–23)
CALCIUM: 9.7 mg/dL (ref 8.4–10.5)
CHLORIDE: 109 meq/L (ref 96–112)
CO2: 28 mEq/L (ref 19–32)
CREATININE: 0.86 mg/dL (ref 0.40–1.20)
GFR: 74.63 mL/min (ref >60.00)
Glucose, Bld: 87 mg/dL (ref 70–99)
Potassium: 4 mEq/L (ref 3.5–5.1)
Sodium: 140 mEq/L (ref 135–145)
Total Bilirubin: 0.7 mg/dL (ref 0.2–1.2)
Total Protein: 7.1 g/dL (ref 6.0–8.3)

## 2016-07-02 LAB — LIPID PANEL
CHOLESTEROL: 224 mg/dL — AB (ref 0–200)
HDL: 49.6 mg/dL (ref >39.00)
LDL CALC: 160 mg/dL — AB (ref 0–99)
NonHDL: 173.96
TRIGLYCERIDES: 70 mg/dL (ref 0.0–149.0)
Total CHOL/HDL Ratio: 5
VLDL: 14 mg/dL (ref 0.0–40.0)

## 2016-07-02 MED ORDER — ALPRAZOLAM 0.25 MG PO TABS: 0.2500 mg | ORAL_TABLET | Freq: Three times a day (TID) | ORAL | 2 refills | Status: DC | PRN

## 2016-07-02 MED ORDER — LISINOPRIL 10 MG PO TABS: 10.0000 mg | ORAL_TABLET | Freq: Every day | ORAL | 2 refills | Status: DC

## 2016-07-02 NOTE — Patient Instructions (Signed)
Hypertension Hypertension, commonly called high blood pressure, is when the force of blood pumping through your arteries is too strong. Your arteries are the blood vessels that carry blood from your heart throughout your body. A blood pressure reading consists of a higher number over a lower number, such as 110/72. The higher number (systolic) is the pressure inside your arteries when your heart pumps. The lower number (diastolic) is the pressure inside your arteries when your heart relaxes. Ideally you want your blood pressure below 120/80. Hypertension forces your heart to work harder to pump blood. Your arteries may become narrow or stiff. Having untreated or uncontrolled hypertension can cause heart attack, stroke, kidney disease, and other problems. What increases the risk? Some risk factors for high blood pressure are controllable. Others are not. Risk factors you cannot control include:  Race. You may be at higher risk if you are African American.  Age. Risk increases with age.  Gender. Men are at higher risk than women before age 45 years. After age 65, women are at higher risk than men. Risk factors you can control include:  Not getting enough exercise or physical activity.  Being overweight.  Getting too much fat, sugar, calories, or salt in your diet.  Drinking too much alcohol. What are the signs or symptoms? Hypertension does not usually cause signs or symptoms. Extremely high blood pressure (hypertensive crisis) may cause headache, anxiety, shortness of breath, and nosebleed. How is this diagnosed? To check if you have hypertension, your health care provider will measure your blood pressure while you are seated, with your arm held at the level of your heart. It should be measured at least twice using the same arm. Certain conditions can cause a difference in blood pressure between your right and left arms. A blood pressure reading that is higher than normal on one occasion does  not mean that you need treatment. If it is not clear whether you have high blood pressure, you may be asked to return on a different day to have your blood pressure checked again. Or, you may be asked to monitor your blood pressure at home for 1 or more weeks. How is this treated? Treating high blood pressure includes making lifestyle changes and possibly taking medicine. Living a healthy lifestyle can help lower high blood pressure. You may need to change some of your habits. Lifestyle changes may include:  Following the DASH diet. This diet is high in fruits, vegetables, and whole grains. It is low in salt, red meat, and added sugars.  Keep your sodium intake below 2,300 mg per day.  Getting at least 30-45 minutes of aerobic exercise at least 4 times per week.  Losing weight if necessary.  Not smoking.  Limiting alcoholic beverages.  Learning ways to reduce stress. Your health care provider may prescribe medicine if lifestyle changes are not enough to get your blood pressure under control, and if one of the following is true:  You are 18-59 years of age and your systolic blood pressure is above 140.  You are 60 years of age or older, and your systolic blood pressure is above 150.  Your diastolic blood pressure is above 90.  You have diabetes, and your systolic blood pressure is over 140 or your diastolic blood pressure is over 90.  You have kidney disease and your blood pressure is above 140/90.  You have heart disease and your blood pressure is above 140/90. Your personal target blood pressure may vary depending on your medical   conditions, your age, and other factors. Follow these instructions at home:  Have your blood pressure rechecked as directed by your health care provider.  Take medicines only as directed by your health care provider. Follow the directions carefully. Blood pressure medicines must be taken as prescribed. The medicine does not work as well when you skip  doses. Skipping doses also puts you at risk for problems.  Do not smoke.  Monitor your blood pressure at home as directed by your health care provider. Contact a health care provider if:  You think you are having a reaction to medicines taken.  You have recurrent headaches or feel dizzy.  You have swelling in your ankles.  You have trouble with your vision. Get help right away if:  You develop a severe headache or confusion.  You have unusual weakness, numbness, or feel faint.  You have severe chest or abdominal pain.  You vomit repeatedly.  You have trouble breathing. This information is not intended to replace advice given to you by your health care provider. Make sure you discuss any questions you have with your health care provider. Document Released: 05/18/2005 Document Revised: 10/24/2015 Document Reviewed: 03/10/2013 Elsevier Interactive Patient Education  2017 Elsevier Inc.  

## 2016-07-02 NOTE — Progress Notes (Signed)
Patient ID: [name] BaconHeidi Hornsby, female   DOB: 1965-12-20, 51 y.o.   MRN: [id number]

## 2016-07-02 NOTE — Progress Notes (Signed)
Pre visit review using our clinic review tool, if applicable. No additional management support is needed unless otherwise documented below in the visit note. 

## 2016-07-02 NOTE — Progress Notes (Signed)
Subjective:    Patient ID: [name] BaconHeidi Ramos, female    DOB: Aug 02, 1965, 51 y.o.   MRN: [id number]  Chief Complaint  Patient presents with  . Follow-up  . Skin Concern    HPI Patient is in today for a follow up. Patient has a complaint of a rash (both elbows, knee, back, both ears). Recently saw a Dermatologist in December 2017. Also following up on HTN, GERD, eczema, hyperlipidemia. Home bp readings 160/91-128/72 No chest pain , palpitations or sob.    Past Medical History:  Diagnosis Date  . Alcohol abuse    recovering alcoholic  . Allergic urticaria   . Anxiety   . Cholecystitis, acute   . Congenital anomaly of neck   . Depression   . Dyslipidemia    Followed by PCP  . Gastroparesis   . GERD (gastroesophageal reflux disease)   . Hematuria   . Hiatal hernia    with reflux  . Hypertension    stress test 11/2010 normal  . Kidney stone   . Obesity   . PVC's (premature ventricular contractions)    Controlled on beta blocker therapy;  Echo 9/10: EF 55-60%, grade 1 diastolic dysfunction, mild MR, mild LAE  . Thyroid nodule    hx  . Tinea corporis   . Unspecified contraceptive management   . Urinary tract infection     Past Surgical History:  Procedure Laterality Date  . leep surgery     mid 20's  . TONSILLECTOMY      Family History  Problem Relation Age of Onset  . Diabetes Mother   . Liver disease Mother   . Irritable bowel syndrome Mother   . Colon polyps Mother   . Breast cancer Mother   . Thyroid disease Mother   . Lung cancer Maternal Uncle   . Lung cancer Maternal Grandmother   . Heart attack Maternal Grandmother 48  . Diabetes Maternal Grandmother   . Lung cancer Paternal Grandmother   . Coronary artery disease Other     female 1st degree relative 49 yo  . Hypertension Other     Social History   Social History  . Marital status: Single    Spouse name: N/A  . Number of children: N/A  . Years of education: N/A   Occupational History  .  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 Smoking status: Current Every Day Smoker    Packs/day: 0.50    Years: 24.00    Types: Cigarettes    Last attempt to quit: 07/02/2008  . Smokeless tobacco: Never Used  . Alcohol use 0.0 oz/week     Comment: drinks daily  . Drug use: No  . Sexual activity: Not Currently    Partners: Male   Other Topics Concern  . Not on file   Social History Narrative   Exercising---walking dog daily    Outpatient Medications Prior to Visit  Medication Sig Dispense Refill  . aspirin 81 MG tablet Take 81 mg by mouth daily.    . clobetasol ointment (TEMOVATE) 0.05 % Apply to affected area every night for 4 weeks, then every other day for 4 weeks and then twice a week 60 g 5  . cyanocobalamin (,VITAMIN B-12,) 1000 MCG/ML injection Inject 1 mL (1,000 mcg total) into the muscle once. 4 mL 0  . medroxyPROGESTERone (DEPO-PROVERA) 150 MG/ML injection AS DIRECTED EVERY 3 MONTHS 1 mL 4  . omeprazole (PRILOSEC) 40 MG capsule Take 1 capsule (  40 mg total) by mouth daily. 90 capsule 3  . ALPRAZolam (XANAX) 0.25 MG tablet Take 1 tablet (0.25 mg total) by mouth 3 (three) times daily as needed for anxiety. 30 tablet 0  . metoprolol succinate (TOPROL-XL) 100 MG 24 hr tablet Take 1 tablet (100 mg total) by mouth daily. Take with or immediately following a meal. 90 tablet 3  . acetic acid (VOSOL) 2 % otic solution Place 4 drops into both ears 3 (three) times daily. (Patient not taking: Reported on 07/02/2016) 15 mL 0  . dicyclomine (BENTYL) 10 MG capsule Take 1 capsule (10 mg total) by mouth 4 (four) times daily -  before meals and at bedtime. (Patient not taking: Reported on 07/02/2016) 30 capsule 0  . ondansetron (ZOFRAN) 4 MG tablet Take 1 tablet (4 mg total) by mouth every 8 (eight) hours as needed for nausea or vomiting. (Patient not taking: Reported on 07/02/2016) 20 tablet 0   No facility-administered medications prior to visit.     Allergies  Allergen  Reactions  . Piroxicam     REACTION: hives    Review of Systems  Constitutional: Negative for fever and malaise/fatigue.  HENT: Negative for congestion.   Eyes: Negative for blurred vision.  Respiratory: Negative for cough and shortness of breath.   Cardiovascular: Negative for chest pain, palpitations and leg swelling.  Gastrointestinal: Negative for vomiting.  Musculoskeletal: Negative for back pain.  Skin: Positive for rash.       Suspects Psoriasis.  Neurological: Negative for loss of consciousness and headaches.       Objective:    Physical Exam  Constitutional: She is oriented to person, place, and time. She appears well-developed and well-nourished. No distress.  HENT:  Head: Normocephalic and atraumatic.  Eyes: Conjunctivae are normal.  Neck: Normal range of motion. No thyromegaly present.  Cardiovascular: Normal rate and regular rhythm.   Pulmonary/Chest: Effort normal and breath sounds normal. She has no wheezes.  Abdominal: Soft. Bowel sounds are normal. There is no tenderness.  Musculoskeletal: She exhibits no edema or deformity.  Neurological: She is alert and oriented to person, place, and time.  Skin: Skin is warm and dry. She is not diaphoretic.  Psychiatric: She has a normal mood and affect.    BP (!) 162/91 (BP Location: Right Arm, Patient Position: Sitting, Cuff Size: Normal)   Pulse 65   Temp 98.3 F (36.8 C) (Oral)   Wt 181 lb (82.1 kg)   SpO2 100% Comment: RA  BMI 30.12 kg/m  Wt Readings from Last 3 Encounters:  07/02/16 181 lb (82.1 kg)  03/09/16 172 lb 12.8 oz (78.4 kg)  02/24/16 175 lb (79.4 kg)     Lab Results  Component Value Date   WBC 9.7 12/30/2015   HGB 14.6 12/30/2015   HCT 43.2 12/30/2015   PLT 234.0 12/30/2015   GLUCOSE 87 07/02/2016   CHOL 224 (H) 07/02/2016   TRIG 70.0 07/02/2016   HDL 49.60 07/02/2016   LDLDIRECT 148.1 01/20/2008   LDLCALC 160 (H) 07/02/2016   ALT 18 07/02/2016   AST 15 07/02/2016   NA 140  07/02/2016   K 4.0 07/02/2016   CL 109 07/02/2016   CREATININE 0.86 07/02/2016   BUN 11 07/02/2016   CO2 28 07/02/2016   TSH 1.14 12/30/2015    Lab Results  Component Value Date   TSH 1.14 12/30/2015   Lab Results  Component Value Date   WBC 9.7 12/30/2015   HGB 14.6 12/30/2015  HCT 43.2 12/30/2015   MCV 93.8 12/30/2015   PLT 234.0 12/30/2015   Lab Results  Component Value Date   NA 140 07/02/2016   K 4.0 07/02/2016   CO2 28 07/02/2016   GLUCOSE 87 07/02/2016   BUN 11 07/02/2016   CREATININE 0.86 07/02/2016   BILITOT 0.7 07/02/2016   ALKPHOS 50 07/02/2016   AST 15 07/02/2016   ALT 18 07/02/2016   PROT 7.1 07/02/2016   ALBUMIN 4.4 07/02/2016   CALCIUM 9.7 07/02/2016   ANIONGAP 8 08/30/2015   GFR 74.63 07/02/2016   Lab Results  Component Value Date   CHOL 224 (H) 07/02/2016   Lab Results  Component Value Date   HDL 49.60 07/02/2016   Lab Results  Component Value Date   LDLCALC 160 (H) 07/02/2016   Lab Results  Component Value Date   TRIG 70.0 07/02/2016   Lab Results  Component Value Date   CHOLHDL 5 07/02/2016   No results found for: HGBA1C     Assessment & Plan:   Problem List Items Addressed This Visit      Unprioritized   Essential hypertension    con't metoprolol Add lisinopril rto 2-3 weeks to recheck bp      Relevant Medications   lisinopril (PRINIVIL,ZESTRIL) 10 MG tablet   Other Relevant Orders   Comprehensive metabolic panel (Completed)   Lipid panel (Completed)    Other Visit Diagnoses    Psoriasis    -  Primary   Relevant Orders   Ambulatory referral to Dermatology   Generalized anxiety disorder       Relevant Medications   ALPRAZolam (XANAX) 0.25 MG tablet   Hyperlipidemia LDL goal <100       Relevant Medications   lisinopril (PRINIVIL,ZESTRIL) 10 MG tablet   Other Relevant Orders   Comprehensive metabolic panel (Completed)   Lipid panel (Completed)      I have discontinued [name] Ramos's acetic acid,  ondansetron, and dicyclomine. I am also having her start on lisinopril. Additionally, I am having her maintain her cyanocobalamin, medroxyPROGESTERone, aspirin, omeprazole, clobetasol ointment, metoprolol succinate, and ALPRAZolam.  Meds ordered this encounter  Medications  . ALPRAZolam (XANAX) 0.25 MG tablet    Sig: Take 1 tablet (0.25 mg total) by mouth 3 (three) times daily as needed for anxiety.    Dispense:  30 tablet    Refill:  2  . lisinopril (PRINIVIL,ZESTRIL) 10 MG tablet    Sig: Take 1 tablet (10 mg total) by mouth daily.    Dispense:  30 tablet    Refill:  2     Donato Schultz, DO

## 2016-07-03 ENCOUNTER — Encounter: Payer: Self-pay | Admitting: Family Medicine

## 2016-07-04 NOTE — Assessment & Plan Note (Signed)
con't metoprolol Add lisinopril rto 2-3 weeks to recheck bp

## 2016-07-06 ENCOUNTER — Other Ambulatory Visit: Payer: Self-pay | Admitting: Family Medicine

## 2016-07-06 DIAGNOSIS — E785 Hyperlipidemia, unspecified: Secondary | ICD-10-CM

## 2016-07-13 ENCOUNTER — Telehealth: Payer: Self-pay | Admitting: Family Medicine

## 2016-07-13 NOTE — Telephone Encounter (Signed)
°  Relation to UJ:WJXBpt:self Call back number:[phone]  Reason for call:  Patient states on Sunday she took lisinopril (PRINIVIL,ZESTRIL) 10 MG tablet and it caused her bottom lip to tingle and as per patient PCP advised to take bendarly which caused the tingling sensation to stop, patient in need of clinical advise regarding medication

## 2016-07-14 MED ORDER — AMLODIPINE BESYLATE 5 MG PO TABS: 5.0000 mg | ORAL_TABLET | Freq: Every day | ORAL | 2 refills | Status: DC

## 2016-07-14 NOTE — Telephone Encounter (Signed)
Stop med--- norvasc 5 mg 1 po qd #30  Ov here  Put Ace inh on all list

## 2016-07-14 NOTE — Telephone Encounter (Signed)
Patient informed of PCP instructions. Sent in new medication /schedule followup appt. Put ace inh on allergy list.

## 2016-08-20 ENCOUNTER — Encounter: Payer: Self-pay | Admitting: Nurse Practitioner

## 2016-08-20 ENCOUNTER — Encounter: Payer: Self-pay | Admitting: Family Medicine

## 2016-08-20 ENCOUNTER — Ambulatory Visit (INDEPENDENT_AMBULATORY_CARE_PROVIDER_SITE_OTHER): Payer: Commercial Managed Care - HMO | Admitting: Family Medicine

## 2016-08-20 VITALS — BP 126/78 | HR 66 | Temp 98.6°F | Resp 16 | Ht 65.0 in | Wt 184.2 lb

## 2016-08-20 DIAGNOSIS — K219 Gastro-esophageal reflux disease without esophagitis: Secondary | ICD-10-CM

## 2016-08-20 DIAGNOSIS — I10 Essential (primary) hypertension: Secondary | ICD-10-CM

## 2016-08-20 MED ORDER — CYCLOBENZAPRINE HCL 10 MG PO TABS: 10.0000 mg | ORAL_TABLET | Freq: Three times a day (TID) | ORAL | 0 refills | Status: DC | PRN

## 2016-08-20 NOTE — Progress Notes (Signed)
Patient ID: [name] BaconHeidi Ramos, female   DOB: Oct 27, 1965, 51 y.o.   MRN: [id number]     Subjective:  I acted as a Neurosurgeonscribe for Dr. Zola Ramos.  [name] Ramos, CMA   Patient ID: [name] Ramos, female    DOB: Oct 27, 1965, 51 y.o.   MRN: [id number]  Chief Complaint  Patient presents with  . Hypertension    HPI  Patient is in today for follow up blood pressure.  Blood pressure at home has been running high.   Checked it on Monday and 157/90. Also having neck pain.  She thinks she may have slept wrong on Monday night and woke up Tuesday morning with neck pain.  Has tried ibuprofen for the pain which does not help.   Patient Care Team: [name] SchultzYvonne R Lowne Chase, DO as PCP - General [name] Hazelhristopher D McAlhany, MD as Consulting Physician (Cardiology)   Past Medical History:  Diagnosis Date  . Alcohol abuse    recovering alcoholic  . Allergic urticaria   . Anxiety   . Cholecystitis, acute   . Congenital anomaly of neck   . Depression   . Dyslipidemia    Followed by PCP  . Gastroparesis   . GERD (gastroesophageal reflux disease)   . Hematuria   . Hiatal hernia    with reflux  . Hypertension    stress test 11/2010 normal  . Kidney stone   . Obesity   . PVC's (premature ventricular contractions)    Controlled on beta blocker therapy;  Echo 9/10: EF 55-60%, grade 1 diastolic dysfunction, mild MR, mild LAE  . Thyroid nodule    hx  . Tinea corporis   . Unspecified contraceptive management   . Urinary tract infection     Past Surgical History:  Procedure Laterality Date  . leep surgery     mid 20's  . TONSILLECTOMY      Family History  Problem Relation Age of Onset  . Diabetes Mother   . Liver disease Mother   . Irritable bowel syndrome Mother   . Colon polyps Mother   . Breast cancer Mother   . Thyroid disease Mother   . Lung cancer Maternal Uncle   . Lung cancer Maternal Grandmother   . Heart attack Maternal Grandmother 48  . Diabetes Maternal Grandmother   . Lung cancer Paternal  Grandmother   . Coronary artery disease Other     female 1st degree relative 49 yo  . Hypertension Other     Social History   Social History  . Marital status: Single    Spouse name: N/A  . Number of children: N/A  . Years of education: N/A   Occupational History  . 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 Smoking status: Current Every Day Smoker    Packs/day: 0.50    Years: 24.00    Types: Cigarettes    Last attempt to quit: 07/02/2008  . Smokeless tobacco: Never Used  . Alcohol use 0.0 oz/week     Comment: drinks daily  . Drug use: No  . Sexual activity: Not Currently    Partners: Male   Other Topics Concern  . Not on file   Social History Narrative   Exercising---walking dog daily    Outpatient Medications Prior to Visit  Medication Sig Dispense Refill  . ALPRAZolam (XANAX) 0.25 MG tablet Take 1 tablet (0.25 mg total) by mouth 3 (three) times daily as needed for anxiety. 30 tablet 2  . amLODipine (NORVASC) 5  MG tablet Take 1 tablet (5 mg total) by mouth daily. 30 tablet 2  . aspirin 81 MG tablet Take 81 mg by mouth daily.    . clobetasol ointment (TEMOVATE) 0.05 % Apply to affected area every night for 4 weeks, then every other day for 4 weeks and then twice a week 60 g 5  . cyanocobalamin (,VITAMIN B-12,) 1000 MCG/ML injection Inject 1 mL (1,000 mcg total) into the muscle once. 4 mL 0  . medroxyPROGESTERone (DEPO-PROVERA) 150 MG/ML injection AS DIRECTED EVERY 3 MONTHS 1 mL 4  . metoprolol succinate (TOPROL-XL) 100 MG 24 hr tablet Take 1 tablet (100 mg total) by mouth daily. Take with or immediately following a meal. 90 tablet 3  . omeprazole (PRILOSEC) 40 MG capsule Take 1 capsule (40 mg total) by mouth daily. 90 capsule 3   No facility-administered medications prior to visit.     Allergies  Allergen Reactions  . Ace Inhibitors Other (See Comments)    Numbness, tingling of lips.  . Piroxicam     REACTION: hives    Review of Systems    Constitutional: Negative for fever and malaise/fatigue.  HENT: Negative for congestion.   Eyes: Negative for blurred vision.  Respiratory: Negative for cough and shortness of breath.   Cardiovascular: Negative for chest pain, palpitations and leg swelling.  Gastrointestinal: Negative for vomiting.  Musculoskeletal: Negative for back pain.  Skin: Negative for rash.  Neurological: Negative for loss of consciousness and headaches.       Objective:    Physical Exam  Constitutional: She is oriented to person, place, and time. She appears well-developed and well-nourished. No distress.  HENT:  Head: Normocephalic and atraumatic.  Eyes: Conjunctivae are normal.  Neck: Normal range of motion. No thyromegaly present.  Cardiovascular: Normal rate and regular rhythm.   Pulmonary/Chest: Effort normal and breath sounds normal. She has no wheezes.  Abdominal: Soft. Bowel sounds are normal. There is no tenderness.  Musculoskeletal: Normal range of motion. She exhibits no edema or deformity.  Neurological: She is alert and oriented to person, place, and time.  Skin: Skin is warm and dry. She is not diaphoretic.  Psychiatric: She has a normal mood and affect.    BP 126/78   Pulse 66   Temp 98.6 F (37 C) (Oral)   Resp 16   Ht 5\' 5"  (1.651 m)   Wt 184 lb 3.2 oz (83.6 kg)   SpO2 99%   BMI 30.65 kg/m  Wt Readings from Last 3 Encounters:  08/20/16 184 lb 3.2 oz (83.6 kg)  07/02/16 181 lb (82.1 kg)  03/09/16 172 lb 12.8 oz (78.4 kg)   BP Readings from Last 3 Encounters:  08/20/16 126/78  07/02/16 (!) 162/91  03/09/16 138/74     Immunization History  Administered Date(s) Administered  . Td 01/21/2007    Health Maintenance  Topic Date Due  . INFLUENZA VACCINE  03/02/2017 (Originally 12/31/2015)  . HIV Screening  08/28/2035 (Originally 10/16/1982)  . TETANUS/TDAP  01/20/2017  . PAP SMEAR  11/28/2017    Lab Results  Component Value Date   WBC 9.7 12/30/2015   HGB 14.6 12/30/2015    HCT 43.2 12/30/2015   PLT 234.0 12/30/2015   GLUCOSE 87 07/02/2016   CHOL 224 (H) 07/02/2016   TRIG 70.0 07/02/2016   HDL 49.60 07/02/2016   LDLDIRECT 148.1 01/20/2008   LDLCALC 160 (H) 07/02/2016   ALT 18 07/02/2016   AST 15 07/02/2016   NA 140 07/02/2016  K 4.0 07/02/2016   CL 109 07/02/2016   CREATININE 0.86 07/02/2016   BUN 11 07/02/2016   CO2 28 07/02/2016   TSH 1.14 12/30/2015    Lab Results  Component Value Date   TSH 1.14 12/30/2015   Lab Results  Component Value Date   WBC 9.7 12/30/2015   HGB 14.6 12/30/2015   HCT 43.2 12/30/2015   MCV 93.8 12/30/2015   PLT 234.0 12/30/2015   Lab Results  Component Value Date   NA 140 07/02/2016   K 4.0 07/02/2016   CO2 28 07/02/2016   GLUCOSE 87 07/02/2016   BUN 11 07/02/2016   CREATININE 0.86 07/02/2016   BILITOT 0.7 07/02/2016   ALKPHOS 50 07/02/2016   AST 15 07/02/2016   ALT 18 07/02/2016   PROT 7.1 07/02/2016   ALBUMIN 4.4 07/02/2016   CALCIUM 9.7 07/02/2016   ANIONGAP 8 08/30/2015   GFR 74.63 07/02/2016   Lab Results  Component Value Date   CHOL 224 (H) 07/02/2016   Lab Results  Component Value Date   HDL 49.60 07/02/2016   Lab Results  Component Value Date   LDLCALC 160 (H) 07/02/2016   Lab Results  Component Value Date   TRIG 70.0 07/02/2016   Lab Results  Component Value Date   CHOLHDL 5 07/02/2016   No results found for: HGBA1C       Assessment & Plan:   Problem List Items Addressed This Visit      Unprioritized   GERD - Primary   Relevant Orders   Ambulatory referral to Gastroenterology   Essential hypertension    Well controlled, no changes to meds. Encouraged heart healthy diet such as the DASH diet and exercise as tolerated.  con't norvasc and metoprolol rto 3 months         I am having Ms. Brostrom start on cyclobenzaprine. I am also having her maintain her cyanocobalamin, medroxyPROGESTERone, aspirin, omeprazole, clobetasol ointment, metoprolol succinate,  ALPRAZolam, and amLODipine.  Meds ordered this encounter  Medications  . cyclobenzaprine (FLEXERIL) 10 MG tablet    Sig: Take 1 tablet (10 mg total) by mouth 3 (three) times daily as needed for muscle spasms.    Dispense:  30 tablet    Refill:  0    CMA served as scribe during this visit. History, Physical and Plan performed by medical provider. Documentation and orders reviewed and attested to.  [name] Schultz, DO

## 2016-08-20 NOTE — Progress Notes (Signed)
Pre visit review using our clinic review tool, if applicable. No additional management support is needed unless otherwise documented below in the visit note. 

## 2016-08-20 NOTE — Patient Instructions (Addendum)
[facility]  [phone].   Hypertension Hypertension, commonly called high blood pressure, is when the force of blood pumping through the arteries is too strong. The arteries are the blood vessels that carry blood from the heart throughout the body. Hypertension forces the heart to work harder to pump blood and may cause arteries to become narrow or stiff. Having untreated or uncontrolled hypertension can cause heart attacks, strokes, kidney disease, and other problems. A blood pressure reading consists of a higher number over a lower number. Ideally, your blood pressure should be below 120/80. The first ("top") number is called the systolic pressure. It is a measure of the pressure in your arteries as your heart beats. The second ("bottom") number is called the diastolic pressure. It is a measure of the pressure in your arteries as the heart relaxes. What are the causes? The cause of this condition is not known. What increases the risk? Some risk factors for high blood pressure are under your control. Others are not. Factors you can change   Smoking.  Having type 2 diabetes mellitus, high cholesterol, or both.  Not getting enough exercise or physical activity.  Being overweight.  Having too much fat, sugar, calories, or salt (sodium) in your diet.  Drinking too much alcohol. Factors that are difficult or impossible to change   Having chronic kidney disease.  Having a family history of high blood pressure.  Age. Risk increases with age.  Race. You may be at higher risk if you are African-American.  Gender. Men are at higher risk than women before age 49. After age 49, women are at higher risk than men.  Having obstructive sleep apnea.  Stress. What are the signs or symptoms? Extremely high blood pressure (hypertensive crisis) may cause:  Headache.  Anxiety.  Shortness of breath.  Nosebleed.  Nausea and vomiting.  Severe chest pain.  Jerky movements you  cannot control (seizures). How is this diagnosed? This condition is diagnosed by measuring your blood pressure while you are seated, with your arm resting on a surface. The cuff of the blood pressure monitor will be placed directly against the skin of your upper arm at the level of your heart. It should be measured at least twice using the same arm. Certain conditions can cause a difference in blood pressure between your right and left arms. Certain factors can cause blood pressure readings to be lower or higher than normal (elevated) for a short period of time:  When your blood pressure is higher when you are in a health care provider's office than when you are at home, this is called white coat hypertension. Most people with this condition do not need medicines.  When your blood pressure is higher at home than when you are in a health care provider's office, this is called masked hypertension. Most people with this condition may need medicines to control blood pressure. If you have a high blood pressure reading during one visit or you have normal blood pressure with other risk factors:  You may be asked to return on a different day to have your blood pressure checked again.  You may be asked to monitor your blood pressure at home for 1 week or longer. If you are diagnosed with hypertension, you may have other blood or imaging tests to help your health care provider understand your overall risk for other conditions. How is this treated? This condition is treated by making healthy lifestyle changes, such as eating healthy foods, exercising more, and  reducing your alcohol intake. Your health care provider may prescribe medicine if lifestyle changes are not enough to get your blood pressure under control, and if:  Your systolic blood pressure is above 130.  Your diastolic blood pressure is above 80. Your personal target blood pressure may vary depending on your medical conditions, your age, and  other factors. Follow these instructions at home: Eating and drinking   Eat a diet that is high in fiber and potassium, and low in sodium, added sugar, and fat. An example eating plan is called the DASH (Dietary Approaches to Stop Hypertension) diet. To eat this way:  Eat plenty of fresh fruits and vegetables. Try to fill half of your plate at each meal with fruits and vegetables.  Eat whole grains, such as whole wheat pasta, brown rice, or whole grain bread. Fill about one quarter of your plate with whole grains.  Eat or drink low-fat dairy products, such as skim milk or low-fat yogurt.  Avoid fatty cuts of meat, processed or cured meats, and poultry with skin. Fill about one quarter of your plate with lean proteins, such as fish, chicken without skin, beans, eggs, and tofu.  Avoid premade and processed foods. These tend to be higher in sodium, added sugar, and fat.  Reduce your daily sodium intake. Most people with hypertension should eat less than 1,500 mg of sodium a day.  Limit alcohol intake to no more than 1 drink a day for nonpregnant women and 2 drinks a day for men. One drink equals 12 oz of beer, 5 oz of wine, or 1 oz of hard liquor. Lifestyle   Work with your health care provider to maintain a healthy body weight or to lose weight. Ask what an ideal weight is for you.  Get at least 30 minutes of exercise that causes your heart to beat faster (aerobic exercise) most days of the week. Activities may include walking, swimming, or biking.  Include exercise to strengthen your muscles (resistance exercise), such as pilates or lifting weights, as part of your weekly exercise routine. Try to do these types of exercises for 30 minutes at least 3 days a week.  Do not use any products that contain nicotine or tobacco, such as cigarettes and e-cigarettes. If you need help quitting, ask your health care provider.  Monitor your blood pressure at home as told by your health care  provider.  Keep all follow-up visits as told by your health care provider. This is important. Medicines   Take over-the-counter and prescription medicines only as told by your health care provider. Follow directions carefully. Blood pressure medicines must be taken as prescribed.  Do not skip doses of blood pressure medicine. Doing this puts you at risk for problems and can make the medicine less effective.  Ask your health care provider about side effects or reactions to medicines that you should watch for. Contact a health care provider if:  You think you are having a reaction to a medicine you are taking.  You have headaches that keep coming back (recurring).  You feel dizzy.  You have swelling in your ankles.  You have trouble with your vision. Get help right away if:  You develop a severe headache or confusion.  You have unusual weakness or numbness.  You feel faint.  You have severe pain in your chest or abdomen.  You vomit repeatedly.  You have trouble breathing. Summary  Hypertension is when the force of blood pumping through your arteries is  too strong. If this condition is not controlled, it may put you at risk for serious complications.  Your personal target blood pressure may vary depending on your medical conditions, your age, and other factors. For most people, a normal blood pressure is less than 120/80.  Hypertension is treated with lifestyle changes, medicines, or a combination of both. Lifestyle changes include weight loss, eating a healthy, low-sodium diet, exercising more, and limiting alcohol. This information is not intended to replace advice given to you by your health care provider. Make sure you discuss any questions you have with your health care provider. Document Released: 05/18/2005 Document Revised: 04/15/2016 Document Reviewed: 04/15/2016 Elsevier Interactive Patient Education  2017 [employer].

## 2016-08-20 NOTE — Assessment & Plan Note (Signed)
Well controlled, no changes to meds. Encouraged heart healthy diet such as the DASH diet and exercise as tolerated.  con't norvasc and metoprolol rto 3 months

## 2016-09-04 ENCOUNTER — Ambulatory Visit: Payer: Commercial Managed Care - HMO | Admitting: Nurse Practitioner

## 2016-09-10 ENCOUNTER — Ambulatory Visit (INDEPENDENT_AMBULATORY_CARE_PROVIDER_SITE_OTHER): Payer: Commercial Managed Care - HMO | Admitting: Nurse Practitioner

## 2016-09-10 ENCOUNTER — Encounter: Payer: Self-pay | Admitting: Nurse Practitioner

## 2016-09-10 VITALS — BP 136/80 | HR 74 | Ht 65.0 in | Wt 186.0 lb

## 2016-09-10 DIAGNOSIS — R1310 Dysphagia, unspecified: Secondary | ICD-10-CM | POA: Diagnosis not present

## 2016-09-10 DIAGNOSIS — K219 Gastro-esophageal reflux disease without esophagitis: Secondary | ICD-10-CM | POA: Diagnosis not present

## 2016-09-10 NOTE — Patient Instructions (Addendum)
You have been given a separate informational sheet regarding your tobacco use, the importance of quitting and local resources to help you quit.  If you are age 49 or older, your body mass index should be between 23-30. Your Body mass index is 30.95 kg/m. If this is out of the aforementioned range listed, please consider follow up with your Primary Care Provider.  If you are age 68 or younger, your body mass index should be between 19-25. Your Body mass index is 30.95 kg/m. If this is out of the aformentioned range listed, please consider follow up with your Primary Care Provider.   You have been scheduled for an endoscopy. Please follow written instructions given to you at your visit today. If you use inhalers (even only as needed), please bring them with you on the day of your procedure. Your physician has requested that you go to www.startemmi.com and enter the access code given to you at your visit today. This web site gives a general overview about your procedure. However, you should still follow specific instructions given to you by our office regarding your preparation for the procedure.    You have been scheduled for a Barium Esophogram at [facility] (1st floor of the hospital) on 09/18/16 at 1100 am. Please arrive 15 minutes prior to your appointment for registration. Make certain not to have anything to eat or drink 3 hours prior to your test. If you need to reschedule for any reason, please contact radiology at [phone] to do so. __________________________________________________________________ A barium swallow is an examination that concentrates on views of the esophagus. This tends to be a double contrast exam (barium and two liquids which, when combined, create a gas to distend the wall of the oesophagus) or single contrast (non-ionic iodine based). The study is usually tailored to your symptoms so a good history is essential. Attention is paid during the study to the  form, structure and configuration of the esophagus, looking for functional disorders (such as aspiration, dysphagia, achalasia, motility and reflux) EXAMINATION You may be asked to change into a gown, depending on the type of swallow being performed. A radiologist and radiographer will perform the procedure. The radiologist will advise you of the type of contrast selected for your procedure and direct you during the exam. You will be asked to stand, sit or lie in several different positions and to hold a small amount of fluid in your mouth before being asked to swallow while the imaging is performed .In some instances you may be asked to swallow barium coated marshmallows to assess the motility of a solid food bolus. The exam can be recorded as a digital or video fluoroscopy procedure. POST PROCEDURE It will take 1-2 days for the barium to pass through your system. To facilitate this, it is important, unless otherwise directed, to increase your fluids for the next 24-48hrs and to resume your normal diet.  This test typically takes about 30 minutes to perform. __________________________________________________________________________________  Thank you for choosing me and [facility].  Willette Cluster, NP

## 2016-09-10 NOTE — Progress Notes (Signed)
HPI:  Patient is a 51 yo female previously followed by Dr. Juanda Chance for GERD. She requests Dr. Lavon Paganini as new GI physician. She is here today for evaluation of ongoing GERD symptoms. Patient has been on a PPI for years, she frequently requires TUMs for breakthrough symptoms which she describes as a pressure sensation in her throat / chest. She doesn't have the more typical GERD symptoms such as regurgitation or heartburn.  Over the last two weeks she has had sensation that certain foods stick in right side of her throat. No odynophagia. She has a hx of PVCs and LBB and is followed by Dr. Katrinka Blazing with [facility] .  Nuclear stress in April 2017, nothing overly concerning per Cardiology. EF 45%    Past Medical History:  Diagnosis Date  . Alcohol abuse    recovering alcoholic  . Allergic urticaria   . Anxiety   . Cholecystitis, acute   . Congenital anomaly of neck   . Depression   . Dyslipidemia    Followed by PCP  . Gastroparesis   . GERD (gastroesophageal reflux disease)   . Hematuria   . Hiatal hernia    with reflux  . Hypertension    stress test 11/2010 normal  . Kidney stone   . Obesity   . PVC's (premature ventricular contractions)    Controlled on beta blocker therapy;  Echo 9/10: EF 55-60%, grade 1 diastolic dysfunction, mild MR, mild LAE  . Thyroid nodule    hx  . Tinea corporis   . Unspecified contraceptive management   . Urinary tract infection      Past Surgical History:  Procedure Laterality Date  . leep surgery     mid 20's  . TONSILLECTOMY     Family History  Problem Relation Age of Onset  . Diabetes Mother   . Liver disease Mother   . Irritable bowel syndrome Mother   . Colon polyps Mother   . Breast cancer Mother   . Thyroid disease Mother   . Lung cancer Maternal Uncle   . Lung cancer Maternal Grandmother   . Heart attack Maternal Grandmother 48  . Diabetes Maternal Grandmother   . Lung cancer Paternal Grandmother   . Coronary artery  disease Other     female 1st degree relative 49 yo  . Hypertension Other   . Colon cancer Neg Hx   . Stomach cancer Neg Hx    Social History  Substance Use Topics  . Smoking status: Current Every Day Smoker    Packs/day: 0.50    Years: 24.00    Types: Cigarettes    Last attempt to quit: 07/02/2008  . Smokeless tobacco: Never Used  . Alcohol use No   Current Outpatient Prescriptions  Medication Sig Dispense Refill  . ALPRAZolam (XANAX) 0.25 MG tablet Take 1 tablet (0.25 mg total) by mouth 3 (three) times daily as needed for anxiety. 30 tablet 2  . amLODipine (NORVASC) 5 MG tablet Take 1 tablet (5 mg total) by mouth daily. 30 tablet 2  . aspirin 81 MG tablet Take 81 mg by mouth daily.    . clobetasol ointment (TEMOVATE) 0.05 % Apply to affected area every night for 4 weeks, then every other day for 4 weeks and then twice a week 60 g 5  . cyanocobalamin (,VITAMIN B-12,) 1000 MCG/ML injection Inject 1 mL (1,000 mcg total) into the muscle once. 4 mL 0  . cyclobenzaprine (FLEXERIL) 10 MG tablet Take 1 tablet (  10 mg total) by mouth 3 (three) times daily as needed for muscle spasms. 30 tablet 0  . medroxyPROGESTERone (DEPO-PROVERA) 150 MG/ML injection AS DIRECTED EVERY 3 MONTHS 1 mL 4  . omeprazole (PRILOSEC) 40 MG capsule Take 1 capsule (40 mg total) by mouth daily. 90 capsule 3  . metoprolol succinate (TOPROL-XL) 100 MG 24 hr tablet Take 1 tablet (100 mg total) by mouth daily. Take with or immediately following a meal. 90 tablet 3   No current facility-administered medications for this visit.    Allergies  Allergen Reactions  . Ace Inhibitors Other (See Comments)    Numbness, tingling of lips.  . Piroxicam     REACTION: hives     Review of Systems: All systems reviewed and negative except where noted in HPI.    Physical Exam: BP 136/80   Pulse 74   Ht  (1.651 m)   Wt 186 lb (84.4 kg)   BMI 30.95 kg/m  Constitutional:  Well-developed, white female in no acute  distress. Psychiatric: Normal mood and affect. Behavior is normal. EENT:  Conjunctivae are normal. No scleral icterus. Neck supple. Fullness anterior neck, R> L.  Cardiovascular: Normal rate, regular rhythm.  Pulmonary/chest: Effort normal and breath sounds normal. No wheezing, rales or rhonchi. Abdominal: Soft, nondistended, nontender. Bowel sounds active throughout. There are no masses palpable. No hepatomegaly. Extremities: no edema Lymphadenopathy: No cervical adenopathy noted. Musculoskeletal: Subtle swelling in area or right sternoclavicular joint Neurological: Alert and oriented to person place and time. Skin: Skin is warm and dry. No rashes noted.   ASSESSMENT AND PLAN:  1. 51 yo female with chronic GERD diagnosed in 2009.  Chronic pressure in throat / chest which she feels is secondary to GERD. Now with sensation that certain foods sticking in right side of her throat. She takes a daily PPI but still requires Alka Seltzers at least once a week.  -etiology of new swallowing problem is unclear. Will obtain barium swallow to evaluate for any anatomical abnormalities such as esophageal diverticulum, strictures, etc... -chest and throat pressure could be a manifestation of GERD though a bit atypical. She is scheduled for barium swallow. Will schedule tentatively for EGD in case barium swallow is negative.    2. Left lower lobe thyroid nodule stable by ultrasound late October. Her neck seems full, R>L . PCP follows thyroid  3. Right chest wall abnormality - right sternoclavicular area is mildly swollen. After I palpated the area patient shared that she had not really noticed the swelling but had been having pain in the area for several days. If continue to cause pain, or if swelling increases she needs to contact PCP.   Willette Cluster, NP  09/10/2016, 2:30 PM  Cc:  Donato Schultz, *

## 2016-09-14 ENCOUNTER — Ambulatory Visit (INDEPENDENT_AMBULATORY_CARE_PROVIDER_SITE_OTHER): Payer: Commercial Managed Care - HMO | Admitting: Medical

## 2016-09-14 ENCOUNTER — Encounter: Payer: Self-pay | Admitting: Medical

## 2016-09-14 ENCOUNTER — Ambulatory Visit (HOSPITAL_BASED_OUTPATIENT_CLINIC_OR_DEPARTMENT_OTHER)
Admission: RE | Admit: 2016-09-14 | Discharge: 2016-09-14 | Disposition: A | Discharge status: Home / Self Care | Payer: Commercial Managed Care - HMO | Source: Ambulatory Visit | Attending: Medical | Admitting: Medical

## 2016-09-14 VITALS — BP 144/72 | HR 67 | Temp 98.4°F | Resp 16 | Ht 65.0 in | Wt 187.0 lb

## 2016-09-14 DIAGNOSIS — M898X1 Other specified disorders of bone, shoulder: Secondary | ICD-10-CM

## 2016-09-14 DIAGNOSIS — M25511 Pain in right shoulder: Secondary | ICD-10-CM | POA: Insufficient documentation

## 2016-09-14 DIAGNOSIS — S46819A Strain of other muscles, fascia and tendons at shoulder and upper arm level, unspecified arm, initial encounter: Secondary | ICD-10-CM | POA: Diagnosis not present

## 2016-09-14 LAB — CBC WITH DIFFERENTIAL/PLATELET
BASOS PCT: 0.6 % (ref 0.0–3.0)
Basophils Absolute: 0.1 10*3/uL (ref 0.0–0.1)
EOS ABS: 0.1 10*3/uL (ref 0.0–0.7)
EOS PCT: 1.1 % (ref 0.0–5.0)
HEMATOCRIT: 43.2 % (ref 36.0–46.0)
HEMOGLOBIN: 14.4 g/dL (ref 12.0–15.0)
Lymphocytes Relative: 22.2 % (ref 12.0–46.0)
Lymphs Abs: 1.9 10*3/uL (ref 0.7–4.0)
MCHC: 33.4 g/dL (ref 30.0–36.0)
MCV: 95 fl (ref 78.0–100.0)
MONO ABS: 0.5 10*3/uL (ref 0.1–1.0)
Monocytes Relative: 5.7 % (ref 3.0–12.0)
Neutro Abs: 6.1 10*3/uL (ref 1.4–7.7)
Neutrophils Relative %: 70.4 % (ref 43.0–77.0)
Platelets: 237 10*3/uL (ref 150.0–400.0)
RBC: 4.55 Mil/uL (ref 3.87–5.11)
RDW: 13.4 % (ref 11.5–15.5)
WBC: 8.7 10*3/uL (ref 4.0–10.5)

## 2016-09-14 LAB — COMPREHENSIVE METABOLIC PANEL
ALT: 15 U/L (ref 0–35)
AST: 15 U/L (ref 0–37)
Albumin: 4.3 g/dL (ref 3.5–5.2)
Alkaline Phosphatase: 46 U/L (ref 39–117)
BUN: 13 mg/dL (ref 6–23)
CALCIUM: 9.6 mg/dL (ref 8.4–10.5)
CHLORIDE: 109 meq/L (ref 96–112)
CO2: 23 meq/L (ref 19–32)
Creatinine, Ser: 0.81 mg/dL (ref 0.40–1.20)
GFR: 79.9 mL/min (ref >60.00)
GLUCOSE: 89 mg/dL (ref 70–99)
Potassium: 4.1 mEq/L (ref 3.5–5.1)
SODIUM: 139 meq/L (ref 135–145)
Total Bilirubin: 0.5 mg/dL (ref 0.2–1.2)
Total Protein: 7 g/dL (ref 6.0–8.3)

## 2016-09-14 NOTE — Patient Instructions (Signed)
For your rt trapezius pain continue the icey hot patches with lidocaine.  For rt shoulder pain and rt clavicle pain will get xrays of both areas.  For pain in clavicle area will also get cbc and cmp.  For pain can alternate tylenol and ibuprofen.  Will follow up in 10-14 days or as needed. May need further imaging studies depending on how

## 2016-09-14 NOTE — Progress Notes (Signed)
Subjective:    Patient ID: [name] Ramos, female    DOB: 26-Apr-1966, 51 y.o.   MRN: [id number]  HPI  Pt in for some slight irregular appearance to her rt clavicle area. Pt saw GI the other day and on exam noted irregular appearance. Pt will get egd coming up soon. Pt also note history of thyroid being enlarged and she states no scheduled repeat.(but was done in Oct 2017)  Pt states 2 weeks go felt pain in her clavicle area that was transient and noted on bending over. No trauma.   Pt recently has flare up of her chronic rt side trapezius area pain. Since last night.  Pt can't muscle relaxants. Excessive sedation. Will have extreme drowinsess fo 12 hours.  Pt also mentions after some rt shoulder pain stiffness after long rides of one hour or more.    Review of Systems  Constitutional: Negative for chills, fatigue and fever.  Respiratory: Negative for cough, chest tightness, shortness of breath and wheezing.   Cardiovascular: Negative for chest pain and palpitations.  Gastrointestinal: Negative for abdominal pain.  Musculoskeletal:       Rt clavicle pain/faint swelling. See hpi.  Neck pain.  Rt shoulder pain.  Skin: Negative for rash.  Psychiatric/Behavioral: Negative for behavioral problems and confusion.    Past Medical History:  Diagnosis Date  . Alcohol abuse    recovering alcoholic  . Allergic urticaria   . Anxiety   . Cholecystitis, acute   . Congenital anomaly of neck   . Depression   . Dyslipidemia    Followed by PCP  . Gastroparesis   . GERD (gastroesophageal reflux disease)   . Hematuria   . Hiatal hernia    with reflux  . Hypertension    stress test 11/2010 normal  . Kidney stone   . Obesity   . PVC's (premature ventricular contractions)    Controlled on beta blocker therapy;  Echo 9/10: EF 55-60%, grade 1 diastolic dysfunction, mild MR, mild LAE  . Thyroid nodule    hx  . Tinea corporis   . Unspecified contraceptive management   . Urinary tract  infection      Social History   Social History  . Marital status: Single    Spouse name: N/A  . Number of children: N/A  . 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 Smoking status: Current Every Day Smoker    Packs/day: 0.50    Years: 24.00    Types: Cigarettes    Last attempt to quit: 07/02/2008  . Smokeless tobacco: Never Used  . Alcohol use No  . Drug use: No  . Sexual activity: Not Currently    Partners: Male   Other Topics Concern  . Not on file   Social History Narrative   Exercising---walking dog daily    Past Surgical History:  Procedure Laterality Date  . leep surgery     mid 20's  . TONSILLECTOMY      Family History  Problem Relation Age of Onset  . Diabetes Mother   . Liver disease Mother   . Irritable bowel syndrome Mother   . Colon polyps Mother   . Breast cancer Mother   . Thyroid disease Mother   . Lung cancer Maternal Uncle   . Lung cancer Maternal Grandmother   . Heart attack Maternal Grandmother 48  . Diabetes Maternal Grandmother   . Lung cancer  Paternal Grandmother   . Coronary artery disease Other     female 1st degree relative 49 yo  . Hypertension Other   . Colon cancer Neg Hx   . Stomach cancer Neg Hx     Allergies  Allergen Reactions  . Ace Inhibitors Other (See Comments)    Numbness, tingling of lips.  . Piroxicam     REACTION: hives    Current Outpatient Prescriptions on File Prior to Visit  Medication Sig Dispense Refill  . ALPRAZolam (XANAX) 0.25 MG tablet Take 1 tablet (0.25 mg total) by mouth 3 (three) times daily as needed for anxiety. 30 tablet 2  . amLODipine (NORVASC) 5 MG tablet Take 1 tablet (5 mg total) by mouth daily. 30 tablet 2  . aspirin 81 MG tablet Take 81 mg by mouth daily.    . clobetasol ointment (TEMOVATE) 0.05 % Apply to affected area every night for 4 weeks, then every other day for 4 weeks and then twice a week 60 g 5  .  cyanocobalamin (,VITAMIN B-12,) 1000 MCG/ML injection Inject 1 mL (1,000 mcg total) into the muscle once. 4 mL 0  . cyclobenzaprine (FLEXERIL) 10 MG tablet Take 1 tablet (10 mg total) by mouth 3 (three) times daily as needed for muscle spasms. 30 tablet 0  . medroxyPROGESTERone (DEPO-PROVERA) 150 MG/ML injection AS DIRECTED EVERY 3 MONTHS 1 mL 4  . omeprazole (PRILOSEC) 40 MG capsule Take 1 capsule (40 mg total) by mouth daily. 90 capsule 3  . metoprolol succinate (TOPROL-XL) 100 MG 24 hr tablet Take 1 tablet (100 mg total) by mouth daily. Take with or immediately following a meal. 90 tablet 3  . [DISCONTINUED] oxybutynin (DITROPAN-XL) 10 MG 24 hr tablet Take 10 mg by mouth daily.     No current facility-administered medications on file prior to visit.     BP (!) 144/72 (BP Location: Right Arm, Patient Position: Sitting, Cuff Size: Large)   Pulse 67   Temp 98.4 F (36.9 C) (Oral)   Resp 16   Ht  (1.651 m)   Wt 187 lb (84.8 kg)   SpO2 100%   BMI 31.12 kg/m       Objective:   Physical Exam   General- No acute distress. Pleasant patient. Neck- Full range of motion, no jvd. Mild rt trapezius region tenderness to palpation. Worse where trapezius inserts into occipital area. Lungs- Clear, even and unlabored. Heart- regular rate and rhythm. Neurologic- CNII- XII grossly intact.  Lymphatic exam- note on exam of head and neck region no abnormal nodes felt. Particularly no abnormal behind in sternocleoidomastoid and no supraclavicular nodes felt.  Rt clavicle-  Medial 1/3 portion of clavicle and at junction of clavicle/sternum imld diffuse feeling of soft tissue swelling. No warmth, induration or fluctuance to skin.  Rt shoulder- good range of motion but faint crepitus. But pain over distal clavicle area on palpation/shoulder junction.     Assessment & Plan:  For your rt trapezius pain continue the icey hot patches with lidocaine.  For rt shoulder pain and rt clavicle pain will  get xrays of both areas.  For pain in clavicle area will also get cbc and cmp.(Getting both labs to assess in event can't feel lymph nodes that can't be palpated and to see if alkaline phosphatase is elevated)  For pain can alternate tylenol and ibuprofen.  Will follow up in 10-14 days or as needed. May need further imaging studies depending on how   [name] Ramos, Ramon Dredge, [place]

## 2016-09-14 NOTE — Progress Notes (Signed)
Pre visit review using our clinic review tool, if applicable. No additional management support is needed unless otherwise documented below in the visit note. 

## 2016-09-14 NOTE — Progress Notes (Signed)
Reviewed and agree with documentation and assessment and plan. K. Veena Salam [place] , MD   

## 2016-09-15 ENCOUNTER — Telehealth: Payer: Self-pay

## 2016-09-15 ENCOUNTER — Encounter: Payer: Self-pay | Admitting: Gastroenterology

## 2016-09-15 DIAGNOSIS — M898X1 Other specified disorders of bone, shoulder: Secondary | ICD-10-CM

## 2016-09-15 NOTE — Telephone Encounter (Signed)
Spoke with pt, pt state she understand results, and follow up instructions with sports medicine. Patient report she is having pain and would like to know more about the grinding sound of right shoulder when provider moved it around during office visit. Patient would like to see sports medicine. Patient had no further questions or concerns at this time. LB

## 2016-09-15 NOTE — Telephone Encounter (Signed)
-----   Message from Esperanza Richters, PA-C sent at 09/14/2016  9:22 PM EDT ----- Radiologist states the clavicle and shoulder are normal. Same plan as discussed on day of visit.  follow up in about 10-14 days. Recheck the area on exam. If the clavicle/sternum junction appears inflamed then may refer to sports medicine or more/other different  imaging studies.

## 2016-09-18 ENCOUNTER — Ambulatory Visit (HOSPITAL_COMMUNITY)
Admission: RE | Admit: 2016-09-18 | Discharge: 2016-09-18 | Disposition: A | Discharge status: Home / Self Care | Payer: Commercial Managed Care - HMO | Source: Ambulatory Visit | Attending: Nurse Practitioner | Admitting: Nurse Practitioner

## 2016-09-18 DIAGNOSIS — K219 Gastro-esophageal reflux disease without esophagitis: Secondary | ICD-10-CM | POA: Insufficient documentation

## 2016-09-18 DIAGNOSIS — K449 Diaphragmatic hernia without obstruction or gangrene: Secondary | ICD-10-CM | POA: Diagnosis not present

## 2016-09-18 DIAGNOSIS — R1310 Dysphagia, unspecified: Secondary | ICD-10-CM | POA: Diagnosis present

## 2016-09-18 MED ORDER — IOPAMIDOL (ISOVUE-300) INJECTION 61%
INTRAVENOUS | Status: AC
  Filled 2016-09-18: qty 50

## 2016-09-24 ENCOUNTER — Ambulatory Visit: Payer: Commercial Managed Care - HMO | Admitting: Medical

## 2016-09-25 ENCOUNTER — Encounter: Payer: Commercial Managed Care - HMO | Admitting: Gastroenterology

## 2016-10-02 ENCOUNTER — Encounter: Payer: Self-pay | Admitting: Obstetrics & Gynecology

## 2016-10-02 ENCOUNTER — Ambulatory Visit (INDEPENDENT_AMBULATORY_CARE_PROVIDER_SITE_OTHER): Payer: Commercial Managed Care - HMO | Admitting: Obstetrics & Gynecology

## 2016-10-02 VITALS — BP 131/68 | HR 62 | Ht 65.0 in | Wt 188.0 lb

## 2016-10-02 DIAGNOSIS — L900 Lichen sclerosus et atrophicus: Secondary | ICD-10-CM

## 2016-10-02 NOTE — Progress Notes (Signed)
Follow up from Lichen Sclerosis. Patient states she has noticed some bumps in the area. [name] StammerJennifer Howard [id number]

## 2016-10-02 NOTE — Progress Notes (Signed)
   Subjective:    Patient ID: Ronny BaconHeidi Reader, female    DOB: 1965/08/18, 51 y.o.   MRN: [id number]  HPI  51 yo lady with biopsy-proven lichen sclerosis here for her q 6 month check. She is using her temovate sporadically. She was also diagnosed with psoriasis and her dermatologist has rec'd that she use the temovate on those areas also.  Review of Systems Pap and mammogram UTD    Objective:   Physical Exam [id number] Breathing, conversing, and ambulating normally Her labia are ok but the white area of her perineum/vaginal introitus are worse. I biopsied this area after using lidocaine spray, lidocaine injection 1%, and prepping with betadine. She tolerated the procedure well.      Assessment & Plan:  Lichen- await biopsy, rec use temovate QOD RTC 6 months

## 2016-10-07 ENCOUNTER — Encounter: Payer: Self-pay | Admitting: Family Medicine

## 2016-10-07 ENCOUNTER — Telehealth: Payer: Self-pay

## 2016-10-07 MED ORDER — IMIQUIMOD 5 % EX CREA: TOPICAL_CREAM | CUTANEOUS | 1 refills | Status: DC

## 2016-10-07 NOTE — Telephone Encounter (Signed)
-----   Message from Allie BossierMyra C Dove, MD sent at 10/06/2016  7:31 PM EDT ----- Her biopsy showed condyloma. She can have a prescription for Aldara 5% to be used QOD for 16 weeks if she desires. Thanks

## 2016-10-07 NOTE — Telephone Encounter (Signed)
Left message for patient to return call to office.  Jennifer Howard RNBSN 

## 2016-10-07 NOTE — Telephone Encounter (Signed)
Patient called and made aware of results of biopsy. Patient made aware that it was a condyloma. Patient made aware that she can have a prescription for Aldara 5% cream to use every other day for sixteen weeks. Patient pharmacy confirmed. Patient asked if she should still use the temovate cream with the Aldara and confirm with Dr. Marice Potterove that she can rotate the days she uses the cream. Armandina StammerJennifer Ryleeann Urquiza RNBSN

## 2016-10-09 ENCOUNTER — Other Ambulatory Visit: Payer: Commercial Managed Care - HMO

## 2016-10-09 ENCOUNTER — Ambulatory Visit: Payer: Commercial Managed Care - HMO | Admitting: Family Medicine

## 2016-10-09 ENCOUNTER — Ambulatory Visit (INDEPENDENT_AMBULATORY_CARE_PROVIDER_SITE_OTHER): Payer: Commercial Managed Care - HMO | Admitting: Family Medicine

## 2016-10-09 VITALS — BP 118/78 | HR 57 | Temp 98.4°F | Resp 18 | Wt 187.0 lb

## 2016-10-09 DIAGNOSIS — E785 Hyperlipidemia, unspecified: Secondary | ICD-10-CM

## 2016-10-09 DIAGNOSIS — I10 Essential (primary) hypertension: Secondary | ICD-10-CM | POA: Diagnosis not present

## 2016-10-09 LAB — LIPID PANEL
Cholesterol: 216 mg/dL — ABNORMAL HIGH (ref 0–200)
HDL: 53 mg/dL (ref >39.00)
LDL CALC: 148 mg/dL — AB (ref 0–99)
NONHDL: 163.25
Total CHOL/HDL Ratio: 4
Triglycerides: 75 mg/dL (ref 0.0–149.0)
VLDL: 15 mg/dL (ref 0.0–40.0)

## 2016-10-09 MED ORDER — AMLODIPINE BESYLATE 5 MG PO TABS: 5.0000 mg | ORAL_TABLET | Freq: Every day | ORAL | 2 refills | Status: DC

## 2016-10-09 NOTE — Progress Notes (Signed)
Subjective:  I acted as a Neurosurgeonscribe for Dr. Talmage CoinLowne Princess, RMA   Patient ID: [name] BaconHeidi Ramos, female    DOB: [birth year], 51 y.o.   MRN: [id number]  Chief Complaint  Patient presents with  . Follow-up  . Hypertension  . Hyperlipidemia    HPI  Patient is in today for a 3 month follow up. Following up on HTN, hyperlipidemia and other medical concerns. Patient states she was seen by P.A a few months ago and will see sports medicine for her right shoulder and clavicle.   Patient Care Team: Zola ButtonLowne Chase, Grayling CongressYvonne R, DO as PCP - General Kathleene HazelMcAlhany, Christopher D, MD as Consulting Physician (Cardiology)   Past Medical History:  Diagnosis Date  . Alcohol abuse    recovering alcoholic  . Allergic urticaria   . Anxiety   . Cholecystitis, acute   . Congenital anomaly of neck   . Depression   . Dyslipidemia    Followed by PCP  . Gastroparesis   . GERD (gastroesophageal reflux disease)   . Hematuria   . Hiatal hernia    with reflux  . Hypertension    stress test 11/2010 normal  . Kidney stone   . Obesity   . PVC's (premature ventricular contractions)    Controlled on beta blocker therapy;  Echo 9/10: EF 55-60%, grade 1 diastolic dysfunction, mild MR, mild LAE  . Thyroid nodule    hx  . Tinea corporis   . Unspecified contraceptive management   . Urinary tract infection     Past Surgical History:  Procedure Laterality Date  . leep surgery     mid 20's  . TONSILLECTOMY      Family History  Problem Relation Age of Onset  . Diabetes Mother   . Liver disease Mother   . Irritable bowel syndrome Mother   . Colon polyps Mother   . Breast cancer Mother   . Thyroid disease Mother   . Lung cancer Maternal Uncle   . Lung cancer Maternal Grandmother   . Heart attack Maternal Grandmother 48  . Diabetes Maternal Grandmother   . Lung cancer Paternal Grandmother   . Coronary artery disease Other        female 1st degree relative 49 yo  . Hypertension Other   . Colon cancer Neg Hx     . Stomach cancer Neg Hx     Social History   Social History  . Marital status: Single    Spouse name: N/A  . Number of children: N/A  . Years of education: N/A   Occupational History  . 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 Smoking status: Current Every Day Smoker    Packs/day: 0.50    Years: 24.00    Types: Cigarettes    Last attempt to quit: 07/02/2008  . Smokeless tobacco: Never Used  . Alcohol use No  . Drug use: No  . Sexual activity: Not Currently    Partners: Male   Other Topics Concern  . Not on file   Social History Narrative   Exercising---walking dog daily    Outpatient Medications Prior to Visit  Medication Sig Dispense Refill  . ALPRAZolam (XANAX) 0.25 MG tablet Take 1 tablet (0.25 mg total) by mouth 3 (three) times daily as needed for anxiety. 30 tablet 2  . aspirin 81 MG tablet Take 81 mg by mouth daily.    . clobetasol ointment (TEMOVATE) 0.05 % Apply to affected area every night  for 4 weeks, then every other day for 4 weeks and then twice a week 60 g 5  . cyanocobalamin (,VITAMIN B-12,) 1000 MCG/ML injection Inject 1 mL (1,000 mcg total) into the muscle once. 4 mL 0  . cyclobenzaprine (FLEXERIL) 10 MG tablet Take 1 tablet (10 mg total) by mouth 3 (three) times daily as needed for muscle spasms. 30 tablet 0  . imiquimod (ALDARA) 5 % cream Apply topically every other day. 12 each 1  . medroxyPROGESTERone (DEPO-PROVERA) 150 MG/ML injection AS DIRECTED EVERY 3 MONTHS 1 mL 4  . omeprazole (PRILOSEC) 40 MG capsule Take 1 capsule (40 mg total) by mouth daily. 90 capsule 3  . amLODipine (NORVASC) 5 MG tablet Take 1 tablet (5 mg total) by mouth daily. 30 tablet 2  . metoprolol succinate (TOPROL-XL) 100 MG 24 hr tablet Take 1 tablet (100 mg total) by mouth daily. Take with or immediately following a meal. 90 tablet 3   No facility-administered medications prior to visit.     Allergies  Allergen Reactions  . Ace Inhibitors Other (See  Comments)    Numbness, tingling of lips.  . Piroxicam     REACTION: hives    Review of Systems  Constitutional: Negative for fever and malaise/fatigue.  HENT: Negative for congestion.   Eyes: Negative for blurred vision.  Respiratory: Negative for cough and shortness of breath.   Cardiovascular: Negative for chest pain, palpitations and leg swelling.  Gastrointestinal: Negative for vomiting.  Musculoskeletal: Negative for back pain.  Skin: Negative for rash.  Neurological: Negative for loss of consciousness and headaches.       Objective:    Physical Exam  Constitutional: She is oriented to person, place, and time. She appears well-developed and well-nourished. No distress.  HENT:  Head: Normocephalic and atraumatic.  Eyes: Conjunctivae are normal.  Neck: Normal range of motion. No thyromegaly present.  Cardiovascular: Normal rate and regular rhythm.   Pulmonary/Chest: Effort normal and breath sounds normal. She has no wheezes.  Abdominal: Soft. Bowel sounds are normal. There is no tenderness.  Musculoskeletal: Normal range of motion. She exhibits no edema or deformity.  Neurological: She is alert and oriented to person, place, and time.  Skin: Skin is warm and dry. She is not diaphoretic.  Psychiatric: She has a normal mood and affect.    BP 118/78 (BP Location: Left Arm, Patient Position: Sitting, Cuff Size: Normal)   Pulse (!) 57   Temp 98.4 F (36.9 C) (Oral)   Resp 18   Wt 187 lb (84.8 kg)   SpO2 98%   BMI 31.12 kg/m  Wt Readings from Last 3 Encounters:  10/09/16 187 lb (84.8 kg)  10/02/16 188 lb (85.3 kg)  09/14/16 187 lb (84.8 kg)   BP Readings from Last 3 Encounters:  10/09/16 118/78  10/02/16 131/68  09/14/16 (!) 144/72     Immunization History  Administered Date(s) Administered  . Td 01/21/2007    Health Maintenance  Topic Date Due  . INFLUENZA VACCINE  03/02/2017 (Originally 12/30/2016)  . HIV Screening  08/28/2035 (Originally 10/16/1982)  .  TETANUS/TDAP  01/20/2017  . PAP SMEAR  11/28/2017    Lab Results  Component Value Date   WBC 8.7 09/14/2016   HGB 14.4 09/14/2016   HCT 43.2 09/14/2016   PLT 237.0 09/14/2016   GLUCOSE 89 09/14/2016   CHOL 216 (H) 10/09/2016   TRIG 75.0 10/09/2016   HDL 53.00 10/09/2016   LDLDIRECT 148.1 01/20/2008   LDLCALC 148 (H)  10/09/2016   ALT 15 09/14/2016   AST 15 09/14/2016   NA 139 09/14/2016   K 4.1 09/14/2016   CL 109 09/14/2016   CREATININE 0.81 09/14/2016   BUN 13 09/14/2016   CO2 23 09/14/2016   TSH 1.14 12/30/2015    Lab Results  Component Value Date   TSH 1.14 12/30/2015   Lab Results  Component Value Date   WBC 8.7 09/14/2016   HGB 14.4 09/14/2016   HCT 43.2 09/14/2016   MCV 95.0 09/14/2016   PLT 237.0 09/14/2016   Lab Results  Component Value Date   NA 139 09/14/2016   K 4.1 09/14/2016   CO2 23 09/14/2016   GLUCOSE 89 09/14/2016   BUN 13 09/14/2016   CREATININE 0.81 09/14/2016   BILITOT 0.5 09/14/2016   ALKPHOS 46 09/14/2016   AST 15 09/14/2016   ALT 15 09/14/2016   PROT 7.0 09/14/2016   ALBUMIN 4.3 09/14/2016   CALCIUM 9.6 09/14/2016   ANIONGAP 8 08/30/2015   GFR 79.90 09/14/2016   Lab Results  Component Value Date   CHOL 216 (H) 10/09/2016   Lab Results  Component Value Date   HDL 53.00 10/09/2016   Lab Results  Component Value Date   LDLCALC 148 (H) 10/09/2016   Lab Results  Component Value Date   TRIG 75.0 10/09/2016   Lab Results  Component Value Date   CHOLHDL 4 10/09/2016   No results found for: HGBA1C       Assessment & Plan:   Problem List Items Addressed This Visit      Unprioritized   Essential hypertension - Primary    Well controlled, no changes to meds. Encouraged heart healthy diet such as the DASH diet and exercise as tolerated.       Relevant Medications   amLODipine (NORVASC) 5 MG tablet   Hyperlipidemia    Encouraged heart healthy diet, increase exercise, avoid trans fats, consider a krill oil cap  daily      Relevant Medications   amLODipine (NORVASC) 5 MG tablet   Other Relevant Orders   Lipid panel (Completed)      I am having [name] Ramos maintain her cyanocobalamin, medroxyPROGESTERone, aspirin, omeprazole, clobetasol ointment, metoprolol succinate, ALPRAZolam, cyclobenzaprine, imiquimod, and amLODipine.  Meds ordered this encounter  Medications  . amLODipine (NORVASC) 5 MG tablet    Sig: Take 1 tablet (5 mg total) by mouth daily.    Dispense:  30 tablet    Refill:  2    CMA served as scribe during this visit. History, Physical and Plan performed by medical provider. Documentation and orders reviewed and attested to.  Donato Schultz, DO

## 2016-10-09 NOTE — Patient Instructions (Signed)

## 2016-10-11 ENCOUNTER — Encounter: Payer: Self-pay | Admitting: Family Medicine

## 2016-10-11 NOTE — Assessment & Plan Note (Addendum)
Encouraged heart healthy diet, increase exercise, avoid trans fats, consider a krill oil cap daily 

## 2016-10-11 NOTE — Assessment & Plan Note (Signed)
Well controlled, no changes to meds. Encouraged heart healthy diet such as the DASH diet and exercise as tolerated.  °

## 2016-11-10 ENCOUNTER — Other Ambulatory Visit: Payer: Self-pay | Admitting: Obstetrics & Gynecology

## 2016-11-19 ENCOUNTER — Ambulatory Visit: Payer: Commercial Managed Care - HMO | Admitting: Obstetrics & Gynecology

## 2016-11-20 ENCOUNTER — Telehealth: Payer: Self-pay | Admitting: Family Medicine

## 2016-11-20 DIAGNOSIS — R221 Localized swelling, mass and lump, neck: Secondary | ICD-10-CM

## 2016-11-20 NOTE — Telephone Encounter (Signed)
Please advise 

## 2016-11-20 NOTE — Telephone Encounter (Signed)
[place] thyroid ---for lump in neck

## 2016-11-20 NOTE — Telephone Encounter (Signed)
Pt request ultrasound per Zola ButtonLowne Chase recommendations at last appt. Pt states clavicle lump is bigger. [place] at the same place she always gets her mammogram and thryoid scan.

## 2016-11-20 NOTE — Telephone Encounter (Signed)
US thyroid ordered.

## 2016-11-23 ENCOUNTER — Ambulatory Visit (INDEPENDENT_AMBULATORY_CARE_PROVIDER_SITE_OTHER): Payer: 59 | Admitting: Obstetrics & Gynecology

## 2016-11-23 ENCOUNTER — Encounter: Payer: Self-pay | Admitting: Obstetrics & Gynecology

## 2016-11-23 VITALS — BP 159/90 | HR 69 | Wt 186.0 lb

## 2016-11-23 DIAGNOSIS — L900 Lichen sclerosus et atrophicus: Secondary | ICD-10-CM

## 2016-11-23 DIAGNOSIS — N9089 Other specified noninflammatory disorders of vulva and perineum: Secondary | ICD-10-CM

## 2016-11-23 NOTE — Patient Instructions (Signed)
Genital Warts Genital warts are a common STD (sexually transmitted disease). They may appear as small bumps on the tissues of the genital area or anal area. Sometimes, they can become irritated and cause pain. Genital warts are easily passed to other people through sexual contact. Getting treatment is important because genital warts can lead to other problems. In females, the virus that causes genital warts may increase the risk of cervical cancer. What are the causes? Genital warts are caused by a virus that is called human papillomavirus (HPV). HPV is spread by having unprotected sex with an infected person. It can be spread through vaginal, anal, and oral sex. Many people do not know that they are infected. They may be infected for years without problems. However, even if they do not have problems, they can pass the infection to their sexual partners. What increases the risk? Genital warts are more likely to develop in:  People who have unprotected sex.  People who have multiple sexual partners.  People who become sexually active before they are 49 years of age.  Men who are not circumcised.  Women who have a female sexual partner who is not circumcised.  People who have a weakened body defense system (immune system) due to disease or medicine.  People who smoke.  What are the signs or symptoms? Symptoms of genital warts include:  Small growths in the genital area or anal area. These warts often grow in clusters.  Itching and irritation in the genital area or anal area.  Bleeding from the warts.  Painful sexual intercourse.  How is this diagnosed? Genital warts can usually be diagnosed from their appearance on the vagina, vulva, penis, perineum, anus, or rectum. Tests may also be done, such as:  Biopsy. A tissue sample is removed so it can be looked at under a microscope.  Colposcopy. In females, a magnifying tool is used to examine the vagina and cervix. Certain solutions may  be used to make the HPV cells change color so they can be seen more easily.  A Pap test in females.  Tests for other STDs.  How is this treated? Treatment for genital warts may include:  Applying prescription medicines to the warts. These may be solutions or creams.  Freezing the warts with liquid nitrogen (cryotherapy).  Burning the warts with: ? Laser treatment. ? An electrified probe (electrocautery).  Injecting a substance (Candida antigen or Trichophyton antigen) into the warts to help the body's immune system to fight off the warts.  Interferon injections.  Surgery to remove the warts.  Follow these instructions at home: Medicines  Apply over-the-counter and prescription medicines only as told by your health care provider.  Do not treat genital warts with medicines that are used for treating hand warts.  Talk with your health care provider about using over-the-counter anti-itch creams. General instructions  Do not touch or scratch the warts.  Do not have sex until your treatment has been completed.  Tell your current and past sexual partners about your condition because they may also need treatment.  Keep all follow-up visits as told by your health care provider. This is important.  After treatment, use condoms during sex to prevent future infections. Other Instructions for Women  Women who have genital warts might need increased screening for cervical cancer. This type of cancer is slow growing and can be cured if it is found early. Chances of developing cervical cancer are increased with HPV.  If you become pregnant, tell your health   care provider that you have had HPV. Your health care provider will monitor you closely during pregnancy to be sure that your baby is safe. How is this prevented? Talk with your health care provider about getting the HPV vaccines. These vaccines prevent some HPV infections and cancers. It is recommended that the vaccine be given to  males and females who are 9-26 years of age. It will not work if you already have HPV, and it is not recommended for pregnant women. Contact a health care provider if:  You have redness, swelling, or pain in the area of the treated skin.  You have a fever.  You feel generally ill.  You feel lumps in and around your genital area or anal area.  You have bleeding in your genital area or anal area.  You have pain during sexual intercourse. This information is not intended to replace advice given to you by your health care provider. Make sure you discuss any questions you have with your health care provider. Document Released: 05/15/2000 Document Revised: 10/24/2015 Document Reviewed: 08/13/2014 Elsevier Interactive Patient Education  2018 Elsevier Inc.  

## 2016-11-23 NOTE — Progress Notes (Signed)
Patient complaining of vulvar swelling and redness. [name] StammerJennifer Encarnacion Ramos RNBSN

## 2016-11-23 NOTE — Progress Notes (Signed)
History:  51 y.o. G3P0030 here today for eval of vulvar irritation and swelling. Pt has been using Aldara for a genital wart. She is alternating the Aldara with the steroid cream for Lichen sclerosus. She reports that she is not sure where the wart is and she has been placing the entire small packet on the area that she thought contained the lesion.  She denies vaginal discharge or internal itching.  The following portions of the patient's history were reviewed and updated as appropriate: allergies, current medications, past family history, past medical history, past social history, past surgical history and problem list.  Review of Systems:  Pertinent items are noted in HPI.   Objective:  Physical Exam Blood pressure (!) 159/90, pulse 69, weight 186 lb (84.4 kg). CONSTITUTIONAL: Well-developed, well-nourished female in no acute distress.  HENT:  Normocephalic, atraumatic EYES: Conjunctivae and EOM are normal. No scleral icterus.  NECK: Normal range of motion SKIN: Skin is warm and dry. No rash noted. Not diaphoretic.No pallor. NEUROLGIC: Alert and oriented to person, place, and time. Normal coordination.  Pelvic: external genitalia: erythema noted greatest on the perineum near the anus. Edema noted of the entire area including the clitoris. Normal appearing vaginal mucosa and cervix.  Normal discharge.  The leukoplakia appears improved but, difficult to fully appreciate given the erythema.   I did not see an genital warts. This could be obscured by the edema.  Labs and Imaging 10/02/2016 Diagnosis Vulva, biopsy, vaginal introitus - CONDYLOMA. 01/23/2016 Diagnosis Vulva, biopsy, Right - LICHEN SCLEROSUS. - NO DYSPLASIA, ATYPIA OR MALIGNANCY IDENTIFIED.   Assessment & Plan:  Vulva edema- suspect a contact reation due to the Aldara.  Rec stop the Aldara. F/u in 1 week to reeval Keep Clobetasol every other day Cortisone tid for 1 week F/u sooner prn  Abir Craine L. Harraway-Smith, M.D.,  [place] CoreFACOG

## 2016-11-27 ENCOUNTER — Ambulatory Visit (INDEPENDENT_AMBULATORY_CARE_PROVIDER_SITE_OTHER): Payer: 59

## 2016-11-27 DIAGNOSIS — R221 Localized swelling, mass and lump, neck: Secondary | ICD-10-CM | POA: Diagnosis not present

## 2016-11-30 ENCOUNTER — Other Ambulatory Visit: Payer: Self-pay | Admitting: *Deleted

## 2016-11-30 ENCOUNTER — Ambulatory Visit: Payer: 59 | Admitting: Obstetrics & Gynecology

## 2016-11-30 DIAGNOSIS — R221 Localized swelling, mass and lump, neck: Secondary | ICD-10-CM

## 2016-11-30 NOTE — Progress Notes (Signed)
error 

## 2016-12-03 ENCOUNTER — Ambulatory Visit (INDEPENDENT_AMBULATORY_CARE_PROVIDER_SITE_OTHER): Payer: 59 | Admitting: Obstetrics & Gynecology

## 2016-12-03 ENCOUNTER — Encounter: Payer: Self-pay | Admitting: Obstetrics & Gynecology

## 2016-12-03 VITALS — BP 126/56 | HR 62 | Ht 65.0 in | Wt 187.0 lb

## 2016-12-03 DIAGNOSIS — T7840XD Allergy, unspecified, subsequent encounter: Secondary | ICD-10-CM

## 2016-12-03 DIAGNOSIS — B373 Candidiasis of vulva and vagina: Secondary | ICD-10-CM | POA: Diagnosis not present

## 2016-12-03 DIAGNOSIS — B3731 Acute candidiasis of vulva and vagina: Secondary | ICD-10-CM

## 2016-12-03 MED ORDER — TERCONAZOLE 0.4 % VA CREA: 1.0000 | TOPICAL_CREAM | Freq: Every day | VAGINAL | 0 refills | Status: DC

## 2016-12-03 NOTE — Progress Notes (Signed)
History:  51 y.o. G3P0030 here today for f/u of vulvar edema. Suspect reaction to Aldara. Pt reports that her sx are markedly improved off the Aldrar and the the hydrocortisone cream.  Pts last PAP was 10/2014 and was WNL .  The following portions of the patient's history were reviewed and updated as appropriate: allergies, current medications, past family history, past medical history, past social history, past surgical history and problem list.  Review of Systems:  Pertinent items are noted in HPI.   Objective:  Physical Exam Blood pressure (!) 126/56, pulse 62, height 5\' 5"  (1.651 m), weight 187 lb (84.8 kg). CONSTITUTIONAL: Well-developed, well-nourished female in no acute distress.  HENT:  Normocephalic, atraumatic EYES: Conjunctivae and EOM are normal. No scleral icterus.  NECK: Normal range of motion SKIN: Skin is warm and dry. No rash noted. Not diaphoretic.No pallor. NEUROLGIC: Alert and oriented to person, place, and time. Normal coordination.  Pelvic: Normal appearing external genitalia; th eedema has resolved and the erythema is markedly improved. There is still some erythema with satellite lesions between the buttocks c/iw yeast.  Normal discharge.     Assessment & Plan:  Allergic vuvlovaginitis- improved Yeast on perineum  F/u 01/2018 for annual and to recheck vuvla F/u sooner prn Terazol cream and baking soda between buttocks  Total face-to-face time with patient was 10 min.  Greater than 50% was spent in counseling and coordination of care with the patient.  Tige Meas L. Harraway-Smith, M.D., [place] [employer]FAC[employer]

## 2016-12-07 ENCOUNTER — Ambulatory Visit (INDEPENDENT_AMBULATORY_CARE_PROVIDER_SITE_OTHER): Payer: 59

## 2016-12-07 DIAGNOSIS — R221 Localized swelling, mass and lump, neck: Secondary | ICD-10-CM | POA: Diagnosis not present

## 2016-12-07 MED ORDER — IOPAMIDOL (ISOVUE-300) INJECTION 61%
100.0000 mL | Freq: Once | INTRAVENOUS | Status: AC | PRN
  Administered 2016-12-07: 75 mL via INTRAVENOUS

## 2016-12-10 ENCOUNTER — Other Ambulatory Visit: Payer: Self-pay

## 2016-12-10 MED ORDER — TERCONAZOLE 0.4 % VA CREA: 1.0000 | TOPICAL_CREAM | Freq: Every day | VAGINAL | 0 refills | Status: DC

## 2017-01-06 ENCOUNTER — Other Ambulatory Visit: Payer: Self-pay | Admitting: Family Medicine

## 2017-01-06 DIAGNOSIS — K219 Gastro-esophageal reflux disease without esophagitis: Secondary | ICD-10-CM

## 2017-01-06 NOTE — Telephone Encounter (Signed)
Rx sent to pharmacy. LB 

## 2017-02-04 ENCOUNTER — Encounter: Payer: Self-pay | Admitting: Family Medicine

## 2017-02-04 ENCOUNTER — Ambulatory Visit (INDEPENDENT_AMBULATORY_CARE_PROVIDER_SITE_OTHER): Payer: 59 | Admitting: Family Medicine

## 2017-02-04 ENCOUNTER — Other Ambulatory Visit (HOSPITAL_COMMUNITY)
Admission: RE | Admit: 2017-02-04 | Discharge: 2017-02-04 | Disposition: A | Discharge status: Home / Self Care | Payer: 59 | Source: Ambulatory Visit | Attending: Family Medicine | Admitting: Family Medicine

## 2017-02-04 VITALS — BP 128/78 | HR 67 | Temp 98.2°F | Ht 65.0 in | Wt 189.8 lb

## 2017-02-04 DIAGNOSIS — E785 Hyperlipidemia, unspecified: Secondary | ICD-10-CM | POA: Diagnosis not present

## 2017-02-04 DIAGNOSIS — Z0000 Encounter for general adult medical examination without abnormal findings: Secondary | ICD-10-CM

## 2017-02-04 DIAGNOSIS — L409 Psoriasis, unspecified: Secondary | ICD-10-CM

## 2017-02-04 DIAGNOSIS — R87610 Atypical squamous cells of undetermined significance on cytologic smear of cervix (ASC-US): Secondary | ICD-10-CM | POA: Insufficient documentation

## 2017-02-04 DIAGNOSIS — H918X2 Other specified hearing loss, left ear: Secondary | ICD-10-CM | POA: Diagnosis not present

## 2017-02-04 DIAGNOSIS — Z23 Encounter for immunization: Secondary | ICD-10-CM | POA: Diagnosis not present

## 2017-02-04 DIAGNOSIS — Z3009 Encounter for other general counseling and advice on contraception: Secondary | ICD-10-CM

## 2017-02-04 DIAGNOSIS — F411 Generalized anxiety disorder: Secondary | ICD-10-CM

## 2017-02-04 DIAGNOSIS — B9689 Other specified bacterial agents as the cause of diseases classified elsewhere: Secondary | ICD-10-CM | POA: Diagnosis not present

## 2017-02-04 DIAGNOSIS — R319 Hematuria, unspecified: Secondary | ICD-10-CM

## 2017-02-04 DIAGNOSIS — K219 Gastro-esophageal reflux disease without esophagitis: Secondary | ICD-10-CM | POA: Diagnosis not present

## 2017-02-04 DIAGNOSIS — I10 Essential (primary) hypertension: Secondary | ICD-10-CM

## 2017-02-04 DIAGNOSIS — H7292 Unspecified perforation of tympanic membrane, left ear: Secondary | ICD-10-CM | POA: Diagnosis not present

## 2017-02-04 LAB — CBC WITH DIFFERENTIAL/PLATELET
BASOS ABS: 0 10*3/uL (ref 0.0–0.1)
Basophils Relative: 0.5 % (ref 0.0–3.0)
EOS ABS: 0 10*3/uL (ref 0.0–0.7)
Eosinophils Relative: 0.6 % (ref 0.0–5.0)
HCT: 43.3 % (ref 36.0–46.0)
Hemoglobin: 14.3 g/dL (ref 12.0–15.0)
LYMPHS ABS: 1.7 10*3/uL (ref 0.7–4.0)
LYMPHS PCT: 23.4 % (ref 12.0–46.0)
MCHC: 33 g/dL (ref 30.0–36.0)
MCV: 95.4 fl (ref 78.0–100.0)
MONOS PCT: 5.9 % (ref 3.0–12.0)
Monocytes Absolute: 0.4 10*3/uL (ref 0.1–1.0)
NEUTROS ABS: 4.9 10*3/uL (ref 1.4–7.7)
NEUTROS PCT: 69.6 % (ref 43.0–77.0)
PLATELETS: 232 10*3/uL (ref 150.0–400.0)
RBC: 4.54 Mil/uL (ref 3.87–5.11)
RDW: 13.6 % (ref 11.5–15.5)
WBC: 7.1 10*3/uL (ref 4.0–10.5)

## 2017-02-04 LAB — TSH: TSH: 0.9 u[IU]/mL (ref 0.35–4.50)

## 2017-02-04 LAB — COMPREHENSIVE METABOLIC PANEL
ALBUMIN: 4.4 g/dL (ref 3.5–5.2)
ALK PHOS: 48 U/L (ref 39–117)
ALT: 18 U/L (ref 0–35)
AST: 19 U/L (ref 0–37)
BUN: 11 mg/dL (ref 6–23)
CALCIUM: 9.6 mg/dL (ref 8.4–10.5)
CO2: 24 meq/L (ref 19–32)
CREATININE: 0.81 mg/dL (ref 0.40–1.20)
Chloride: 108 mEq/L (ref 96–112)
GFR: 79.77 mL/min (ref >60.00)
Glucose, Bld: 92 mg/dL (ref 70–99)
Potassium: 3.8 mEq/L (ref 3.5–5.1)
Sodium: 139 mEq/L (ref 135–145)
TOTAL PROTEIN: 7.2 g/dL (ref 6.0–8.3)
Total Bilirubin: 0.6 mg/dL (ref 0.2–1.2)

## 2017-02-04 LAB — POC URINALSYSI DIPSTICK (AUTOMATED)
Bilirubin, UA: NEGATIVE
GLUCOSE UA: NEGATIVE
Ketones, UA: NEGATIVE
Leukocytes, UA: NEGATIVE
NITRITE UA: NEGATIVE
PROTEIN UA: NEGATIVE
Spec Grav, UA: 1.015 (ref 1.010–1.025)
UROBILINOGEN UA: 0.2 U/dL
pH, UA: 6 (ref 5.0–8.0)

## 2017-02-04 LAB — HEMOCCULT GUIAC POC 1CARD (OFFICE): FECAL OCCULT BLD: NEGATIVE

## 2017-02-04 LAB — LIPID PANEL
Cholesterol: 207 mg/dL — ABNORMAL HIGH (ref 0–200)
HDL: 46.8 mg/dL (ref >39.00)
LDL Cholesterol: 147 mg/dL — ABNORMAL HIGH (ref 0–99)
NonHDL: 160.29
TRIGLYCERIDES: 65 mg/dL (ref 0.0–149.0)
Total CHOL/HDL Ratio: 4
VLDL: 13 mg/dL (ref 0.0–40.0)

## 2017-02-04 MED ORDER — AMLODIPINE BESYLATE 5 MG PO TABS: 5.0000 mg | ORAL_TABLET | Freq: Every day | ORAL | 3 refills | Status: DC

## 2017-02-04 MED ORDER — MEDROXYPROGESTERONE ACETATE 150 MG/ML IM SUSP: INTRAMUSCULAR | 3 refills | Status: DC

## 2017-02-04 MED ORDER — METOPROLOL SUCCINATE ER 100 MG PO TB24: 100.0000 mg | ORAL_TABLET | Freq: Every day | ORAL | 3 refills | Status: DC

## 2017-02-04 MED ORDER — OMEPRAZOLE 40 MG PO CPDR: 40.0000 mg | DELAYED_RELEASE_CAPSULE | Freq: Every day | ORAL | 3 refills | Status: DC

## 2017-02-04 MED ORDER — ALPRAZOLAM 0.25 MG PO TABS: 0.2500 mg | ORAL_TABLET | Freq: Three times a day (TID) | ORAL | 2 refills | Status: DC | PRN

## 2017-02-04 NOTE — Assessment & Plan Note (Signed)
Per derm 

## 2017-02-04 NOTE — Patient Instructions (Signed)
Preventive Care 40-64 Years, Female Preventive care refers to lifestyle choices and visits with your health care provider that can promote health and wellness. What does preventive care include?  A yearly physical exam. This is also called an annual well check.  Dental exams once or twice a year.  Routine eye exams. Ask your health care provider how often you should have your eyes checked.  Personal lifestyle choices, including: ? Daily care of your teeth and gums. ? Regular physical activity. ? Eating a healthy diet. ? Avoiding tobacco and drug use. ? Limiting alcohol use. ? Practicing safe sex. ? Taking low-dose aspirin daily starting at age 58. ? Taking vitamin and mineral supplements as recommended by your health care provider. What happens during an annual well check? The services and screenings done by your health care provider during your annual well check will depend on your age, overall health, lifestyle risk factors, and family history of disease. Counseling Your health care provider may ask you questions about your:  Alcohol use.  Tobacco use.  Drug use.  Emotional well-being.  Home and relationship well-being.  Sexual activity.  Eating habits.  Work and work Statistician.  Method of birth control.  Menstrual cycle.  Pregnancy history.  Screening You may have the following tests or measurements:  Height, weight, and BMI.  Blood pressure.  Lipid and cholesterol levels. These may be checked every 5 years, or more frequently if you are over 81 years old.  Skin check.  Lung cancer screening. You may have this screening every year starting at age 78 if you have a 30-pack-year history of smoking and currently smoke or have quit within the past 15 years.  Fecal occult blood test (FOBT) of the stool. You may have this test every year starting at age 65.  Flexible sigmoidoscopy or colonoscopy. You may have a sigmoidoscopy every 5 years or a colonoscopy  every 10 years starting at age 30.  Hepatitis C blood test.  Hepatitis B blood test.  Sexually transmitted disease (STD) testing.  Diabetes screening. This is done by checking your blood sugar (glucose) after you have not eaten for a while (fasting). You may have this done every 1-3 years.  Mammogram. This may be done every 1-2 years. Talk to your health care provider about when you should start having regular mammograms. This may depend on whether you have a family history of breast cancer.  BRCA-related cancer screening. This may be done if you have a family history of breast, ovarian, tubal, or peritoneal cancers.  Pelvic exam and Pap test. This may be done every 3 years starting at age 80. Starting at age 36, this may be done every 5 years if you have a Pap test in combination with an HPV test.  Bone density scan. This is done to screen for osteoporosis. You may have this scan if you are at high risk for osteoporosis.  Discuss your test results, treatment options, and if necessary, the need for more tests with your health care provider. Vaccines Your health care provider may recommend certain vaccines, such as:  Influenza vaccine. This is recommended every year.  Tetanus, diphtheria, and acellular pertussis (Tdap, Td) vaccine. You may need a Td booster every 10 years.  Varicella vaccine. You may need this if you have not been vaccinated.  Zoster vaccine. You may need this after age 5.  Measles, mumps, and rubella (MMR) vaccine. You may need at least one dose of MMR if you were born in  1957 or later. You may also need a second dose.  Pneumococcal 13-valent conjugate (PCV13) vaccine. You may need this if you have certain conditions and were not previously vaccinated.  Pneumococcal polysaccharide (PPSV23) vaccine. You may need one or two doses if you smoke cigarettes or if you have certain conditions.  Meningococcal vaccine. You may need this if you have certain  conditions.  Hepatitis A vaccine. You may need this if you have certain conditions or if you travel or work in places where you may be exposed to hepatitis A.  Hepatitis B vaccine. You may need this if you have certain conditions or if you travel or work in places where you may be exposed to hepatitis B.  Haemophilus influenzae type b (Hib) vaccine. You may need this if you have certain conditions.  Talk to your health care provider about which screenings and vaccines you need and how often you need them. This information is not intended to replace advice given to you by your health care provider. Make sure you discuss any questions you have with your health care provider. Document Released: 06/14/2015 Document Revised: 02/05/2016 Document Reviewed: 03/19/2015 Elsevier Interactive Patient Education  2017 [employer].

## 2017-02-04 NOTE — Assessment & Plan Note (Signed)
Encouraged heart healthy diet, increase exercise, avoid trans fats, consider a krill oil cap daily 

## 2017-02-04 NOTE — Assessment & Plan Note (Signed)
Well controlled, no changes to meds. Encouraged heart healthy diet such as the DASH diet and exercise as tolerated.  °

## 2017-02-04 NOTE — Progress Notes (Signed)
Subjective:     [name] Ramos is a 51 y.o. female and is here for a comprehensive physical exam. The patient reports problems - ruptured ear drum about 3 weeks ago --L ear.  She also c/o psoriasis acting up and she is using a lot of steroid cream-- she plans to go back to the dermatologist.  She has had some hearing loss since the ruptured ear drum   Social History   Social History  . Marital status: Single    Spouse name: N/A  . Number of children: N/A  . 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 Smoking status: Current Every Day Smoker    Packs/day: 0.50    Years: 24.00    Types: Cigarettes    Last attempt to quit: 07/02/2008  . Smokeless tobacco: Never Used  . Alcohol use No  . Drug use: No  . Sexual activity: Not Currently    Partners: Male   Other Topics Concern  . Not on file   Social History Narrative   Exercising---walking dog daily   Health Maintenance  Topic Date Due  . Janet Berlin  01/20/2017  . INFLUENZA VACCINE  03/02/2017 (Originally 12/30/2016)  . HIV Screening  08/28/2035 (Originally 10/16/1982)  . PAP SMEAR  11/28/2017    The following portions of the patient's history were reviewed and updated as appropriate:  She  has a past medical history of Alcohol abuse; Allergic urticaria; Anxiety; Cholecystitis, acute; Congenital anomaly of neck; Depression; Dyslipidemia; Gastroparesis; GERD (gastroesophageal reflux disease); Hematuria; Hiatal hernia; Hypertension; Kidney stone; Obesity; PVC's (premature ventricular contractions); Thyroid nodule; Tinea corporis; Unspecified contraceptive management; and Urinary tract infection. She  does not have any pertinent problems on file. She  has a past surgical history that includes Tonsillectomy and leep surgery. Her family history includes Breast cancer in her mother; Colon polyps in her mother; Coronary artery disease in her other; Diabetes in her maternal  grandmother and mother; Heart attack (age of onset: 32) in her maternal grandmother; Hypertension in her other; Irritable bowel syndrome in her mother; Liver disease in her mother; Lung cancer in her maternal grandmother, maternal uncle, and paternal grandmother; Thyroid disease in her mother. She  reports that she has been smoking Cigarettes.  She has a 12.00 pack-year smoking history. She has never used smokeless tobacco. She reports that she does not drink alcohol or use drugs. She has a current medication list which includes the following prescription(s): alprazolam, amlodipine, aspirin, clobetasol ointment, medroxyprogesterone, metoprolol succinate, omeprazole, cyanocobalamin, cyclobenzaprine, and terconazole. Current Outpatient Prescriptions on File Prior to Visit  Medication Sig Dispense Refill  . aspirin 81 MG tablet Take 81 mg by mouth daily.    . clobetasol ointment (TEMOVATE) 0.05 % Apply to affected area every night for 4 weeks, then every other day for 4 weeks and then twice a week 60 g 5  . cyanocobalamin (,VITAMIN B-12,) 1000 MCG/ML injection Inject 1 mL (1,000 mcg total) into the muscle once. (Patient not taking: Reported on 02/04/2017) 4 mL 0  . cyclobenzaprine (FLEXERIL) 10 MG tablet Take 1 tablet (10 mg total) by mouth 3 (three) times daily as needed for muscle spasms. (Patient not taking: Reported on 02/04/2017) 30 tablet 0  . terconazole (TERAZOL 7) 0.4 % vaginal cream Place 1 applicator vaginally at bedtime. Use for seven days (Patient not taking: Reported on 02/04/2017) 45 g 0  . [DISCONTINUED] oxybutynin (DITROPAN-XL) 10 MG 24  hr tablet Take 10 mg by mouth daily.     No current facility-administered medications on file prior to visit.    She is allergic to ace inhibitors and piroxicam..  Review of Systems Review of Systems  Constitutional: Negative for activity change, appetite change and fatigue.  HENT: Negative for hearing loss, congestion, tinnitus and ear discharge.  dentist  q22m Eyes: Negative for visual disturbance (see optho q1y -- vision corrected to 20/20 with glasses).  Respiratory: Negative for cough, chest tightness and shortness of breath.   Cardiovascular: Negative for chest pain, palpitations and leg swelling.  Gastrointestinal: Negative for abdominal pain, diarrhea, constipation and abdominal distention.  Genitourinary: Negative for urgency, frequency, decreased urine volume and difficulty urinating.  Musculoskeletal: Negative for back pain, arthralgias and gait problem.  Skin: + red scaly patches c/w psoriasis -- elbows , knees  Neurological: Negative for dizziness, light-headedness, numbness and headaches.  Hematological: Negative for adenopathy. Does not bruise/bleed easily.  Psychiatric/Behavioral: Negative for suicidal ideas, confusion, sleep disturbance, self-injury, dysphoric mood, decreased concentration and agitation.      Objective:    BP 128/78 (BP Location: Left Arm, Patient Position: Sitting, Cuff Size: Normal)   Pulse 67   Temp 98.2 F (36.8 C) (Oral)   Ht  (1.651 m)   Wt 189 lb 12.8 oz (86.1 kg)   SpO2 97%   BMI 31.58 kg/m  General appearance: alert, cooperative, appears stated age and no distress Head: Normocephalic, without obvious abnormality, atraumatic Eyes: conjunctivae/corneas clear. PERRL, EOM's intact. Fundi benign. Ears: abnormal TM left ear - + rupture-- healing, no d/c   + changes c/w psoriasis Nose: Nares normal. Septum midline. Mucosa normal. No drainage or sinus tenderness. Throat: lips, mucosa, and tongue normal; teeth and gums normal Neck: no adenopathy, no carotid bruit, no JVD, supple, symmetrical, trachea midline and thyroid not enlarged, symmetric, no tenderness/mass/nodules Back: symmetric, no curvature. ROM normal. No CVA tenderness. Lungs: clear to auscultation bilaterally Breasts: normal appearance, no masses or tenderness Heart: regular rate and rhythm, S1, S2 normal, no murmur, click, rub or  gallop Abdomen: soft, non-tender; bowel sounds normal; no masses,  no organomegaly Pelvic: cervix normal in appearance, external genitalia normal, no adnexal masses or tenderness, no cervical motion tenderness, rectovaginal septum normal, uterus normal size, shape, and consistency, vagina normal without discharge and pap done-- difficult to insert brush into cervix  Extremities: extremities normal, atraumatic, no cyanosis or edema Pulses: 2+ and symmetric Skin: Skin color, texture, turgor normal. No rashes or lesions Lymph nodes: Cervical, supraclavicular, and axillary nodes normal. Neurologic: Alert and oriented X 3, normal strength and tone. Normal symmetric reflexes. Normal coordination and gait    Assessment:    Healthy female exam.      Plan:    ghm utd Check labs  See After Visit Summary for Counseling Recommendations     1. Preventative health care See above - CBC with Differential/Platelet - Lipid panel - TSH - Comprehensive metabolic panel - POCT Urinalysis Dipstick (Automated)  2. Essential hypertension Well controlled, no changes to meds. Encouraged heart healthy diet such as the DASH diet and exercise as tolerated.  - CBC with Differential/Platelet - Lipid panel - TSH - Comprehensive metabolic panel - POCT Urinalysis Dipstick (Automated) - metoprolol succinate (TOPROL-XL) 100 MG 24 hr tablet; Take 1 tablet (100 mg total) by mouth daily. Take with or immediately following a meal.  Dispense: 90 tablet; Refill: 3 - amLODipine (NORVASC) 5 MG tablet; Take 1 tablet (5 mg total) by  mouth daily.  Dispense: 90 tablet; Refill: 3  3. Hyperlipidemia LDL goal <100 Encouraged heart healthy diet, increase exercise, avoid trans fats, consider a krill oil cap daily - CBC with Differential/Platelet - Lipid panel - TSH - Comprehensive metabolic panel - POCT Urinalysis Dipstick (Automated)  4. Gastroesophageal reflux disease, esophagitis presence not specified stable -  omeprazole (PRILOSEC) 40 MG capsule; Take 1 capsule (40 mg total) by mouth daily.  Dispense: 90 capsule; Refill: 3  5. Generalized anxiety disorder stable - ALPRAZolam (XANAX) 0.25 MG tablet; Take 1 tablet (0.25 mg total) by mouth 3 (three) times daily as needed for anxiety.  Dispense: 30 tablet; Refill: 2  6. Encounter for other general counseling or advice on contraception  - medroxyPROGESTERone (DEPO-PROVERA) 150 MG/ML injection; AS DIRECTED EVERY 3 MONTHS  Dispense: 1 mL; Refill: 3  7. Other specified hearing loss of left ear, unspecified hearing status on contralateral side  - Ambulatory referral to ENT  8. Ruptured ear drum, left  - Ambulatory referral to ENT  9. Psoriasis Per derm  10. Hyperlipidemia, unspecified hyperlipidemia type Encouraged heart healthy diet, increase exercise, avoid trans fats, consider a krill oil cap daily

## 2017-02-05 ENCOUNTER — Other Ambulatory Visit: Payer: Self-pay | Admitting: *Deleted

## 2017-02-05 ENCOUNTER — Telehealth: Payer: Self-pay | Admitting: Family Medicine

## 2017-02-05 LAB — URINE CULTURE
MICRO NUMBER: 80979295
RESULT: NO GROWTH
SPECIMEN QUALITY:: ADEQUATE

## 2017-02-05 NOTE — Telephone Encounter (Signed)
We have received the fax for this and the order was signed and faxed to [phone]

## 2017-02-05 NOTE — Telephone Encounter (Signed)
Caller name:[facility] Relationship to patient: Can be reached:[phone] Pharmacy:  Reason for call:Calling regarding refill on depo, patient is requesting to have prefilled syring, please advise. REF# [id number]

## 2017-02-11 ENCOUNTER — Other Ambulatory Visit: Payer: Self-pay | Admitting: Family Medicine

## 2017-02-11 LAB — CYTOLOGY - PAP
Bacterial vaginitis: POSITIVE — AB
CANDIDA VAGINITIS: NEGATIVE
CHLAMYDIA, DNA PROBE: NEGATIVE
Diagnosis: UNDETERMINED — AB
HPV (WINDOPATH): NOT DETECTED
NEISSERIA GONORRHEA: NEGATIVE
TRICH (WINDOWPATH): NEGATIVE

## 2017-02-11 MED ORDER — METRONIDAZOLE 0.75 % VA GEL: 1.0000 | Freq: Every day | VAGINAL | 0 refills | Status: DC

## 2017-02-18 ENCOUNTER — Ambulatory Visit: Payer: 59 | Admitting: Obstetrics & Gynecology

## 2017-03-25 NOTE — Progress Notes (Signed)
Cardiology Office Note    Date:  03/26/2017   ID:  [name] Ramos, DOB 1966/05/01, MRN [id number]  PCP:  Donato Schultz, DO  Cardiologist: Lesleigh Noe, MD   Chief Complaint  Patient presents with  . Follow-up    History of Present Illness:  [name] Ramos is a 51 y.o. female  follow-up of premature ventricular contractions, left bundle branch block, family history of coronary artery disease, and atypical chest pain.  Has a family history of coronary artery disease.she has not had recurrent chest discomfort since I saw herlast year. She is very active in her job working for [employer]. She continues to smoke cigarettes.   Past Medical History:  Diagnosis Date  . Alcohol abuse    recovering alcoholic  . Allergic urticaria   . Anxiety   . Cholecystitis, acute   . Congenital anomaly of neck   . Depression   . Dyslipidemia    Followed by PCP  . Gastroparesis   . GERD (gastroesophageal reflux disease)   . Hematuria   . Hiatal hernia    with reflux  . Hypertension    stress test 11/2010 normal  . Kidney stone   . Obesity   . PVC's (premature ventricular contractions)    Controlled on beta blocker therapy;  Echo 9/10: EF 55-60%, grade 1 diastolic dysfunction, mild MR, mild LAE  . Thyroid nodule    hx  . Tinea corporis   . Unspecified contraceptive management   . Urinary tract infection     Past Surgical History:  Procedure Laterality Date  . leep surgery     mid 20's  . TONSILLECTOMY      Current Medications: Outpatient Medications Prior to Visit  Medication Sig Dispense Refill  . ALPRAZolam (XANAX) 0.25 MG tablet Take 1 tablet (0.25 mg total) by mouth 3 (three) times daily as needed for anxiety. 30 tablet 2  . amLODipine (NORVASC) 5 MG tablet Take 1 tablet (5 mg total) by mouth daily. 90 tablet 3  . aspirin 81 MG tablet Take 81 mg by mouth daily.    . clobetasol ointment (TEMOVATE) 0.05 % Apply to affected area every night for 4 weeks,  then every other day for 4 weeks and then twice a week 60 g 5  . cyanocobalamin (,VITAMIN B-12,) 1000 MCG/ML injection Inject 1 mL (1,000 mcg total) into the muscle once. 4 mL 0  . cyclobenzaprine (FLEXERIL) 10 MG tablet Take 1 tablet (10 mg total) by mouth 3 (three) times daily as needed for muscle spasms. 30 tablet 0  . medroxyPROGESTERone (DEPO-PROVERA) 150 MG/ML injection AS DIRECTED EVERY 3 MONTHS 1 mL 3  . metoprolol succinate (TOPROL-XL) 100 MG 24 hr tablet Take 1 tablet (100 mg total) by mouth daily. Take with or immediately following a meal. 90 tablet 3  . omeprazole (PRILOSEC) 40 MG capsule Take 1 capsule (40 mg total) by mouth daily. 90 capsule 3  . metroNIDAZOLE (METROGEL) 0.75 % vaginal gel Place 1 Applicatorful vaginally at bedtime. Use for 5 days (Patient not taking: Reported on 03/26/2017) 70 g 0  . terconazole (TERAZOL 7) 0.4 % vaginal cream Place 1 applicator vaginally at bedtime. Use for seven days (Patient not taking: Reported on 02/04/2017) 45 g 0   No facility-administered medications prior to visit.      Allergies:   Ace inhibitors; Lisinopril; and Piroxicam   Social History   Social History  . Marital status: Single    Spouse name:  N/A  . Number of children: N/A  . 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 Smoking status: Current Every Day Smoker    Packs/day: 0.50    Years: 24.00    Types: Cigarettes    Last attempt to quit: 07/02/2008  . Smokeless tobacco: Never Used  . Alcohol use No  . Drug use: No  . Sexual activity: Not Currently    Partners: Male   Other Topics Concern  . None   Social History Narrative   Exercising---walking dog daily     Family History:  The patient's family history includes Breast cancer in her mother; Colon polyps in her mother; Coronary artery disease in her other; Diabetes in her maternal grandmother and mother; Heart attack (age of onset: 29) in her  maternal grandmother; Hypertension in her other; Irritable bowel syndrome in her mother; Liver disease in her mother; Lung cancer in her maternal grandmother, maternal uncle, and paternal grandmother; Thyroid disease in her mother.   ROS:   Please see the history of present illness.    She works a job that requires motion and physical activity. No limitations.  All other systems reviewed and are negative.   PHYSICAL EXAM:   VS:  BP 140/80   Pulse 67   Ht 5\' 5"  (1.651 m)   Wt 198 lb 6.4 oz (90 kg)   BMI 33.02 kg/m    GEN: Well nourished, well developed, in no acute distress  HEENT: normal  Neck: no JVD, carotid bruits, or masses Cardiac: RRR; no murmurs, rubs, or gallops,no edema  Respiratory:  clear to auscultation bilaterally, normal work of breathing GI: soft, nontender, nondistended, + BS MS: no deformity or atrophy  Skin: warm and dry, no rash Neuro:  Alert and Oriented x 3, Strength and sensation are intact Psych: euthymic mood, full affect  Wt Readings from Last 3 Encounters:  03/26/17 198 lb 6.4 oz (90 kg)  02/04/17 189 lb 12.8 oz (86.1 kg)  12/03/16 187 lb (84.8 kg)      Studies/Labs Reviewed:   EKG:  EKG  normal sinus rhythm with normal overall appearance.  Recent Labs: 02/04/2017: ALT 18; BUN 11; Creatinine, Ser 0.81; Hemoglobin 14.3; Platelets 232.0; Potassium 3.8; Sodium 139; TSH 0.90   Lipid Panel    Component Value Date/Time   CHOL 207 (H) 02/04/2017 0940   TRIG 65.0 02/04/2017 0940   HDL 46.80 02/04/2017 0940   CHOLHDL 4 02/04/2017 0940   VLDL 13.0 02/04/2017 0940   LDLCALC 147 (H) 02/04/2017 0940   LDLDIRECT 148.1 01/20/2008 0912    Additional studies/ records that were reviewed today include:  none    ASSESSMENT:    1. Left bundle branch block   2. Essential hypertension   3. PVC (premature ventricular contraction)   4. Hyperlipidemia with target LDL less than 100      PLAN:  In order of problems listed above:  1. Normal conduction  on the most current EKG. 2. Adequate blood pressure control. Target 140/90 mmHg or less. 3. Resolved. 4. Encouraged consideration of primary prevention to LDL less than 100. Most recent LDL was 150.  When necessary follow-up or in one year. Continue an approach that encourages primary prevention.  Medication Adjustments/Labs and Tests Ordered: Current medicines are reviewed at length with the patient today.  Concerns regarding medicines are outlined above.  Medication changes, Labs and Tests ordered today are listed  in the Patient Instructions below. Patient Instructions  Medication Instructions:  Your physician recommends that you continue on your current medications as directed. Please refer to the Current Medication list given to you today.   Labwork: None  Testing/Procedures: None  Follow-Up: Your physician wants you to follow-up in: 1 year with Dr. Katrinka BlazingSmith.  You will receive a reminder letter in the mail two months in advance. If you don't receive a letter, please call our office to schedule the follow-up appointment.   Any Other Special Instructions Will Be Listed Below (If Applicable).     If you need a refill on your cardiac medications before your next appointment, please call your pharmacy.      Signed, Lesleigh NoeHenry W Barabara Motz III, MD  03/26/2017 8:43 AM    [facility] [address], [place], [place]  [address] Phone: [phone]; Fax: [phone]

## 2017-03-26 ENCOUNTER — Ambulatory Visit (INDEPENDENT_AMBULATORY_CARE_PROVIDER_SITE_OTHER): Payer: 59 | Admitting: Obstetrics & Gynecology

## 2017-03-26 ENCOUNTER — Encounter: Payer: Self-pay | Admitting: Interventional Cardiology

## 2017-03-26 ENCOUNTER — Encounter: Payer: Self-pay | Admitting: Obstetrics & Gynecology

## 2017-03-26 ENCOUNTER — Ambulatory Visit (INDEPENDENT_AMBULATORY_CARE_PROVIDER_SITE_OTHER): Payer: 59 | Admitting: Interventional Cardiology

## 2017-03-26 VITALS — BP 138/82 | HR 63 | Wt 197.0 lb

## 2017-03-26 VITALS — BP 140/80 | HR 67 | Ht 65.0 in | Wt 198.4 lb

## 2017-03-26 DIAGNOSIS — I10 Essential (primary) hypertension: Secondary | ICD-10-CM | POA: Diagnosis not present

## 2017-03-26 DIAGNOSIS — E785 Hyperlipidemia, unspecified: Secondary | ICD-10-CM

## 2017-03-26 DIAGNOSIS — I447 Left bundle-branch block, unspecified: Secondary | ICD-10-CM

## 2017-03-26 DIAGNOSIS — Z8619 Personal history of other infectious and parasitic diseases: Secondary | ICD-10-CM | POA: Diagnosis not present

## 2017-03-26 DIAGNOSIS — I493 Ventricular premature depolarization: Secondary | ICD-10-CM | POA: Diagnosis not present

## 2017-03-26 DIAGNOSIS — L900 Lichen sclerosus et atrophicus: Secondary | ICD-10-CM

## 2017-03-26 NOTE — Patient Instructions (Signed)

## 2017-03-26 NOTE — Progress Notes (Signed)
History:  51 y.o. G3P0030 here today for f/u of vaginal irritation, genital wart and lichen sclerosos. She reports that her sx seem to be improved at present. She is currently using the Clobetasol once per week.   She denies itching or dryness currently.   The following portions of the patient's history were reviewed and updated as appropriate: allergies, current medications, past family history, past medical history, past social history, past surgical history and problem list.  Review of Systems:  Pertinent items are noted in HPI.   Objective:  Physical Exam Blood pressure 138/82, pulse 63, weight 197 lb (89.4 kg).  CONSTITUTIONAL: Well-developed, well-nourished female in no acute distress.  HENT:  Normocephalic, atraumatic EYES: Conjunctivae and EOM are normal. No scleral icterus.  NECK: Normal range of motion SKIN: Skin is warm and dry. No rash noted. Not diaphoretic.No pallor. NEUROLGIC: Alert and oriented to person, place, and time. Normal coordination.  Pelvic: Normal appearing external genitalia- some hypopigmentation from the lichen scleroses o/w normal appearing vaginal mucosa and cervix.  Normal discharge.  Small uterus, no other palpable masses, no uterine or adnexal tenderness  Labs and Imaging No results found.  Assessment & Plan:  Lichen scleroses Genital wart- removed Vaginal irritation from [place]  Cont clobetasol 2-3x/week F/u in 1 year or sooner prn  Total face-to-face time with patient was 10 min.  Greater than 50% was spent in counseling and coordination of care with the patient.  Pesach Frisch L. Harraway-Smith, M.D., Evern CoreFACOG

## 2017-03-26 NOTE — Progress Notes (Signed)
Pt states she is in office for f/u bacterial infection. Pt states she is doing well today, no complaints.

## 2017-04-19 ENCOUNTER — Other Ambulatory Visit: Payer: Self-pay

## 2017-04-19 ENCOUNTER — Encounter: Payer: Self-pay | Admitting: *Deleted

## 2017-04-19 ENCOUNTER — Emergency Department (INDEPENDENT_AMBULATORY_CARE_PROVIDER_SITE_OTHER): Payer: 59

## 2017-04-19 ENCOUNTER — Emergency Department
Admission: EM | Admit: 2017-04-19 | Discharge: 2017-04-19 | Disposition: A | Discharge status: Home / Self Care | Payer: 59 | Source: Home / Self Care | Attending: Family Medicine | Admitting: Family Medicine

## 2017-04-19 DIAGNOSIS — M5416 Radiculopathy, lumbar region: Secondary | ICD-10-CM

## 2017-04-19 DIAGNOSIS — IMO0001 Reserved for inherently not codable concepts without codable children: Secondary | ICD-10-CM

## 2017-04-19 MED ORDER — PREDNISONE 20 MG PO TABS: ORAL_TABLET | ORAL | 0 refills | Status: DC

## 2017-04-19 NOTE — ED Triage Notes (Signed)
Pt c/o RT side LBP that radiates down her RT leg x 9 days after lifting a mini fridge. She has taken muscle relaxer, IBF and applied heat and Lidocaine patches.

## 2017-04-19 NOTE — Discharge Instructions (Signed)
Apply ice pack for 20 to 30 minutes, 3 to 4 times daily  Continue until pain decreases.  May continue Flexeril 5mg  at bedtime.

## 2017-04-19 NOTE — ED Provider Notes (Signed)
[facility]    CSN: [id number] Arrival date & time: 04/19/17  4098     History   Chief Complaint Chief Complaint  Patient presents with  . Back Pain    HPI Makinzy Cleere is a 51 y.o. female.   Nine days ago patient lifted a small refrigerator and developed soreness in her lower back later.  Five days ago she reached over to pick up a gallon milk jug on her car seat and heard/felt a popping sensation in her right lower back.  She has been applying heat and applying lidocaine patches, as well as taking ibuprofen and Flexeril.  Today she developed pain radiating down her right leg.   She denies bowel or bladder dysfunction, and no saddle numbness.    The history is provided by the patient.  Back Pain  Location:  Lumbar spine and gluteal region Quality:  Aching Radiates to:  R posterior upper leg and R thigh Pain severity:  Moderate Pain is:  Same all the time Onset quality:  Sudden Duration:  9 days Timing:  Constant Progression:  Worsening Chronicity:  New Context: lifting heavy objects   Relieved by:  Nothing Worsened by:  Movement, bending and ambulation Ineffective treatments:  Muscle relaxants, NSAIDs and heating pad Associated symptoms: paresthesias   Associated symptoms: no abdominal pain, no abdominal swelling, no bladder incontinence, no bowel incontinence, no dysuria, no fever, no leg pain, no numbness, no pelvic pain, no perianal numbness, no tingling and no weakness     Past Medical History:  Diagnosis Date  . Alcohol abuse    recovering alcoholic  . Allergic urticaria   . Anxiety   . Cholecystitis, acute   . Congenital anomaly of neck   . Depression   . Dyslipidemia    Followed by PCP  . Gastroparesis   . GERD (gastroesophageal reflux disease)   . Hematuria   . Hiatal hernia    with reflux  . Hypertension    stress test 11/2010 normal  . Kidney stone   . Obesity   . PVC's (premature ventricular contractions)    Controlled on beta  blocker therapy;  Echo 9/10: EF 55-60%, grade 1 diastolic dysfunction, mild MR, mild LAE  . Thyroid nodule    hx  . Tinea corporis   . Unspecified contraceptive management   . Urinary tract infection     Patient Active Problem List   Diagnosis Date Noted  . Psoriasis 02/04/2017  . Left bundle branch block 09/02/2015  . Chest pain 08/31/2015  . Ingrown nail 12/04/2014  . Pain in toe of left foot 12/04/2014  . Obesity (BMI 30-39.9) 05/01/2013  . Tremor 04/15/2012  . Nausea and vomiting 04/01/2012  . Depression with anxiety 01/25/2012  . Alcohol abuse 01/22/2012  . PVC (premature ventricular contraction) 09/09/2011  . Hordeolum externum of left upper eyelid 12/15/2010  . Hair loss 10/16/2010  . Lymph node enlargement 10/16/2010  . Eczema 10/16/2010  . VAGINITIS 04/22/2010  . HEMATURIA, HX OF 04/22/2010  . Hyperlipidemia 04/10/2010  . WART, RIGHT HAND 03/21/2010  . DIZZINESS 02/27/2009  . FECAL INCONTINENCE 02/21/2009  . HIATAL HERNIA WITH REFLUX, HX OF 02/21/2009  . CHOLECYSTITIS - ACUTE 08/07/2008  . ALLERGIC URTICARIA 03/16/2008  . TINEA CORPORIS 01/20/2008  . GERD 09/02/2007  . DEPRESSION 02/24/2007  . Essential hypertension 01/21/2007  . HEMATURIA 01/21/2007  . 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 THYROID NODULE, HX OF 01/21/2007    Past Surgical History:  Procedure Laterality Date  .  leep surgery     mid 20's  . TONSILLECTOMY      OB History    Gravida Para Term Preterm AB Living   3       3     SAB TAB Ectopic Multiple Live Births     3     0       Home Medications    Prior to Admission medications   Medication Sig Start Date End Date Taking? Authorizing Provider  ALPRAZolam (XANAX) 0.25 MG tablet Take 1 tablet (0.25 mg total) by mouth 3 (three) times daily as needed for anxiety. 02/04/17  Yes Seabron SpatesLowne Chase, Yvonne R, DO  amLODipine (NORVASC) 5 MG tablet Take 1 tablet (5 mg total) by mouth daily. 02/04/17  Yes Donato SchultzLowne Chase, Yvonne R, DO  aspirin 81 MG  chewable tablet Chew daily by mouth.   Yes [provider]  aspirin 81 MG tablet Take 81 mg by mouth daily.   Yes [provider]  clobetasol ointment (TEMOVATE) 0.05 % Apply to affected area every night for 4 weeks, then every other day for 4 weeks and then twice a week 01/23/16  Yes Willodean RosenthalHarraway-Smith, Carolyn, MD  cyclobenzaprine (FLEXERIL) 10 MG tablet Take 1 tablet (10 mg total) by mouth 3 (three) times daily as needed for muscle spasms. 08/20/16  Yes Seabron SpatesLowne Chase, Yvonne R, DO  medroxyPROGESTERone (DEPO-PROVERA) 150 MG/ML injection AS DIRECTED EVERY 3 MONTHS 02/04/17  Yes Seabron SpatesLowne Chase, Yvonne R, DO  metoprolol succinate (TOPROL-XL) 100 MG 24 hr tablet Take 1 tablet (100 mg total) by mouth daily. Take with or immediately following a meal. 02/04/17 05/05/17 Yes Lowne Irish Eldershase, Yvonne R, DO  omeprazole (PRILOSEC) 40 MG capsule Take 1 capsule (40 mg total) by mouth daily. 02/04/17  Yes Seabron SpatesLowne Chase, Yvonne R, DO  cyanocobalamin (,VITAMIN B-12,) 1000 MCG/ML injection Inject 1 mL (1,000 mcg total) into the muscle once. 12/06/14   Donato SchultzLowne Chase, Yvonne R, DO  predniSONE (DELTASONE) 20 MG tablet Take one tab by mouth twice daily for 5 days, then one daily for 3 days. Take with food. 04/19/17   Lattie HawBeese, Zakir Henner A, MD    Family History Family History  Problem Relation Age of Onset  . Diabetes Mother   . Liver disease Mother   . Irritable bowel syndrome Mother   . Colon polyps Mother   . Breast cancer Mother   . Thyroid disease Mother   . Hypertension Mother   . Hyperlipidemia Father   . Hypertension Father   . Lung cancer Maternal Uncle   . Lung cancer Maternal Grandmother   . Heart attack Maternal Grandmother 48  . Diabetes Maternal Grandmother   . Lung cancer Paternal Grandmother   . Coronary artery disease Other        female 1st degree relative 49 yo  . Hypertension Other   . Colon cancer Neg Hx   . Stomach cancer Neg Hx     Social History Social History   Tobacco Use  . Smoking  status: Current Every Day Smoker    Packs/day: 1.00    Years: 24.00    Pack years: 24.00    Types: Cigarettes    Last attempt to quit: 07/02/2008    Years since quitting: 8.8  . Smokeless tobacco: Never Used  Substance Use Topics  . Alcohol use: No    Alcohol/week: 0.0 oz  . Drug use: No     Allergies   Ace inhibitors; Lisinopril; and Piroxicam   Review of  Systems Review of Systems  Constitutional: Negative for fever.  Gastrointestinal: Negative for abdominal pain and bowel incontinence.  Genitourinary: Negative for bladder incontinence, dysuria and pelvic pain.  Musculoskeletal: Positive for back pain.  Neurological: Positive for paresthesias. Negative for tingling, weakness and numbness.  All other systems reviewed and are negative.    Physical Exam Triage Vital Signs ED Triage Vitals  Enc Vitals Group     BP 04/19/17 0909 (!) 150/91     Pulse Rate 04/19/17 0909 72     Resp 04/19/17 0909 16     Temp 04/19/17 0909 98.5 F (36.9 C)     Temp Source 04/19/17 0909 Oral     SpO2 --      Weight 04/19/17 0910 196 lb (88.9 kg)     Height 04/19/17 0910 5\' 5"  (1.651 m)     Head Circumference --      Peak Flow --      Pain Score 04/19/17 0910 2     Pain Loc --      Pain Edu? --      Excl. in GC? --    No data found.  Updated Vital Signs BP (!) 150/91 (BP Location: Right Arm) Comment (BP Location): Large cuff  Pulse 72   Temp 98.5 F (36.9 C) (Oral)   Resp 16   Ht 5\' 5"  (1.651 m)   Wt 196 lb (88.9 kg)   BMI 32.62 kg/m   Visual Acuity Right Eye Distance:   Left Eye Distance:   Bilateral Distance:    Right Eye Near:   Left Eye Near:    Bilateral Near:     Physical Exam  Constitutional: She appears well-developed and well-nourished. No distress.  HENT:  Head: Normocephalic.  Right Ear: External ear normal.  Left Ear: External ear normal.  Nose: Nose normal.  Mouth/Throat: Oropharynx is clear and moist.  Eyes: Pupils are equal, round, and reactive to  light.  Neck: Normal range of motion.  Cardiovascular: Normal heart sounds.  Pulmonary/Chest: Breath sounds normal.  Abdominal: There is no tenderness.  Musculoskeletal:       Lumbar back: She exhibits decreased range of motion and tenderness.       Back:  Back:  Range of motion decreased. Can heel/toe walk and squat without difficulty.Tenderness in the midline and right paraspinous muscles from L4 to Sacral area.  Straight leg raising test is negative.  Sitting knee extension test is negative.  Strength and sensation in the lower extremities is normal.  Patellar and achilles reflexes are normal   Tenderness to palpation over right SI joint.  Neurological: She is alert.  Skin: Skin is warm and dry. No rash noted.  Nursing note and vitals reviewed.    UC Treatments / Results  Labs (all labs ordered are listed, but only abnormal results are displayed) Labs Reviewed - No data to display  EKG  EKG Interpretation None       Radiology Dg Lumbar Spine Complete  Result Date: 04/19/2017 CLINICAL DATA:  Lumbago with right-sided radicular symptoms EXAM: LUMBAR SPINE - COMPLETE 4+ VIEW COMPARISON:  None. FINDINGS: Frontal, lateral, spot lumbosacral lateral, and bilateral oblique views were obtained. There are 5 non-rib-bearing lumbar type vertebral bodies. There is no fracture or spondylolisthesis. There is mild disc space narrowing at L2-3 and L3-4. There is facet osteoarthritic change at L5-S1 bilaterally. There is aortic atherosclerosis. IMPRESSION: Areas of relatively mild osteoarthritic change. No fracture or spondylolisthesis. There is aortic atherosclerosis. Electronically Signed  By: Bretta BangWilliam  Woodruff III M.D.   On: 04/19/2017 10:00    Procedures Procedures (including critical care time)  Medications Ordered in UC Medications - No data to display   Initial Impression / Assessment and Plan / UC Course  I have reviewed the triage vital signs and the nursing notes.  Pertinent  labs & imaging results that were available during my care of the patient were reviewed by me and considered in my medical decision making (see chart for details).    Begin prednisone burst/taper. Apply ice pack for 20 to 30 minutes, 3 to 4 times daily  Continue until pain decreases.  May continue Flexeril 5mg  at bedtime. Followup with Dr. Rodney Langtonhomas Thekkekandam or Dr. Clementeen GrahamEvan Corey ([facility]) if not improving about two weeks.     Final Clinical Impressions(s) / UC Diagnoses   Final diagnoses:  Radicular pain of right lower back    ED Discharge Orders        Ordered    predniSONE (DELTASONE) 20 MG tablet     04/19/17 1018           Lattie HawBeese, Adelaida Reindel A, MD 04/22/17 1659

## 2017-04-21 ENCOUNTER — Telehealth: Payer: Self-pay

## 2017-04-21 DIAGNOSIS — M5430 Sciatica, unspecified side: Secondary | ICD-10-CM

## 2017-04-21 NOTE — Telephone Encounter (Signed)
Please advise 

## 2017-04-21 NOTE — Telephone Encounter (Signed)
Copied from CRM [phone]. Topic: Referral - Request >> Apr 21, 2017 12:10 PM Laural BenesJohnson, Louisianahiquita C wrote: Reason for CRM: pt called in because she said that her sciatic nerve is causing her pain and she would like a referral to sports medicine ([facility]) please advise.

## 2017-04-23 NOTE — Telephone Encounter (Signed)
Ok to refer.

## 2017-04-26 NOTE — Telephone Encounter (Signed)
Referral placed.

## 2017-06-23 ENCOUNTER — Telehealth: Payer: Self-pay | Admitting: Family Medicine

## 2017-06-23 ENCOUNTER — Other Ambulatory Visit: Payer: Self-pay | Admitting: Family Medicine

## 2017-06-23 DIAGNOSIS — I10 Essential (primary) hypertension: Secondary | ICD-10-CM

## 2017-06-23 NOTE — Telephone Encounter (Signed)
Order is in.

## 2017-06-23 NOTE — Telephone Encounter (Signed)
Copied from CRM #[id number]. Topic: General - Other >> Jun 23, 2017 11:37 AM Windy KalataMichael, Stiven Kaspar L, NT wrote: Patient would like to come in 1/24 or 1/25 to get lab work done in regards to getting cholesterol and kidney check. She states her cardiologist is wanting her to get cholesterol checked and her father has went into kidney failure due to BP medication. Please advise and contact patient when lab orders are put in.

## 2017-06-24 NOTE — Telephone Encounter (Signed)
Patient notified

## 2017-06-25 ENCOUNTER — Other Ambulatory Visit (INDEPENDENT_AMBULATORY_CARE_PROVIDER_SITE_OTHER): Payer: Federal, State, Local not specified - PPO

## 2017-06-25 DIAGNOSIS — I10 Essential (primary) hypertension: Secondary | ICD-10-CM | POA: Diagnosis not present

## 2017-06-25 LAB — LIPID PANEL
CHOLESTEROL: 199 mg/dL (ref 0–200)
HDL: 42.2 mg/dL (ref >39.00)
LDL Cholesterol: 143 mg/dL — ABNORMAL HIGH (ref 0–99)
NonHDL: 157.01
Total CHOL/HDL Ratio: 5
Triglycerides: 72 mg/dL (ref 0.0–149.0)
VLDL: 14.4 mg/dL (ref 0.0–40.0)

## 2017-06-25 LAB — COMPREHENSIVE METABOLIC PANEL
ALK PHOS: 51 U/L (ref 39–117)
ALT: 19 U/L (ref 0–35)
AST: 17 U/L (ref 0–37)
Albumin: 4.2 g/dL (ref 3.5–5.2)
BUN: 12 mg/dL (ref 6–23)
CO2: 26 mEq/L (ref 19–32)
Calcium: 9.3 mg/dL (ref 8.4–10.5)
Chloride: 105 mEq/L (ref 96–112)
Creatinine, Ser: 0.89 mg/dL (ref 0.40–1.20)
GFR: 71.45 mL/min (ref >60.00)
Glucose, Bld: 104 mg/dL — ABNORMAL HIGH (ref 70–99)
POTASSIUM: 4 meq/L (ref 3.5–5.1)
Sodium: 138 mEq/L (ref 135–145)
TOTAL PROTEIN: 6.9 g/dL (ref 6.0–8.3)
Total Bilirubin: 0.6 mg/dL (ref 0.2–1.2)

## 2017-06-29 ENCOUNTER — Telehealth: Payer: Self-pay | Admitting: *Deleted

## 2017-06-29 ENCOUNTER — Encounter: Payer: Self-pay | Admitting: Family Medicine

## 2017-06-29 ENCOUNTER — Ambulatory Visit: Payer: Federal, State, Local not specified - PPO | Admitting: Family Medicine

## 2017-06-29 VITALS — BP 142/88 | HR 68 | Temp 98.1°F | Resp 16 | Ht 65.0 in | Wt 202.4 lb

## 2017-06-29 DIAGNOSIS — I10 Essential (primary) hypertension: Secondary | ICD-10-CM | POA: Diagnosis not present

## 2017-06-29 DIAGNOSIS — E785 Hyperlipidemia, unspecified: Secondary | ICD-10-CM

## 2017-06-29 MED ORDER — ATORVASTATIN CALCIUM 10 MG PO TABS: 10.0000 mg | ORAL_TABLET | Freq: Every day | ORAL | 1 refills | Status: DC

## 2017-06-29 MED ORDER — ATENOLOL 100 MG PO TABS: 100.0000 mg | ORAL_TABLET | Freq: Every day | ORAL | 3 refills | Status: DC

## 2017-06-29 NOTE — Telephone Encounter (Signed)
Copied from CRM #[id number]. Topic: General - Other >> Jun 23, 2017 11:37 AM Windy KalataMichael, Taylor L, NT wrote: Patient would like to come in 1/24 or 1/25 to get lab work done in regards to getting cholesterol and kidney check. She states her cardiologist is wanting her to get cholesterol checked and her father has went into kidney failure due to BP medication. Please advise and contact patient when lab orders are put in.   >> Jun 29, 2017 10:11 AM Cipriano BunkerLambe, Annette S wrote: Pt. Still wants appt to discuss Cholesterol

## 2017-06-29 NOTE — Telephone Encounter (Signed)
Patient seen today

## 2017-06-29 NOTE — Assessment & Plan Note (Signed)
Tolerating statin, encouraged heart healthy diet, avoid trans fats, minimize simple carbs and saturated fats. Increase exercise as tolerated 

## 2017-06-29 NOTE — Assessment & Plan Note (Addendum)
Poorly controlled will alter medications, encouraged DASH diet, minimize caffeine and obtain adequate sleep. Report concerning symptoms and follow up as directed and as needed  Restart atenolol in place of toprol con't norvasc F/u 2-3 weeks for bp check

## 2017-06-29 NOTE — Patient Instructions (Signed)

## 2017-06-29 NOTE — Progress Notes (Signed)
Patient ID: [name] Ramos, female   DOB: 10-17-1965, 52 y.o.   MRN: [id number]     Subjective:  I acted as a Neurosurgeon for Dr. Zola Button.  Apolonio Schneiders, CMA   Patient ID: [name] Ramos, female    DOB: 10/12/1965, 52 y.o.   MRN: [id number]  Chief Complaint  Patient presents with  . Results  . Medication Problem    change    HPI  Patient is in today to see if she could change her blood pressure medication back to atenolol and to see if she could try Zocor instead of Lipitor.  [facility] and it works perfect for her.  Patient Care Team: Zola Button, Grayling Congress, DO as PCP - General Kathleene Hazel, MD as Consulting Physician (Cardiology)   Past Medical History:  Diagnosis Date  . Alcohol abuse    recovering alcoholic  . Allergic urticaria   . Anxiety   . Cholecystitis, acute   . Congenital anomaly of neck   . Depression   . Dyslipidemia    Followed by PCP  . Gastroparesis   . GERD (gastroesophageal reflux disease)   . Hematuria   . Hiatal hernia    with reflux  . Hypertension    stress test 11/2010 normal  . Kidney stone   . Obesity   . PVC's (premature ventricular contractions)    Controlled on beta blocker therapy;  Echo 9/10: EF 55-60%, grade 1 diastolic dysfunction, mild MR, mild LAE  . Thyroid nodule    hx  . Tinea corporis   . Unspecified contraceptive management   . Urinary tract infection     Past Surgical History:  Procedure Laterality Date  . leep surgery     mid 20's  . TONSILLECTOMY      Family History  Problem Relation Age of Onset  . Diabetes Mother   . Liver disease Mother   . Irritable bowel syndrome Mother   . Colon polyps Mother   . Breast cancer Mother   . Thyroid disease Mother   . Hypertension Mother   . Hyperlipidemia Father   . Hypertension Father   . Lung cancer Maternal Uncle   . Lung cancer Maternal Grandmother   . Heart attack Maternal Grandmother 48  . Diabetes Maternal Grandmother   .  Lung cancer Paternal Grandmother   . Coronary artery disease Other        female 1st degree relative 50 yo  . Hypertension Other   . Colon cancer Neg Hx   . Stomach cancer Neg Hx     Social History   Socioeconomic History  . Marital status: Single    Spouse name: Not on file  . Number of children: Not on file  . Years of education: Not on file  . Highest education level: Not on file  Social Needs  . Financial resource strain: Not on file  . Food insecurity - worry: Not on file  . Food insecurity - inability: Not on file  . Transportation needs - medical: Not on file  . Transportation needs - non-medical: Not on file  Occupational History  . Occupation: post Public affairs consultant: [employer]  Tobacco Use  . Smoking status: Current Every Day Smoker    Packs/day: 1.00    Years: 24.00    Pack years: 24.00    Types: Cigarettes    Last attempt to quit: 07/02/2008    Years since quitting: 8.9  .  Smokeless tobacco: Never Used  Substance and Sexual Activity  . Alcohol use: No    Alcohol/week: 0.0 oz  . Drug use: No  . Sexual activity: Not Currently    Partners: Male  Other Topics Concern  . Not on file  Social History Narrative   Exercising---walking dog daily    Outpatient Medications Prior to Visit  Medication Sig Dispense Refill  . ALPRAZolam (XANAX) 0.25 MG tablet Take 1 tablet (0.25 mg total) by mouth 3 (three) times daily as needed for anxiety. 30 tablet 2  . amLODipine (NORVASC) 5 MG tablet Take 1 tablet (5 mg total) by mouth daily. 90 tablet 3  . aspirin 81 MG chewable tablet Chew daily by mouth.    Marland Kitchen. aspirin 81 MG tablet Take 81 mg by mouth daily.    . clobetasol ointment (TEMOVATE) 0.05 % Apply to affected area every night for 4 weeks, then every other day for 4 weeks and then twice a week 60 g 5  . cyanocobalamin (,VITAMIN B-12,) 1000 MCG/ML injection Inject 1 mL (1,000 mcg total) into the muscle once. 4 mL 0  . cyclobenzaprine (FLEXERIL) 10 MG tablet Take 1  tablet (10 mg total) by mouth 3 (three) times daily as needed for muscle spasms. 30 tablet 0  . medroxyPROGESTERone (DEPO-PROVERA) 150 MG/ML injection AS DIRECTED EVERY 3 MONTHS 1 mL 3  . omeprazole (PRILOSEC) 40 MG capsule Take 1 capsule (40 mg total) by mouth daily. 90 capsule 3  . metoprolol succinate (TOPROL-XL) 100 MG 24 hr tablet Take 1 tablet (100 mg total) by mouth daily. Take with or immediately following a meal. 90 tablet 3  . predniSONE (DELTASONE) 20 MG tablet Take one tab by mouth twice daily for 5 days, then one daily for 3 days. Take with food. 13 tablet 0   No facility-administered medications prior to visit.     Allergies  Allergen Reactions  . Ace Inhibitors Other (See Comments)    Numbness, tingling of lips.  . Lisinopril     Other reaction(s): Other (See Comments) other  . Piroxicam Other (See Comments)    REACTION: hives REACTION: hives    Review of Systems  Constitutional: Negative for fever and malaise/fatigue.  HENT: Negative for congestion.   Eyes: Negative for blurred vision.  Respiratory: Negative for cough and shortness of breath.   Cardiovascular: Negative for chest pain, palpitations and leg swelling.  Gastrointestinal: Negative for vomiting.  Musculoskeletal: Negative for back pain.  Skin: Negative for rash.  Neurological: Negative for loss of consciousness and headaches.       Objective:    Physical Exam  Constitutional: She is oriented to person, place, and time. She appears well-developed and well-nourished.  HENT:  Head: Normocephalic and atraumatic.  Eyes: Conjunctivae and EOM are normal.  Neck: Normal range of motion. Neck supple. No JVD present. Carotid bruit is not present. No thyromegaly present.  Cardiovascular: Normal rate, regular rhythm and normal heart sounds.  No murmur heard. Pulmonary/Chest: Effort normal and breath sounds normal. No respiratory distress. She has no wheezes. She has no rales. She exhibits no tenderness.    Musculoskeletal: She exhibits no edema.  Neurological: She is alert and oriented to person, place, and time.  Psychiatric: She has a normal mood and affect.  Nursing note and vitals reviewed.   BP (!) 142/88   Pulse 68   Temp 98.1 F (36.7 C) (Oral)   Resp 16   Ht 5\' 5"  (1.651 m)   Wt 202  lb 6.4 oz (91.8 kg)   SpO2 99%   BMI 33.68 kg/m  Wt Readings from Last 3 Encounters:  06/29/17 202 lb 6.4 oz (91.8 kg)  04/19/17 196 lb (88.9 kg)  03/26/17 197 lb (89.4 kg)   BP Readings from Last 3 Encounters:  06/29/17 (!) 142/88  04/19/17 (!) 150/91  03/26/17 138/82     Immunization History  Administered Date(s) Administered  . Td 01/21/2007  . Tdap 02/04/2017    Health Maintenance  Topic Date Due  . INFLUENZA VACCINE  12/30/2016  . HIV Screening  08/28/2035 (Originally 10/16/1982)  . PAP SMEAR  02/05/2020  . TETANUS/TDAP  02/05/2027    Lab Results  Component Value Date   WBC 7.1 02/04/2017   HGB 14.3 02/04/2017   HCT 43.3 02/04/2017   PLT 232.0 02/04/2017   GLUCOSE 104 (H) 06/25/2017   CHOL 199 06/25/2017   TRIG 72.0 06/25/2017   HDL 42.20 06/25/2017   LDLDIRECT 148.1 01/20/2008   LDLCALC 143 (H) 06/25/2017   ALT 19 06/25/2017   AST 17 06/25/2017   NA 138 06/25/2017   K 4.0 06/25/2017   CL 105 06/25/2017   CREATININE 0.89 06/25/2017   BUN 12 06/25/2017   CO2 26 06/25/2017   TSH 0.90 02/04/2017    Lab Results  Component Value Date   TSH 0.90 02/04/2017   Lab Results  Component Value Date   WBC 7.1 02/04/2017   HGB 14.3 02/04/2017   HCT 43.3 02/04/2017   MCV 95.4 02/04/2017   PLT 232.0 02/04/2017   Lab Results  Component Value Date   NA 138 06/25/2017   K 4.0 06/25/2017   CO2 26 06/25/2017   GLUCOSE 104 (H) 06/25/2017   BUN 12 06/25/2017   CREATININE 0.89 06/25/2017   BILITOT 0.6 06/25/2017   ALKPHOS 51 06/25/2017   AST 17 06/25/2017   ALT 19 06/25/2017   PROT 6.9 06/25/2017   ALBUMIN 4.2 06/25/2017   CALCIUM 9.3 06/25/2017   ANIONGAP 8  08/30/2015   GFR 71.45 06/25/2017   Lab Results  Component Value Date   CHOL 199 06/25/2017   Lab Results  Component Value Date   HDL 42.20 06/25/2017   Lab Results  Component Value Date   LDLCALC 143 (H) 06/25/2017   Lab Results  Component Value Date   TRIG 72.0 06/25/2017   Lab Results  Component Value Date   CHOLHDL 5 06/25/2017   No results found for: HGBA1C       Assessment & Plan:   Problem List Items Addressed This Visit      Unprioritized   Essential hypertension    Poorly controlled will alter medications, encouraged DASH diet, minimize caffeine and obtain adequate sleep. Report concerning symptoms and follow up as directed and as needed  Restart atenolol in place of toprol con't norvasc F/u 2-3 weeks for bp check      Relevant Medications   atorvastatin (LIPITOR) 10 MG tablet   atenolol (TENORMIN) 100 MG tablet   Other Relevant Orders   Lipid panel   Comprehensive metabolic panel   Hyperlipidemia LDL goal <100 - Primary    Tolerating statin, encouraged heart healthy diet, avoid trans fats, minimize simple carbs and saturated fats. Increase exercise as tolerated      Relevant Medications   atorvastatin (LIPITOR) 10 MG tablet   atenolol (TENORMIN) 100 MG tablet   Other Relevant Orders   Lipid panel   Comprehensive metabolic panel      I have  discontinued [place]'s metoprolol succinate and predniSONE. I am also having her start on atorvastatin and atenolol. Additionally, I am having her maintain her cyanocobalamin, aspirin, clobetasol ointment, cyclobenzaprine, omeprazole, amLODipine, ALPRAZolam, medroxyPROGESTERone, and aspirin.  Meds ordered this encounter  Medications  . atorvastatin (LIPITOR) 10 MG tablet    Sig: Take 1 tablet (10 mg total) by mouth daily.    Dispense:  90 tablet    Refill:  1  . atenolol (TENORMIN) 100 MG tablet    Sig: Take 1 tablet (100 mg total) by mouth daily.    Dispense:  90 tablet    Refill:  3     CMA served as scribe during this visit. History, Physical and Plan performed by medical provider. Documentation and orders reviewed and attested to.  Donato Schultz, DO

## 2017-07-14 ENCOUNTER — Ambulatory Visit: Payer: Federal, State, Local not specified - PPO

## 2017-08-05 ENCOUNTER — Ambulatory Visit: Payer: 59 | Admitting: Family Medicine

## 2017-08-10 ENCOUNTER — Other Ambulatory Visit: Payer: Self-pay | Admitting: Family Medicine

## 2017-08-10 DIAGNOSIS — Z3009 Encounter for other general counseling and advice on contraception: Secondary | ICD-10-CM

## 2017-08-17 ENCOUNTER — Telehealth: Payer: Self-pay | Admitting: Family Medicine

## 2017-08-17 ENCOUNTER — Other Ambulatory Visit: Payer: Self-pay | Admitting: *Deleted

## 2017-08-17 DIAGNOSIS — I10 Essential (primary) hypertension: Secondary | ICD-10-CM

## 2017-08-17 DIAGNOSIS — K219 Gastro-esophageal reflux disease without esophagitis: Secondary | ICD-10-CM

## 2017-08-17 MED ORDER — OMEPRAZOLE 40 MG PO CPDR: 40.0000 mg | DELAYED_RELEASE_CAPSULE | Freq: Every day | ORAL | 1 refills | Status: DC

## 2017-08-17 MED ORDER — AMLODIPINE BESYLATE 5 MG PO TABS: 5.0000 mg | ORAL_TABLET | Freq: Every day | ORAL | 1 refills | Status: DC

## 2017-08-17 MED ORDER — ATENOLOL 100 MG PO TABS: 100.0000 mg | ORAL_TABLET | Freq: Every day | ORAL | 0 refills | Status: DC

## 2017-08-17 NOTE — Telephone Encounter (Signed)
[facility] [place] [facility]. Previous refills were sent [facility] for a year supply on 02/04/2017. Refills for Medroxyprogesterone acetate and [facility]ipine [facility].

## 2017-08-17 NOTE — Telephone Encounter (Signed)
Copied from CRM [phone]. Topic: Quick Communication - Rx Refill/Question >> Aug 17, 2017  9:00 AM Stephannie LiSimmons, Antonis Lor L, NT wrote: Medication:  amLODipine (NORVASC) 5 MG tablet Has the patient contacted their pharmacy? { no: (Agent: If no, request that the patient contact the pharmacy for the refill. Preferred Pharmacy (with phone number or street name): [id number] [place], [place] - [address] [phone] (Phone) [phone] (Fax Agent: Please be advised that RX refills may take up to 3 business days. We ask that you follow-up with your pharmacy.

## 2017-08-17 NOTE — Telephone Encounter (Signed)
Attempted to contact pt regarding refill request; left message on [phone]

## 2017-08-17 NOTE — Telephone Encounter (Signed)
Copied from CRM [id number]. Topic: Quick Communication - See Telephone Encounter >> Aug 17, 2017  1:51 PM Terisa Starraylor, Brittany L wrote: CRM for notification. See Telephone encounter for:   08/17/17.  Patient said that [facility] went to [facility] and tried to get them to refill all her scripts and they advised her that they can not transfer them from the mail order. Patient said that she has [employer]BS now and is not required to use the mail order as she was with [employer]. She needs all new scripts for  :: omeprazole (PRILOSEC) 40 MG capsule medroxyPROGESTERone Acetate 150 MG/ML SUSY  amLODipine (NORVASC) 5 MG tablet   Please send to [id number] - [place], [place] - [address]

## 2017-08-27 IMAGING — DX DG SHOULDER 2+V*R*
3 series · 3 of 3 positions shown · non-contrast
Comparison: None.

CLINICAL DATA: Right shoulder pain.

EXAM:
RIGHT SHOULDER - 2+ VIEW

[shoulder grashey]
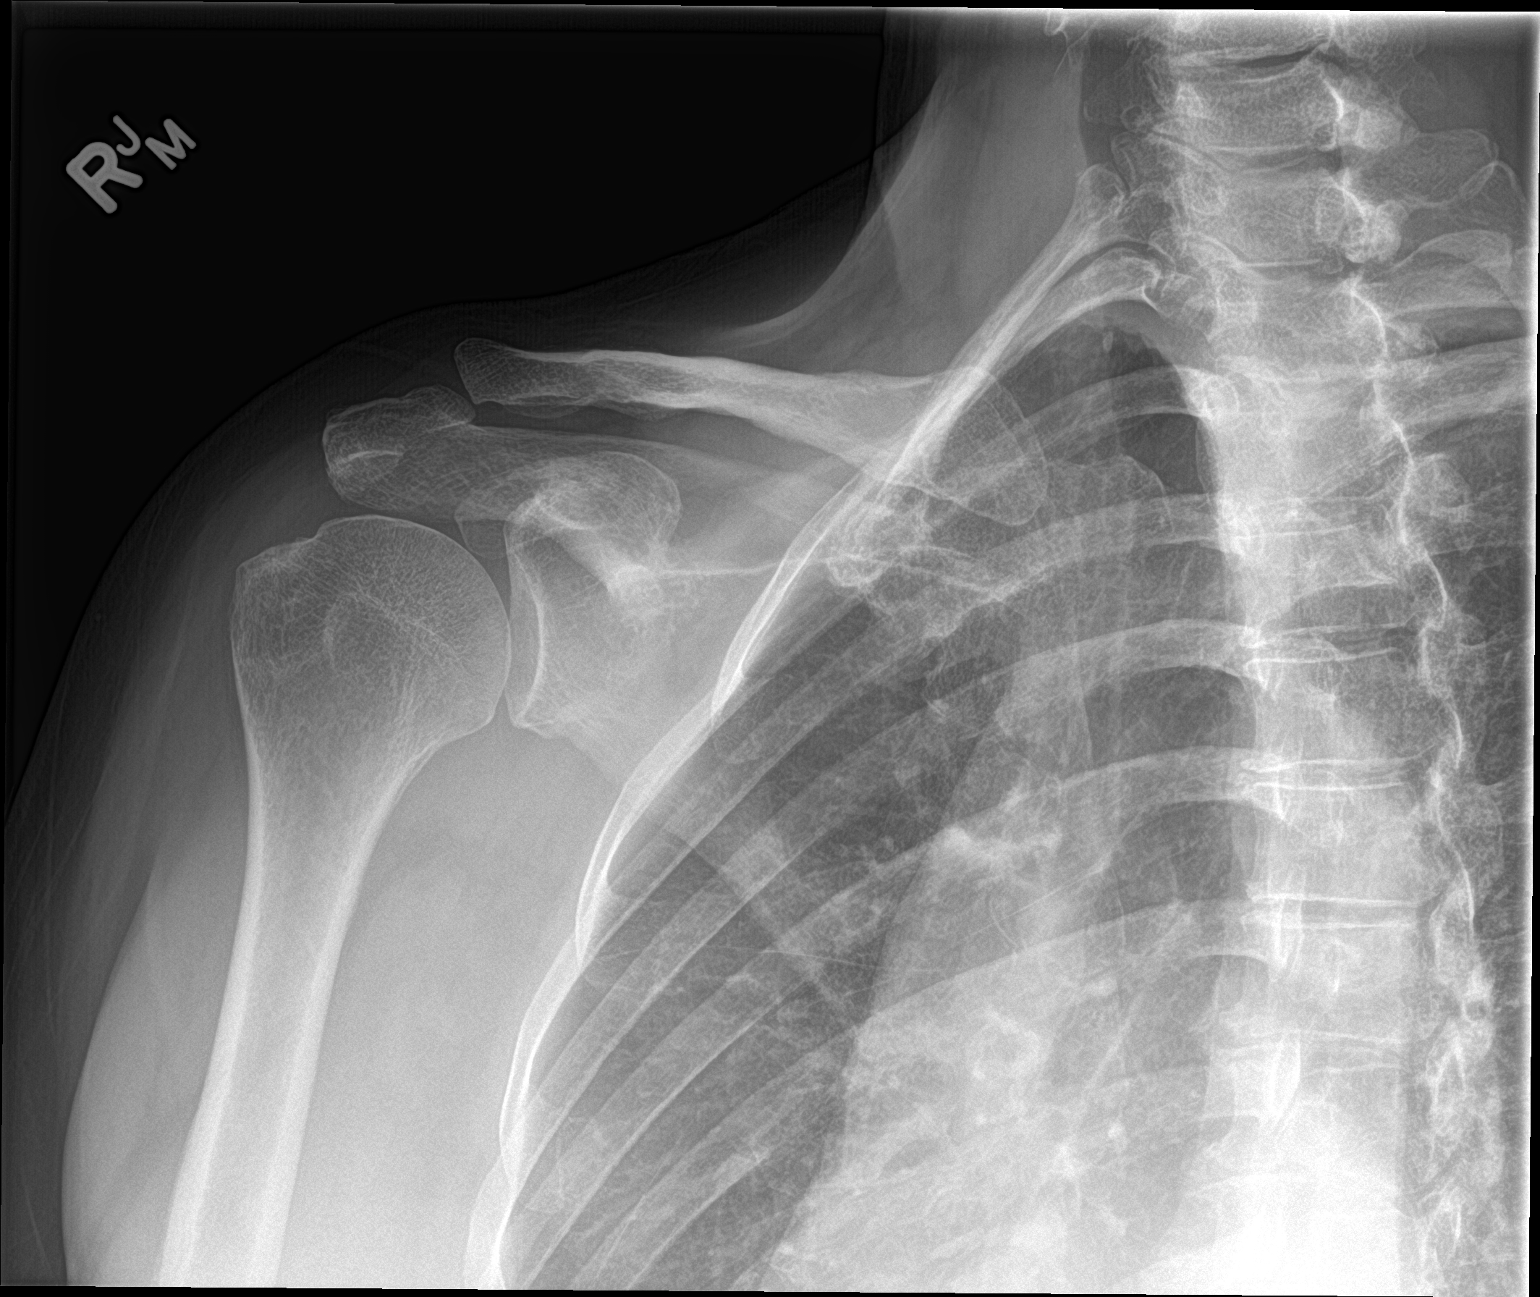

[shoulder y view]
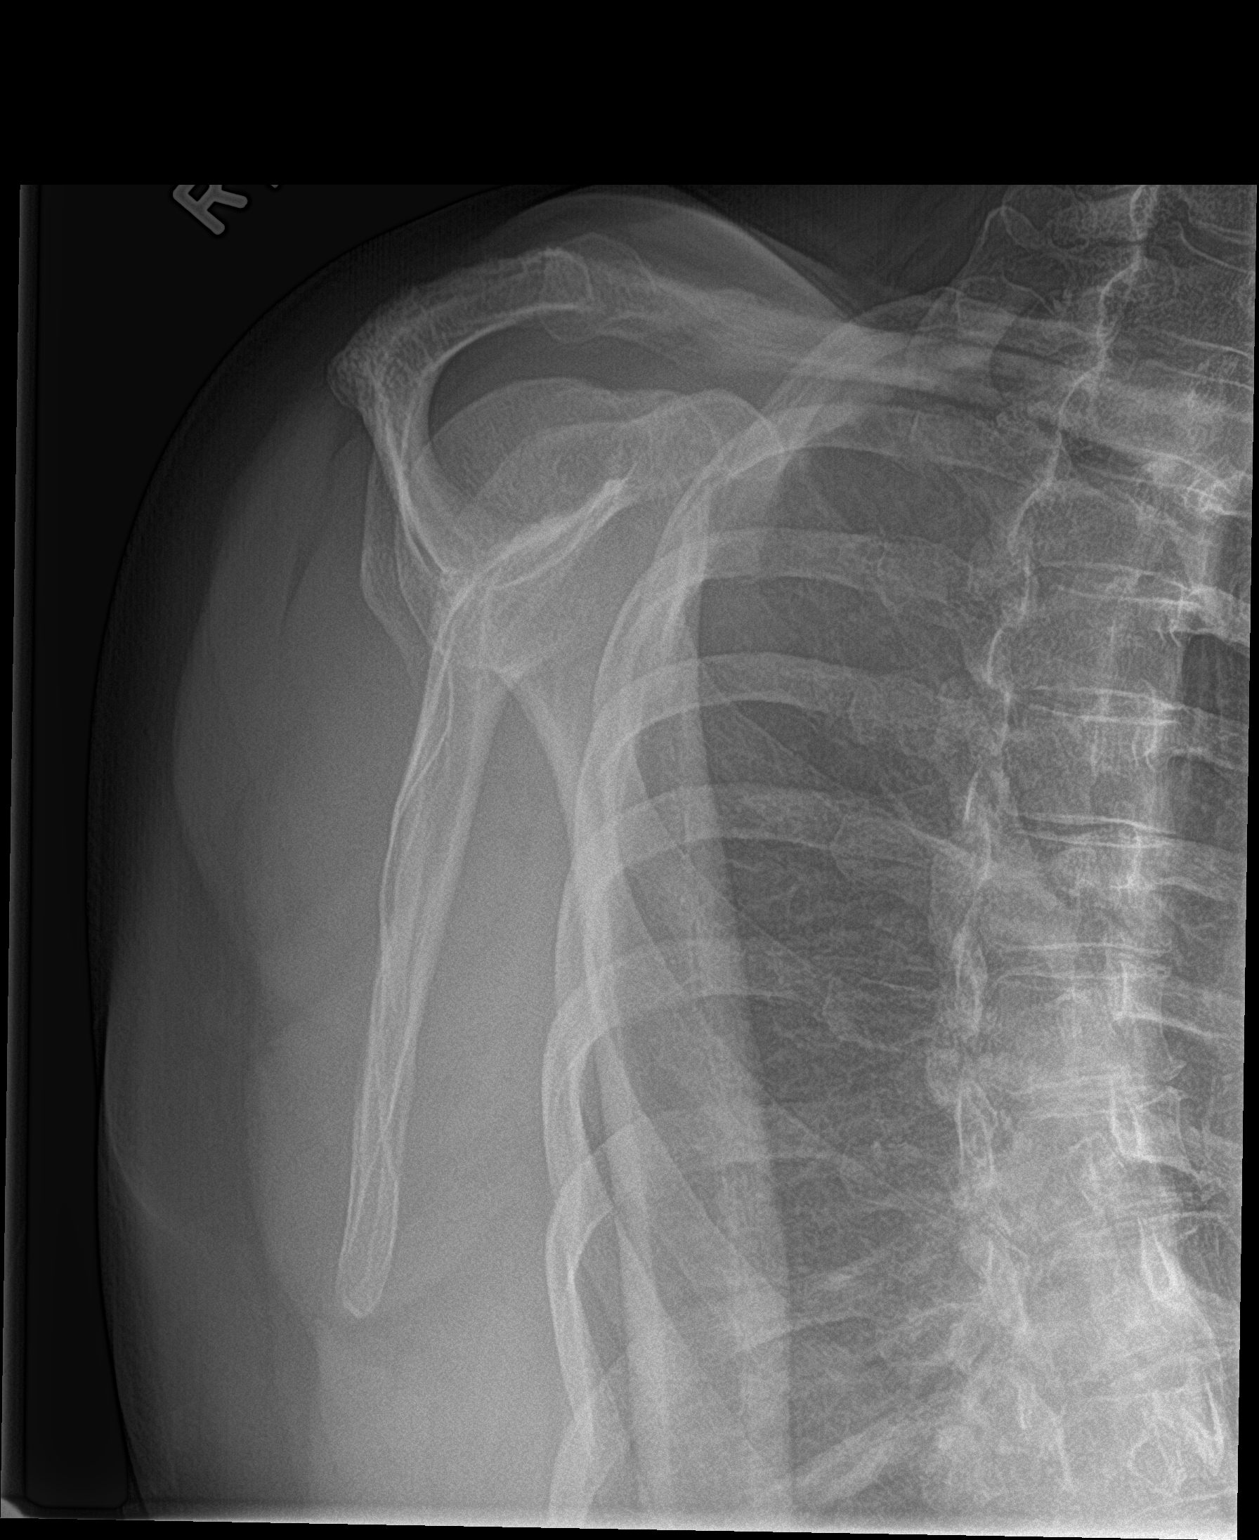

[shoulder axillary]
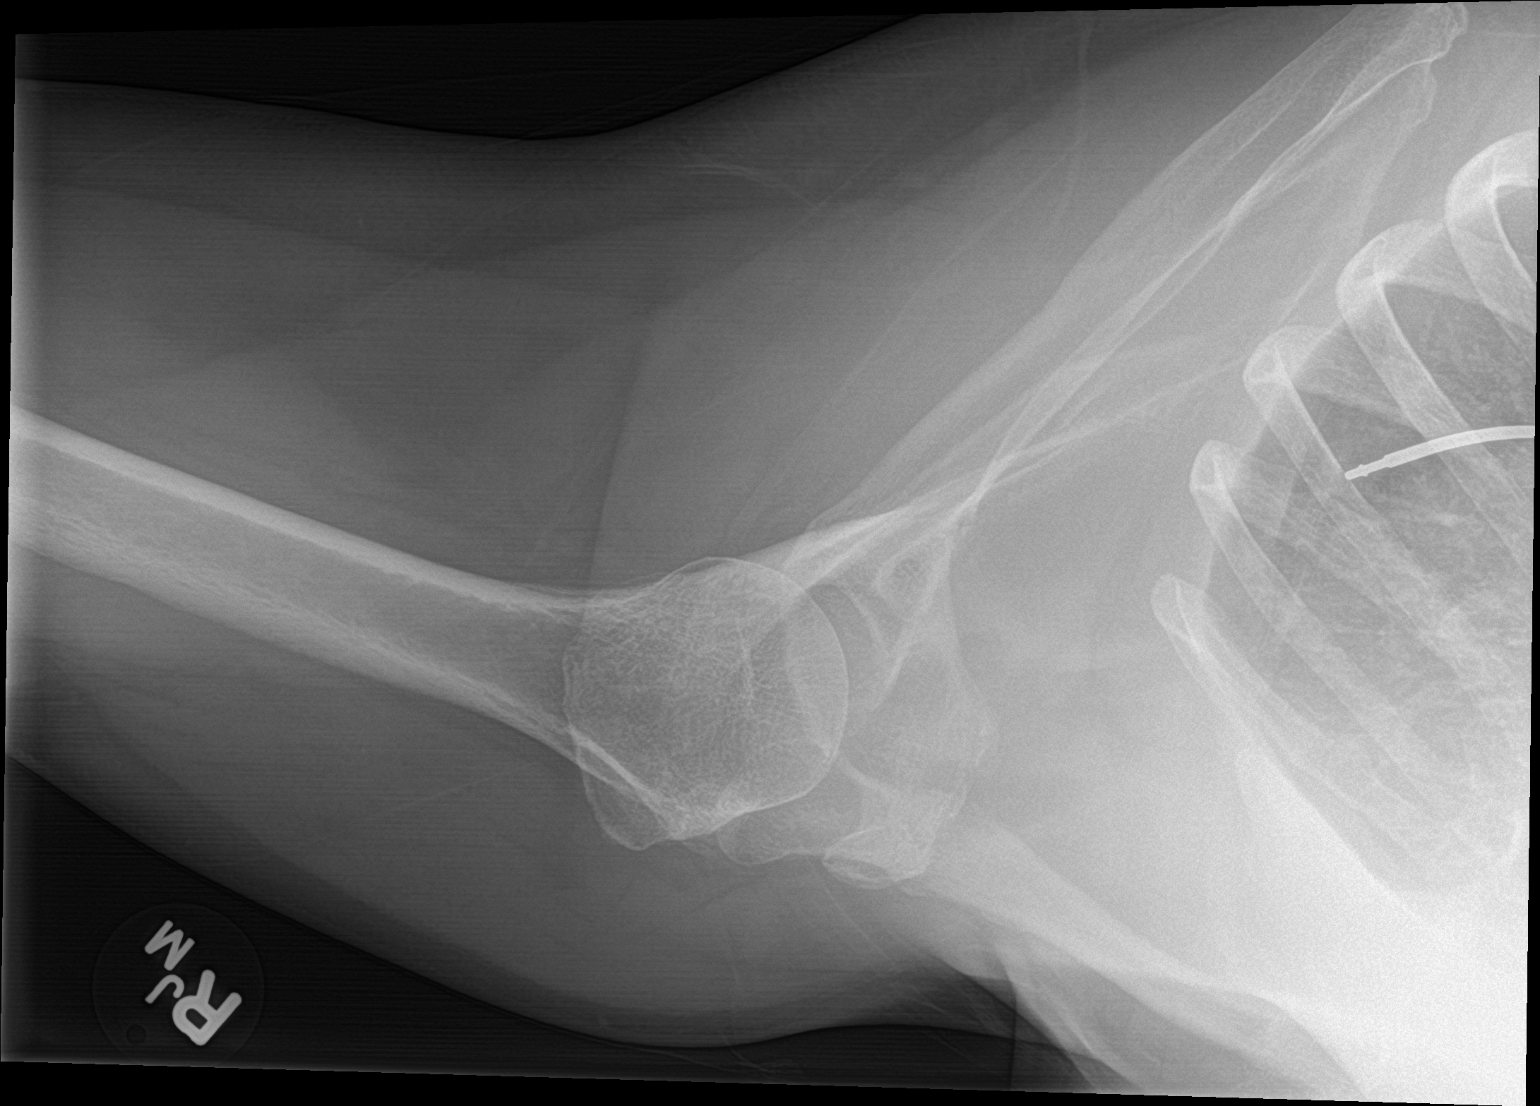

[3 of 3 positions shown; findings below may reference images not displayed]

FINDINGS: There is no evidence of fracture or dislocation. There is no
evidence of arthropathy or other focal bone abnormality. Soft
tissues are unremarkable.
IMPRESSION: Normal right shoulder.

## 2017-09-09 ENCOUNTER — Other Ambulatory Visit: Payer: Federal, State, Local not specified - PPO

## 2017-09-09 ENCOUNTER — Ambulatory Visit: Payer: Federal, State, Local not specified - PPO | Admitting: Family Medicine

## 2017-09-09 ENCOUNTER — Other Ambulatory Visit: Payer: Self-pay | Admitting: Family Medicine

## 2017-09-09 ENCOUNTER — Encounter: Payer: Self-pay | Admitting: Family Medicine

## 2017-09-09 VITALS — BP 138/80 | HR 69 | Temp 98.2°F | Resp 16 | Ht 65.0 in | Wt 203.6 lb

## 2017-09-09 DIAGNOSIS — I10 Essential (primary) hypertension: Secondary | ICD-10-CM

## 2017-09-09 DIAGNOSIS — E785 Hyperlipidemia, unspecified: Secondary | ICD-10-CM

## 2017-09-09 DIAGNOSIS — K080 Exfoliation of teeth due to systemic causes: Secondary | ICD-10-CM | POA: Diagnosis not present

## 2017-09-09 LAB — COMPREHENSIVE METABOLIC PANEL
ALK PHOS: 63 U/L (ref 39–117)
ALT: 20 U/L (ref 0–35)
AST: 17 U/L (ref 0–37)
Albumin: 4.4 g/dL (ref 3.5–5.2)
BILIRUBIN TOTAL: 0.6 mg/dL (ref 0.2–1.2)
BUN: 10 mg/dL (ref 6–23)
CO2: 27 mEq/L (ref 19–32)
Calcium: 9.7 mg/dL (ref 8.4–10.5)
Chloride: 108 mEq/L (ref 96–112)
Creatinine, Ser: 0.8 mg/dL (ref 0.40–1.20)
GFR: 80.73 mL/min (ref >60.00)
GLUCOSE: 98 mg/dL (ref 70–99)
POTASSIUM: 4.3 meq/L (ref 3.5–5.1)
Sodium: 139 mEq/L (ref 135–145)
TOTAL PROTEIN: 7.3 g/dL (ref 6.0–8.3)

## 2017-09-09 LAB — LIPID PANEL
Cholesterol: 143 mg/dL (ref 0–200)
HDL: 48.3 mg/dL (ref >39.00)
LDL Cholesterol: 82 mg/dL (ref 0–99)
NONHDL: 94.81
Total CHOL/HDL Ratio: 3
Triglycerides: 66 mg/dL (ref 0.0–149.0)
VLDL: 13.2 mg/dL (ref 0.0–40.0)

## 2017-09-09 MED ORDER — HYDROCHLOROTHIAZIDE 25 MG PO TABS: 25.0000 mg | ORAL_TABLET | Freq: Every day | ORAL | 3 refills | Status: DC

## 2017-09-09 NOTE — Progress Notes (Signed)
Patient ID: Falon Huesca, female   DOB: 1965-10-04, 52 y.o.   MRN: [id number]    Subjective:  I acted as a Neurosurgeon for Dr. Zola Button.  Apolonio Schneiders, CMA   Patient ID: Abriana Saltos, female    DOB: 03-20-1966, 52 y.o.   MRN: [id number]  Chief Complaint  Patient presents with  . Hypertension  . Hyperlipidemia     HPI  Patient is in today for follow up blood pressure and cholesterol.  She is wanting to get off of amlodipine. Her bp went up to 150/112 while on her cruise.    At home her bp are  running 140/90s.  No headache, or cp.  She did have some swelling   No cp or sob Patient Care Team: Zola Button, Grayling Congress, DO as PCP - General Kathleene Hazel, MD as Consulting Physician (Cardiology)   Past Medical History:  Diagnosis Date  . Alcohol abuse    recovering alcoholic  . Allergic urticaria   . Anxiety   . Cholecystitis, acute   . Congenital anomaly of neck   . Depression   . Dyslipidemia    Followed by PCP  . Gastroparesis   . GERD (gastroesophageal reflux disease)   . Hematuria   . Hiatal hernia    with reflux  . Hypertension    stress test 11/2010 normal  . Kidney stone   . Obesity   . PVC's (premature ventricular contractions)    Controlled on beta blocker therapy;  Echo 9/10: EF 55-60%, grade 1 diastolic dysfunction, mild MR, mild LAE  . Thyroid nodule    hx  . Tinea corporis   . Unspecified contraceptive management   . Urinary tract infection     Past Surgical History:  Procedure Laterality Date  . leep surgery     mid 20's  . TONSILLECTOMY      Family History  Problem Relation Age of Onset  . Diabetes Mother   . Liver disease Mother   . Irritable bowel syndrome Mother   . Colon polyps Mother   . Breast cancer Mother   . Thyroid disease Mother   . Hypertension Mother   . Hyperlipidemia Father   . Hypertension Father   . Lung cancer Maternal Uncle   . Lung cancer Maternal Grandmother   . Heart attack Maternal Grandmother 48  . Diabetes  Maternal Grandmother   . Lung cancer Paternal Grandmother   . Coronary artery disease Other        female 1st degree relative 50 yo  . Hypertension Other   . Colon cancer Neg Hx   . Stomach cancer Neg Hx     Social History   Socioeconomic History  . Marital status: Single    Spouse name: Not on file  . Number of children: Not on file  . Years of education: Not on file  . Highest education level: Not on file  Occupational History  . Occupation: post Public affairs consultant: [employer]  Social Needs  . Financial resource strain: Not on file  . Food insecurity:    Worry: Not on file    Inability: Not on file  . Transportation needs:    Medical: Not on file    Non-medical: Not on file  Tobacco Use  . Smoking status: Current Every Day Smoker    Packs/day: 1.00    Years: 24.00    Pack years: 24.00    Types: Cigarettes    Last attempt to quit: 07/02/2008  Years since quitting: 9.1  . Smokeless tobacco: Never Used  Substance and Sexual Activity  . Alcohol use: No    Alcohol/week: 0.0 oz  . Drug use: No  . Sexual activity: Not Currently    Partners: Male  Lifestyle  . Physical activity:    Days per week: Not on file    Minutes per session: Not on file  . Stress: Not on file  Relationships  . Social connections:    Talks on phone: Not on file    Gets together: Not on file    Attends religious service: Not on file    Active member of club or organization: Not on file    Attends meetings of clubs or organizations: Not on file    Relationship status: Not on file  . Intimate partner violence:    Fear of current or ex partner: Not on file    Emotionally abused: Not on file    Physically abused: Not on file    Forced sexual activity: Not on file  Other Topics Concern  . Not on file  Social History Narrative   Exercising---walking dog daily    Outpatient Medications Prior to Visit  Medication Sig Dispense Refill  . ALPRAZolam (XANAX) 0.25 MG tablet Take 1 tablet  (0.25 mg total) by mouth 3 (three) times daily as needed for anxiety. 30 tablet 2  . amLODipine (NORVASC) 5 MG tablet Take 1 tablet (5 mg total) by mouth daily. 90 tablet 1  . aspirin 81 MG chewable tablet Chew daily by mouth.    Marland Kitchen aspirin 81 MG tablet Take 81 mg by mouth daily.    Marland Kitchen atenolol (TENORMIN) 100 MG tablet Take 1 tablet (100 mg total) by mouth daily. 30 tablet 0  . atorvastatin (LIPITOR) 10 MG tablet Take 1 tablet (10 mg total) by mouth daily. 90 tablet 1  . clobetasol ointment (TEMOVATE) 0.05 % Apply to affected area every night for 4 weeks, then every other day for 4 weeks and then twice a week 60 g 5  . cyclobenzaprine (FLEXERIL) 10 MG tablet Take 1 tablet (10 mg total) by mouth 3 (three) times daily as needed for muscle spasms. 30 tablet 0  . medroxyPROGESTERone Acetate 150 MG/ML SUSY INJECT AS DIRECTED EVERY 3 MONTHS 1 Syringe 2  . omeprazole (PRILOSEC) 40 MG capsule Take 1 capsule (40 mg total) by mouth daily. 90 capsule 1  . cyanocobalamin (,VITAMIN B-12,) 1000 MCG/ML injection Inject 1 mL (1,000 mcg total) into the muscle once. 4 mL 0   No facility-administered medications prior to visit.     Allergies  Allergen Reactions  . Ace Inhibitors Other (See Comments)    Numbness, tingling of lips.  . Lisinopril     Other reaction(s): Other (See Comments) other  . Piroxicam Other (See Comments)    REACTION: hives REACTION: hives    Review of Systems  Constitutional: Negative for fever and malaise/fatigue.  HENT: Negative for congestion.   Eyes: Negative for blurred vision.  Respiratory: Negative for cough and shortness of breath.   Cardiovascular: Positive for leg swelling. Negative for chest pain and palpitations.  Gastrointestinal: Negative for vomiting.  Musculoskeletal: Negative for back pain.  Skin: Negative for rash.  Neurological: Negative for loss of consciousness and headaches.       Objective:    Physical Exam  Constitutional: She is oriented to person,  place, and time. She appears well-developed and well-nourished.  HENT:  Head: Normocephalic and atraumatic.  Eyes: Conjunctivae  and EOM are normal.  Neck: Normal range of motion. Neck supple. No JVD present. Carotid bruit is not present. No thyromegaly present.  Cardiovascular: Normal rate, regular rhythm and normal heart sounds.  No murmur heard. Pulmonary/Chest: Effort normal and breath sounds normal. No respiratory distress. She has no wheezes. She has no rales. She exhibits no tenderness.  Musculoskeletal: She exhibits edema.  Neurological: She is alert and oriented to person, place, and time.  Psychiatric: She has a normal mood and affect.  Nursing note and vitals reviewed.   BP 138/80 (BP Location: Left Arm, Cuff Size: Large)   Pulse 69   Temp 98.2 F (36.8 C) (Oral)   Resp 16   Ht 5\' 5"  (1.651 m)   Wt 203 lb 9.6 oz (92.4 kg)   SpO2 98%   BMI 33.88 kg/m  Wt Readings from Last 3 Encounters:  09/09/17 203 lb 9.6 oz (92.4 kg)  06/29/17 202 lb 6.4 oz (91.8 kg)  04/19/17 196 lb (88.9 kg)   BP Readings from Last 3 Encounters:  09/09/17 138/80  06/29/17 (!) 142/88  04/19/17 (!) 150/91     Immunization History  Administered Date(s) Administered  . Td 01/21/2007  . Tdap 02/04/2017    Health Maintenance  Topic Date Due  . HIV Screening  08/28/2035 (Originally 10/16/1982)  . INFLUENZA VACCINE  12/30/2017  . PAP SMEAR  02/05/2020  . TETANUS/TDAP  02/05/2027    Lab Results  Component Value Date   WBC 7.1 02/04/2017   HGB 14.3 02/04/2017   HCT 43.3 02/04/2017   PLT 232.0 02/04/2017   GLUCOSE 98 09/09/2017   CHOL 143 09/09/2017   TRIG 66.0 09/09/2017   HDL 48.30 09/09/2017   LDLDIRECT 148.1 01/20/2008   LDLCALC 82 09/09/2017   ALT 20 09/09/2017   AST 17 09/09/2017   NA 139 09/09/2017   K 4.3 09/09/2017   CL 108 09/09/2017   CREATININE 0.80 09/09/2017   BUN 10 09/09/2017   CO2 27 09/09/2017   TSH 0.90 02/04/2017    Lab Results  Component Value Date   TSH  0.90 02/04/2017   Lab Results  Component Value Date   WBC 7.1 02/04/2017   HGB 14.3 02/04/2017   HCT 43.3 02/04/2017   MCV 95.4 02/04/2017   PLT 232.0 02/04/2017   Lab Results  Component Value Date   NA 139 09/09/2017   K 4.3 09/09/2017   CO2 27 09/09/2017   GLUCOSE 98 09/09/2017   BUN 10 09/09/2017   CREATININE 0.80 09/09/2017   BILITOT 0.6 09/09/2017   ALKPHOS 63 09/09/2017   AST 17 09/09/2017   ALT 20 09/09/2017   PROT 7.3 09/09/2017   ALBUMIN 4.4 09/09/2017   CALCIUM 9.7 09/09/2017   ANIONGAP 8 08/30/2015   GFR 80.73 09/09/2017   Lab Results  Component Value Date   CHOL 143 09/09/2017   Lab Results  Component Value Date   HDL 48.30 09/09/2017   Lab Results  Component Value Date   LDLCALC 82 09/09/2017   Lab Results  Component Value Date   TRIG 66.0 09/09/2017   Lab Results  Component Value Date   CHOLHDL 3 09/09/2017   No results found for: HGBA1C       Assessment & Plan:   Problem List Items Addressed This Visit      Unprioritized   Essential hypertension - Primary    Poorly controlled will alter medications, encouraged DASH diet, minimize caffeine and obtain adequate sleep. Report concerning symptoms and  follow up as directed and as needed      Relevant Medications   hydrochlorothiazide (HYDRODIURIL) 25 MG tablet   Other Relevant Orders   Comprehensive metabolic panel (Completed)   Hyperlipidemia LDL goal <100    Tolerating statin, encouraged heart healthy diet, avoid trans fats, minimize simple carbs and saturated fats. Increase exercise as tolerated      Relevant Medications   hydrochlorothiazide (HYDRODIURIL) 25 MG tablet   Other Relevant Orders   Lipid panel (Completed)      I have discontinued Cerissa Requena's cyanocobalamin. I am also having her start on hydrochlorothiazide. Additionally, I am having her maintain her aspirin, clobetasol ointment, cyclobenzaprine, ALPRAZolam, aspirin, atorvastatin, medroxyPROGESTERone Acetate,  amLODipine, atenolol, and omeprazole.  Meds ordered this encounter  Medications  . hydrochlorothiazide (HYDRODIURIL) 25 MG tablet    Sig: Take 1 tablet (25 mg total) by mouth daily.    Dispense:  90 tablet    Refill:  3    CMA served as scribe during this visit. History, Physical and Plan performed by medical provider. Documentation and orders reviewed and attested to.  Donato SchultzYvonne R Lowne Chase, DO

## 2017-09-09 NOTE — Patient Instructions (Signed)
DASH Eating Plan DASH stands for "Dietary Approaches to Stop Hypertension." The DASH eating plan is a healthy eating plan that has been shown to reduce high blood pressure (hypertension). It may also reduce your risk for type 2 diabetes, heart disease, and stroke. The DASH eating plan may also help with weight loss. What are tips for following this plan? General guidelines  Avoid eating more than 2,300 mg (milligrams) of salt (sodium) a day. If you have hypertension, you may need to reduce your sodium intake to 1,500 mg a day.  Limit alcohol intake to no more than 1 drink a day for nonpregnant women and 2 drinks a day for men. One drink equals 12 oz of beer, 5 oz of wine, or 1 oz of hard liquor.  Work with your health care provider to maintain a healthy body weight or to lose weight. Ask what an ideal weight is for you.  Get at least 30 minutes of exercise that causes your heart to beat faster (aerobic exercise) most days of the week. Activities may include walking, swimming, or biking.  Work with your health care provider or diet and nutrition specialist (dietitian) to adjust your eating plan to your individual calorie needs. Reading food labels  Check food labels for the amount of sodium per serving. Choose foods with less than 5 percent of the Daily Value of sodium. Generally, foods with less than 300 mg of sodium per serving fit into this eating plan.  To find whole grains, look for the word "whole" as the first word in the ingredient list. Shopping  Buy products labeled as "low-sodium" or "no salt added."  Buy fresh foods. Avoid canned foods and premade or frozen meals. Cooking  Avoid adding salt when cooking. Use salt-free seasonings or herbs instead of table salt or sea salt. Check with your health care provider or pharmacist before using salt substitutes.  Do not fry foods. Cook foods using healthy methods such as baking, boiling, grilling, and broiling instead.  Cook with  heart-healthy oils, such as olive, canola, soybean, or sunflower oil. Meal planning   Eat a balanced diet that includes: ? 5 or more servings of fruits and vegetables each day. At each meal, try to fill half of your plate with fruits and vegetables. ? Up to 6-8 servings of whole grains each day. ? Less than 6 oz of lean meat, poultry, or fish each day. A 3-oz serving of meat is about the same size as a deck of cards. One egg equals 1 oz. ? 2 servings of low-fat dairy each day. ? A serving of nuts, seeds, or beans 5 times each week. ? Heart-healthy fats. Healthy fats called Omega-3 fatty acids are found in foods such as flaxseeds and coldwater fish, like sardines, salmon, and mackerel.  Limit how much you eat of the following: ? Canned or prepackaged foods. ? Food that is high in trans fat, such as fried foods. ? Food that is high in saturated fat, such as fatty meat. ? Sweets, desserts, sugary drinks, and other foods with added sugar. ? Full-fat dairy products.  Do not salt foods before eating.  Try to eat at least 2 vegetarian meals each week.  Eat more home-cooked food and less restaurant, buffet, and fast food.  When eating at a restaurant, ask that your food be prepared with less salt or no salt, if possible. What foods are recommended? The items listed may not be a complete list. Talk with your dietitian about what   dietary choices are best for you. Grains Whole-grain or whole-wheat bread. Whole-grain or whole-wheat pasta. Brown rice. Oatmeal. Quinoa. Bulgur. Whole-grain and low-sodium cereals. Pita bread. Low-fat, low-sodium crackers. Whole-wheat flour tortillas. Vegetables Fresh or frozen vegetables (raw, steamed, roasted, or grilled). Low-sodium or reduced-sodium tomato and vegetable juice. Low-sodium or reduced-sodium tomato sauce and tomato paste. Low-sodium or reduced-sodium canned vegetables. Fruits All fresh, dried, or frozen fruit. Canned fruit in natural juice (without  added sugar). Meat and other protein foods Skinless chicken or turkey. Ground chicken or turkey. Pork with fat trimmed off. Fish and seafood. Egg whites. Dried beans, peas, or lentils. Unsalted nuts, nut butters, and seeds. Unsalted canned beans. Lean cuts of beef with fat trimmed off. Low-sodium, lean deli meat. Dairy Low-fat (1%) or fat-free (skim) milk. Fat-free, low-fat, or reduced-fat cheeses. Nonfat, low-sodium ricotta or cottage cheese. Low-fat or nonfat yogurt. Low-fat, low-sodium cheese. Fats and oils Soft margarine without trans fats. Vegetable oil. Low-fat, reduced-fat, or light mayonnaise and salad dressings (reduced-sodium). Canola, safflower, olive, soybean, and sunflower oils. Avocado. Seasoning and other foods Herbs. Spices. Seasoning mixes without salt. Unsalted popcorn and pretzels. Fat-free sweets. What foods are not recommended? The items listed may not be a complete list. Talk with your dietitian about what dietary choices are best for you. Grains Baked goods made with fat, such as croissants, muffins, or some breads. Dry pasta or rice meal packs. Vegetables Creamed or fried vegetables. Vegetables in a cheese sauce. Regular canned vegetables (not low-sodium or reduced-sodium). Regular canned tomato sauce and paste (not low-sodium or reduced-sodium). Regular tomato and vegetable juice (not low-sodium or reduced-sodium). Pickles. Olives. Fruits Canned fruit in a light or heavy syrup. Fried fruit. Fruit in cream or butter sauce. Meat and other protein foods Fatty cuts of meat. Ribs. Fried meat. Bacon. Sausage. Bologna and other processed lunch meats. Salami. Fatback. Hotdogs. Bratwurst. Salted nuts and seeds. Canned beans with added salt. Canned or smoked fish. Whole eggs or egg yolks. Chicken or turkey with skin. Dairy Whole or 2% milk, cream, and half-and-half. Whole or full-fat cream cheese. Whole-fat or sweetened yogurt. Full-fat cheese. Nondairy creamers. Whipped toppings.  Processed cheese and cheese spreads. Fats and oils Butter. Stick margarine. Lard. Shortening. Ghee. Bacon fat. Tropical oils, such as coconut, palm kernel, or palm oil. Seasoning and other foods Salted popcorn and pretzels. Onion salt, garlic salt, seasoned salt, table salt, and sea salt. Worcestershire sauce. Tartar sauce. Barbecue sauce. Teriyaki sauce. Soy sauce, including reduced-sodium. Steak sauce. Canned and packaged gravies. Fish sauce. Oyster sauce. Cocktail sauce. Horseradish that you find on the shelf. Ketchup. Mustard. Meat flavorings and tenderizers. Bouillon cubes. Hot sauce and Tabasco sauce. Premade or packaged marinades. Premade or packaged taco seasonings. Relishes. Regular salad dressings. Where to find more information:  National Heart, Lung, and Blood Institute: www.nhlbi.nih.gov  American Heart Association: www.heart.org Summary  The DASH eating plan is a healthy eating plan that has been shown to reduce high blood pressure (hypertension). It may also reduce your risk for type 2 diabetes, heart disease, and stroke.  With the DASH eating plan, you should limit salt (sodium) intake to 2,300 mg a day. If you have hypertension, you may need to reduce your sodium intake to 1,500 mg a day.  When on the DASH eating plan, aim to eat more fresh fruits and vegetables, whole grains, lean proteins, low-fat dairy, and heart-healthy fats.  Work with your health care provider or diet and nutrition specialist (dietitian) to adjust your eating plan to your individual   calorie needs. This information is not intended to replace advice given to you by your health care provider. Make sure you discuss any questions you have with your health care provider. Document Released: 05/07/2011 Document Revised: 05/11/2016 Document Reviewed: 05/11/2016 Elsevier Interactive Patient Education  2018 [employer].  

## 2017-09-09 NOTE — Assessment & Plan Note (Signed)
Poorly controlled will alter medications, encouraged DASH diet, minimize caffeine and obtain adequate sleep. Report concerning symptoms and follow up as directed and as needed 

## 2017-09-09 NOTE — Assessment & Plan Note (Addendum)
Tolerating statin, encouraged heart healthy diet, avoid trans fats, minimize simple carbs and saturated fats. Increase exercise as tolerated 

## 2017-12-09 DIAGNOSIS — T63441A Toxic effect of venom of bees, accidental (unintentional), initial encounter: Secondary | ICD-10-CM | POA: Diagnosis not present

## 2017-12-09 DIAGNOSIS — L258 Unspecified contact dermatitis due to other agents: Secondary | ICD-10-CM | POA: Diagnosis not present

## 2017-12-09 DIAGNOSIS — R21 Rash and other nonspecific skin eruption: Secondary | ICD-10-CM | POA: Diagnosis not present

## 2017-12-10 ENCOUNTER — Other Ambulatory Visit: Payer: Self-pay | Admitting: Family Medicine

## 2017-12-10 DIAGNOSIS — Z1239 Encounter for other screening for malignant neoplasm of breast: Secondary | ICD-10-CM

## 2017-12-16 ENCOUNTER — Other Ambulatory Visit: Payer: Self-pay | Admitting: Family Medicine

## 2017-12-16 DIAGNOSIS — E785 Hyperlipidemia, unspecified: Secondary | ICD-10-CM

## 2017-12-17 ENCOUNTER — Ambulatory Visit: Payer: Federal, State, Local not specified - PPO | Admitting: Family Medicine

## 2017-12-17 ENCOUNTER — Encounter: Payer: Self-pay | Admitting: Family Medicine

## 2017-12-17 ENCOUNTER — Ambulatory Visit (INDEPENDENT_AMBULATORY_CARE_PROVIDER_SITE_OTHER): Payer: Federal, State, Local not specified - PPO

## 2017-12-17 VITALS — BP 124/82 | HR 58 | Temp 98.3°F | Ht 65.0 in | Wt 201.4 lb

## 2017-12-17 DIAGNOSIS — Z91030 Bee allergy status: Secondary | ICD-10-CM

## 2017-12-17 DIAGNOSIS — E785 Hyperlipidemia, unspecified: Secondary | ICD-10-CM | POA: Diagnosis not present

## 2017-12-17 DIAGNOSIS — I10 Essential (primary) hypertension: Secondary | ICD-10-CM | POA: Diagnosis not present

## 2017-12-17 DIAGNOSIS — Z1231 Encounter for screening mammogram for malignant neoplasm of breast: Secondary | ICD-10-CM | POA: Diagnosis not present

## 2017-12-17 DIAGNOSIS — R928 Other abnormal and inconclusive findings on diagnostic imaging of breast: Secondary | ICD-10-CM

## 2017-12-17 DIAGNOSIS — Z1239 Encounter for other screening for malignant neoplasm of breast: Secondary | ICD-10-CM

## 2017-12-17 DIAGNOSIS — Z0000 Encounter for general adult medical examination without abnormal findings: Secondary | ICD-10-CM

## 2017-12-17 LAB — LIPID PANEL
Cholesterol: 159 mg/dL (ref 0–200)
HDL: 46.7 mg/dL
LDL Cholesterol: 95 mg/dL (ref 0–99)
NonHDL: 112.59
Total CHOL/HDL Ratio: 3
Triglycerides: 90 mg/dL (ref 0.0–149.0)
VLDL: 18 mg/dL (ref 0.0–40.0)

## 2017-12-17 LAB — COMPREHENSIVE METABOLIC PANEL
ALT: 23 U/L (ref 0–35)
AST: 15 U/L (ref 0–37)
Albumin: 4.2 g/dL (ref 3.5–5.2)
Alkaline Phosphatase: 64 U/L (ref 39–117)
BUN: 10 mg/dL (ref 6–23)
CO2: 28 meq/L (ref 19–32)
Calcium: 9.5 mg/dL (ref 8.4–10.5)
Chloride: 103 mEq/L (ref 96–112)
Creatinine, Ser: 0.87 mg/dL (ref 0.40–1.20)
GFR: 73.2 mL/min (ref >60.00)
GLUCOSE: 102 mg/dL — AB (ref 70–99)
POTASSIUM: 3.7 meq/L (ref 3.5–5.1)
Sodium: 137 mEq/L (ref 135–145)
Total Bilirubin: 0.6 mg/dL (ref 0.2–1.2)
Total Protein: 7.1 g/dL (ref 6.0–8.3)

## 2017-12-17 MED ORDER — EPINEPHRINE 0.3 MG/0.3ML IJ SOAJ: 0.3000 mg | Freq: Once | INTRAMUSCULAR | Status: AC

## 2017-12-17 NOTE — Assessment & Plan Note (Signed)
Encouraged heart healthy diet, increase exercise, avoid trans fats, consider a krill oil cap daily 

## 2017-12-17 NOTE — Progress Notes (Signed)
Patient ID: [name] Ramos, female    DOB: 07-21-1965  Age: 52 y.o. MRN: [id number]    Subjective:  Subjective  HPI [name] Ramos presents for bp and cholesterol f/u.  No complaints.    Review of Systems  Constitutional: Negative for appetite change, diaphoresis, fatigue and unexpected weight change.  Eyes: Negative for pain, redness and visual disturbance.  Respiratory: Negative for cough, chest tightness, shortness of breath and wheezing.   Cardiovascular: Negative for chest pain, palpitations and leg swelling.  Endocrine: Negative for cold intolerance, heat intolerance, polydipsia, polyphagia and polyuria.  Genitourinary: Negative for difficulty urinating, dysuria and frequency.  Neurological: Negative for dizziness, light-headedness, numbness and headaches.    History Past Medical History:  Diagnosis Date  . Alcohol abuse    recovering alcoholic  . Allergic urticaria   . Anxiety   . Cholecystitis, acute   . Congenital anomaly of neck   . Depression   . Dyslipidemia    Followed by PCP  . Gastroparesis   . GERD (gastroesophageal reflux disease)   . Hematuria   . Hiatal hernia    with reflux  . Hypertension    stress test 11/2010 normal  . Kidney stone   . Obesity   . PVC's (premature ventricular contractions)    Controlled on beta blocker therapy;  Echo 9/10: EF 55-60%, grade 1 diastolic dysfunction, mild MR, mild LAE  . Thyroid nodule    hx  . Tinea corporis   . Unspecified contraceptive management   . Urinary tract infection     She has a past surgical history that includes Tonsillectomy and leep surgery.   Her family history includes Breast cancer in her mother; Colon polyps in her mother; Coronary artery disease in her other; Diabetes in her maternal grandmother and mother; Heart attack (age of onset: 6248) in her maternal grandmother; Hyperlipidemia in her father; Hypertension in her father, mother, and other; Irritable bowel syndrome in her mother; Liver  disease in her mother; Lung cancer in her maternal grandmother, maternal uncle, and paternal grandmother; Thyroid disease in her mother.She reports that she has been smoking cigarettes.  She has a 24.00 pack-year smoking history. She has never used smokeless tobacco. She reports that she does not drink alcohol or use drugs.  Current Outpatient Medications on File Prior to Visit  Medication Sig Dispense Refill  . ALPRAZolam (XANAX) 0.25 MG tablet Take 1 tablet (0.25 mg total) by mouth 3 (three) times daily as needed for anxiety. 30 tablet 2  . amLODipine (NORVASC) 5 MG tablet Take 1 tablet (5 mg total) by mouth daily. 90 tablet 1  . aspirin 81 MG tablet Take 81 mg by mouth daily.    Marland Kitchen. atenolol (TENORMIN) 100 MG tablet Take 1 tablet (100 mg total) by mouth daily. 30 tablet 0  . atorvastatin (LIPITOR) 10 MG tablet TAKE 1 TABLET BY MOUTH EVERY DAY 90 tablet 1  . clobetasol ointment (TEMOVATE) 0.05 % Apply to affected area every night for 4 weeks, then every other day for 4 weeks and then twice a week 60 g 5  . cyclobenzaprine (FLEXERIL) 10 MG tablet Take 1 tablet (10 mg total) by mouth 3 (three) times daily as needed for muscle spasms. 30 tablet 0  . hydrochlorothiazide (HYDRODIURIL) 25 MG tablet Take 1 tablet (25 mg total) by mouth daily. 90 tablet 3  . medroxyPROGESTERone Acetate 150 MG/ML SUSY INJECT AS DIRECTED EVERY 3 MONTHS 1 Syringe 2  . omeprazole (PRILOSEC) 40 MG capsule Take 1 capsule (  40 mg total) by mouth daily. 90 capsule 1  . [DISCONTINUED] oxybutynin (DITROPAN-XL) 10 MG 24 hr tablet Take 10 mg by mouth daily.     No current facility-administered medications on file prior to visit.      Objective:  Objective  Physical Exam  Constitutional: She is oriented to person, place, and time. She appears well-developed and well-nourished.  HENT:  Head: Normocephalic and atraumatic.  Eyes: Conjunctivae and EOM are normal.  Neck: Normal range of motion. Neck supple. No JVD present. Carotid  bruit is not present. No thyromegaly present.  Cardiovascular: Normal rate, regular rhythm and normal heart sounds.  No murmur heard. Pulmonary/Chest: Effort normal and breath sounds normal. No respiratory distress. She has no wheezes. She has no rales. She exhibits no tenderness.  Musculoskeletal: She exhibits no edema.  Neurological: She is alert and oriented to person, place, and time.  Psychiatric: She has a normal mood and affect.  Nursing note and vitals reviewed.  BP 124/82 (BP Location: Left Arm, Patient Position: Sitting, Cuff Size: Large)   Pulse (!) 58   Temp 98.3 F (36.8 C) (Oral)   Ht 5\' 5"  (1.651 m)   Wt 201 lb 6.4 oz (91.4 kg)   SpO2 97%   BMI 33.51 kg/m  Wt Readings from Last 3 Encounters:  12/17/17 201 lb 6.4 oz (91.4 kg)  09/09/17 203 lb 9.6 oz (92.4 kg)  06/29/17 202 lb 6.4 oz (91.8 kg)     Lab Results  Component Value Date   WBC 7.1 02/04/2017   HGB 14.3 02/04/2017   HCT 43.3 02/04/2017   PLT 232.0 02/04/2017   GLUCOSE 98 09/09/2017   CHOL 143 09/09/2017   TRIG 66.0 09/09/2017   HDL 48.30 09/09/2017   LDLDIRECT 148.1 01/20/2008   LDLCALC 82 09/09/2017   ALT 20 09/09/2017   AST 17 09/09/2017   NA 139 09/09/2017   K 4.3 09/09/2017   CL 108 09/09/2017   CREATININE 0.80 09/09/2017   BUN 10 09/09/2017   CO2 27 09/09/2017   TSH 0.90 02/04/2017    Dg Lumbar Spine Complete  Result Date: 04/19/2017 CLINICAL DATA:  Lumbago with right-sided radicular symptoms EXAM: LUMBAR SPINE - COMPLETE 4+ VIEW COMPARISON:  None. FINDINGS: Frontal, lateral, spot lumbosacral lateral, and bilateral oblique views were obtained. There are 5 non-rib-bearing lumbar type vertebral bodies. There is no fracture or spondylolisthesis. There is mild disc space narrowing at L2-3 and L3-4. There is facet osteoarthritic change at L5-S1 bilaterally. There is aortic atherosclerosis. IMPRESSION: Areas of relatively mild osteoarthritic change. No fracture or spondylolisthesis. There is  aortic atherosclerosis. Electronically Signed   By: Bretta Bang III M.D.   On: 04/19/2017 10:00     Assessment & Plan:  Plan  I am having [name] Ramos start on EPINEPHrine. I am also having her maintain her aspirin, clobetasol ointment, cyclobenzaprine, ALPRAZolam, medroxyPROGESTERone Acetate, amLODipine, atenolol, omeprazole, hydrochlorothiazide, and atorvastatin.  Meds ordered this encounter  Medications  . EPINEPHrine (EPIPEN 2-PAK) 0.3 mg/0.3 mL IJ SOAJ injection    Sig: Inject 0.3 mLs (0.3 mg total) into the muscle once for 1 dose.    Dispense:  1 Device    Problem List Items Addressed This Visit      Unprioritized   Essential hypertension - Primary    Well controlled, no changes to meds. Encouraged heart healthy diet such as the DASH diet and exercise as tolerated.       Relevant Medications   EPINEPHrine (EPIPEN 2-PAK) 0.3 mg/0.3  mL IJ SOAJ injection   Other Relevant Orders   Lipid panel   Comprehensive metabolic panel   Hyperlipidemia LDL goal <100    .Encouraged heart healthy diet, increase exercise, avoid trans fats, consider a krill oil cap daily      Relevant Medications   EPINEPHrine (EPIPEN 2-PAK) 0.3 mg/0.3 mL IJ SOAJ injection   Other Relevant Orders   Lipid panel   Comprehensive metabolic panel    Other Visit Diagnoses    Bee sting allergy       Relevant Medications   EPINEPHrine (EPIPEN 2-PAK) 0.3 mg/0.3 mL IJ SOAJ injection   Preventative health care       Relevant Orders   Ambulatory referral to Gastroenterology      Follow-up: Return in about 6 months (around 06/19/2018) for annual exam, fasting.  Donato Schultz, DO

## 2017-12-17 NOTE — Assessment & Plan Note (Signed)
Well controlled, no changes to meds. Encouraged heart healthy diet such as the DASH diet and exercise as tolerated.  °

## 2017-12-17 NOTE — Patient Instructions (Signed)

## 2017-12-20 ENCOUNTER — Telehealth: Payer: Self-pay | Admitting: *Deleted

## 2017-12-20 ENCOUNTER — Other Ambulatory Visit: Payer: Self-pay | Admitting: Family Medicine

## 2017-12-20 DIAGNOSIS — R928 Other abnormal and inconclusive findings on diagnostic imaging of breast: Secondary | ICD-10-CM

## 2017-12-20 NOTE — Telephone Encounter (Signed)
Received Physician Orders from [facility]; forwarded to provider/SLS 07/22

## 2017-12-21 NOTE — Addendum Note (Signed)
Addended byConrad Honeoye: Arnita Koons D on: 12/21/2017 08:24 AM   Modules accepted: Orders

## 2017-12-24 ENCOUNTER — Ambulatory Visit
Admission: RE | Admit: 2017-12-24 | Discharge: 2017-12-24 | Disposition: A | Discharge status: Home / Self Care | Payer: Federal, State, Local not specified - PPO | Source: Ambulatory Visit | Attending: Family Medicine | Admitting: Family Medicine

## 2017-12-24 ENCOUNTER — Ambulatory Visit: Admission: RE | Admit: 2017-12-24 | Payer: Federal, State, Local not specified - PPO | Source: Ambulatory Visit

## 2017-12-24 DIAGNOSIS — R928 Other abnormal and inconclusive findings on diagnostic imaging of breast: Secondary | ICD-10-CM

## 2017-12-24 DIAGNOSIS — R922 Inconclusive mammogram: Secondary | ICD-10-CM | POA: Diagnosis not present

## 2017-12-26 ENCOUNTER — Other Ambulatory Visit: Payer: Self-pay | Admitting: Family Medicine

## 2017-12-26 DIAGNOSIS — I10 Essential (primary) hypertension: Secondary | ICD-10-CM

## 2018-01-06 ENCOUNTER — Ambulatory Visit: Payer: Self-pay | Admitting: *Deleted

## 2018-01-06 DIAGNOSIS — T63461A Toxic effect of venom of wasps, accidental (unintentional), initial encounter: Secondary | ICD-10-CM | POA: Diagnosis not present

## 2018-01-06 NOTE — Telephone Encounter (Signed)
Pt reports stung by wasp yesterday on left cheek.  States she was told months ago she "had a bee sting allergy"  after large amount of swelling from sting on leg; she was given a Epi-Pen. She did not use Epi-pen yesterday [facility]. At that time, upper lip, left eye and cheek were swollen, tongue was "Burning."  VSS per pt's report.  States they recommended she go to ED which she declined. They then recommended she see PCP today for possible steroid injection. States she took benadryl after event and again at midnight.  States this am tongue, lips improved but left side of face "More swollen."  Denies any SOB, dysphagia. No availability at office. Spoke with Windell MouldingRuth, unable to secure appt.. Pt declined appt at another practice; directed to [facility]. Pt states she will follow disposition.  Care advise given per protocol. Reason for Disposition . Swelling is huge (e.g., > 4 inches or 10 cm, spreads beyond wrist or ankle)  Answer Assessment - Initial Assessment Questions 1. TYPE: "What type of sting was it?" (bee, yellow jacket, etc.)      bee 2. ONSET: "When did it occur?"      Yesterday 3. LOCATION: "Where is the sting located?"  "How many stings?"     Left cheek 4. SWELLING SIZE: "How big is the swelling?" (inches or centimeters)     Left side of face, unable to give accurate answer, "large" 5. REDNESS: "Is the area red or pink?" If so, ask "What size is area of redness?" (inches or cm). "When did the redness start?"     no 6. PAIN: "Is there any pain?" If so, ask: "How bad is it?"  (Scale 1-10; or mild, moderate, severe)     no 7. ITCHING: "Is there any itching?" If so, ask: "How bad is it?"      Yesterday, not presently 8. RESPIRATORY DISTRESS: "Describe your breathing."     no 9. PRIOR REACTIONS: "Have you had any severe allergic reactions to stings in the past?" if yes, ask: "What happened?"    Yes, swelling. HAs epi-pen, did not use. 10. OTHER SYMPTOMS: "Do you have  any other symptoms?" (e.g., face or tongue swelling, new rash elsewhere, abdominal pain, vomiting)      Mild "Burning" of tongue  Protocols used: BEE OR YELLOW JACKET STING-A-AH

## 2018-02-02 ENCOUNTER — Other Ambulatory Visit: Payer: Self-pay | Admitting: Family Medicine

## 2018-02-02 DIAGNOSIS — I10 Essential (primary) hypertension: Secondary | ICD-10-CM

## 2018-03-04 ENCOUNTER — Encounter: Payer: Federal, State, Local not specified - PPO | Admitting: Family Medicine

## 2018-03-24 ENCOUNTER — Ambulatory Visit: Payer: Federal, State, Local not specified - PPO | Admitting: Interventional Cardiology

## 2018-03-26 ENCOUNTER — Other Ambulatory Visit: Payer: Self-pay | Admitting: Family Medicine

## 2018-03-26 DIAGNOSIS — I10 Essential (primary) hypertension: Secondary | ICD-10-CM

## 2018-03-31 DIAGNOSIS — J029 Acute pharyngitis, unspecified: Secondary | ICD-10-CM | POA: Diagnosis not present

## 2018-04-01 IMAGING — DX DG LUMBAR SPINE COMPLETE 4+V
5 series · 5 of 5 positions shown · non-contrast
Comparison: None.

CLINICAL DATA: Lumbago with right-sided radicular symptoms

EXAM:
LUMBAR SPINE - COMPLETE 4+ VIEW

[l-spine ap]
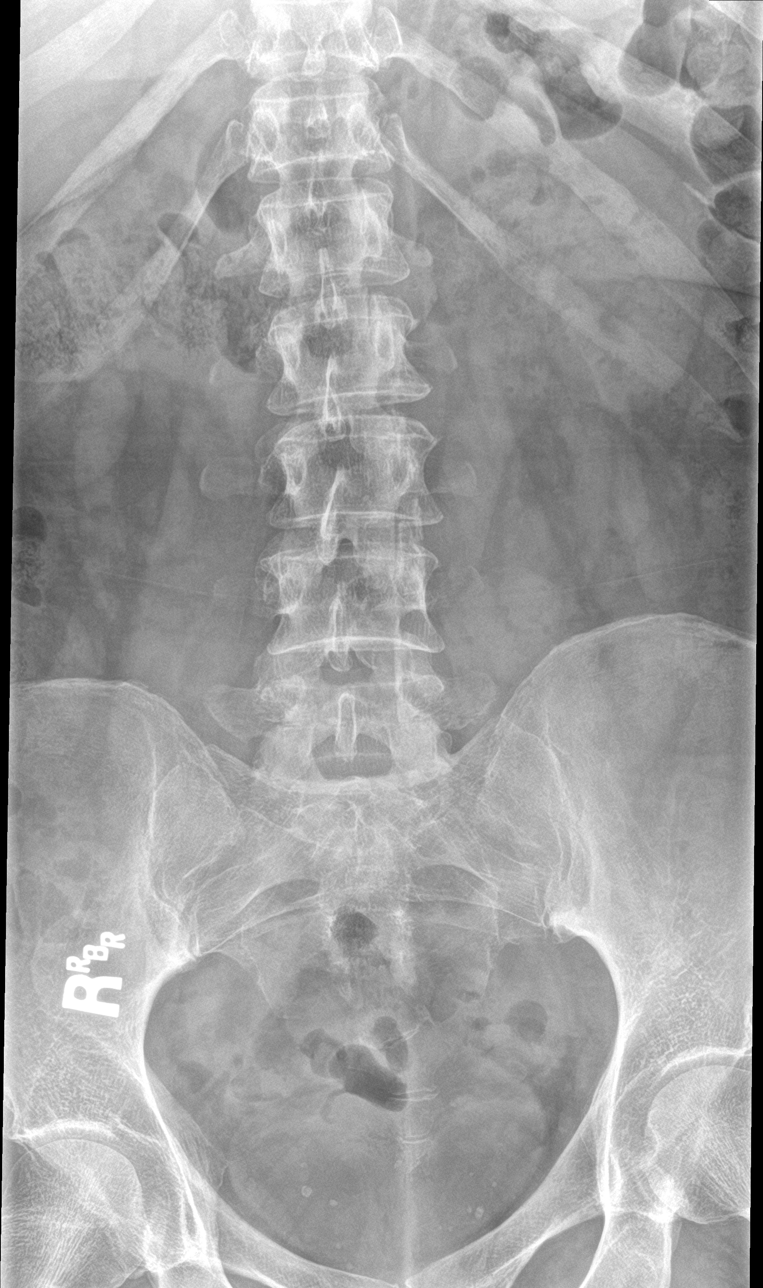

[l-spine obl (1 of 2)]
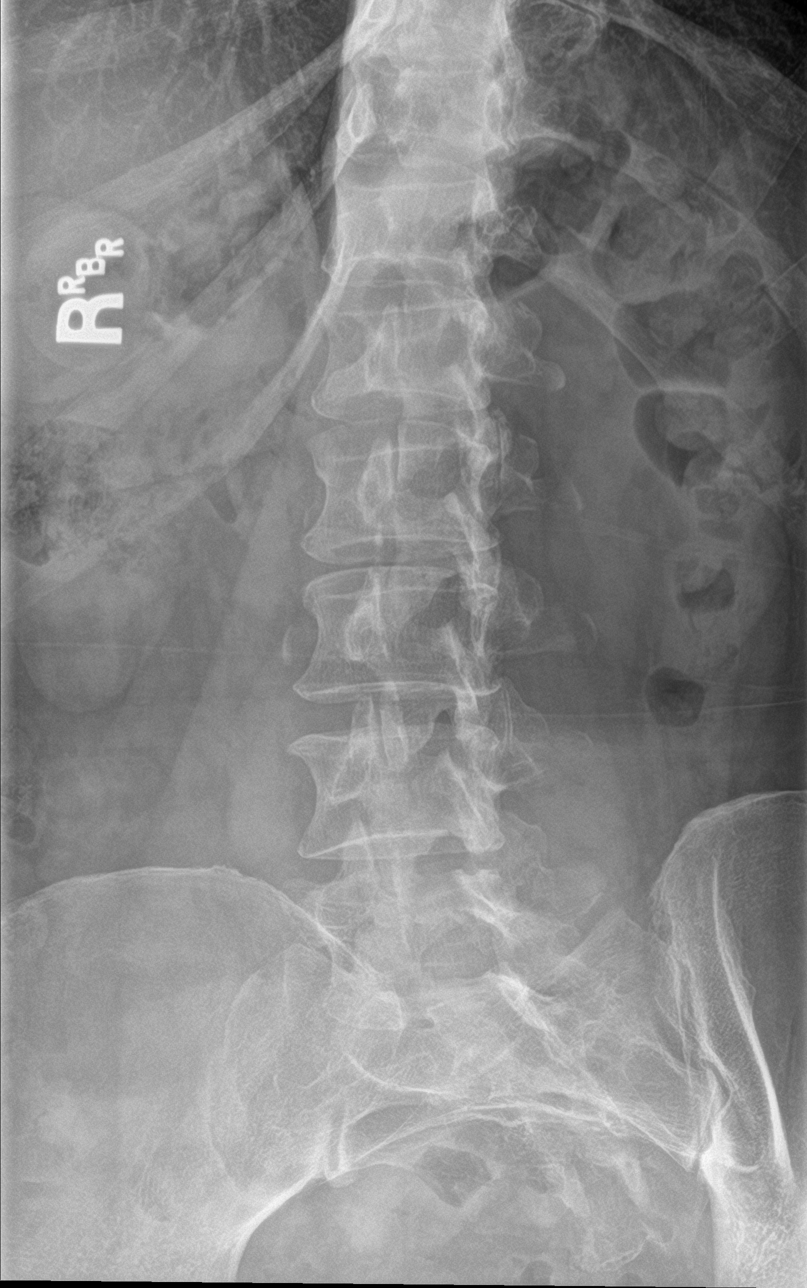

[l-spine obl (2 of 2)]
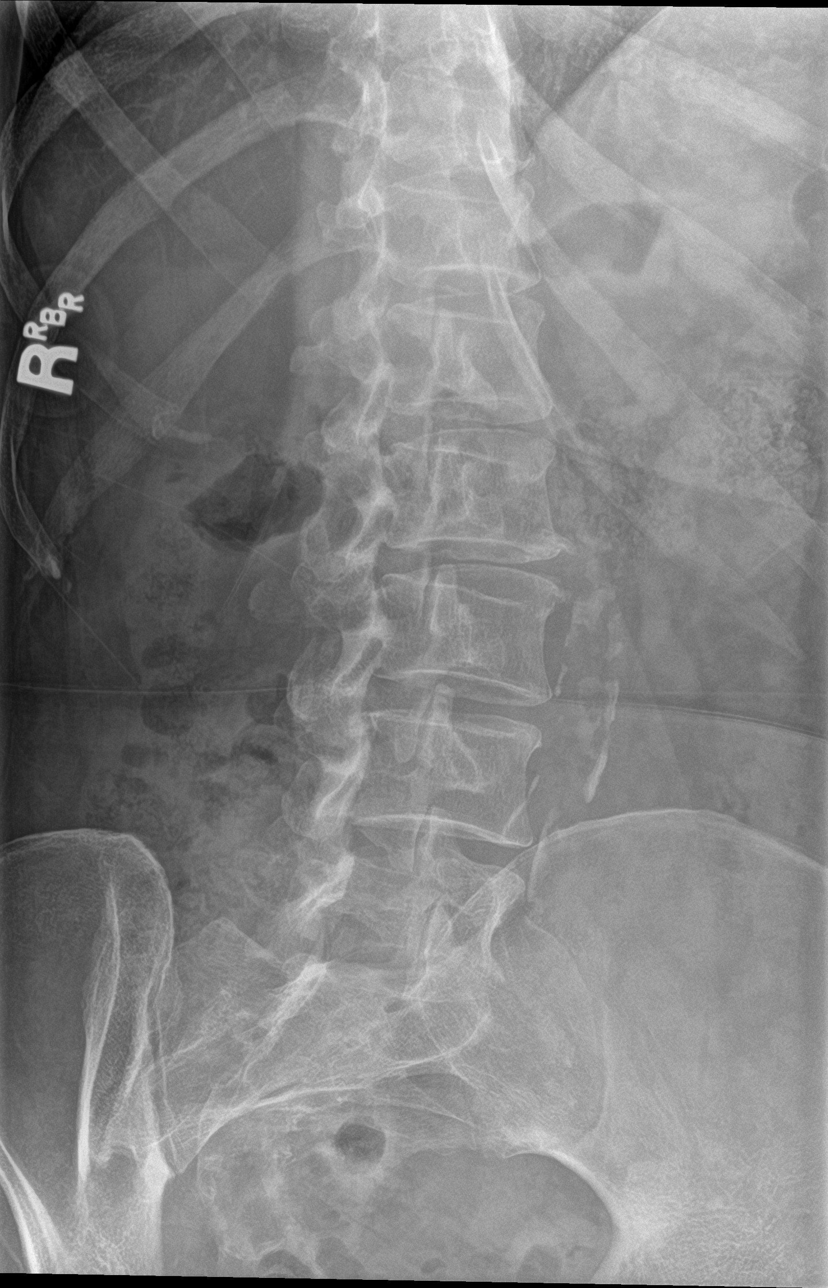

[l-spine lat]
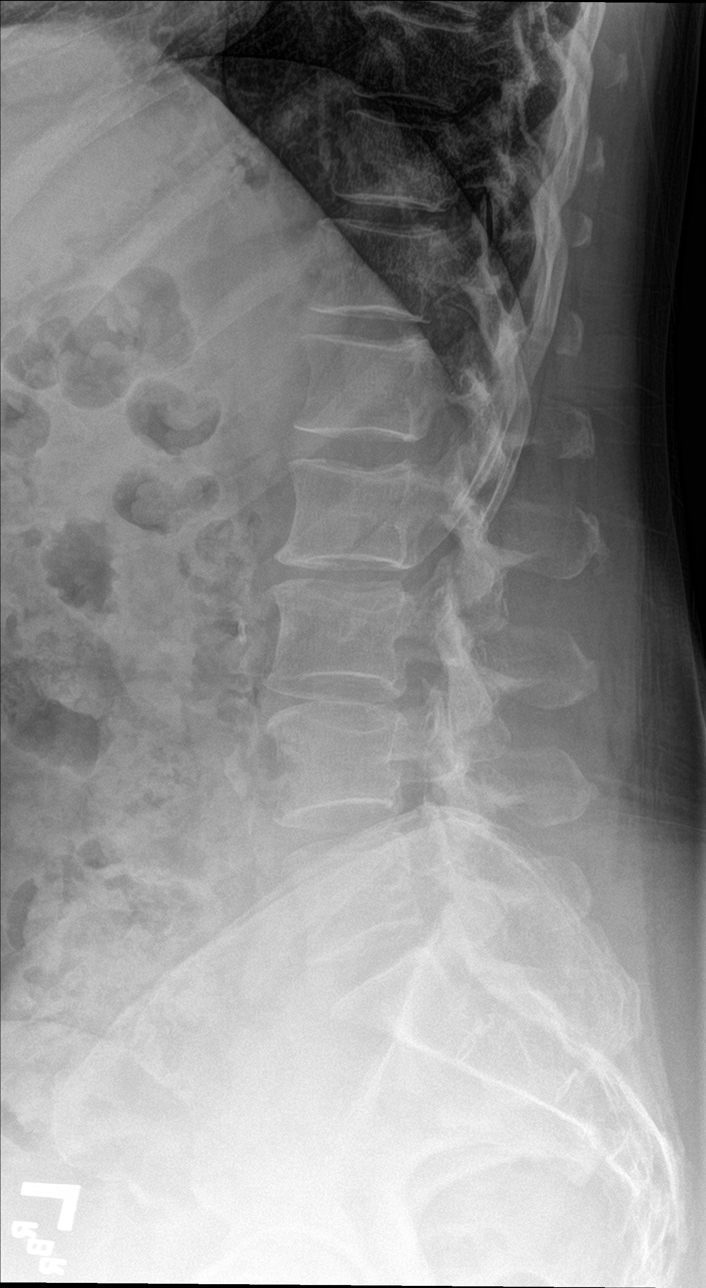

[l-spine spot]
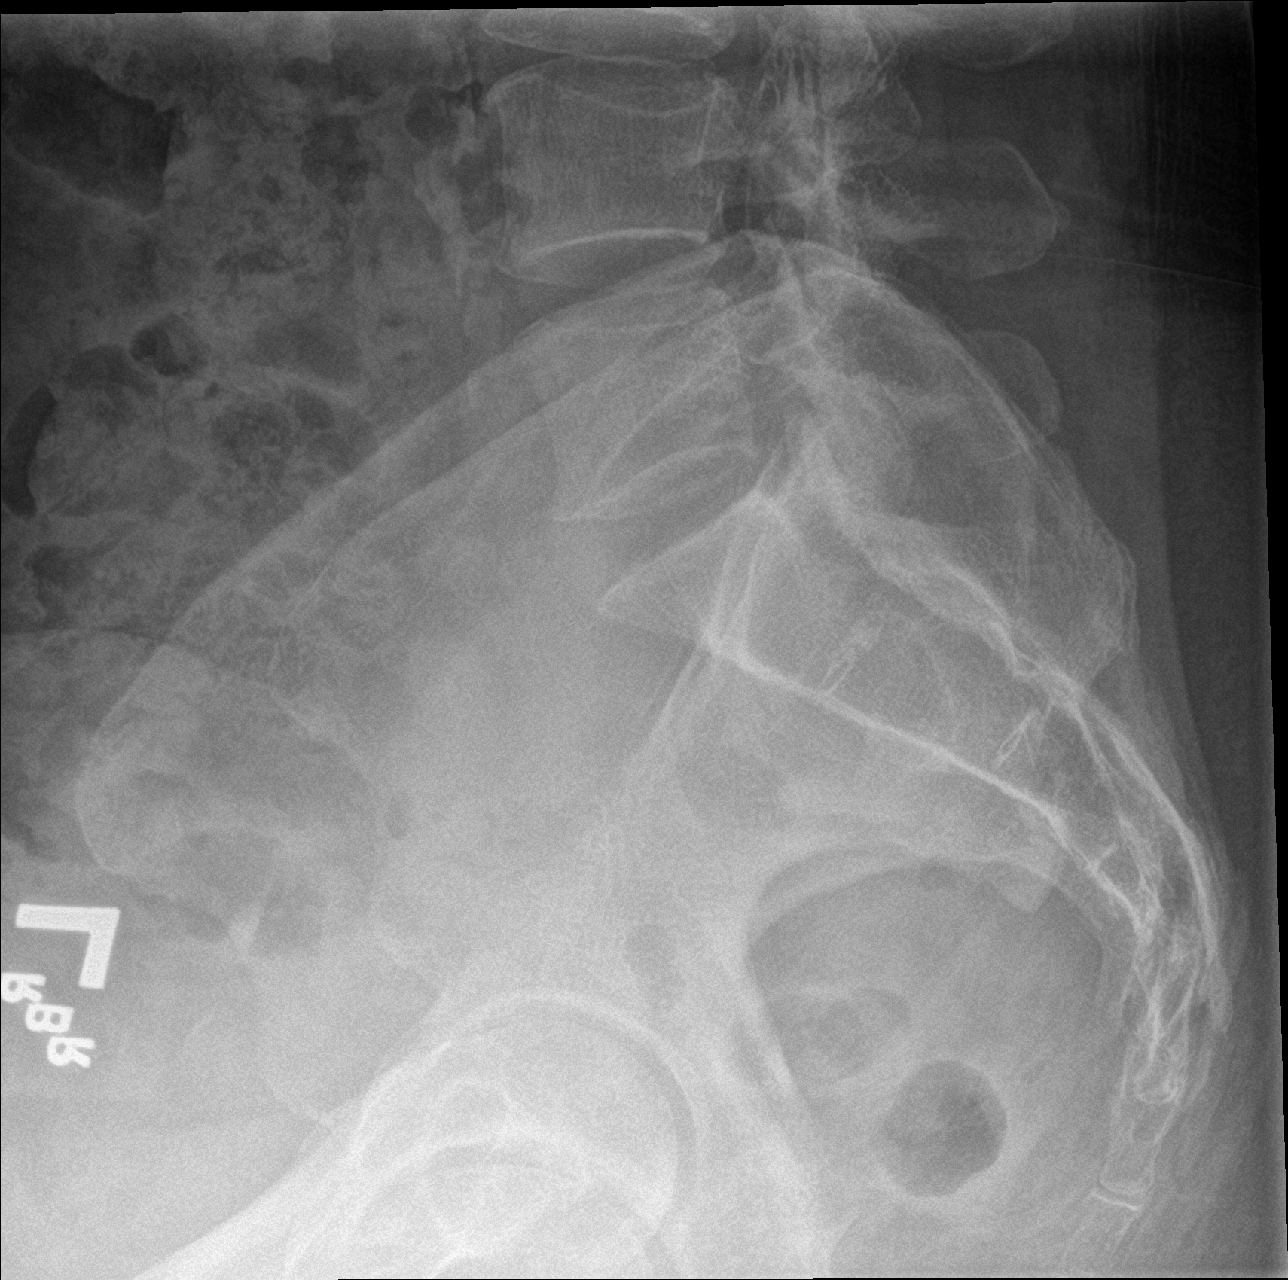

[5 of 5 positions shown; findings below may reference images not displayed]

FINDINGS: Frontal, lateral, spot lumbosacral lateral, and bilateral oblique
views were obtained. There are 5 non-rib-bearing lumbar type
vertebral bodies. There is no fracture or spondylolisthesis. There
is mild disc space narrowing at L2-3 and L3-4. There is facet
osteoarthritic change at L5-S1 bilaterally. There is aortic
atherosclerosis.
IMPRESSION: Areas of relatively mild osteoarthritic change. No fracture or
spondylolisthesis. There is aortic atherosclerosis.

## 2018-04-06 DIAGNOSIS — J069 Acute upper respiratory infection, unspecified: Secondary | ICD-10-CM | POA: Diagnosis not present

## 2018-04-06 DIAGNOSIS — J209 Acute bronchitis, unspecified: Secondary | ICD-10-CM | POA: Diagnosis not present

## 2018-04-06 DIAGNOSIS — K080 Exfoliation of teeth due to systemic causes: Secondary | ICD-10-CM | POA: Diagnosis not present

## 2018-04-11 ENCOUNTER — Ambulatory Visit: Payer: Federal, State, Local not specified - PPO | Admitting: Family Medicine

## 2018-04-11 ENCOUNTER — Encounter: Payer: Self-pay | Admitting: Family Medicine

## 2018-04-11 VITALS — BP 127/69 | HR 62 | Temp 98.2°F | Resp 16 | Ht 65.0 in | Wt 204.8 lb

## 2018-04-11 DIAGNOSIS — M722 Plantar fascial fibromatosis: Secondary | ICD-10-CM | POA: Diagnosis not present

## 2018-04-11 NOTE — Progress Notes (Signed)
Patient ID: [name] Ramos, female    DOB: 12-06-1965  Age: 52 y.o. MRN: [id number]    Subjective:  Subjective  HPI [name] Ramos presents for pain in R heel and stiffness in toes of left foot.  She had some swelling as well but that is better.  Pain is worse when she first gets up in am and puts her feet down and then it improves as she walks -- if she sits and gets back up it hurts again.  Her toes are stiff as well.  The swelling is better and that makes the toes feels better.  No sob or chest pain.    Review of Systems  Constitutional: Negative for activity change, appetite change, fatigue and unexpected weight change.  Respiratory: Negative for cough and shortness of breath.   Cardiovascular: Positive for leg swelling. Negative for chest pain and palpitations.  Musculoskeletal: Positive for gait problem.  Neurological: Negative for dizziness.  Psychiatric/Behavioral: Negative for behavioral problems and dysphoric mood. The patient is not nervous/anxious.     History Past Medical History:  Diagnosis Date  . Alcohol abuse    recovering alcoholic  . Allergic urticaria   . Anxiety   . Cholecystitis, acute   . Congenital anomaly of neck   . Depression   . Dyslipidemia    Followed by PCP  . Gastroparesis   . GERD (gastroesophageal reflux disease)   . Hematuria   . Hiatal hernia    with reflux  . Hypertension    stress test 11/2010 normal  . Kidney stone   . Obesity   . PVC's (premature ventricular contractions)    Controlled on beta blocker therapy;  Echo 9/10: EF 55-60%, grade 1 diastolic dysfunction, mild MR, mild LAE  . Thyroid nodule    hx  . Tinea corporis   . Unspecified contraceptive management   . Urinary tract infection     She has a past surgical history that includes Tonsillectomy and leep surgery.   Her family history includes Breast cancer (age of onset: 22) in her mother; Colon polyps in her mother; Coronary artery disease in her other; Diabetes in her  maternal grandmother and mother; Heart attack (age of onset: 46) in her maternal grandmother; Hyperlipidemia in her father; Hypertension in her father, mother, and other; Irritable bowel syndrome in her mother; Liver disease in her mother; Lung cancer in her maternal grandmother, maternal uncle, and paternal grandmother; Thyroid disease in her mother.She reports that she has been smoking cigarettes. She has a 24.00 pack-year smoking history. She has never used smokeless tobacco. She reports that she does not drink alcohol or use drugs.  Current Outpatient Medications on File Prior to Visit  Medication Sig Dispense Refill  . ALPRAZolam (XANAX) 0.25 MG tablet Take 1 tablet (0.25 mg total) by mouth 3 (three) times daily as needed for anxiety. 30 tablet 2  . amLODipine (NORVASC) 5 MG tablet TAKE 1 TABLET BY MOUTH EVERY DAY 90 tablet 1  . aspirin 81 MG tablet Take 81 mg by mouth daily.    Marland Kitchen atenolol (TENORMIN) 100 MG tablet TAKE 1 TABLET BY MOUTH EVERY DAY 90 tablet 0  . atorvastatin (LIPITOR) 10 MG tablet TAKE 1 TABLET BY MOUTH EVERY DAY 90 tablet 1  . clobetasol ointment (TEMOVATE) 0.05 % Apply to affected area every night for 4 weeks, then every other day for 4 weeks and then twice a week 60 g 5  . cyclobenzaprine (FLEXERIL) 10 MG tablet Take 1 tablet (10 mg  total) by mouth 3 (three) times daily as needed for muscle spasms. 30 tablet 0  . hydrochlorothiazide (HYDRODIURIL) 25 MG tablet Take 1 tablet (25 mg total) by mouth daily. 90 tablet 3  . medroxyPROGESTERone Acetate 150 MG/ML SUSY INJECT AS DIRECTED EVERY 3 MONTHS 1 Syringe 2  . omeprazole (PRILOSEC) 40 MG capsule Take 1 capsule (40 mg total) by mouth daily. 90 capsule 1  . [DISCONTINUED] oxybutynin (DITROPAN-XL) 10 MG 24 hr tablet Take 10 mg by mouth daily.     No current facility-administered medications on file prior to visit.      Objective:  Objective  Physical Exam  Constitutional: She is oriented to person, place, and time. She appears  well-developed and well-nourished.  HENT:  Head: Normocephalic and atraumatic.  Eyes: Conjunctivae and EOM are normal.  Neck: Normal range of motion. Neck supple. No JVD present. Carotid bruit is not present. No thyromegaly present.  Cardiovascular: Normal rate, regular rhythm and normal heart sounds.  No murmur heard. Pulmonary/Chest: Effort normal and breath sounds normal. No respiratory distress. She has no wheezes. She has no rales. She exhibits no tenderness.  Musculoskeletal: She exhibits no edema.  Neurological: She is alert and oriented to person, place, and time.  Psychiatric: She has a normal mood and affect.  Nursing note and vitals reviewed.  BP 127/69 (BP Location: Right Arm, Cuff Size: Large)   Pulse 62   Temp 98.2 F (36.8 C) (Oral)   Resp 16   Ht 5\' 5"  (1.651 m)   Wt 204 lb 12.8 oz (92.9 kg)   SpO2 100%   BMI 34.08 kg/m  Wt Readings from Last 3 Encounters:  04/11/18 204 lb 12.8 oz (92.9 kg)  12/17/17 201 lb 6.4 oz (91.4 kg)  09/09/17 203 lb 9.6 oz (92.4 kg)     Lab Results  Component Value Date   WBC 7.1 02/04/2017   HGB 14.3 02/04/2017   HCT 43.3 02/04/2017   PLT 232.0 02/04/2017   GLUCOSE 102 (H) 12/17/2017   CHOL 159 12/17/2017   TRIG 90.0 12/17/2017   HDL 46.70 12/17/2017   LDLDIRECT 148.1 01/20/2008   LDLCALC 95 12/17/2017   ALT 23 12/17/2017   AST 15 12/17/2017   NA 137 12/17/2017   K 3.7 12/17/2017   CL 103 12/17/2017   CREATININE 0.87 12/17/2017   BUN 10 12/17/2017   CO2 28 12/17/2017   TSH 0.90 02/04/2017    Mm Diag Breast Tomo Uni Right  Result Date: 12/24/2017 CLINICAL DATA:  52 year old female recalled from screening mammogram dated 12/17/2017 for possible right breast distortion. EXAM: DIGITAL DIAGNOSTIC UNILATERAL RIGHT MAMMOGRAM WITH CAD AND TOMO COMPARISON:  Previous exam(s). ACR Breast Density Category c: The breast tissue is heterogeneously dense, which may obscure small masses. FINDINGS: The previously described, possible  distortion in the posterior right breast on the MLO projection only resolves into well dispersed fatty and fibroglandular tissue on today's additional views. No suspicious mammographic findings are identified. Mammographic images were processed with CAD. IMPRESSION: No mammographic evidence of malignancy. RECOMMENDATION: Screening mammogram in one year.(Code:SM-B-01Y) I have discussed the findings and recommendations with the patient. Results were also provided in writing at the conclusion of the visit. If applicable, a reminder letter will be sent to the patient regarding the next appointment. BI-RADS CATEGORY  1: Negative. Electronically Signed   By: Sande Brothers M.D.   On: 12/24/2017 08:20     Assessment & Plan:  Plan  I am having Ronny Bacon maintain her  aspirin, clobetasol ointment, cyclobenzaprine, ALPRAZolam, medroxyPROGESTERone Acetate, omeprazole, hydrochlorothiazide, atorvastatin, amLODipine, and atenolol.  No orders of the defined types were placed in this encounter.   Problem List Items Addressed This Visit    None    Visit Diagnoses    Plantar fasciitis    -  Primary    use frozen water bottle to ice daily Stretch  Gel inserts/ orthotics nsaid  Pt will need better foot wear Refer to sport med/ podiatry prn Follow-up: Return if symptoms worsen or fail to improve.  Donato Schultz, DO

## 2018-04-11 NOTE — Patient Instructions (Signed)

## 2018-04-21 ENCOUNTER — Ambulatory Visit: Payer: Federal, State, Local not specified - PPO | Admitting: Interventional Cardiology

## 2018-05-02 ENCOUNTER — Other Ambulatory Visit: Payer: Self-pay | Admitting: Family Medicine

## 2018-05-02 DIAGNOSIS — Z3009 Encounter for other general counseling and advice on contraception: Secondary | ICD-10-CM

## 2018-05-17 ENCOUNTER — Encounter: Payer: Federal, State, Local not specified - PPO | Admitting: Family Medicine

## 2018-06-02 ENCOUNTER — Encounter: Payer: Self-pay | Admitting: Family Medicine

## 2018-06-06 ENCOUNTER — Other Ambulatory Visit: Payer: Self-pay | Admitting: Family Medicine

## 2018-06-06 DIAGNOSIS — E785 Hyperlipidemia, unspecified: Secondary | ICD-10-CM

## 2018-06-06 DIAGNOSIS — I10 Essential (primary) hypertension: Secondary | ICD-10-CM

## 2018-06-06 DIAGNOSIS — F320 Major depressive disorder, single episode, mild: Secondary | ICD-10-CM

## 2018-06-06 MED ORDER — BUPROPION HCL ER (XL) 150 MG PO TB24: 150.0000 mg | ORAL_TABLET | Freq: Every day | ORAL | 0 refills | Status: DC

## 2018-06-06 NOTE — Telephone Encounter (Signed)
I do not see a wellbutrin on her file.  Do you remember ever talking about this medication?

## 2018-06-06 NOTE — Telephone Encounter (Signed)
Is the app just a f/u or cpe ? I sent wellbutrin in --- 30 days

## 2018-06-08 ENCOUNTER — Other Ambulatory Visit: Payer: Self-pay | Admitting: Family Medicine

## 2018-06-08 ENCOUNTER — Other Ambulatory Visit: Payer: Self-pay | Admitting: *Deleted

## 2018-06-08 DIAGNOSIS — E785 Hyperlipidemia, unspecified: Secondary | ICD-10-CM

## 2018-06-08 DIAGNOSIS — F320 Major depressive disorder, single episode, mild: Secondary | ICD-10-CM

## 2018-06-08 MED ORDER — ATENOLOL 100 MG PO TABS: 100.0000 mg | ORAL_TABLET | Freq: Every day | ORAL | 0 refills | Status: DC

## 2018-06-08 MED ORDER — CYCLOBENZAPRINE HCL 10 MG PO TABS: 10.0000 mg | ORAL_TABLET | Freq: Three times a day (TID) | ORAL | 0 refills | Status: DC | PRN

## 2018-06-09 ENCOUNTER — Ambulatory Visit: Payer: Federal, State, Local not specified - PPO | Admitting: Interventional Cardiology

## 2018-06-09 ENCOUNTER — Encounter: Payer: Self-pay | Admitting: Interventional Cardiology

## 2018-06-09 VITALS — BP 136/82 | HR 63 | Ht 65.0 in | Wt 207.6 lb

## 2018-06-09 DIAGNOSIS — Z720 Tobacco use: Secondary | ICD-10-CM | POA: Diagnosis not present

## 2018-06-09 DIAGNOSIS — I447 Left bundle-branch block, unspecified: Secondary | ICD-10-CM

## 2018-06-09 DIAGNOSIS — E785 Hyperlipidemia, unspecified: Secondary | ICD-10-CM | POA: Diagnosis not present

## 2018-06-09 DIAGNOSIS — I10 Essential (primary) hypertension: Secondary | ICD-10-CM | POA: Diagnosis not present

## 2018-06-09 DIAGNOSIS — M25471 Effusion, right ankle: Secondary | ICD-10-CM

## 2018-06-09 NOTE — Patient Instructions (Signed)

## 2018-06-09 NOTE — Progress Notes (Signed)
Cardiology Office Note:    Date:  06/09/2018   ID:  [name] BaconHeidi Ramos, DOB July 07, 1965, MRN [id number]  PCP:  Donato SchultzLowne Chase, Yvonne R, DO  Cardiologist:  No primary care provider on file.   Referring MD: Zola ButtonLowne Chase, Grayling CongressYvonne R, *   Chief Complaint  Patient presents with  . Hyperlipidemia  . Advice Only  . Hypertension    History of Present Illness:    [name] Ramos is a 53 y.o. female with a hx of premature ventricular contractions, left bundle branch block, family history of coronary artery disease, and atypical chest pain.  Continues to have rare episodes of atypical chest pain.  No exertional component.  She denies orthopnea and PND.  Comfort is focal along the left mid sternal border.  Episodes of brief and occasionally occur right after she lies down to go to sleep at night.  She is relatively sedentary.  She works 8 to 10 hours/day at the [employer], standing.  She does not get much aerobic activity at all.  Attempts to further increase activity have been limited by the presence of plantar fasciitis in the right foot.  No medication side effects currently.  Concerned about edema in the right lower extremity.  Past Medical History:  Diagnosis Date  . Alcohol abuse    recovering alcoholic  . Allergic urticaria   . Anxiety   . Cholecystitis, acute   . Congenital anomaly of neck   . Depression   . Dyslipidemia    Followed by PCP  . Gastroparesis   . GERD (gastroesophageal reflux disease)   . Hematuria   . Hiatal hernia    with reflux  . Hypertension    stress test 11/2010 normal  . Kidney stone   . Obesity   . PVC's (premature ventricular contractions)    Controlled on beta blocker therapy;  Echo 9/10: EF 55-60%, grade 1 diastolic dysfunction, mild MR, mild LAE  . Thyroid nodule    hx  . Tinea corporis   . Unspecified contraceptive management   . Urinary tract infection     Past Surgical History:  Procedure Laterality Date  . leep surgery     mid 20's  .  TONSILLECTOMY      Current Medications: Current Meds  Medication Sig  . ALPRAZolam (XANAX) 0.25 MG tablet Take 1 tablet (0.25 mg total) by mouth 3 (three) times daily as needed for anxiety.  Marland Kitchen. amLODipine (NORVASC) 5 MG tablet TAKE 1 TABLET BY MOUTH EVERY DAY  . aspirin 81 MG tablet Take 81 mg by mouth daily.  Marland Kitchen. atenolol (TENORMIN) 100 MG tablet Take 1 tablet (100 mg total) by mouth daily.  Marland Kitchen. atorvastatin (LIPITOR) 10 MG tablet TAKE 1 TABLET BY MOUTH EVERY DAY  . buPROPion (WELLBUTRIN XL) 150 MG 24 hr tablet Take 1 tablet (150 mg total) by mouth daily.  . clobetasol ointment (TEMOVATE) 0.05 % Apply to affected area every night for 4 weeks, then every other day for 4 weeks and then twice a week  . cyclobenzaprine (FLEXERIL) 10 MG tablet Take 1 tablet (10 mg total) by mouth 3 (three) times daily as needed for muscle spasms.  . hydrochlorothiazide (HYDRODIURIL) 25 MG tablet Take 1 tablet (25 mg total) by mouth daily.  . medroxyPROGESTERone Acetate 150 MG/ML SUSY INJECT AS DIRECTED EVERY 3 MONTHS  . omeprazole (PRILOSEC) 40 MG capsule Take 1 capsule (40 mg total) by mouth daily.  . [DISCONTINUED] oxybutynin (DITROPAN-XL) 10 MG 24 hr tablet Take 10 mg by  mouth daily.     Allergies:   Ace inhibitors; Bee venom; Lisinopril; and Piroxicam   Social History   Socioeconomic History  . Marital status: Single    Spouse name: Not on file  . Number of children: Not on file  . Years of education: Not on file  . Highest education level: Not on file  Occupational History  . Occupation: post Public affairs consultant: [employer]  Social Needs  . Financial resource strain: Not on file  . Food insecurity:    Worry: Not on file    Inability: Not on file  . Transportation needs:    Medical: Not on file    Non-medical: Not on file  Tobacco Use  . Smoking status: Current Every Day Smoker    Packs/day: 1.00    Years: 24.00    Pack years: 24.00    Types: Cigarettes    Last attempt to quit: 07/02/2008      Years since quitting: 9.9  . Smokeless tobacco: Never Used  Substance and Sexual Activity  . Alcohol use: No    Alcohol/week: 0.0 standard drinks  . Drug use: No  . Sexual activity: Not Currently    Partners: Male  Lifestyle  . Physical activity:    Days per week: Not on file    Minutes per session: Not on file  . Stress: Not on file  Relationships  . Social connections:    Talks on phone: Not on file    Gets together: Not on file    Attends religious service: Not on file    Active member of club or organization: Not on file    Attends meetings of clubs or organizations: Not on file    Relationship status: Not on file  Other Topics Concern  . Not on file  Social History Narrative   Exercising---walking dog daily     Family History: The patient's family history includes Breast cancer (age of onset: 49) in her mother; Colon polyps in her mother; Coronary artery disease in an other family member; Diabetes in her maternal grandmother and mother; Heart attack (age of onset: 26) in her maternal grandmother; Hyperlipidemia in her father; Hypertension in her father, mother, and another family member; Irritable bowel syndrome in her mother; Liver disease in her mother; Lung cancer in her maternal grandmother, maternal uncle, and paternal grandmother; Thyroid disease in her mother. There is no history of Colon cancer or Stomach cancer.  ROS:   Please see the history of present illness.    Right lower extremity edema.  Plantar fasciitis right heel.  All other systems reviewed and are negative.  EKGs/Labs/Other Studies Reviewed:    The following studies were reviewed today: No new cardiac data.  EKG:  EKG is performed today and demonstrates sinus rhythm, 63 bpm, with normal EKG appearance.  Recent Labs: 12/17/2017: ALT 23; BUN 10; Creatinine, Ser 0.87; Potassium 3.7; Sodium 137  Recent Lipid Panel    Component Value Date/Time   CHOL 159 12/17/2017 0849   TRIG 90.0 12/17/2017  0849   HDL 46.70 12/17/2017 0849   CHOLHDL 3 12/17/2017 0849   VLDL 18.0 12/17/2017 0849   LDLCALC 95 12/17/2017 0849   LDLDIRECT 148.1 01/20/2008 0912    Physical Exam:   1 VS:  BP 136/82   Pulse 63   Ht 5\' 5"  (1.651 m)   Wt 207 lb 9.6 oz (94.2 kg)   SpO2 98%   BMI 34.55 kg/m  Wt Readings from Last 3 Encounters:  06/09/18 207 lb 9.6 oz (94.2 kg)  04/11/18 204 lb 12.8 oz (92.9 kg)  12/17/17 201 lb 6.4 oz (91.4 kg)     GEN: Obese. No acute distress HEENT: Normal NECK: No JVD. LYMPHATICS: No lymphadenopathy CARDIAC: RRR.  1/6 to 2/6 systolic murmur.no gallop..Trace to 1+ right ankle, foot, and shin edema.  Right lower extremity demonstrates superficial varicose veins in the region of the calf.  VASCULAR: Pulses are 2+ and symmetric in the radial and posterior tibial sites, Bruits are absent in the carotids RESPIRATORY:  Clear to auscultation without rales, wheezing or rhonchi  ABDOMEN: Soft, non-tender, non-distended, No pulsatile mass, MUSCULOSKELETAL: No deformity  SKIN: Warm and dry NEUROLOGIC:  Alert and oriented x 3 PSYCHIATRIC:  Normal affect   ASSESSMENT:    1. Essential hypertension   2. Left bundle branch block   3. Hyperlipidemia with target LDL less than 100   4. Tobacco use   5. Edema of right ankle    PLAN:    In order of problems listed above:  1. Low-salt diet, weight loss, smoking cessation all discussed as methods to help further lower the blood pressure.  Continue current therapy.  Target blood pressure consistently less than 130/80 mmHg.  If she continues to run above 140 systolic I would consider converting HCTZ to losartan HCTZ 50/12.5 mg/day. 2. Transiently present several years ago.  Not present on EKGs performed over the last 2 years. 3. LDL target should be less than 100 and preferably less than 70. 4. I encourage smoking cessation.  She will begin a hypnosis class as a method for smoking cessation.  This is worked for her in the  past. 5. Dependent edema secondary to venous insufficiency.  Recommended compression stocking, leg elevation as much as possible, and movement.  Overall education and awareness concerning primary/secondary risk prevention was discussed in detail: LDL less than 70, hemoglobin A1c less than 7, blood pressure target less than 130/80 mmHg, >150 minutes of moderate aerobic activity per week, avoidance of smoking, weight control (via diet and exercise), and continued surveillance/management of/for obstructive sleep apnea.  Greater than 50% of the time during this office visit was spent in education, counseling, and coordination of care related to underlying disease process and testing as outlined.  1 year follow-up for continued risk prevention and cardiovascular health maintenance   Medication Adjustments/Labs and Tests Ordered: Current medicines are reviewed at length with the patient today.  Concerns regarding medicines are outlined above.  Orders Placed This Encounter  Procedures  . EKG 12-Lead   No orders of the defined types were placed in this encounter.   Patient Instructions  Medication Instructions:  Your physician recommends that you continue on your current medications as directed. Please refer to the Current Medication list given to you today.  If you need a refill on your cardiac medications before your next appointment, please call your pharmacy.   Lab work: None If you have labs (blood work) drawn today and your tests are completely normal, you will receive your results only by: Marland Kitchen MyChart Message (if you have MyChart) OR . A paper copy in the mail If you have any lab test that is abnormal or we need to change your treatment, we will call you to review the results.  Testing/Procedures: None  Follow-Up: At [facility], you and your health needs are our priority.  As part of our continuing mission to provide you with exceptional heart care,  we have created designated  Provider Care Teams.  These Care Teams include your primary Cardiologist (physician) and Advanced Practice Providers (APPs -  Physician Assistants and Nurse Practitioners) who all work together to provide you with the care you need, when you need it. You will need a follow up appointment in 12 months.  Please call our office 2 months in advance to schedule this appointment.  You may see Dr. Katrinka BlazingSmith or one of the following Advanced Practice Providers on your designated Care Team:   Norma FredricksonLori Gerhardt, NP Nada BoozerLaura Ingold, NP . Georgie ChardJill McDaniel, NP  Any Other Special Instructions Will Be Listed Below (If Applicable).       Signed, Lesleigh NoeHenry W Jaycee Mckellips III, MD  06/09/2018 5:16 PM     [facility]

## 2018-06-20 ENCOUNTER — Other Ambulatory Visit (INDEPENDENT_AMBULATORY_CARE_PROVIDER_SITE_OTHER): Payer: Federal, State, Local not specified - PPO

## 2018-06-20 DIAGNOSIS — I10 Essential (primary) hypertension: Secondary | ICD-10-CM

## 2018-06-20 DIAGNOSIS — F320 Major depressive disorder, single episode, mild: Secondary | ICD-10-CM

## 2018-06-20 DIAGNOSIS — E785 Hyperlipidemia, unspecified: Secondary | ICD-10-CM

## 2018-06-20 LAB — CBC WITH DIFFERENTIAL/PLATELET
BASOS PCT: 0.4 % (ref 0.0–3.0)
Basophils Absolute: 0 10*3/uL (ref 0.0–0.1)
Eosinophils Absolute: 0.1 10*3/uL (ref 0.0–0.7)
Eosinophils Relative: 0.7 % (ref 0.0–5.0)
HCT: 42.8 % (ref 36.0–46.0)
Hemoglobin: 14.4 g/dL (ref 12.0–15.0)
Lymphocytes Relative: 21.1 % (ref 12.0–46.0)
Lymphs Abs: 1.7 10*3/uL (ref 0.7–4.0)
MCHC: 33.7 g/dL (ref 30.0–36.0)
MCV: 93.1 fl (ref 78.0–100.0)
Monocytes Absolute: 0.6 10*3/uL (ref 0.1–1.0)
Monocytes Relative: 6.9 % (ref 3.0–12.0)
NEUTROS PCT: 70.9 % (ref 43.0–77.0)
Neutro Abs: 5.8 10*3/uL (ref 1.4–7.7)
Platelets: 267 10*3/uL (ref 150.0–400.0)
RBC: 4.6 Mil/uL (ref 3.87–5.11)
RDW: 13.9 % (ref 11.5–15.5)
WBC: 8.2 10*3/uL (ref 4.0–10.5)

## 2018-06-20 LAB — COMPREHENSIVE METABOLIC PANEL
ALT: 20 U/L (ref 0–35)
AST: 18 U/L (ref 0–37)
Albumin: 4.2 g/dL (ref 3.5–5.2)
Alkaline Phosphatase: 57 U/L (ref 39–117)
BUN: 11 mg/dL (ref 6–23)
CHLORIDE: 103 meq/L (ref 96–112)
CO2: 27 mEq/L (ref 19–32)
Calcium: 10 mg/dL (ref 8.4–10.5)
Creatinine, Ser: 0.92 mg/dL (ref 0.40–1.20)
GFR: 64.44 mL/min (ref >60.00)
Glucose, Bld: 109 mg/dL — ABNORMAL HIGH (ref 70–99)
POTASSIUM: 4.9 meq/L (ref 3.5–5.1)
Sodium: 139 mEq/L (ref 135–145)
Total Bilirubin: 0.6 mg/dL (ref 0.2–1.2)
Total Protein: 6.6 g/dL (ref 6.0–8.3)

## 2018-06-20 LAB — LIPID PANEL
Cholesterol: 148 mg/dL (ref 0–200)
HDL: 42.3 mg/dL (ref >39.00)
LDL Cholesterol: 89 mg/dL (ref 0–99)
NonHDL: 105.56
Total CHOL/HDL Ratio: 3
Triglycerides: 81 mg/dL (ref 0.0–149.0)
VLDL: 16.2 mg/dL (ref 0.0–40.0)

## 2018-06-20 LAB — TSH: TSH: 1.05 u[IU]/mL (ref 0.35–4.50)

## 2018-06-21 ENCOUNTER — Encounter

## 2018-06-21 ENCOUNTER — Ambulatory Visit (INDEPENDENT_AMBULATORY_CARE_PROVIDER_SITE_OTHER): Payer: Federal, State, Local not specified - PPO | Admitting: Family Medicine

## 2018-06-21 ENCOUNTER — Encounter: Payer: Self-pay | Admitting: Family Medicine

## 2018-06-21 VITALS — BP 122/76 | HR 63 | Temp 98.1°F | Resp 16 | Ht 65.0 in | Wt 208.0 lb

## 2018-06-21 DIAGNOSIS — I10 Essential (primary) hypertension: Secondary | ICD-10-CM | POA: Diagnosis not present

## 2018-06-21 DIAGNOSIS — F320 Major depressive disorder, single episode, mild: Secondary | ICD-10-CM

## 2018-06-21 DIAGNOSIS — Z23 Encounter for immunization: Secondary | ICD-10-CM

## 2018-06-21 DIAGNOSIS — K219 Gastro-esophageal reflux disease without esophagitis: Secondary | ICD-10-CM | POA: Diagnosis not present

## 2018-06-21 DIAGNOSIS — Z3009 Encounter for other general counseling and advice on contraception: Secondary | ICD-10-CM

## 2018-06-21 DIAGNOSIS — Z0000 Encounter for general adult medical examination without abnormal findings: Secondary | ICD-10-CM

## 2018-06-21 DIAGNOSIS — Z79899 Other long term (current) drug therapy: Secondary | ICD-10-CM

## 2018-06-21 DIAGNOSIS — F419 Anxiety disorder, unspecified: Secondary | ICD-10-CM

## 2018-06-21 DIAGNOSIS — F411 Generalized anxiety disorder: Secondary | ICD-10-CM | POA: Diagnosis not present

## 2018-06-21 DIAGNOSIS — E785 Hyperlipidemia, unspecified: Secondary | ICD-10-CM

## 2018-06-21 MED ORDER — ALPRAZOLAM 0.25 MG PO TABS: 0.2500 mg | ORAL_TABLET | Freq: Three times a day (TID) | ORAL | 2 refills | Status: DC | PRN

## 2018-06-21 MED ORDER — CYCLOBENZAPRINE HCL 10 MG PO TABS: 10.0000 mg | ORAL_TABLET | Freq: Three times a day (TID) | ORAL | 0 refills | Status: DC | PRN

## 2018-06-21 MED ORDER — ATENOLOL 100 MG PO TABS: 100.0000 mg | ORAL_TABLET | Freq: Every day | ORAL | 0 refills | Status: DC

## 2018-06-21 MED ORDER — HYDROCHLOROTHIAZIDE 25 MG PO TABS: 25.0000 mg | ORAL_TABLET | Freq: Every day | ORAL | 3 refills | Status: DC

## 2018-06-21 MED ORDER — AMLODIPINE BESYLATE 5 MG PO TABS: 5.0000 mg | ORAL_TABLET | Freq: Every day | ORAL | 1 refills | Status: DC

## 2018-06-21 MED ORDER — OMEPRAZOLE 40 MG PO CPDR: 40.0000 mg | DELAYED_RELEASE_CAPSULE | Freq: Every day | ORAL | 1 refills | Status: DC

## 2018-06-21 MED ORDER — ATORVASTATIN CALCIUM 10 MG PO TABS: 10.0000 mg | ORAL_TABLET | Freq: Every day | ORAL | 1 refills | Status: DC

## 2018-06-21 MED ORDER — MEDROXYPROGESTERONE ACETATE 150 MG/ML IM SUSY: 1.0000 mL | PREFILLED_SYRINGE | INTRAMUSCULAR | 11 refills | Status: DC

## 2018-06-21 MED ORDER — BUPROPION HCL ER (XL) 150 MG PO TB24: 150.0000 mg | ORAL_TABLET | Freq: Every day | ORAL | 0 refills | Status: DC

## 2018-06-21 NOTE — Patient Instructions (Signed)
Preventive Care 40-64 Years, Female Preventive care refers to lifestyle choices and visits with your health care provider that can promote health and wellness. What does preventive care include?   A yearly physical exam. This is also called an annual well check.  Dental exams once or twice a year.  Routine eye exams. Ask your health care provider how often you should have your eyes checked.  Personal lifestyle choices, including: ? Daily care of your teeth and gums. ? Regular physical activity. ? Eating a healthy diet. ? Avoiding tobacco and drug use. ? Limiting alcohol use. ? Practicing safe sex. ? Taking low-dose aspirin daily starting at age 57. ? Taking vitamin and mineral supplements as recommended by your health care provider. What happens during an annual well check? The services and screenings done by your health care provider during your annual well check will depend on your age, overall health, lifestyle risk factors, and family history of disease. Counseling Your health care provider may ask you questions about your:  Alcohol use.  Tobacco use.  Drug use.  Emotional well-being.  Home and relationship well-being.  Sexual activity.  Eating habits.  Work and work Statistician.  Method of birth control.  Menstrual cycle.  Pregnancy history. Screening You may have the following tests or measurements:  Height, weight, and BMI.  Blood pressure.  Lipid and cholesterol levels. These may be checked every 5 years, or more frequently if you are over 107 years old.  Skin check.  Lung cancer screening. You may have this screening every year starting at age 37 if you have a 30-pack-year history of smoking and currently smoke or have quit within the past 15 years.  Colorectal cancer screening. All adults should have this screening starting at age 24 and continuing until age 70. Your health care provider may recommend screening at age 1. You will have tests every  1-10 years, depending on your results and the type of screening test. People at increased risk should start screening at an earlier age. Screening tests may include: ? Guaiac-based fecal occult blood testing. ? Fecal immunochemical test (FIT). ? Stool DNA test. ? Virtual colonoscopy. ? Sigmoidoscopy. During this test, a flexible tube with a tiny camera (sigmoidoscope) is used to examine your rectum and lower colon. The sigmoidoscope is inserted through your anus into your rectum and lower colon. ? Colonoscopy. During this test, a long, thin, flexible tube with a tiny camera (colonoscope) is used to examine your entire colon and rectum.  Hepatitis C blood test.  Hepatitis B blood test.  Sexually transmitted disease (STD) testing.  Diabetes screening. This is done by checking your blood sugar (glucose) after you have not eaten for a while (fasting). You may have this done every 1-3 years.  Mammogram. This may be done every 1-2 years. Talk to your health care provider about when you should start having regular mammograms. This may depend on whether you have a family history of breast cancer.  BRCA-related cancer screening. This may be done if you have a family history of breast, ovarian, tubal, or peritoneal cancers.  Pelvic exam and Pap test. This may be done every 3 years starting at age 67. Starting at age 62, this may be done every 5 years if you have a Pap test in combination with an HPV test.  Bone density scan. This is done to screen for osteoporosis. You may have this scan if you are at high risk for osteoporosis. Discuss your test results, treatment options,  and if necessary, the need for more tests with your health care provider. Vaccines Your health care provider may recommend certain vaccines, such as:  Influenza vaccine. This is recommended every year.  Tetanus, diphtheria, and acellular pertussis (Tdap, Td) vaccine. You may need a Td booster every 10 years.  Varicella  vaccine. You may need this if you have not been vaccinated.  Zoster vaccine. You may need this after age 44.  Measles, mumps, and rubella (MMR) vaccine. You may need at least one dose of MMR if you were born in 1957 or later. You may also need a second dose.  Pneumococcal 13-valent conjugate (PCV13) vaccine. You may need this if you have certain conditions and were not previously vaccinated.  Pneumococcal polysaccharide (PPSV23) vaccine. You may need one or two doses if you smoke cigarettes or if you have certain conditions.  Meningococcal vaccine. You may need this if you have certain conditions.  Hepatitis A vaccine. You may need this if you have certain conditions or if you travel or work in places where you may be exposed to hepatitis A.  Hepatitis B vaccine. You may need this if you have certain conditions or if you travel or work in places where you may be exposed to hepatitis B.  Haemophilus influenzae type b (Hib) vaccine. You may need this if you have certain conditions. Talk to your health care provider about which screenings and vaccines you need and how often you need them. This information is not intended to replace advice given to you by your health care provider. Make sure you discuss any questions you have with your health care provider. Document Released: 06/14/2015 Document Revised: 07/08/2017 Document Reviewed: 03/19/2015 Elsevier Interactive Patient Education  2019 [employer].

## 2018-06-21 NOTE — Progress Notes (Signed)
Subjective:     [name] BaconHeidi Ramos is a 53 y.o. female and is here for a comprehensive physical exam. The patient reports no new problems.  pt saw cardiology .  Social History   Socioeconomic History  . Marital status: Single    Spouse name: Not on file  . Number of children: Not on file  . Years of education: Not on file  . Highest education level: Not on file  Occupational History  . Occupation: post Public affairs consultantoffice    Employer: [employer]  Social Needs  . Financial resource strain: Not on file  . Food insecurity:    Worry: Not on file    Inability: Not on file  . Transportation needs:    Medical: Not on file    Non-medical: Not on file  Tobacco Use  . Smoking status: Former Smoker    Packs/day: 1.00    Years: 24.00    Pack years: 24.00    Types: Cigarettes    Last attempt to quit: 06/16/2018    Years since quitting: 0.0  . Smokeless tobacco: Never Used  Substance and Sexual Activity  . Alcohol use: No    Alcohol/week: 0.0 standard drinks  . Drug use: No  . Sexual activity: Not Currently    Partners: Male  Lifestyle  . Physical activity:    Days per week: Not on file    Minutes per session: Not on file  . Stress: Not on file  Relationships  . Social connections:    Talks on phone: Not on file    Gets together: Not on file    Attends religious service: Not on file    Active member of club or organization: Not on file    Attends meetings of clubs or organizations: Not on file    Relationship status: Not on file  . Intimate partner violence:    Fear of current or ex partner: Not on file    Emotionally abused: Not on file    Physically abused: Not on file    Forced sexual activity: Not on file  Other Topics Concern  . Not on file  Social History Narrative   Exercising---walking dog daily   Health Maintenance  Topic Date Due  . COLONOSCOPY  10/15/2017  . INFLUENZA VACCINE  12/31/2018 (Originally 12/30/2017)  . HIV Screening  08/28/2035 (Originally 10/16/1982)  .  MAMMOGRAM  12/18/2019  . PAP SMEAR-Modifier  02/05/2020  . TETANUS/TDAP  02/05/2027    The following portions of the patient's history were reviewed and updated as appropriate: She  has a past medical history of Alcohol abuse, Allergic urticaria, Anxiety, Cholecystitis, acute, Congenital anomaly of neck, Depression, Dyslipidemia, Gastroparesis, GERD (gastroesophageal reflux disease), Hematuria, Hiatal hernia, Hypertension, Kidney stone, Obesity, PVC's (premature ventricular contractions), Thyroid nodule, Tinea corporis, Unspecified contraceptive management, and Urinary tract infection. She does not have any pertinent problems on file. She  has a past surgical history that includes Tonsillectomy and leep surgery. Her family history includes Breast cancer (age of onset: 7665) in her mother; Colon polyps in her mother; Coronary artery disease in an other family member; Diabetes in her maternal grandmother and mother; Heart attack (age of onset: 5748) in her maternal grandmother; Hyperlipidemia in her father; Hypertension in her father, mother, and another family member; Irritable bowel syndrome in her mother; Liver disease in her mother; Lung cancer in her maternal grandmother, maternal uncle, and paternal grandmother; Thyroid disease in her mother. She  reports that she quit smoking 5 days ago.  Her smoking use included cigarettes. She has a 24.00 pack-year smoking history. She has never used smokeless tobacco. She reports that she does not drink alcohol or use drugs. She has a current medication list which includes the following prescription(s): alprazolam, amlodipine, aspirin, atenolol, atorvastatin, bupropion, clobetasol ointment, cyclobenzaprine, hydrochlorothiazide, medroxyprogesterone acetate, omeprazole, and oxybutynin. Current Outpatient Medications on File Prior to Visit  Medication Sig Dispense Refill  . aspirin 81 MG tablet Take 81 mg by mouth daily.    . clobetasol ointment (TEMOVATE) 0.05 % Apply  to affected area every night for 4 weeks, then every other day for 4 weeks and then twice a week 60 g 5  . [DISCONTINUED] oxybutynin (DITROPAN-XL) 10 MG 24 hr tablet Take 10 mg by mouth daily.     No current facility-administered medications on file prior to visit.    She is allergic to ace inhibitors; bee venom; lisinopril; and piroxicam..  Review of Systems Review of Systems  Constitutional: Negative for activity change, appetite change and fatigue.  HENT: Negative for hearing loss, congestion, tinnitus and ear discharge.  dentist q4m Eyes: Negative for visual disturbance (see optho q1y -- vision corrected to 20/20 with glasses).  Respiratory: Negative for cough, chest tightness and shortness of breath.   Cardiovascular: Negative for chest pain, palpitations and leg swelling.  Gastrointestinal: Negative for abdominal pain, diarrhea, constipation and abdominal distention.  Genitourinary: Negative for urgency, frequency, decreased urine volume and difficulty urinating.  Musculoskeletal: Negative for back pain, arthralgias and gait problem.  Skin: Negative for color change, pallor and rash.  Neurological: Negative for dizziness, light-headedness, numbness and headaches.  Hematological: Negative for adenopathy. Does not bruise/bleed easily.  Psychiatric/Behavioral: Negative for suicidal ideas, confusion, sleep disturbance, self-injury, dysphoric mood, decreased concentration and agitation.       Objective:    BP 122/76 (BP Location: Right Arm, Cuff Size: Large)   Pulse 63   Temp 98.1 F (36.7 C) (Oral)   Resp 16   Ht 5\' 5"  (1.651 m)   Wt 208 lb (94.3 kg)   SpO2 98%   BMI 34.61 kg/m  General appearance: alert, cooperative, appears stated age and no distress Head: Normocephalic, without obvious abnormality, atraumatic Eyes: conjunctivae/corneas clear. PERRL, EOM's intact. Fundi benign. Ears: normal TM's and external ear canals both ears Nose: Nares normal. Septum midline. Mucosa  normal. No drainage or sinus tenderness. Throat: lips, mucosa, and tongue normal; teeth and gums normal Neck: no adenopathy, no carotid bruit, no JVD, supple, symmetrical, trachea midline and thyroid not enlarged, symmetric, no tenderness/mass/nodules Back: symmetric, no curvature. ROM normal. No CVA tenderness. Lungs: clear to auscultation bilaterally Breasts: gyn Heart: regular rate and rhythm, S1, S2 normal, no murmur, click, rub or gallop Abdomen: soft, non-tender; bowel sounds normal; no masses,  no organomegaly Pelvic: deferred--gyn Extremities: extremities normal, atraumatic, no cyanosis or edema Pulses: 2+ and symmetric Skin: Skin color, texture, turgor normal. No rashes or lesions Lymph nodes: Cervical, supraclavicular, and axillary nodes normal. Neurologic: Alert and oriented X 3, normal strength and tone. Normal symmetric reflexes. Normal coordination and gait    Assessment:    Healthy female exam.     Plan:    ghm utd Check labs See After Visit Summary for Counseling Recommendations    1. Preventative health care See above - Ambulatory referral to Gastroenterology - Lipid panel; Future - Comprehensive metabolic panel; Future - POCT Urinalysis Dipstick (Automated); Future  2. Hyperlipidemia LDL goal <100 Encouraged heart healthy diet, increase exercise, avoid trans fats, consider a  krill oil cap daily - Lipid panel; Future - Comprehensive metabolic panel; Future - POCT Urinalysis Dipstick (Automated); Future - atorvastatin (LIPITOR) 10 MG tablet; Take 1 tablet (10 mg total) by mouth daily.  Dispense: 90 tablet; Refill: 1  3. Essential hypertension Well controlled, no changes to meds. Encouraged heart healthy diet such as the DASH diet and exercise as tolerated.  - Lipid panel; Future - Comprehensive metabolic panel; Future - POCT Urinalysis Dipstick (Automated); Future - hydrochlorothiazide (HYDRODIURIL) 25 MG tablet; Take 1 tablet (25 mg total) by mouth daily.   Dispense: 90 tablet; Refill: 3 - atenolol (TENORMIN) 100 MG tablet; Take 1 tablet (100 mg total) by mouth daily.  Dispense: 90 tablet; Refill: 0 - amLODipine (NORVASC) 5 MG tablet; Take 1 tablet (5 mg total) by mouth daily.  Dispense: 90 tablet; Refill: 1  4. Gastroesophageal reflux disease, esophagitis presence not specified stable - omeprazole (PRILOSEC) 40 MG capsule; Take 1 capsule (40 mg total) by mouth daily.  Dispense: 90 capsule; Refill: 1  5. Encounter for other general counseling or advice on contraception  - medroxyPROGESTERone Acetate 150 MG/ML SUSY; Inject 1 mL (150 mg total) as directed every 3 (three) months for 1 day.  Dispense: 1 Syringe; Refill: 11  6. Depression, major, single episode, mild (HCC)  - buPROPion (WELLBUTRIN XL) 150 MG 24 hr tablet; Take 1 tablet (150 mg total) by mouth daily.  Dispense: 30 tablet; Refill: 0  7. Generalized anxiety disorder  - ALPRAZolam (XANAX) 0.25 MG tablet; Take 1 tablet (0.25 mg total) by mouth 3 (three) times daily as needed for anxiety.  Dispense: 30 tablet; Refill: 2 - Pain Mgmt, Profile 8 w/Conf, U  8. Anxiety   9. High risk medication use  - Pain Mgmt, Profile 8 w/Conf, U  10. Need for shingles vaccine  - Varicella-zoster vaccine IM (Shingrix)

## 2018-06-22 LAB — PAIN MGMT, PROFILE 8 W/CONF, U
6 Acetylmorphine: NEGATIVE ng/mL (ref <10)
Alcohol Metabolites: NEGATIVE ng/mL (ref <500)
Amphetamines: NEGATIVE ng/mL (ref <500)
BENZODIAZEPINES: NEGATIVE ng/mL (ref <100)
BUPRENORPHINE, URINE: NEGATIVE ng/mL (ref <5)
Cocaine Metabolite: NEGATIVE ng/mL (ref <150)
Creatinine: 21.2 mg/dL
MDMA: NEGATIVE ng/mL (ref <500)
Marijuana Metabolite: NEGATIVE ng/mL (ref <20)
Opiates: NEGATIVE ng/mL (ref <100)
Oxidant: NEGATIVE ug/mL (ref <200)
Oxycodone: NEGATIVE ng/mL (ref <100)
pH: 6.37 (ref 4.5–9.0)

## 2018-06-29 DIAGNOSIS — D485 Neoplasm of uncertain behavior of skin: Secondary | ICD-10-CM | POA: Diagnosis not present

## 2018-06-29 DIAGNOSIS — I789 Disease of capillaries, unspecified: Secondary | ICD-10-CM | POA: Diagnosis not present

## 2018-06-29 DIAGNOSIS — L408 Other psoriasis: Secondary | ICD-10-CM | POA: Diagnosis not present

## 2018-06-30 DIAGNOSIS — D485 Neoplasm of uncertain behavior of skin: Secondary | ICD-10-CM | POA: Diagnosis not present

## 2018-06-30 DIAGNOSIS — L820 Inflamed seborrheic keratosis: Secondary | ICD-10-CM | POA: Diagnosis not present

## 2018-07-18 ENCOUNTER — Encounter: Payer: Self-pay | Admitting: Obstetrics & Gynecology

## 2018-07-18 ENCOUNTER — Ambulatory Visit (INDEPENDENT_AMBULATORY_CARE_PROVIDER_SITE_OTHER): Payer: Federal, State, Local not specified - PPO | Admitting: Obstetrics & Gynecology

## 2018-07-18 VITALS — BP 133/77 | HR 89 | Ht 65.0 in | Wt 210.0 lb

## 2018-07-18 DIAGNOSIS — Z01419 Encounter for gynecological examination (general) (routine) without abnormal findings: Secondary | ICD-10-CM | POA: Diagnosis not present

## 2018-07-18 DIAGNOSIS — Z124 Encounter for screening for malignant neoplasm of cervix: Secondary | ICD-10-CM

## 2018-07-18 DIAGNOSIS — Z1151 Encounter for screening for human papillomavirus (HPV): Secondary | ICD-10-CM

## 2018-07-18 DIAGNOSIS — N912 Amenorrhea, unspecified: Secondary | ICD-10-CM

## 2018-07-18 DIAGNOSIS — R87610 Atypical squamous cells of undetermined significance on cytologic smear of cervix (ASC-US): Secondary | ICD-10-CM

## 2018-07-18 NOTE — Progress Notes (Signed)
Subjective:     [name] Ramos is a 53 y.o. female here for a routine exam. G3P0030 LMP on Depo provera x 10 year. Current complaints: none. Pt still uses steroid cream 3 times per month. Pt has stopped smoking. She was hypnotized. She had this done prev and stopped for 5 years and another time for 2 years.     Gynecologic History No LMP recorded. Patient has had an injection. Contraception: Depo-Provera injections Last Pap: 02/04/2017  Satisfactory for evaluation endocervical/transformation zone component PRESENT.Abnormal     Diagnosis ATYPICAL SQUAMOUS CELLS OF UNDETERMINED SIGNIFICANCE (ASC-US).Abnormal    Bacterial vaginitis POSITIVE for Gardnerella vaginalisAbnormal     HPV neg  Obstetric History OB History  Gravida Para Term Preterm AB Living  3       3    SAB TAB Ectopic Multiple Live Births    3     0    # Outcome Date GA Lbr Len/2nd Weight Sex Delivery Anes PTL Lv  3 TAB           2 TAB           1 TAB            The following portions of the patient's history were reviewed and updated as appropriate: allergies, current medications, past family history, past medical history, past social history, past surgical history and problem list.  Review of Systems Pertinent items are noted in HPI.    Objective:  BP 133/77   Pulse 89   Ht 5\' 5"  (1.651 m)   Wt 210 lb (95.3 kg)   BMI 34.95 kg/m  General Appearance:    Alert, cooperative, no distress, appears stated age  Head:    Normocephalic, without obvious abnormality, atraumatic  Eyes:    conjunctiva/corneas clear, EOM's intact, both eyes  Ears:    Normal external ear canals, both ears  Nose:   Nares normal, septum midline, mucosa normal, no drainage    or sinus tenderness  Throat:   Lips, mucosa, and tongue normal; teeth and gums normal  Neck:   Supple, symmetrical, trachea midline, no adenopathy;    thyroid:  no enlargement/tenderness/nodules  Back:     Symmetric, no curvature, ROM normal, no CVA tenderness  Lungs:      Clear to auscultation bilaterally, respirations unlabored  Chest Wall:    No tenderness or deformity   Heart:    Regular rate and rhythm, S1 and S2 normal, no murmur, rub   or gallop  Breast Exam:    No tenderness, masses, or nipple abnormality  Abdomen:     Soft, non-tender, bowel sounds active all four quadrants,    no masses, no organomegaly  Genitalia:    Normal female without lesion, discharge or tenderness   No vulvar lesions noted.   Extremities:   Extremities normal, atraumatic, no cyanosis or edema  Pulses:   2+ and symmetric all extremities  Skin:   Skin color, texture, turgor normal, no rashes or lesions     Assessment:    Healthy female exam.   Amenorrhea on Depo Provera- d/w pt stopping the Depo Provera. Pt is wants to stay on the Depo provera for one more year. Pt has not been on Ca and Vit D.     Family h/o osteoporosis in mother  h/o abnormal PAP  H/o Tob cessation.    Plan:   Ca+ 1200mg  daily.  Vit D 500 daily.   F/u PAP Mammogram in July.  encouraged pt  on tob cessation   Ryon Layton L. Harraway-Smith, M.D., [place] [place]

## 2018-07-18 NOTE — Progress Notes (Signed)
Last mammo was in 11/2017-normal  Last pap 2018-abnormal

## 2018-07-20 LAB — CYTOLOGY - PAP
Diagnosis: NEGATIVE
HPV: NOT DETECTED

## 2018-07-30 ENCOUNTER — Other Ambulatory Visit: Payer: Self-pay | Admitting: Family Medicine

## 2018-07-30 DIAGNOSIS — F320 Major depressive disorder, single episode, mild: Secondary | ICD-10-CM

## 2018-08-03 DIAGNOSIS — K080 Exfoliation of teeth due to systemic causes: Secondary | ICD-10-CM | POA: Diagnosis not present

## 2018-08-23 ENCOUNTER — Encounter: Payer: Self-pay | Admitting: Family Medicine

## 2018-09-22 ENCOUNTER — Other Ambulatory Visit: Payer: Self-pay

## 2018-09-22 ENCOUNTER — Other Ambulatory Visit (INDEPENDENT_AMBULATORY_CARE_PROVIDER_SITE_OTHER): Payer: Federal, State, Local not specified - PPO

## 2018-09-22 ENCOUNTER — Ambulatory Visit (INDEPENDENT_AMBULATORY_CARE_PROVIDER_SITE_OTHER): Payer: Federal, State, Local not specified - PPO

## 2018-09-22 DIAGNOSIS — Z0000 Encounter for general adult medical examination without abnormal findings: Secondary | ICD-10-CM | POA: Diagnosis not present

## 2018-09-22 DIAGNOSIS — I10 Essential (primary) hypertension: Secondary | ICD-10-CM | POA: Diagnosis not present

## 2018-09-22 DIAGNOSIS — E785 Hyperlipidemia, unspecified: Secondary | ICD-10-CM | POA: Diagnosis not present

## 2018-09-22 DIAGNOSIS — Z23 Encounter for immunization: Secondary | ICD-10-CM

## 2018-09-22 LAB — COMPREHENSIVE METABOLIC PANEL
ALT: 20 U/L (ref 0–35)
AST: 17 U/L (ref 0–37)
Albumin: 4.4 g/dL (ref 3.5–5.2)
Alkaline Phosphatase: 63 U/L (ref 39–117)
BUN: 14 mg/dL (ref 6–23)
CO2: 28 mEq/L (ref 19–32)
Calcium: 9.5 mg/dL (ref 8.4–10.5)
Chloride: 102 mEq/L (ref 96–112)
Creatinine, Ser: 0.87 mg/dL (ref 0.40–1.20)
GFR: 68.66 mL/min (ref >60.00)
Glucose, Bld: 105 mg/dL — ABNORMAL HIGH (ref 70–99)
Potassium: 4 mEq/L (ref 3.5–5.1)
Sodium: 139 mEq/L (ref 135–145)
Total Bilirubin: 0.7 mg/dL (ref 0.2–1.2)
Total Protein: 7.1 g/dL (ref 6.0–8.3)

## 2018-09-22 LAB — LIPID PANEL
Cholesterol: 152 mg/dL (ref 0–200)
HDL: 50.5 mg/dL (ref >39.00)
LDL Cholesterol: 81 mg/dL (ref 0–99)
NonHDL: 101.31
Total CHOL/HDL Ratio: 3
Triglycerides: 100 mg/dL (ref 0.0–149.0)
VLDL: 20 mg/dL (ref 0.0–40.0)

## 2018-09-22 MED ORDER — ATENOLOL 100 MG PO TABS: 100.0000 mg | ORAL_TABLET | Freq: Every day | ORAL | 0 refills | Status: DC

## 2018-11-10 ENCOUNTER — Other Ambulatory Visit: Payer: Self-pay | Admitting: Family Medicine

## 2018-11-10 DIAGNOSIS — Z1211 Encounter for screening for malignant neoplasm of colon: Secondary | ICD-10-CM | POA: Diagnosis not present

## 2018-11-10 DIAGNOSIS — K635 Polyp of colon: Secondary | ICD-10-CM | POA: Diagnosis not present

## 2018-11-10 DIAGNOSIS — K5730 Diverticulosis of large intestine without perforation or abscess without bleeding: Secondary | ICD-10-CM | POA: Diagnosis not present

## 2018-11-10 DIAGNOSIS — Z1231 Encounter for screening mammogram for malignant neoplasm of breast: Secondary | ICD-10-CM

## 2018-11-10 DIAGNOSIS — Z8371 Family history of colonic polyps: Secondary | ICD-10-CM | POA: Diagnosis not present

## 2018-11-10 DIAGNOSIS — D126 Benign neoplasm of colon, unspecified: Secondary | ICD-10-CM | POA: Diagnosis not present

## 2018-11-10 DIAGNOSIS — K649 Unspecified hemorrhoids: Secondary | ICD-10-CM | POA: Diagnosis not present

## 2018-11-25 ENCOUNTER — Encounter: Payer: Self-pay | Admitting: Family Medicine

## 2018-11-29 NOTE — Telephone Encounter (Signed)
[name] Ramos spoke with the pt---  We can not write a note to not wear a mask  If no mask 000   She should not be at work

## 2018-12-25 ENCOUNTER — Encounter: Payer: Self-pay | Admitting: Family Medicine

## 2018-12-25 DIAGNOSIS — I10 Essential (primary) hypertension: Secondary | ICD-10-CM

## 2018-12-26 MED ORDER — ATENOLOL 100 MG PO TABS: 100.0000 mg | ORAL_TABLET | Freq: Every day | ORAL | 1 refills | Status: DC

## 2019-01-04 ENCOUNTER — Ambulatory Visit (INDEPENDENT_AMBULATORY_CARE_PROVIDER_SITE_OTHER): Payer: Federal, State, Local not specified - PPO

## 2019-01-04 ENCOUNTER — Other Ambulatory Visit: Payer: Self-pay

## 2019-01-04 DIAGNOSIS — Z1231 Encounter for screening mammogram for malignant neoplasm of breast: Secondary | ICD-10-CM | POA: Diagnosis not present

## 2019-01-25 ENCOUNTER — Other Ambulatory Visit: Payer: Self-pay | Admitting: Family Medicine

## 2019-01-25 DIAGNOSIS — F320 Major depressive disorder, single episode, mild: Secondary | ICD-10-CM

## 2019-01-25 DIAGNOSIS — K219 Gastro-esophageal reflux disease without esophagitis: Secondary | ICD-10-CM

## 2019-01-26 ENCOUNTER — Other Ambulatory Visit: Payer: Self-pay

## 2019-01-30 ENCOUNTER — Other Ambulatory Visit: Payer: Self-pay

## 2019-01-30 ENCOUNTER — Ambulatory Visit: Payer: Federal, State, Local not specified - PPO | Admitting: Family Medicine

## 2019-01-30 ENCOUNTER — Encounter: Payer: Self-pay | Admitting: Family Medicine

## 2019-01-30 ENCOUNTER — Ambulatory Visit (HOSPITAL_BASED_OUTPATIENT_CLINIC_OR_DEPARTMENT_OTHER)
Admission: RE | Admit: 2019-01-30 | Discharge: 2019-01-30 | Disposition: A | Discharge status: Home / Self Care | Payer: Federal, State, Local not specified - PPO | Source: Ambulatory Visit | Attending: Family Medicine | Admitting: Family Medicine

## 2019-01-30 VITALS — BP 123/72 | HR 61 | Temp 97.7°F | Resp 12 | Ht 65.0 in | Wt 218.2 lb

## 2019-01-30 DIAGNOSIS — R1013 Epigastric pain: Secondary | ICD-10-CM

## 2019-01-30 DIAGNOSIS — M546 Pain in thoracic spine: Secondary | ICD-10-CM | POA: Insufficient documentation

## 2019-01-30 DIAGNOSIS — M4125 Other idiopathic scoliosis, thoracolumbar region: Secondary | ICD-10-CM | POA: Insufficient documentation

## 2019-01-30 DIAGNOSIS — M4185 Other forms of scoliosis, thoracolumbar region: Secondary | ICD-10-CM | POA: Diagnosis not present

## 2019-01-30 LAB — CBC WITH DIFFERENTIAL/PLATELET
Basophils Absolute: 0 10*3/uL (ref 0.0–0.1)
Basophils Relative: 0.5 % (ref 0.0–3.0)
Eosinophils Absolute: 0.1 10*3/uL (ref 0.0–0.7)
Eosinophils Relative: 1.5 % (ref 0.0–5.0)
HCT: 41.9 % (ref 36.0–46.0)
Hemoglobin: 14.1 g/dL (ref 12.0–15.0)
Lymphocytes Relative: 22.1 % (ref 12.0–46.0)
Lymphs Abs: 2 10*3/uL (ref 0.7–4.0)
MCHC: 33.7 g/dL (ref 30.0–36.0)
MCV: 90.6 fl (ref 78.0–100.0)
Monocytes Absolute: 0.6 10*3/uL (ref 0.1–1.0)
Monocytes Relative: 7.2 % (ref 3.0–12.0)
Neutro Abs: 6.1 10*3/uL (ref 1.4–7.7)
Neutrophils Relative %: 68.7 % (ref 43.0–77.0)
Platelets: 293 10*3/uL (ref 150.0–400.0)
RBC: 4.62 Mil/uL (ref 3.87–5.11)
RDW: 13.8 % (ref 11.5–15.5)
WBC: 8.9 10*3/uL (ref 4.0–10.5)

## 2019-01-30 LAB — COMPREHENSIVE METABOLIC PANEL
ALT: 24 U/L (ref 0–35)
AST: 20 U/L (ref 0–37)
Albumin: 4.4 g/dL (ref 3.5–5.2)
Alkaline Phosphatase: 61 U/L (ref 39–117)
BUN: 12 mg/dL (ref 6–23)
CO2: 28 mEq/L (ref 19–32)
Calcium: 10.1 mg/dL (ref 8.4–10.5)
Chloride: 103 mEq/L (ref 96–112)
Creatinine, Ser: 0.84 mg/dL (ref 0.40–1.20)
GFR: 71.4 mL/min (ref >60.00)
Glucose, Bld: 95 mg/dL (ref 70–99)
Potassium: 4.1 mEq/L (ref 3.5–5.1)
Sodium: 140 mEq/L (ref 135–145)
Total Bilirubin: 0.5 mg/dL (ref 0.2–1.2)
Total Protein: 7 g/dL (ref 6.0–8.3)

## 2019-01-30 LAB — H. PYLORI ANTIBODY, IGG: H Pylori IgG: NEGATIVE

## 2019-01-30 LAB — AMYLASE: Amylase: 21 U/L — ABNORMAL LOW (ref 27–131)

## 2019-01-30 LAB — LIPASE: Lipase: 13 U/L (ref 11.0–59.0)

## 2019-01-30 MED ORDER — METHOCARBAMOL 500 MG PO TABS: 500.0000 mg | ORAL_TABLET | Freq: Four times a day (QID) | ORAL | 1 refills | Status: DC | PRN

## 2019-01-30 MED ORDER — OMEPRAZOLE 20 MG PO CPDR: 20.0000 mg | DELAYED_RELEASE_CAPSULE | Freq: Every day | ORAL | 3 refills | Status: DC

## 2019-01-30 NOTE — Progress Notes (Signed)
Patient ID: [name] BaconHeidi Ramos, female    DOB: Mar 04, 1966  Age: 53 y.o. MRN: [id number]    Subjective:  Subjective  HPI [name] BaconHeidi Ramos presents for pain in upper spine between shoulder blades.  No known injury.  Pt does have a hx of scoliosis.  She has been taking a lot of aleve/ ibuprofen and has had some gerd  Review of Systems  Constitutional: Negative for appetite change, diaphoresis, fatigue and unexpected weight change.  Eyes: Negative for pain, redness and visual disturbance.  Respiratory: Negative for cough, chest tightness, shortness of breath and wheezing.   Cardiovascular: Negative for chest pain, palpitations and leg swelling.  Endocrine: Negative for cold intolerance, heat intolerance, polydipsia, polyphagia and polyuria.  Genitourinary: Negative for difficulty urinating, dysuria and frequency.  Musculoskeletal: Positive for back pain.  Neurological: Negative for dizziness, light-headedness, numbness and headaches.    History Past Medical History:  Diagnosis Date  . Alcohol abuse    recovering alcoholic  . Allergic urticaria   . Anxiety   . Cholecystitis, acute   . Congenital anomaly of neck   . Depression   . Dyslipidemia    Followed by PCP  . Gastroparesis   . GERD (gastroesophageal reflux disease)   . Hematuria   . Hiatal hernia    with reflux  . Hypertension    stress test 11/2010 normal  . Kidney stone   . Obesity   . PVC's (premature ventricular contractions)    Controlled on beta blocker therapy;  Echo 9/10: EF 55-60%, grade 1 diastolic dysfunction, mild MR, mild LAE  . Thyroid nodule    hx  . Tinea corporis   . Unspecified contraceptive management   . Urinary tract infection     She has a past surgical history that includes Tonsillectomy and leep surgery.   Her family history includes Breast cancer (age of onset: 5565) in her mother; Colon polyps in her mother; Coronary artery disease in an other family member; Diabetes in her maternal grandmother and  mother; Heart attack (age of onset: 6048) in her maternal grandmother; Hyperlipidemia in her father; Hypertension in her father, mother, and another family member; Irritable bowel syndrome in her mother; Liver disease in her mother; Lung cancer in her maternal grandmother, maternal uncle, and paternal grandmother; Thyroid disease in her mother.She reports that she quit smoking about 7 months ago. She has never used smokeless tobacco. She reports that she does not drink alcohol or use drugs.  Current Outpatient Medications on File Prior to Visit  Medication Sig Dispense Refill  . ALPRAZolam (XANAX) 0.25 MG tablet Take 1 tablet (0.25 mg total) by mouth 3 (three) times daily as needed for anxiety. 30 tablet 2  . amLODipine (NORVASC) 5 MG tablet Take 1 tablet (5 mg total) by mouth daily. 90 tablet 1  . aspirin 81 MG tablet Take 81 mg by mouth daily.    Marland Kitchen. atenolol (TENORMIN) 100 MG tablet Take 1 tablet (100 mg total) by mouth daily. 90 tablet 1  . atorvastatin (LIPITOR) 10 MG tablet Take 1 tablet (10 mg total) by mouth daily. 90 tablet 1  . buPROPion (WELLBUTRIN XL) 150 MG 24 hr tablet TAKE 1 TABLET BY MOUTH EVERY DAY 90 tablet 1  . clobetasol ointment (TEMOVATE) 0.05 % Apply to affected area every night for 4 weeks, then every other day for 4 weeks and then twice a week 60 g 5  . hydrochlorothiazide (HYDRODIURIL) 25 MG tablet Take 1 tablet (25 mg total) by mouth daily. 90  tablet 3  . omeprazole (PRILOSEC) 40 MG capsule TAKE 1 CAPSULE BY MOUTH EVERY DAY 90 capsule 1  . medroxyPROGESTERone Acetate 150 MG/ML SUSY Inject 1 mL (150 mg total) as directed every 3 (three) months for 1 day. 1 Syringe 11   No current facility-administered medications on file prior to visit.      Objective:  Objective  Physical Exam Vitals signs and nursing note reviewed.  Constitutional:      Appearance: She is well-developed.  HENT:     Head: Normocephalic and atraumatic.  Eyes:     Conjunctiva/sclera: Conjunctivae  normal.  Neck:     Musculoskeletal: Normal range of motion and neck supple.     Thyroid: No thyromegaly.     Vascular: No carotid bruit or JVD.  Cardiovascular:     Rate and Rhythm: Normal rate and regular rhythm.     Heart sounds: Normal heart sounds. No murmur.  Pulmonary:     Effort: Pulmonary effort is normal. No respiratory distress.     Breath sounds: Normal breath sounds. No wheezing or rales.  Chest:     Chest wall: No tenderness.  Musculoskeletal:     Thoracic back: She exhibits tenderness and pain. She exhibits normal range of motion and no spasm.       Back:  Neurological:     Mental Status: She is alert and oriented to person, place, and time.    BP 123/72 (BP Location: Right Arm, Cuff Size: Large)   Pulse 61   Temp 97.7 F (36.5 C) (Temporal)   Resp 12   Ht 5\' 5"  (1.651 m)   Wt 218 lb 3.2 oz (99 kg)   SpO2 100%   BMI 36.31 kg/m  Wt Readings from Last 3 Encounters:  01/30/19 218 lb 3.2 oz (99 kg)  07/18/18 210 lb (95.3 kg)  06/21/18 208 lb (94.3 kg)     Lab Results  Component Value Date   WBC 8.2 06/20/2018   HGB 14.4 06/20/2018   HCT 42.8 06/20/2018   PLT 267.0 06/20/2018   GLUCOSE 105 (H) 09/22/2018   CHOL 152 09/22/2018   TRIG 100.0 09/22/2018   HDL 50.50 09/22/2018   LDLDIRECT 148.1 01/20/2008   LDLCALC 81 09/22/2018   ALT 20 09/22/2018   AST 17 09/22/2018   NA 139 09/22/2018   K 4.0 09/22/2018   CL 102 09/22/2018   CREATININE 0.87 09/22/2018   BUN 14 09/22/2018   CO2 28 09/22/2018   TSH 1.05 06/20/2018    Mm Diag Breast Tomo Uni Right  Result Date: 12/24/2017 CLINICAL DATA:  53 year old female recalled from screening mammogram dated 12/17/2017 for possible right breast distortion. EXAM: DIGITAL DIAGNOSTIC UNILATERAL RIGHT MAMMOGRAM WITH CAD AND TOMO COMPARISON:  Previous exam(s). ACR Breast Density Category c: The breast tissue is heterogeneously dense, which may obscure small masses. FINDINGS: The previously described, possible  distortion in the posterior right breast on the MLO projection only resolves into well dispersed fatty and fibroglandular tissue on today's additional views. No suspicious mammographic findings are identified. Mammographic images were processed with CAD. IMPRESSION: No mammographic evidence of malignancy. RECOMMENDATION: Screening mammogram in one year.(Code:SM-B-01Y) I have discussed the findings and recommendations with the patient. Results were also provided in writing at the conclusion of the visit. If applicable, a reminder letter will be sent to the patient regarding the next appointment. BI-RADS CATEGORY  1: Negative. Electronically Signed   By: Sande Brothers M.D.   On: 12/24/2017 08:20  Assessment & Plan:  Plan  I have discontinued Alveda Leggette's cyclobenzaprine. I am also having her start on omeprazole and methocarbamol. Additionally, I am having her maintain her aspirin, clobetasol ointment, medroxyPROGESTERone Acetate, hydrochlorothiazide, atorvastatin, amLODipine, ALPRAZolam, atenolol, buPROPion, and omeprazole.  Meds ordered this encounter  Medications  . omeprazole (PRILOSEC) 20 MG capsule    Sig: Take 1 capsule (20 mg total) by mouth daily.    Dispense:  30 capsule    Refill:  3  . methocarbamol (ROBAXIN) 500 MG tablet    Sig: Take 1 tablet (500 mg total) by mouth every 6 (six) hours as needed for muscle spasms.    Dispense:  30 tablet    Refill:  1    Problem List Items Addressed This Visit    None    Visit Diagnoses    Thoracic spine pain    -  Primary   Relevant Medications   methocarbamol (ROBAXIN) 500 MG tablet   Other Relevant Orders   Ambulatory referral to Orthopedic Surgery   DG SCOLIOSIS EVAL COMPLETE SPINE 2 OR 3 VIEWS (Completed)   Dyspepsia       Relevant Medications   omeprazole (PRILOSEC) 20 MG capsule   Other Relevant Orders   CBC with Differential/Platelet   H. pylori antibody, IgG   Comprehensive metabolic panel   Amylase   Lipase   Other  idiopathic scoliosis, thoracolumbar region       Relevant Orders   Ambulatory referral to Orthopedic Surgery   DG SCOLIOSIS EVAL COMPLETE SPINE 2 OR 3 VIEWS (Completed)      Follow-up: Return if symptoms worsen or fail to improve.  Ann Held, DO

## 2019-01-30 NOTE — Patient Instructions (Signed)
Scoliosis  Scoliosis is a condition in which the spine curves sideways. Normally, the spine does not curve side-to-side (laterally). With scoliosis, the spine may curve to the left, to the right, or in both directions. The curve of the spine is measured by angles in degrees. Scoliosis can affect people at any age, but it is more common among children and adolescents. What are the causes? The cause of scoliosis is not always known. It may be caused by:  A birth defect.  A disease that can cause problems in the muscles or imbalance of the body, such as cerebral palsy or muscular dystrophy. What are the signs or symptoms? This condition may not cause any symptoms. If you do have symptoms, they may include:  Leaning to one side.  Sunken chest and uneven shoulders.  One side of the body being different or larger than the other side (asymmetry).  An abnormal curve in the back.  Pain, which may limit physical activity.  Shortness of breath.  Bowel or bladder control problems, such as not knowing when you have to go. This can be a sign of nerve damage. How is this diagnosed? This condition is diagnosed based on:  Your medical history.  Your symptoms.  A physical exam. This may include: ? Examining your nerves, muscles, and reflexes (neurological exam). ? Testing the movement of your spine (range of motion study).  Imaging tests, such as: ? X-rays. ? MRI. How is this treated? Treatment for this condition depends on the severity of the symptoms. Treatment may include:  Observation to make sure that your scoliosis does not get worse (progress). You may need to have regular visits with your health care provider.  A back brace to prevent scoliosis from progressing. This may be needed during times of fast growth (growth spurts), such as during adolescence.  Medicine to help relieve pain.  Physical therapy.  Surgery. Follow these instructions at home: If you have a brace:   Wear the brace as told by your health care provider. Remove it only as told by your health care provider.  Loosen the brace if your fingers or toes tingle, become numb, or turn cold and blue.  Keep the brace clean.  If the brace is not waterproof: ? Do not let it get wet. ? Cover it with a watertight covering when you take a bath or a shower. General instructions  Take over-the-counter and prescription medicines only as told by your health care provider.  Donot drive or use heavy machinery while taking prescription pain medicine.  If physical therapy was prescribed, do exercises as instructed.  Before starting any new sports or physical activities, ask your health care provider whether they are safe for you.  Keep all follow-up visits as told by your health care provider. This is important. Contact a health care provider if you have:  Problems with your back brace, such as skin irritation or discomfort.  Back pain that does not get better with medicine. Get help right away if:  Your legs feel weak.  You cannot move your legs.  You cannot control when you urinate or pass stool (loss of bladder or bowel control). Summary  Scoliosis is a condition of having a spine that curves sideways. The spine may curve to the left, to the right, or in both directions.  This condition may be caused by birth defects or diseases that affect muscles and body balance.  Follow your health care provider's instructions about wearing a brace, doing physical   activities, and keeping follow-up visits. This information is not intended to replace advice given to you by your health care provider. Make sure you discuss any questions you have with your health care provider. Document Released: 05/15/2000 Document Revised: 10/18/2017 Document Reviewed: 09/02/2017 Elsevier Patient Education  2020 [employer]  

## 2019-01-31 ENCOUNTER — Encounter: Payer: Self-pay | Admitting: Family Medicine

## 2019-01-31 NOTE — Telephone Encounter (Signed)
Does she need something for pain ? Has the ortho app been made?

## 2019-02-01 ENCOUNTER — Ambulatory Visit: Payer: Federal, State, Local not specified - PPO | Admitting: Family Medicine

## 2019-02-01 DIAGNOSIS — M5412 Radiculopathy, cervical region: Secondary | ICD-10-CM | POA: Diagnosis not present

## 2019-02-12 ENCOUNTER — Other Ambulatory Visit: Payer: Self-pay | Admitting: Family Medicine

## 2019-02-12 DIAGNOSIS — I10 Essential (primary) hypertension: Secondary | ICD-10-CM

## 2019-02-16 DIAGNOSIS — M542 Cervicalgia: Secondary | ICD-10-CM | POA: Diagnosis not present

## 2019-02-20 ENCOUNTER — Other Ambulatory Visit: Payer: Self-pay

## 2019-02-20 ENCOUNTER — Encounter: Payer: Self-pay | Admitting: Family Medicine

## 2019-02-20 ENCOUNTER — Ambulatory Visit (INDEPENDENT_AMBULATORY_CARE_PROVIDER_SITE_OTHER): Payer: Federal, State, Local not specified - PPO

## 2019-02-20 ENCOUNTER — Ambulatory Visit: Payer: Federal, State, Local not specified - PPO | Admitting: Family Medicine

## 2019-02-20 VITALS — BP 120/78 | HR 60 | Temp 97.4°F | Resp 18 | Ht 65.0 in | Wt 220.4 lb

## 2019-02-20 DIAGNOSIS — M549 Dorsalgia, unspecified: Secondary | ICD-10-CM

## 2019-02-20 DIAGNOSIS — E785 Hyperlipidemia, unspecified: Secondary | ICD-10-CM | POA: Diagnosis not present

## 2019-02-20 DIAGNOSIS — I10 Essential (primary) hypertension: Secondary | ICD-10-CM

## 2019-02-20 DIAGNOSIS — M25519 Pain in unspecified shoulder: Secondary | ICD-10-CM | POA: Diagnosis not present

## 2019-02-20 LAB — LIPID PANEL
Cholesterol: 167 mg/dL (ref 0–200)
HDL: 57.3 mg/dL (ref >39.00)
LDL Cholesterol: 94 mg/dL (ref 0–99)
NonHDL: 109.74
Total CHOL/HDL Ratio: 3
Triglycerides: 77 mg/dL (ref 0.0–149.0)
VLDL: 15.4 mg/dL (ref 0.0–40.0)

## 2019-02-20 NOTE — Progress Notes (Signed)
Patient ID: [name] BaconHeidi Doolan, female    DOB: 1965/10/18  Age: 53 y.o. MRN: [id number]    Subjective:  Subjective  HPI [name] BaconHeidi Sheehan presents for f/u bp , chol and back pain.  She just had an MRI of her neck--- ortho thinks the pain is coming from her neck.  The pt would like her lungs checked.  She quit smoking and is not coughing but she is concerned about her lungs  No other complaints  Review of Systems  Constitutional: Negative for appetite change, diaphoresis, fatigue and unexpected weight change.  Eyes: Negative for pain, redness and visual disturbance.  Respiratory: Negative for cough, chest tightness, shortness of breath and wheezing.   Cardiovascular: Negative for chest pain, palpitations and leg swelling.  Endocrine: Negative for cold intolerance, heat intolerance, polydipsia, polyphagia and polyuria.  Genitourinary: Negative for difficulty urinating, dysuria and frequency.  Musculoskeletal: Positive for back pain.  Neurological: Negative for dizziness, light-headedness, numbness and headaches.    History Past Medical History:  Diagnosis Date  . Alcohol abuse    recovering alcoholic  . Allergic urticaria   . Anxiety   . Cholecystitis, acute   . Congenital anomaly of neck   . Depression   . Dyslipidemia    Followed by PCP  . Gastroparesis   . GERD (gastroesophageal reflux disease)   . Hematuria   . Hiatal hernia    with reflux  . Hypertension    stress test 11/2010 normal  . Kidney stone   . Obesity   . PVC's (premature ventricular contractions)    Controlled on beta blocker therapy;  Echo 9/10: EF 55-60%, grade 1 diastolic dysfunction, mild MR, mild LAE  . Thyroid nodule    hx  . Tinea corporis   . Unspecified contraceptive management   . Urinary tract infection     She has a past surgical history that includes Tonsillectomy and leep surgery.   Her family history includes Breast cancer (age of onset: 3565) in her mother; Colon polyps in her mother; Coronary  artery disease in an other family member; Diabetes in her maternal grandmother and mother; Heart attack (age of onset: 1748) in her maternal grandmother; Hyperlipidemia in her father; Hypertension in her father, mother, and another family member; Irritable bowel syndrome in her mother; Liver disease in her mother; Lung cancer in her maternal grandmother, maternal uncle, and paternal grandmother; Thyroid disease in her mother.She reports that she quit smoking about 8 months ago. She has never used smokeless tobacco. She reports that she does not drink alcohol or use drugs.  Current Outpatient Medications on File Prior to Visit  Medication Sig Dispense Refill  . ALPRAZolam (XANAX) 0.25 MG tablet Take 1 tablet (0.25 mg total) by mouth 3 (three) times daily as needed for anxiety. 30 tablet 2  . amLODipine (NORVASC) 5 MG tablet TAKE 1 TABLET BY MOUTH EVERY DAY 90 tablet 1  . aspirin 81 MG tablet Take 81 mg by mouth daily.    Marland Kitchen. atenolol (TENORMIN) 100 MG tablet Take 1 tablet (100 mg total) by mouth daily. 90 tablet 1  . atorvastatin (LIPITOR) 10 MG tablet Take 1 tablet (10 mg total) by mouth daily. 90 tablet 1  . buPROPion (WELLBUTRIN XL) 150 MG 24 hr tablet TAKE 1 TABLET BY MOUTH EVERY DAY 90 tablet 1  . clobetasol ointment (TEMOVATE) 0.05 % Apply to affected area every night for 4 weeks, then every other day for 4 weeks and then twice a week 60 g 5  .  hydrochlorothiazide (HYDRODIURIL) 25 MG tablet Take 1 tablet (25 mg total) by mouth daily. 90 tablet 3  . methocarbamol (ROBAXIN) 500 MG tablet Take 1 tablet (500 mg total) by mouth every 6 (six) hours as needed for muscle spasms. 30 tablet 1  . omeprazole (PRILOSEC) 20 MG capsule Take 1 capsule (20 mg total) by mouth daily. 30 capsule 3  . omeprazole (PRILOSEC) 40 MG capsule TAKE 1 CAPSULE BY MOUTH EVERY DAY 90 capsule 1  . medroxyPROGESTERone Acetate 150 MG/ML SUSY Inject 1 mL (150 mg total) as directed every 3 (three) months for 1 day. 1 Syringe 11   No  current facility-administered medications on file prior to visit.      Objective:  Objective  Physical Exam Vitals signs and nursing note reviewed.  Constitutional:      Appearance: She is well-developed.  HENT:     Head: Normocephalic and atraumatic.  Eyes:     Conjunctiva/sclera: Conjunctivae normal.  Neck:     Musculoskeletal: Normal range of motion and neck supple.     Thyroid: No thyromegaly.     Vascular: No carotid bruit or JVD.  Cardiovascular:     Rate and Rhythm: Normal rate and regular rhythm.     Heart sounds: Normal heart sounds. No murmur.  Pulmonary:     Effort: Pulmonary effort is normal. No respiratory distress.     Breath sounds: Normal breath sounds. No wheezing or rales.  Chest:     Chest wall: No tenderness.  Neurological:     Mental Status: She is alert and oriented to person, place, and time.    BP 120/78 (BP Location: Left Arm, Patient Position: Sitting, Cuff Size: Normal)   Pulse 60   Temp (!) 97.4 F (36.3 C) (Temporal)   Resp 18   Ht 5\' 5"  (1.651 m)   Wt 220 lb 6.4 oz (100 kg)   SpO2 98%   BMI 36.68 kg/m  Wt Readings from Last 3 Encounters:  02/20/19 220 lb 6.4 oz (100 kg)  01/30/19 218 lb 3.2 oz (99 kg)  07/18/18 210 lb (95.3 kg)     Lab Results  Component Value Date   WBC 8.9 01/30/2019   HGB 14.1 01/30/2019   HCT 41.9 01/30/2019   PLT 293.0 01/30/2019   GLUCOSE 95 01/30/2019   CHOL 152 09/22/2018   TRIG 100.0 09/22/2018   HDL 50.50 09/22/2018   LDLDIRECT 148.1 01/20/2008   LDLCALC 81 09/22/2018   ALT 24 01/30/2019   AST 20 01/30/2019   NA 140 01/30/2019   K 4.1 01/30/2019   CL 103 01/30/2019   CREATININE 0.84 01/30/2019   BUN 12 01/30/2019   CO2 28 01/30/2019   TSH 1.05 06/20/2018    Dg Scoliosis Eval Complete Spine 2 Or 3 Views  Result Date: 01/30/2019 CLINICAL DATA:  Thoracic spine history of scoliosis EXAM: DG SCOLIOSIS EVAL COMPLETE SPINE 2-3V COMPARISON:  None. FINDINGS: There is a mild levoconvex scoliotic  curvature of the thoracolumbar spine centered at L1 measuring 7 degrees. The vertebral body heights appear to be well maintained. No acute osseous abnormality is noted. IMPRESSION: Mild levoconvex scoliotic curvature of the thoracolumbar spine. Electronically Signed   By: Jonna Clark M.D.   On: 01/30/2019 11:41     Assessment & Plan:  Plan  I am having [name] Ramos maintain her aspirin, clobetasol ointment, medroxyPROGESTERone Acetate, hydrochlorothiazide, atorvastatin, ALPRAZolam, atenolol, buPROPion, omeprazole, omeprazole, methocarbamol, and amLODipine.  No orders of the defined types were placed in this  encounter.   Problem List Items Addressed This Visit      Unprioritized   Essential hypertension    Well controlled, no changes to meds. Encouraged heart healthy diet such as the DASH diet and exercise as tolerated.       Hyperlipidemia LDL goal <100    Tolerating statin, encouraged heart healthy diet, avoid trans fats, minimize simple carbs and saturated fats. Increase exercise as tolerated      Mid back pain - Primary    F/u ortho Will check cxr today to r/u lung etiology      Relevant Orders   DG Chest 2 View    Other Visit Diagnoses    Hyperlipidemia, unspecified hyperlipidemia type       Relevant Orders   Lipid panel      Follow-up: Return in about 6 months (around 08/20/2019), or if symptoms worsen or fail to improve, for annual exam, fasting.  Ann Held, DO

## 2019-02-20 NOTE — Assessment & Plan Note (Signed)
Well controlled, no changes to meds. Encouraged heart healthy diet such as the DASH diet and exercise as tolerated.  °

## 2019-02-20 NOTE — Assessment & Plan Note (Signed)
Tolerating statin, encouraged heart healthy diet, avoid trans fats, minimize simple carbs and saturated fats. Increase exercise as tolerated 

## 2019-02-20 NOTE — Assessment & Plan Note (Signed)
F/u ortho Will check cxr today to r/u lung etiology

## 2019-02-20 NOTE — Patient Instructions (Signed)
Acute Back Pain, Adult Acute back pain is sudden and usually short-lived. It is often caused by an injury to the muscles and tissues in the back. The injury may result from:  A muscle or ligament getting overstretched or torn (strained). Ligaments are tissues that connect bones to each other. Lifting something improperly can cause a back strain.  Wear and tear (degeneration) of the spinal disks. Spinal disks are circular tissue that provides cushioning between the bones of the spine (vertebrae).  Twisting motions, such as while playing sports or doing yard work.  A hit to the back.  Arthritis. You may have a physical exam, lab tests, and imaging tests to find the cause of your pain. Acute back pain usually goes away with rest and home care. Follow these instructions at home: Managing pain, stiffness, and swelling  Take over-the-counter and prescription medicines only as told by your health care provider.  Your health care provider may recommend applying ice during the first 24-48 hours after your pain starts. To do this: ? Put ice in a plastic bag. ? Place a towel between your skin and the bag. ? Leave the ice on for 20 minutes, 2-3 times a day.  If directed, apply heat to the affected area as often as told by your health care provider. Use the heat source that your health care provider recommends, such as a moist heat pack or a heating pad. ? Place a towel between your skin and the heat source. ? Leave the heat on for 20-30 minutes. ? Remove the heat if your skin turns bright red. This is especially important if you are unable to feel pain, heat, or cold. You have a greater risk of getting burned. Activity   Do not stay in bed. Staying in bed for more than 1-2 days can delay your recovery.  Sit up and stand up straight. Avoid leaning forward when you sit, or hunching over when you stand. ? If you work at a desk, sit close to it so you do not need to lean over. Keep your chin tucked  in. Keep your neck drawn back, and keep your elbows bent at a right angle. Your arms should look like the letter "L." ? Sit high and close to the steering wheel when you drive. Add lower back (lumbar) support to your car seat, if needed.  Take short walks on even surfaces as soon as you are able. Try to increase the length of time you walk each day.  Do not sit, drive, or stand in one place for more than 30 minutes at a time. Sitting or standing for long periods of time can put stress on your back.  Do not drive or use heavy machinery while taking prescription pain medicine.  Use proper lifting techniques. When you bend and lift, use positions that put less stress on your back: ? Bend your knees. ? Keep the load close to your body. ? Avoid twisting.  Exercise regularly as told by your health care provider. Exercising helps your back heal faster and helps prevent back injuries by keeping muscles strong and flexible.  Work with a physical therapist to make a safe exercise program, as recommended by your health care provider. Do any exercises as told by your physical therapist. Lifestyle  Maintain a healthy weight. Extra weight puts stress on your back and makes it difficult to have good posture.  Avoid activities or situations that make you feel anxious or stressed. Stress and anxiety increase muscle   tension and can make back pain worse. Learn ways to manage anxiety and stress, such as through exercise. General instructions  Sleep on a firm mattress in a comfortable position. Try lying on your side with your knees slightly bent. If you lie on your back, put a pillow under your knees.  Follow your treatment plan as told by your health care provider. This may include: ? Cognitive or behavioral therapy. ? Acupuncture or massage therapy. ? Meditation or yoga. Contact a health care provider if:  You have pain that is not relieved with rest or medicine.  You have increasing pain going down  into your legs or buttocks.  Your pain does not improve after 2 weeks.  You have pain at night.  You lose weight without trying.  You have a fever or chills. Get help right away if:  You develop new bowel or bladder control problems.  You have unusual weakness or numbness in your arms or legs.  You develop nausea or vomiting.  You develop abdominal pain.  You feel faint. Summary  Acute back pain is sudden and usually short-lived.  Use proper lifting techniques. When you bend and lift, use positions that put less stress on your back.  Take over-the-counter and prescription medicines and apply heat or ice as directed by your health care provider. This information is not intended to replace advice given to you by your health care provider. Make sure you discuss any questions you have with your health care provider. Document Released: 05/18/2005 Document Revised: 09/06/2018 Document Reviewed: 12/30/2016 Elsevier Patient Education  2020 [employer]  

## 2019-02-21 ENCOUNTER — Other Ambulatory Visit: Payer: Self-pay | Admitting: Family Medicine

## 2019-02-21 ENCOUNTER — Encounter: Payer: Self-pay | Admitting: Family Medicine

## 2019-02-21 DIAGNOSIS — M5441 Lumbago with sciatica, right side: Secondary | ICD-10-CM

## 2019-02-21 MED ORDER — TRAMADOL HCL 50 MG PO TABS: 50.0000 mg | ORAL_TABLET | Freq: Three times a day (TID) | ORAL | 0 refills | Status: AC | PRN

## 2019-02-21 NOTE — Telephone Encounter (Signed)
Tramadol sent in 

## 2019-02-22 DIAGNOSIS — M4802 Spinal stenosis, cervical region: Secondary | ICD-10-CM | POA: Diagnosis not present

## 2019-02-22 DIAGNOSIS — M519 Unspecified thoracic, thoracolumbar and lumbosacral intervertebral disc disorder: Secondary | ICD-10-CM | POA: Diagnosis not present

## 2019-02-22 DIAGNOSIS — M542 Cervicalgia: Secondary | ICD-10-CM | POA: Diagnosis not present

## 2019-02-22 DIAGNOSIS — M5020 Other cervical disc displacement, unspecified cervical region: Secondary | ICD-10-CM | POA: Diagnosis not present

## 2019-02-23 ENCOUNTER — Encounter: Payer: Self-pay | Admitting: Family Medicine

## 2019-02-24 NOTE — Telephone Encounter (Signed)
See labs in my chart

## 2019-02-24 NOTE — Telephone Encounter (Signed)
Pt viewed via mychart

## 2019-03-06 DIAGNOSIS — M5412 Radiculopathy, cervical region: Secondary | ICD-10-CM | POA: Diagnosis not present

## 2019-03-13 DIAGNOSIS — M5412 Radiculopathy, cervical region: Secondary | ICD-10-CM | POA: Diagnosis not present

## 2019-03-16 DIAGNOSIS — M5412 Radiculopathy, cervical region: Secondary | ICD-10-CM | POA: Diagnosis not present

## 2019-03-20 DIAGNOSIS — M5412 Radiculopathy, cervical region: Secondary | ICD-10-CM | POA: Diagnosis not present

## 2019-03-27 DIAGNOSIS — M5412 Radiculopathy, cervical region: Secondary | ICD-10-CM | POA: Diagnosis not present

## 2019-03-30 DIAGNOSIS — M4802 Spinal stenosis, cervical region: Secondary | ICD-10-CM | POA: Diagnosis not present

## 2019-03-30 DIAGNOSIS — M5020 Other cervical disc displacement, unspecified cervical region: Secondary | ICD-10-CM | POA: Diagnosis not present

## 2019-03-30 DIAGNOSIS — M519 Unspecified thoracic, thoracolumbar and lumbosacral intervertebral disc disorder: Secondary | ICD-10-CM | POA: Diagnosis not present

## 2019-03-30 DIAGNOSIS — M542 Cervicalgia: Secondary | ICD-10-CM | POA: Diagnosis not present

## 2019-04-03 DIAGNOSIS — M5412 Radiculopathy, cervical region: Secondary | ICD-10-CM | POA: Diagnosis not present

## 2019-04-11 DIAGNOSIS — M5412 Radiculopathy, cervical region: Secondary | ICD-10-CM | POA: Diagnosis not present

## 2019-04-17 DIAGNOSIS — M5412 Radiculopathy, cervical region: Secondary | ICD-10-CM | POA: Diagnosis not present

## 2019-04-24 DIAGNOSIS — M5412 Radiculopathy, cervical region: Secondary | ICD-10-CM | POA: Diagnosis not present

## 2019-04-26 ENCOUNTER — Encounter: Payer: Self-pay | Admitting: Family Medicine

## 2019-04-30 DIAGNOSIS — Z20828 Contact with and (suspected) exposure to other viral communicable diseases: Secondary | ICD-10-CM | POA: Diagnosis not present

## 2019-05-08 DIAGNOSIS — H6692 Otitis media, unspecified, left ear: Secondary | ICD-10-CM | POA: Diagnosis not present

## 2019-05-08 DIAGNOSIS — J029 Acute pharyngitis, unspecified: Secondary | ICD-10-CM | POA: Diagnosis not present

## 2019-05-09 DIAGNOSIS — M4802 Spinal stenosis, cervical region: Secondary | ICD-10-CM | POA: Diagnosis not present

## 2019-05-09 DIAGNOSIS — M542 Cervicalgia: Secondary | ICD-10-CM | POA: Diagnosis not present

## 2019-05-09 DIAGNOSIS — M5020 Other cervical disc displacement, unspecified cervical region: Secondary | ICD-10-CM | POA: Diagnosis not present

## 2019-05-09 DIAGNOSIS — M519 Unspecified thoracic, thoracolumbar and lumbosacral intervertebral disc disorder: Secondary | ICD-10-CM | POA: Diagnosis not present

## 2019-05-14 ENCOUNTER — Other Ambulatory Visit: Payer: Self-pay | Admitting: Family Medicine

## 2019-05-14 DIAGNOSIS — E785 Hyperlipidemia, unspecified: Secondary | ICD-10-CM

## 2019-06-29 ENCOUNTER — Ambulatory Visit (INDEPENDENT_AMBULATORY_CARE_PROVIDER_SITE_OTHER): Payer: Federal, State, Local not specified - PPO | Admitting: Family Medicine

## 2019-06-29 ENCOUNTER — Encounter: Payer: Self-pay | Admitting: Family Medicine

## 2019-06-29 ENCOUNTER — Other Ambulatory Visit: Payer: Self-pay

## 2019-06-29 VITALS — BP 126/78 | HR 66 | Temp 97.3°F | Resp 18 | Ht 65.0 in | Wt 223.6 lb

## 2019-06-29 DIAGNOSIS — M545 Low back pain: Secondary | ICD-10-CM

## 2019-06-29 DIAGNOSIS — E2839 Other primary ovarian failure: Secondary | ICD-10-CM | POA: Diagnosis not present

## 2019-06-29 DIAGNOSIS — Z0000 Encounter for general adult medical examination without abnormal findings: Secondary | ICD-10-CM | POA: Diagnosis not present

## 2019-06-29 DIAGNOSIS — K219 Gastro-esophageal reflux disease without esophagitis: Secondary | ICD-10-CM | POA: Diagnosis not present

## 2019-06-29 DIAGNOSIS — E559 Vitamin D deficiency, unspecified: Secondary | ICD-10-CM

## 2019-06-29 DIAGNOSIS — N904 Leukoplakia of vulva: Secondary | ICD-10-CM

## 2019-06-29 DIAGNOSIS — Z30013 Encounter for initial prescription of injectable contraceptive: Secondary | ICD-10-CM

## 2019-06-29 DIAGNOSIS — Z309 Encounter for contraceptive management, unspecified: Secondary | ICD-10-CM | POA: Insufficient documentation

## 2019-06-29 DIAGNOSIS — F411 Generalized anxiety disorder: Secondary | ICD-10-CM

## 2019-06-29 DIAGNOSIS — I10 Essential (primary) hypertension: Secondary | ICD-10-CM | POA: Diagnosis not present

## 2019-06-29 DIAGNOSIS — E785 Hyperlipidemia, unspecified: Secondary | ICD-10-CM | POA: Diagnosis not present

## 2019-06-29 DIAGNOSIS — G8929 Other chronic pain: Secondary | ICD-10-CM

## 2019-06-29 DIAGNOSIS — M5020 Other cervical disc displacement, unspecified cervical region: Secondary | ICD-10-CM

## 2019-06-29 DIAGNOSIS — T782XXA Anaphylactic shock, unspecified, initial encounter: Secondary | ICD-10-CM

## 2019-06-29 LAB — COMPREHENSIVE METABOLIC PANEL
ALT: 29 U/L (ref 0–35)
AST: 19 U/L (ref 0–37)
Albumin: 4.2 g/dL (ref 3.5–5.2)
Alkaline Phosphatase: 61 U/L (ref 39–117)
BUN: 9 mg/dL (ref 6–23)
CO2: 26 mEq/L (ref 19–32)
Calcium: 9.4 mg/dL (ref 8.4–10.5)
Chloride: 104 mEq/L (ref 96–112)
Creatinine, Ser: 0.79 mg/dL (ref 0.40–1.20)
GFR: 76.51 mL/min (ref >60.00)
Glucose, Bld: 118 mg/dL — ABNORMAL HIGH (ref 70–99)
Potassium: 3.7 mEq/L (ref 3.5–5.1)
Sodium: 137 mEq/L (ref 135–145)
Total Bilirubin: 0.6 mg/dL (ref 0.2–1.2)
Total Protein: 6.7 g/dL (ref 6.0–8.3)

## 2019-06-29 LAB — CBC WITH DIFFERENTIAL/PLATELET
Basophils Absolute: 0 10*3/uL (ref 0.0–0.1)
Basophils Relative: 0.6 % (ref 0.0–3.0)
Eosinophils Absolute: 0.1 10*3/uL (ref 0.0–0.7)
Eosinophils Relative: 0.8 % (ref 0.0–5.0)
HCT: 41.6 % (ref 36.0–46.0)
Hemoglobin: 13.9 g/dL (ref 12.0–15.0)
Lymphocytes Relative: 22 % (ref 12.0–46.0)
Lymphs Abs: 1.8 10*3/uL (ref 0.7–4.0)
MCHC: 33.4 g/dL (ref 30.0–36.0)
MCV: 91.6 fl (ref 78.0–100.0)
Monocytes Absolute: 0.4 10*3/uL (ref 0.1–1.0)
Monocytes Relative: 5.1 % (ref 3.0–12.0)
Neutro Abs: 5.8 10*3/uL (ref 1.4–7.7)
Neutrophils Relative %: 71.5 % (ref 43.0–77.0)
Platelets: 296 10*3/uL (ref 150.0–400.0)
RBC: 4.54 Mil/uL (ref 3.87–5.11)
RDW: 13.8 % (ref 11.5–15.5)
WBC: 8.1 10*3/uL (ref 4.0–10.5)

## 2019-06-29 LAB — TSH: TSH: 0.62 u[IU]/mL (ref 0.35–4.50)

## 2019-06-29 LAB — LIPID PANEL
Cholesterol: 147 mg/dL (ref 0–200)
HDL: 46.2 mg/dL (ref >39.00)
LDL Cholesterol: 80 mg/dL (ref 0–99)
NonHDL: 100.33
Total CHOL/HDL Ratio: 3
Triglycerides: 100 mg/dL (ref 0.0–149.0)
VLDL: 20 mg/dL (ref 0.0–40.0)

## 2019-06-29 LAB — VITAMIN D 25 HYDROXY (VIT D DEFICIENCY, FRACTURES): VITD: 24.42 ng/mL — ABNORMAL LOW (ref 30.00–100.00)

## 2019-06-29 MED ORDER — ALPRAZOLAM 0.25 MG PO TABS: 0.2500 mg | ORAL_TABLET | Freq: Three times a day (TID) | ORAL | 2 refills | Status: DC | PRN

## 2019-06-29 MED ORDER — TRAMADOL HCL 50 MG PO TABS: 50.0000 mg | ORAL_TABLET | Freq: Three times a day (TID) | ORAL | 0 refills | Status: AC | PRN

## 2019-06-29 MED ORDER — HYDROCHLOROTHIAZIDE 25 MG PO TABS: 25.0000 mg | ORAL_TABLET | Freq: Every day | ORAL | 3 refills | Status: DC

## 2019-06-29 MED ORDER — ATORVASTATIN CALCIUM 10 MG PO TABS: 10.0000 mg | ORAL_TABLET | Freq: Every day | ORAL | 3 refills | Status: DC

## 2019-06-29 MED ORDER — AMLODIPINE BESYLATE 5 MG PO TABS: 5.0000 mg | ORAL_TABLET | Freq: Every day | ORAL | 3 refills | Status: DC

## 2019-06-29 MED ORDER — MEDROXYPROGESTERONE ACETATE 150 MG/ML IM SUSP: 150.0000 mg | Freq: Once | INTRAMUSCULAR | Status: DC

## 2019-06-29 MED ORDER — ATENOLOL 100 MG PO TABS: 100.0000 mg | ORAL_TABLET | Freq: Every day | ORAL | 3 refills | Status: DC

## 2019-06-29 MED ORDER — EPINEPHRINE 0.3 MG/0.3ML IJ SOAJ: 0.3000 mg | INTRAMUSCULAR | 1 refills | Status: DC | PRN

## 2019-06-29 MED ORDER — CLOBETASOL PROPIONATE 0.05 % EX OINT: TOPICAL_OINTMENT | CUTANEOUS | 5 refills | Status: DC

## 2019-06-29 MED ORDER — OMEPRAZOLE 40 MG PO CPDR: DELAYED_RELEASE_CAPSULE | ORAL | 3 refills | Status: DC

## 2019-06-29 MED ORDER — METHOCARBAMOL 500 MG PO TABS: 500.0000 mg | ORAL_TABLET | Freq: Four times a day (QID) | ORAL | 1 refills | Status: DC

## 2019-06-29 NOTE — Patient Instructions (Signed)
COVID-19 Vaccine Information can be found at: [email or web address] For questions related to vaccine distribution or appointments, please email [email or web address] or call [phone].       Preventive Care 40-52 Years Old, Female Preventive care refers to visits with your health care provider and lifestyle choices that can promote health and wellness. This includes:  A yearly physical exam. This may also be called an annual well check.  Regular dental visits and eye exams.  Immunizations.  Screening for certain conditions.  Healthy lifestyle choices, such as eating a healthy diet, getting regular exercise, not using drugs or products that contain nicotine and tobacco, and limiting alcohol use. What can I expect for my preventive care visit? Physical exam Your health care provider will check your:  Height and weight. This may be used to calculate body mass index (BMI), which tells if you are at a healthy weight.  Heart rate and blood pressure.  Skin for abnormal spots. Counseling Your health care provider may ask you questions about your:  Alcohol, tobacco, and drug use.  Emotional well-being.  Home and relationship well-being.  Sexual activity.  Eating habits.  Work and work environment.  Method of birth control.  Menstrual cycle.  Pregnancy history. What immunizations do I need?  Influenza (flu) vaccine  This is recommended every year. Tetanus, diphtheria, and pertussis (Tdap) vaccine  You may need a Td booster every 10 years. Varicella (chickenpox) vaccine  You may need this if you have not been vaccinated. Zoster (shingles) vaccine  You may need this after age 52. Measles, mumps, and rubella (MMR) vaccine  You may need at least one dose of MMR if you were born in 1957 or later. You may also need a second dose. Pneumococcal conjugate (PCV13) vaccine  You may need this if you have certain  conditions and were not previously vaccinated. Pneumococcal polysaccharide (PPSV23) vaccine  You may need one or two doses if you smoke cigarettes or if you have certain conditions. Meningococcal conjugate (MenACWY) vaccine  You may need this if you have certain conditions. Hepatitis A vaccine  You may need this if you have certain conditions or if you travel or work in places where you may be exposed to hepatitis A. Hepatitis B vaccine  You may need this if you have certain conditions or if you travel or work in places where you may be exposed to hepatitis B. Haemophilus influenzae type b (Hib) vaccine  You may need this if you have certain conditions. Human papillomavirus (HPV) vaccine  If recommended by your health care provider, you may need three doses over 6 months. You may receive vaccines as individual doses or as more than one vaccine together in one shot (combination vaccines). Talk with your health care provider about the risks and benefits of combination vaccines. What tests do I need? Blood tests  Lipid and cholesterol levels. These may be checked every 5 years, or more frequently if you are over 50 years old.  Hepatitis C test.  Hepatitis B test. Screening  Lung cancer screening. You may have this screening every year starting at age 52 if you have a 30-pack-year history of smoking and currently smoke or have quit within the past 15 years.  Colorectal cancer screening. All adults should have this screening starting at age 52 and continuing until age 75. Your health care provider may recommend screening at age 45 if you are at increased risk. You will have tests every 1-10 years, depending on your results and the type   type of screening test.  Diabetes screening. This is done by checking your blood sugar (glucose) after you have not eaten for a while (fasting). You may have this done every 1-3 years.  Mammogram. This may be done every 1-2 years. Talk with your health care  provider about when you should start having regular mammograms. This may depend on whether you have a family history of breast cancer.  BRCA-related cancer screening. This may be done if you have a family history of breast, ovarian, tubal, or peritoneal cancers.  Pelvic exam and Pap test. This may be done every 3 years starting at age 52. Starting at age 52, this may be done every 5 years if you have a Pap test in combination with an HPV test. Other tests  Sexually transmitted disease (STD) testing.  Bone density scan. This is done to screen for osteoporosis. You may have this scan if you are at high risk for osteoporosis. Follow these instructions at home: Eating and drinking  Eat a diet that includes fresh fruits and vegetables, whole grains, lean protein, and low-fat dairy.  Take vitamin and mineral supplements as recommended by your health care provider.  Do not drink alcohol if: ? Your health care provider tells you not to drink. ? You are pregnant, may be pregnant, or are planning to become pregnant.  If you drink alcohol: ? Limit how much you have to 0-1 drink a day. ? Be aware of how much alcohol is in your drink. In the [place] one drink equals one 12 oz bottle of beer (355 mL), one 5 oz glass of wine (148 mL), or one 1 oz glass of hard liquor (44 mL). Lifestyle  Take daily care of your teeth and gums.  Stay active. Exercise for at least 30 minutes on 5 or more days each week.  Do not use any products that contain nicotine or tobacco, such as cigarettes, e-cigarettes, and chewing tobacco. If you need help quitting, ask your health care provider.  If you are sexually active, practice safe sex. Use a condom or other form of birth control (contraception) in order to prevent pregnancy and STIs (sexually transmitted infections).  If told by your health care provider, take low-dose aspirin daily starting at age 52. What's next?  Visit your health care provider once a year for a  well check visit.  Ask your health care provider how often you should have your eyes and teeth checked.  Stay up to date on all vaccines. This information is not intended to replace advice given to you by your health care provider. Make sure you discuss any questions you have with your health care provider. Document Revised: 01/27/2018 Document Reviewed: 01/27/2018 Elsevier Patient Education  2020 [employer].

## 2019-06-29 NOTE — Progress Notes (Signed)
Subjective:     [name] Ramos is a 54 y.o. female and is here for a comprehensive physical exam. The patient reports mult problems and she needs new prescriptions.  .  Social History   Socioeconomic History  . Marital status: Single    Spouse name: Not on file  . Number of children: Not on file  . Years of education: Not on file  . Highest education level: Not on file  Occupational History  . Occupation: post Public affairs consultant: [employer]  Tobacco Use  . Smoking status: Former Smoker    Quit date: 06/16/2018    Years since quitting: 1.0  . Smokeless tobacco: Never Used  Substance and Sexual Activity  . Alcohol use: No    Alcohol/week: 0.0 standard drinks  . Drug use: No  . Sexual activity: Not Currently    Partners: Male  Other Topics Concern  . Not on file  Social History Narrative   Exercising---walking dog daily   Social Determinants of Health   Financial Resource Strain:   . Difficulty of Paying Living Expenses: Not on file  Food Insecurity:   . Worried About Programme researcher, broadcasting/film/video in the Last Year: Not on file  . Ran Out of Food in the Last Year: Not on file  Transportation Needs:   . Lack of Transportation (Medical): Not on file  . Lack of Transportation (Non-Medical): Not on file  Physical Activity:   . Days of Exercise per Week: Not on file  . Minutes of Exercise per Session: Not on file  Stress:   . Feeling of Stress : Not on file  Social Connections:   . Frequency of Communication with Friends and Family: Not on file  . Frequency of Social Gatherings with Friends and Family: Not on file  . Attends Religious Services: Not on file  . Active Member of Clubs or Organizations: Not on file  . Attends Banker Meetings: Not on file  . Marital Status: Not on file  Intimate Partner Violence:   . Fear of Current or Ex-Partner: Not on file  . Emotionally Abused: Not on file  . Physically Abused: Not on file  . Sexually Abused: Not on file    Health Maintenance  Topic Date Due  . INFLUENZA VACCINE  02/20/2020 (Originally 12/31/2018)  . HIV Screening  08/28/2035 (Originally 10/16/1980)  . MAMMOGRAM  01/03/2021  . PAP SMEAR-Modifier  07/18/2021  . TETANUS/TDAP  02/05/2027  . COLONOSCOPY  11/09/2028    The following portions of the patient's history were reviewed and updated as appropriate:  She  has a past medical history of Alcohol abuse, Allergic urticaria, Anxiety, Cholecystitis, acute, Congenital anomaly of neck, Depression, Dyslipidemia, Gastroparesis, GERD (gastroesophageal reflux disease), Hematuria, Hiatal hernia, Hypertension, Kidney stone, Obesity, PVC's (premature ventricular contractions), Thyroid nodule, Tinea corporis, Unspecified contraceptive management, and Urinary tract infection. She does not have any pertinent problems on file. She  has a past surgical history that includes Tonsillectomy and leep surgery. Her family history includes Breast cancer (age of onset: 51) in her mother; Colon polyps in her mother; Coronary artery disease in an other family member; Diabetes in her maternal grandmother and mother; Heart attack (age of onset: 49) in her maternal grandmother; Hyperlipidemia in her father; Hypertension in her father, mother, and another family member; Irritable bowel syndrome in her mother; Liver disease in her mother; Lung cancer in her maternal grandmother, maternal uncle, and paternal grandmother; Thyroid disease in her mother. She  reports that she quit smoking about a year ago. She has never used smokeless tobacco. She reports that she does not drink alcohol or use drugs. She has a current medication list which includes the following prescription(s): alprazolam, amlodipine, aspirin, atenolol, atorvastatin, clobetasol ointment, hydrochlorothiazide, methocarbamol, omeprazole, bupropion, epinephrine, medroxyprogesterone, medroxyprogesterone acetate, methocarbamol, and tramadol. Current Outpatient Medications on  File Prior to Visit  Medication Sig Dispense Refill  . aspirin 81 MG tablet Take 81 mg by mouth daily.    . methocarbamol (ROBAXIN) 500 MG tablet Take 1 tablet (500 mg total) by mouth every 6 (six) hours as needed for muscle spasms. 30 tablet 1  . buPROPion (WELLBUTRIN XL) 150 MG 24 hr tablet TAKE 1 TABLET BY MOUTH EVERY DAY (Patient not taking: Reported on 06/29/2019) 90 tablet 1  . medroxyPROGESTERone Acetate 150 MG/ML SUSY Inject 1 mL (150 mg total) as directed every 3 (three) months for 1 day. 1 Syringe 11   No current facility-administered medications on file prior to visit.   She is allergic to ace inhibitors; bee venom; lisinopril; and piroxicam..  Review of Systems Review of Systems  Constitutional: Negative for activity change, appetite change and fatigue.  HENT: Negative for hearing loss, congestion, tinnitus and ear discharge.  dentist q23m Eyes: Negative for visual disturbance (see optho q1y -- vision corrected to 20/20 with glasses).  Respiratory: Negative for cough, chest tightness and shortness of breath.   Cardiovascular: Negative for chest pain, palpitations and leg swelling.  Gastrointestinal: Negative for abdominal pain, diarrhea, constipation and abdominal distention.  Genitourinary: Negative for urgency, frequency, decreased urine volume and difficulty urinating.  Musculoskeletal: Negative for back pain, arthralgias and gait problem.  Skin: Negative for color change, pallor and rash.  Neurological: Negative for dizziness, light-headedness, numbness and headaches.  Hematological: Negative for adenopathy. Does not bruise/bleed easily.  Psychiatric/Behavioral: Negative for suicidal ideas, confusion, sleep disturbance, self-injury, dysphoric mood, decreased concentration and agitation.       Objective:    BP 126/78 (BP Location: Right Arm, Patient Position: Sitting, Cuff Size: Large)   Pulse 66   Temp (!) 97.3 F (36.3 C) (Temporal)   Resp 18   Ht 5\' 5"  (1.651 m)    Wt 223 lb 9.6 oz (101.4 kg)   SpO2 100%   BMI 37.21 kg/m  General appearance: alert, cooperative, appears stated age and no distress Head: Normocephalic, without obvious abnormality, atraumatic Eyes: conjunctivae/corneas clear. PERRL, EOM's intact. Fundi benign. Ears: normal TM's and external ear canals both ears Neck: no adenopathy, no carotid bruit, no JVD, supple, symmetrical, trachea midline and thyroid not enlarged, symmetric, no tenderness/mass/nodules Back: symmetric, no curvature. ROM normal. No CVA tenderness. Lungs: clear to auscultation bilaterally Breasts: normal appearance, no masses or tenderness Heart: regular rate and rhythm, S1, S2 normal, no murmur, click, rub or gallop Abdomen: soft, non-tender; bowel sounds normal; no masses,  no organomegaly Pelvic: deferred Extremities: extremities normal, atraumatic, no cyanosis or edema Pulses: 2+ and symmetric Skin: Skin color, texture, turgor normal. No rashes or lesions Lymph nodes: Cervical, supraclavicular, and axillary nodes normal. Neurologic: Alert and oriented X 3, normal strength and tone. Normal symmetric reflexes. Normal coordination and gait    Assessment:    Healthy female exam.      Plan:    ghm utd Check labs  See After Visit Summary for Counseling Recommendations    1. Generalized anxiety disorder Stable  - ALPRAZolam (XANAX) 0.25 MG tablet; Take 1 tablet (0.25 mg total) by mouth 3 (three) times daily  as needed for anxiety.  Dispense: 30 tablet; Refill: 2  2. Essential hypertension Well controlled, no changes to meds. Encouraged heart healthy diet such as the DASH diet and exercise as tolerated.   - atenolol (TENORMIN) 100 MG tablet; Take 1 tablet (100 mg total) by mouth daily.  Dispense: 90 tablet; Refill: 3 - hydrochlorothiazide (HYDRODIURIL) 25 MG tablet; Take 1 tablet (25 mg total) by mouth daily.  Dispense: 90 tablet; Refill: 3 - amLODipine (NORVASC) 5 MG tablet; Take 1 tablet (5 mg total) by  mouth daily.  Dispense: 90 tablet; Refill: 3 - Lipid panel - CBC with Differential/Platelet  3. Hyperlipidemia LDL goal <100 Tolerating statin, encouraged heart healthy diet, avoid trans fats, minimize simple carbs and saturated fats. Increase exercise as tolerated - atorvastatin (LIPITOR) 10 MG tablet; Take 1 tablet (10 mg total) by mouth daily.  Dispense: 90 tablet; Refill: 3 - Lipid panel - Comprehensive metabolic panel  4. Gastroesophageal reflux disease Refill meds  - omeprazole (PRILOSEC) 40 MG capsule; TAKE 1 CAPSULE BY MOUTH EVERY DAY  Dispense: 90 capsule; Refill: 3  5. Leukoplakia of vulva   - clobetasol ointment (TEMOVATE) 0.05 %; Apply to affected area every night for 4 weeks, then every other day for 4 weeks and then twice a week  Dispense: 60 g; Refill: 5  6. Anaphylaxis, initial encounter   - EPINEPHrine 0.3 mg/0.3 mL IJ SOAJ injection; Inject 0.3 mLs (0.3 mg total) into the muscle as needed for anaphylaxis.  Dispense: 2 each; Refill: 1  7. Chronic midline low back pain without sciatica Refill meds For neck and back pain  - methocarbamol (ROBAXIN) 500 MG tablet; Take 1 tablet (500 mg total) by mouth 4 (four) times daily.  Dispense: 45 tablet; Refill: 1 - traMADol (ULTRAM) 50 MG tablet; Take 1 tablet (50 mg total) by mouth every 8 (eight) hours as needed for up to 5 days.  Dispense: 15 tablet; Refill: 0  8. Encounter for initial prescription of injectable contraceptive Refill for now--  F/u gyn - medroxyPROGESTERone (DEPO-PROVERA) 150 MG/ML injection; Inject 1 mL (150 mg total) into the muscle once for 1 dose.  Dispense: 1 mL  9. Estrogen deficiency bmd and con't calcium and vita d  - DG Bone Density; Future - VITAMIN D 25 Hydroxy (Vit-D Deficiency, Fractures); Future - VITAMIN D 25 Hydroxy (Vit-D Deficiency, Fractures)  10. Preventative health care See above  - Lipid panel - CBC with Differential/Platelet - TSH - Comprehensive metabolic panel  11.  Ruptured disc, cervical Refill muscle relaxer and pain med s

## 2019-06-30 DIAGNOSIS — M546 Pain in thoracic spine: Secondary | ICD-10-CM | POA: Diagnosis not present

## 2019-06-30 DIAGNOSIS — M542 Cervicalgia: Secondary | ICD-10-CM | POA: Diagnosis not present

## 2019-06-30 DIAGNOSIS — M5412 Radiculopathy, cervical region: Secondary | ICD-10-CM | POA: Diagnosis not present

## 2019-07-03 ENCOUNTER — Encounter: Payer: Self-pay | Admitting: Family Medicine

## 2019-07-03 ENCOUNTER — Other Ambulatory Visit: Payer: Self-pay | Admitting: Family Medicine

## 2019-07-03 DIAGNOSIS — M5412 Radiculopathy, cervical region: Secondary | ICD-10-CM | POA: Diagnosis not present

## 2019-07-03 DIAGNOSIS — M546 Pain in thoracic spine: Secondary | ICD-10-CM | POA: Diagnosis not present

## 2019-07-03 MED ORDER — VITAMIN D (ERGOCALCIFEROL) 1.25 MG (50000 UNIT) PO CAPS: 50000.0000 [IU] | ORAL_CAPSULE | ORAL | 0 refills | Status: DC

## 2019-07-04 DIAGNOSIS — M546 Pain in thoracic spine: Secondary | ICD-10-CM | POA: Diagnosis not present

## 2019-07-04 DIAGNOSIS — M5412 Radiculopathy, cervical region: Secondary | ICD-10-CM | POA: Diagnosis not present

## 2019-07-04 NOTE — Telephone Encounter (Signed)
DUP

## 2019-07-17 DIAGNOSIS — M546 Pain in thoracic spine: Secondary | ICD-10-CM | POA: Diagnosis not present

## 2019-07-17 DIAGNOSIS — M5412 Radiculopathy, cervical region: Secondary | ICD-10-CM | POA: Diagnosis not present

## 2019-07-24 ENCOUNTER — Encounter: Payer: Self-pay | Admitting: Family Medicine

## 2019-07-25 DIAGNOSIS — M5412 Radiculopathy, cervical region: Secondary | ICD-10-CM | POA: Diagnosis not present

## 2019-07-25 DIAGNOSIS — M546 Pain in thoracic spine: Secondary | ICD-10-CM | POA: Diagnosis not present

## 2019-07-25 NOTE — Telephone Encounter (Signed)
SWP in regards to her billing complaint.  I let the pt know there were other diagnoses discussed outside of the realm of the CPE visit, thus generating the co-pay fee.

## 2019-07-26 ENCOUNTER — Other Ambulatory Visit: Payer: Self-pay

## 2019-07-26 ENCOUNTER — Ambulatory Visit (INDEPENDENT_AMBULATORY_CARE_PROVIDER_SITE_OTHER): Payer: Federal, State, Local not specified - PPO

## 2019-07-26 DIAGNOSIS — Z13820 Encounter for screening for osteoporosis: Secondary | ICD-10-CM | POA: Diagnosis not present

## 2019-07-26 DIAGNOSIS — E2839 Other primary ovarian failure: Secondary | ICD-10-CM

## 2019-07-27 ENCOUNTER — Other Ambulatory Visit: Payer: Self-pay | Admitting: Family Medicine

## 2019-07-27 DIAGNOSIS — Z3009 Encounter for other general counseling and advice on contraception: Secondary | ICD-10-CM

## 2019-08-01 DIAGNOSIS — M519 Unspecified thoracic, thoracolumbar and lumbosacral intervertebral disc disorder: Secondary | ICD-10-CM | POA: Diagnosis not present

## 2019-08-01 DIAGNOSIS — M542 Cervicalgia: Secondary | ICD-10-CM | POA: Diagnosis not present

## 2019-08-01 DIAGNOSIS — M546 Pain in thoracic spine: Secondary | ICD-10-CM | POA: Diagnosis not present

## 2019-08-02 DIAGNOSIS — D485 Neoplasm of uncertain behavior of skin: Secondary | ICD-10-CM | POA: Diagnosis not present

## 2019-08-07 ENCOUNTER — Encounter: Payer: Self-pay | Admitting: Obstetrics & Gynecology

## 2019-08-07 ENCOUNTER — Other Ambulatory Visit: Payer: Self-pay

## 2019-08-07 ENCOUNTER — Ambulatory Visit (INDEPENDENT_AMBULATORY_CARE_PROVIDER_SITE_OTHER): Payer: Federal, State, Local not specified - PPO | Admitting: Obstetrics & Gynecology

## 2019-08-07 VITALS — BP 120/60 | HR 72 | Ht 65.0 in | Wt 224.0 lb

## 2019-08-07 DIAGNOSIS — Z01419 Encounter for gynecological examination (general) (routine) without abnormal findings: Secondary | ICD-10-CM | POA: Diagnosis not present

## 2019-08-07 DIAGNOSIS — B373 Candidiasis of vulva and vagina: Secondary | ICD-10-CM

## 2019-08-07 DIAGNOSIS — R635 Abnormal weight gain: Secondary | ICD-10-CM

## 2019-08-07 DIAGNOSIS — N912 Amenorrhea, unspecified: Secondary | ICD-10-CM | POA: Diagnosis not present

## 2019-08-07 DIAGNOSIS — B3731 Acute candidiasis of vulva and vagina: Secondary | ICD-10-CM

## 2019-08-07 MED ORDER — TERCONAZOLE 0.4 % VA CREA: TOPICAL_CREAM | VAGINAL | 0 refills | Status: DC

## 2019-08-07 NOTE — Patient Instructions (Signed)
MyFitness app.    Calorie Counting for Weight Loss Calories are units of energy. Your body needs a certain amount of calories from food to keep you going throughout the day. When you eat more calories than your body needs, your body stores the extra calories as fat. When you eat fewer calories than your body needs, your body burns fat to get the energy it needs. Calorie counting means keeping track of how many calories you eat and drink each day. Calorie counting can be helpful if you need to lose weight. If you make sure to eat fewer calories than your body needs, you should lose weight. Ask your health care provider what a healthy weight is for you. For calorie counting to work, you will need to eat the right number of calories in a day in order to lose a healthy amount of weight per week. A dietitian can help you determine how many calories you need in a day and will give you suggestions on how to reach your calorie goal.  A healthy amount of weight to lose per week is usually 1-2 lb (0.5-0.9 kg). This usually means that your daily calorie intake should be reduced by 500-750 calories.  Eating 1,200 - 1,500 calories per day can help most women lose weight.  Eating 1,500 - 1,800 calories per day can help most men lose weight. What is my plan? My goal is to have __________ calories per day. If I have this many calories per day, I should lose around __________ pounds per week. What do I need to know about calorie counting? In order to meet your daily calorie goal, you will need to:  Find out how many calories are in each food you would like to eat. Try to do this before you eat.  Decide how much of the food you plan to eat.  Write down what you ate and how many calories it had. Doing this is called keeping a food log. To successfully lose weight, it is important to balance calorie counting with a healthy lifestyle that includes regular activity. Aim for 150 minutes of moderate exercise (such  as walking) or 75 minutes of vigorous exercise (such as running) each week. Where do I find calorie information?  The number of calories in a food can be found on a Nutrition Facts label. If a food does not have a Nutrition Facts label, try to look up the calories online or ask your dietitian for help. Remember that calories are listed per serving. If you choose to have more than one serving of a food, you will have to multiply the calories per serving by the amount of servings you plan to eat. For example, the label on a package of bread might say that a serving size is 1 slice and that there are 90 calories in a serving. If you eat 1 slice, you will have eaten 90 calories. If you eat 2 slices, you will have eaten 180 calories. How do I keep a food log? Immediately after each meal, record the following information in your food log:  What you ate. Don't forget to include toppings, sauces, and other extras on the food.  How much you ate. This can be measured in cups, ounces, or number of items.  How many calories each food and drink had.  The total number of calories in the meal. Keep your food log near you, such as in a small notebook in your pocket, or use a mobile app or  website. Some programs will calculate calories for you and show you how many calories you have left for the day to meet your goal. What are some calorie counting tips?   Use your calories on foods and drinks that will fill you up and not leave you hungry: ? Some examples of foods that fill you up are nuts and nut butters, vegetables, lean proteins, and high-fiber foods like whole grains. High-fiber foods are foods with more than 5 g fiber per serving. ? Drinks such as sodas, specialty coffee drinks, alcohol, and juices have a lot of calories, yet do not fill you up.  Eat nutritious foods and avoid empty calories. Empty calories are calories you get from foods or beverages that do not have many vitamins or protein, such as  candy, sweets, and soda. It is better to have a nutritious high-calorie food (such as an avocado) than a food with few nutrients (such as a bag of chips).  Know how many calories are in the foods you eat most often. This will help you calculate calorie counts faster.  Pay attention to calories in drinks. Low-calorie drinks include water and unsweetened drinks.  Pay attention to nutrition labels for "low fat" or "fat free" foods. These foods sometimes have the same amount of calories or more calories than the full fat versions. They also often have added sugar, starch, or salt, to make up for flavor that was removed with the fat.  Find a way of tracking calories that works for you. Get creative. Try different apps or programs if writing down calories does not work for you. What are some portion control tips?  Know how many calories are in a serving. This will help you know how many servings of a certain food you can have.  Use a measuring cup to measure serving sizes. You could also try weighing out portions on a kitchen scale. With time, you will be able to estimate serving sizes for some foods.  Take some time to put servings of different foods on your favorite plates, bowls, and cups so you know what a serving looks like.  Try not to eat straight from a bag or box. Doing this can lead to overeating. Put the amount you would like to eat in a cup or on a plate to make sure you are eating the right portion.  Use smaller plates, glasses, and bowls to prevent overeating.  Try not to multitask (for example, watch TV or use your computer) while eating. If it is time to eat, sit down at a table and enjoy your food. This will help you to know when you are full. It will also help you to be aware of what you are eating and how much you are eating. What are tips for following this plan? Reading food labels  Check the calorie count compared to the serving size. The serving size may be smaller than what  you are used to eating.  Check the source of the calories. Make sure the food you are eating is high in vitamins and protein and low in saturated and trans fats. Shopping  Read nutrition labels while you shop. This will help you make healthy decisions before you decide to purchase your food.  Make a grocery list and stick to it. Cooking  Try to cook your favorite foods in a healthier way. For example, try baking instead of frying.  Use low-fat dairy products. Meal planning  Use more fruits and vegetables. Half of  your plate should be fruits and vegetables.  Include lean proteins like poultry and fish. How do I count calories when eating out?  Ask for smaller portion sizes.  Consider sharing an entree and sides instead of getting your own entree.  If you get your own entree, eat only half. Ask for a box at the beginning of your meal and put the rest of your entree in it so you are not tempted to eat it.  If calories are listed on the menu, choose the lower calorie options.  Choose dishes that include vegetables, fruits, whole grains, low-fat dairy products, and lean protein.  Choose items that are boiled, broiled, grilled, or steamed. Stay away from items that are buttered, battered, fried, or served with cream sauce. Items labeled "crispy" are usually fried, unless stated otherwise.  Choose water, low-fat milk, unsweetened iced tea, or other drinks without added sugar. If you want an alcoholic beverage, choose a lower calorie option such as a glass of wine or light beer.  Ask for dressings, sauces, and syrups on the side. These are usually high in calories, so you should limit the amount you eat.  If you want a salad, choose a garden salad and ask for grilled meats. Avoid extra toppings like bacon, cheese, or fried items. Ask for the dressing on the side, or ask for olive oil and vinegar or lemon to use as dressing.  Estimate how many servings of a food you are given. For  example, a serving of cooked rice is  cup or about the size of half a baseball. Knowing serving sizes will help you be aware of how much food you are eating at restaurants. The list below tells you how big or small some common portion sizes are based on everyday objects: ? 1 oz--4 stacked dice. ? 3 oz--1 deck of cards. ? 1 tsp--1 die. ? 1 Tbsp-- a ping-pong ball. ? 2 Tbsp--1 ping-pong ball. ?  cup-- baseball. ? 1 cup--1 baseball. Summary  Calorie counting means keeping track of how many calories you eat and drink each day. If you eat fewer calories than your body needs, you should lose weight.  A healthy amount of weight to lose per week is usually 1-2 lb (0.5-0.9 kg). This usually means reducing your daily calorie intake by 500-750 calories.  The number of calories in a food can be found on a Nutrition Facts label. If a food does not have a Nutrition Facts label, try to look up the calories online or ask your dietitian for help.  Use your calories on foods and drinks that will fill you up, and not on foods and drinks that will leave you hungry.  Use smaller plates, glasses, and bowls to prevent overeating. This information is not intended to replace advice given to you by your health care provider. Make sure you discuss any questions you have with your health care provider. Document Revised: 02/04/2018 Document Reviewed: 04/17/2016 Elsevier Patient Education  2020 [employer].

## 2019-08-07 NOTE — Progress Notes (Signed)
Subjective:     [name] Ramos is a 54 y.o. female here for a routine exam.  Current complaints: continued amenorrhea on Depo provera. Pt wants to consider stopping Depo Provera.  She does not want to resume menses and is wondering if she needs a endometrial ablation.  She reports some vulvar itching that is new. She has lichen sclerosus but, this new. The itching tends to respond to hydrocortisone but, not the clobetasol.   Pt last smoked in Jan 2020!!!!!    Gynecologic History No LMP recorded. Patient has had an injection. Contraception: abstinence Last Pap: 2020. Results were: normal Last mammogram: 01/04/2019. Results were: normal  Obstetric History OB History  Gravida Para Term Preterm AB Living  3       3    SAB TAB Ectopic Multiple Live Births    3     0    # Outcome Date GA Lbr Len/2nd Weight Sex Delivery Anes PTL Lv  3 TAB           2 TAB           1 TAB              The following portions of the patient's history were reviewed and updated as appropriate: allergies, current medications, past family history, past medical history, past social history, past surgical history and problem list.  Review of Systems Pertinent items are noted in HPI.    Objective:  BP 120/60   Pulse 72   Ht 5\' 5"  (1.651 m)   Wt 224 lb (101.6 kg)   BMI 37.28 kg/m  General Appearance:    Alert, cooperative, no distress, appears stated age  Head:    Normocephalic, without obvious abnormality, atraumatic  Eyes:    conjunctiva/corneas clear, EOM's intact, both eyes  Ears:    Normal external ear canals, both ears  Nose:   Nares normal, septum midline, mucosa normal, no drainage    or sinus tenderness  Throat:   Lips, mucosa, and tongue normal; teeth and gums normal  Neck:   Supple, symmetrical, trachea midline, no adenopathy;    thyroid:  no enlargement/tenderness/nodules  Back:     Symmetric, no curvature, ROM normal, no CVA tenderness  Lungs:     respirations unlabored  Chest Wall:    No  tenderness or deformity   Heart:    Regular rate and rhythm  Breast Exam:    No tenderness, masses, or nipple abnormality  Abdomen:     Soft, non-tender, bowel sounds active all four quadrants,    no masses, no organomegaly  Genitalia:    Normal female without lesion, discharge or tenderness   There is atrophy at the introitus and some decrease in lubrication.  The hypopigmented areas are present but, no excoriations. There are areas of erythema consistent with yeast.     Extremities:   Extremities normal, atraumatic, no cyanosis or edema  Pulses:   2+ and symmetric all extremities  Skin:   Skin color, texture, turgor normal, no rashes or lesions     Assessment:    Healthy female exam.   Genitourinary syndrome of menopause.  Yeast vulvovaginitis  Lichen sclerosus   Plan:   [name] Ramos- if elevated, pt wants to consider trial off Depo Provera. If she starts to have menses again, pt requests an endometrial ablation  Terazol 7 bid for yeast.   Keep clobetasol. Rec stopping hydrocortisone.   Tob cessation- pt has been tobacco free since Jan 2020!!!!  Encouraged on her accomplishment   Weight gain after tob cessation- reviewed MyFitnessPal app. Strength training  F/u in 1 year for annual   F/u sooner prn   [name] Ramos L. Harraway-Smith, M.D., [facility]

## 2019-08-07 NOTE — Progress Notes (Signed)
Discuss coming off depo and talk about an ablation    Having external vaginal irritation  Would like a pap today as well Mammo due in August

## 2019-08-08 LAB — FOLLICLE STIMULATING HORMONE: FSH: 56.6 m[IU]/mL

## 2019-10-03 NOTE — Progress Notes (Signed)
Cardiology Office Note:    Date:  10/04/2019   ID:  [name] Ramos, DOB 1965/12/11, MRN [id number]  PCP:  Ann Held, DO  Cardiologist:  Sinclair Grooms, MD   Referring MD: Carollee Herter, Alferd Apa, *   Chief Complaint  Patient presents with  . Coronary Artery Disease  . Hypertension    History of Present Illness:    [name] Ramos is a 54 y.o. female with a hx of premature ventricular contractions, left bundle branch block, family history of coronary artery disease, and atypical chest pain  She denies palpitations and chest pain.  She feels okay.  She denies angina.  Medications are being taken as prescribed.  She notices spider veins of both lower extremities right greater than left.  Left lower extremity can develop ankle edema.  She stopped smoking in January 2020 but has gained some weight.  Past Medical History:  Diagnosis Date  . Alcohol abuse    recovering alcoholic  . Allergic urticaria   . Anxiety   . Cholecystitis, acute   . Congenital anomaly of neck   . Depression   . Dyslipidemia    Followed by PCP  . Gastroparesis   . GERD (gastroesophageal reflux disease)   . Hematuria   . Hiatal hernia    with reflux  . Hypertension    stress test 11/2010 normal  . Kidney stone   . Obesity   . PVC's (premature ventricular contractions)    Controlled on beta blocker therapy;  Echo 9/10: EF 78-46%, grade 1 diastolic dysfunction, mild MR, mild LAE  . Thyroid nodule    hx  . Tinea corporis   . Unspecified contraceptive management   . Urinary tract infection     Past Surgical History:  Procedure Laterality Date  . leep surgery     mid 20's  . TONSILLECTOMY      Current Medications: Current Meds  Medication Sig  . ALPRAZolam (XANAX) 0.25 MG tablet Take 1 tablet (0.25 mg total) by mouth 3 (three) times daily as needed for anxiety.  Marland Kitchen amLODipine (NORVASC) 5 MG tablet Take 1 tablet (5 mg total) by mouth daily.  Marland Kitchen aspirin 81 MG tablet Take 81 mg by  mouth daily.  Marland Kitchen atenolol (TENORMIN) 100 MG tablet Take 1 tablet (100 mg total) by mouth daily.  Marland Kitchen atorvastatin (LIPITOR) 10 MG tablet Take 1 tablet (10 mg total) by mouth daily.  . clobetasol ointment (TEMOVATE) 0.05 % Apply to affected area every night for 4 weeks, then every other day for 4 weeks and then twice a week  . EPINEPHrine 0.3 mg/0.3 mL IJ SOAJ injection Inject 0.3 mLs (0.3 mg total) into the muscle as needed for anaphylaxis.  . Ergocalciferol (VITAMIN D2 PO) Take 1,000 mg by mouth daily.  . hydrochlorothiazide (HYDRODIURIL) 25 MG tablet Take 1 tablet (25 mg total) by mouth daily.  . medroxyPROGESTERone Acetate 150 MG/ML SUSY INJECT 1 ML (150 MG TOTAL) AS DIRECTED EVERY 3 (THREE) MONTHS FOR 1 DAY.  . methocarbamol (ROBAXIN) 500 MG tablet Take 1 tablet (500 mg total) by mouth every 6 (six) hours as needed for muscle spasms.  Marland Kitchen omeprazole (PRILOSEC) 40 MG capsule TAKE 1 CAPSULE BY MOUTH EVERY DAY     Allergies:   Ace inhibitors, Bee venom, Lisinopril, and Piroxicam   Social History   Socioeconomic History  . Marital status: Single    Spouse name: Not on file  . Number of children: Not on file  .  Years of education: Not on file  . Highest education level: Not on file  Occupational History  . Occupation: post Public affairs consultant: [employer]  Tobacco Use  . Smoking status: Former Smoker    Quit date: 06/16/2018    Years since quitting: 1.3  . Smokeless tobacco: Never Used  Substance and Sexual Activity  . Alcohol use: No    Alcohol/week: 0.0 standard drinks  . Drug use: No  . Sexual activity: Not Currently    Partners: Male  Other Topics Concern  . Not on file  Social History Narrative   Exercising---walking dog daily   Social Determinants of Health   Financial Resource Strain:   . Difficulty of Paying Living Expenses:   Food Insecurity:   . Worried About Programme researcher, broadcasting/film/video in the Last Year:   . Barista in the Last Year:   Transportation Needs:   .  Freight forwarder (Medical):   Marland Kitchen Lack of Transportation (Non-Medical):   Physical Activity:   . Days of Exercise per Week:   . Minutes of Exercise per Session:   Stress:   . Feeling of Stress :   Social Connections:   . Frequency of Communication with Friends and Family:   . Frequency of Social Gatherings with Friends and Family:   . Attends Religious Services:   . Active Member of Clubs or Organizations:   . Attends Banker Meetings:   Marland Kitchen Marital Status:      Family History: The patient's family history includes Breast cancer (age of onset: 33) in her mother; Colon polyps in her mother; Coronary artery disease in an other family member; Diabetes in her maternal grandmother and mother; Heart attack (age of onset: 27) in her maternal grandmother; Hyperlipidemia in her father; Hypertension in her father, mother, and another family member; Irritable bowel syndrome in her mother; Liver disease in her mother; Lung cancer in her maternal grandmother, maternal uncle, and paternal grandmother; Thyroid disease in her mother. There is no history of Colon cancer or Stomach cancer.  ROS:   Please see the history of present illness.    Occasional left ankle swelling all other systems reviewed and are negative.  EKGs/Labs/Other Studies Reviewed:    The following studies were reviewed today: No new data  EKG:  EKG EKG demonstrates sinus bradycardia, early repolarization, poor R wave progression.  When compared to the prior tracing from January 2020, no changes occurred.  Recent Labs: 06/29/2019: ALT 29; BUN 9; Creatinine, Ser 0.79; Hemoglobin 13.9; Platelets 296.0; Potassium 3.7; Sodium 137; TSH 0.62  Recent Lipid Panel    Component Value Date/Time   CHOL 147 06/29/2019 1026   TRIG 100.0 06/29/2019 1026   HDL 46.20 06/29/2019 1026   CHOLHDL 3 06/29/2019 1026   VLDL 20.0 06/29/2019 1026   LDLCALC 80 06/29/2019 1026   LDLDIRECT 148.1 01/20/2008 0912    Physical Exam:     VS:  BP 136/82   Pulse (!) 56   Ht 5\' 5"  (1.651 m)   Wt 219 lb 6.4 oz (99.5 kg)   SpO2 97%   BMI 36.51 kg/m     Wt Readings from Last 3 Encounters:  10/04/19 219 lb 6.4 oz (99.5 kg)  08/07/19 224 lb (101.6 kg)  06/29/19 223 lb 9.6 oz (101.4 kg)     GEN: Obese.. No acute distress HEENT: Normal NECK: No JVD. LYMPHATICS: No lymphadenopathy CARDIAC:  RRR without murmur, gallop, or edema is  noted today.  She does have spider veins on the right calf and less so on the left calf.Marland Kitchen VASCULAR:  Normal Pulses. No bruits. RESPIRATORY:  Clear to auscultation without rales, wheezing or rhonchi  ABDOMEN: Soft, non-tender, non-distended, No pulsatile mass, MUSCULOSKELETAL: No deformity  SKIN: Warm and dry NEUROLOGIC:  Alert and oriented x 3 PSYCHIATRIC:  Normal affect   ASSESSMENT:    1. Essential hypertension   2. Left bundle branch block   3. Hyperlipidemia with target LDL less than 100   4. Tobacco use   5. Educated about COVID-19 virus infection    PLAN:    In order of problems listed above:  1. Blood pressure is controlled.  Low-salt diet, weight loss, and moderate activity 150 minutes/week or more. 2. Left bundle branch block continues to be resolved. 3. LDL cholesterol was 80.  Target is 70.  She is doing well.  Continue dieting and exercise. 4. Continue abstention from cigarette smoking. 5. COVID-19 vaccine is been received.   Medication Adjustments/Labs and Tests Ordered: Current medicines are reviewed at length with the patient today.  Concerns regarding medicines are outlined above.  Orders Placed This Encounter  Procedures  . EKG 12-Lead   No orders of the defined types were placed in this encounter.   Patient Instructions  Medication Instructions:  Your physician recommends that you continue on your current medications as directed. Please refer to the Current Medication list given to you today.  *If you need a refill on your cardiac medications before your  next appointment, please call your pharmacy*   Lab Work: None If you have labs (blood work) drawn today and your tests are completely normal, you will receive your results only by: Marland Kitchen MyChart Message (if you have MyChart) OR . A paper copy in the mail If you have any lab test that is abnormal or we need to change your treatment, we will call you to review the results.   Testing/Procedures: None   Follow-Up: At [facility], you and your health needs are our priority.  As part of our continuing mission to provide you with exceptional heart care, we have created designated Provider Care Teams.  These Care Teams include your primary Cardiologist (physician) and Advanced Practice Providers (APPs -  Physician Assistants and Nurse Practitioners) who all work together to provide you with the care you need, when you need it.  We recommend signing up for the patient portal called "MyChart".  Sign up information is provided on this After Visit Summary.  MyChart is used to connect with patients for Virtual Visits (Telemedicine).  Patients are able to view lab/test results, encounter notes, upcoming appointments, etc.  Non-urgent messages can be sent to your provider as well.   To learn more about what you can do with MyChart, go to [email or web address].    Your next appointment:   12 month(s)  The format for your next appointment:   In Person  Provider:   You may see Lesleigh Noe, MD or one of the following Advanced Practice Providers on your designated Care Team:    Norma Fredrickson, NP  Nada Boozer, NP  Georgie Chard, NP    Other Instructions  Your provider recommends that you maintain 150-300 minutes per week of moderate aerobic activity.  t    Signed, Lesleigh Noe, MD  10/04/2019 10:23 AM     [facility]

## 2019-10-04 ENCOUNTER — Other Ambulatory Visit: Payer: Self-pay

## 2019-10-04 ENCOUNTER — Encounter: Payer: Self-pay | Admitting: Interventional Cardiology

## 2019-10-04 ENCOUNTER — Ambulatory Visit: Payer: Federal, State, Local not specified - PPO | Admitting: Interventional Cardiology

## 2019-10-04 VITALS — BP 136/82 | HR 56 | Ht 65.0 in | Wt 219.4 lb

## 2019-10-04 DIAGNOSIS — Z7189 Other specified counseling: Secondary | ICD-10-CM

## 2019-10-04 DIAGNOSIS — Z720 Tobacco use: Secondary | ICD-10-CM | POA: Diagnosis not present

## 2019-10-04 DIAGNOSIS — E785 Hyperlipidemia, unspecified: Secondary | ICD-10-CM

## 2019-10-04 DIAGNOSIS — I447 Left bundle-branch block, unspecified: Secondary | ICD-10-CM

## 2019-10-04 DIAGNOSIS — I10 Essential (primary) hypertension: Secondary | ICD-10-CM

## 2019-10-04 NOTE — Patient Instructions (Signed)
Medication Instructions:  Your physician recommends that you continue on your current medications as directed. Please refer to the Current Medication list given to you today.  *If you need a refill on your cardiac medications before your next appointment, please call your pharmacy*   Lab Work: None If you have labs (blood work) drawn today and your tests are completely normal, you will receive your results only by: Marland Kitchen MyChart Message (if you have MyChart) OR . A paper copy in the mail If you have any lab test that is abnormal or we need to change your treatment, we will call you to review the results.   Testing/Procedures: None   Follow-Up: At [facility], you and your health needs are our priority.  As part of our continuing mission to provide you with exceptional heart care, we have created designated Provider Care Teams.  These Care Teams include your primary Cardiologist (physician) and Advanced Practice Providers (APPs -  Physician Assistants and Nurse Practitioners) who all work together to provide you with the care you need, when you need it.  We recommend signing up for the patient portal called "MyChart".  Sign up information is provided on this After Visit Summary.  MyChart is used to connect with patients for Virtual Visits (Telemedicine).  Patients are able to view lab/test results, encounter notes, upcoming appointments, etc.  Non-urgent messages can be sent to your provider as well.   To learn more about what you can do with MyChart, go to [email or web address].    Your next appointment:   12 month(s)  The format for your next appointment:   In Person  Provider:   You may see Lesleigh Noe, MD or one of the following Advanced Practice Providers on your designated Care Team:    Norma Fredrickson, NP  Nada Boozer, NP  Georgie Chard, NP    Other Instructions  Your provider recommends that you maintain 150-300 minutes per week of moderate aerobic  activity.  t

## 2019-12-28 ENCOUNTER — Ambulatory Visit: Payer: Federal, State, Local not specified - PPO | Admitting: Family Medicine

## 2019-12-28 ENCOUNTER — Encounter: Payer: Self-pay | Admitting: Family Medicine

## 2019-12-28 ENCOUNTER — Other Ambulatory Visit: Payer: Self-pay

## 2019-12-28 ENCOUNTER — Other Ambulatory Visit: Payer: Self-pay | Admitting: Family Medicine

## 2019-12-28 VITALS — BP 126/80 | HR 86 | Resp 16 | Ht 65.0 in | Wt 215.0 lb

## 2019-12-28 DIAGNOSIS — Z1231 Encounter for screening mammogram for malignant neoplasm of breast: Secondary | ICD-10-CM

## 2019-12-28 DIAGNOSIS — F418 Other specified anxiety disorders: Secondary | ICD-10-CM

## 2019-12-28 DIAGNOSIS — E785 Hyperlipidemia, unspecified: Secondary | ICD-10-CM | POA: Diagnosis not present

## 2019-12-28 DIAGNOSIS — E559 Vitamin D deficiency, unspecified: Secondary | ICD-10-CM | POA: Diagnosis not present

## 2019-12-28 DIAGNOSIS — E538 Deficiency of other specified B group vitamins: Secondary | ICD-10-CM | POA: Diagnosis not present

## 2019-12-28 DIAGNOSIS — I10 Essential (primary) hypertension: Secondary | ICD-10-CM | POA: Diagnosis not present

## 2019-12-28 LAB — COMPREHENSIVE METABOLIC PANEL
ALT: 26 U/L (ref 0–35)
AST: 20 U/L (ref 0–37)
Albumin: 4.1 g/dL (ref 3.5–5.2)
Alkaline Phosphatase: 59 U/L (ref 39–117)
BUN: 11 mg/dL (ref 6–23)
CO2: 27 mEq/L (ref 19–32)
Calcium: 9.6 mg/dL (ref 8.4–10.5)
Chloride: 103 mEq/L (ref 96–112)
Creatinine, Ser: 0.84 mg/dL (ref 0.40–1.20)
GFR: 71.14 mL/min (ref >60.00)
Glucose, Bld: 139 mg/dL — ABNORMAL HIGH (ref 70–99)
Potassium: 3.9 mEq/L (ref 3.5–5.1)
Sodium: 137 mEq/L (ref 135–145)
Total Bilirubin: 0.6 mg/dL (ref 0.2–1.2)
Total Protein: 7 g/dL (ref 6.0–8.3)

## 2019-12-28 LAB — LIPID PANEL
Cholesterol: 134 mg/dL (ref 0–200)
HDL: 46.7 mg/dL (ref >39.00)
LDL Cholesterol: 69 mg/dL (ref 0–99)
NonHDL: 86.94
Total CHOL/HDL Ratio: 3
Triglycerides: 88 mg/dL (ref 0.0–149.0)
VLDL: 17.6 mg/dL (ref 0.0–40.0)

## 2019-12-28 LAB — VITAMIN D 25 HYDROXY (VIT D DEFICIENCY, FRACTURES): VITD: 58.22 ng/mL (ref 30.00–100.00)

## 2019-12-28 LAB — B12 AND FOLATE PANEL
Folate: >24.8 ng/mL (ref >5.9)
Vitamin B-12: 731 pg/mL (ref 211–911)

## 2019-12-28 NOTE — Assessment & Plan Note (Signed)
Tolerating statin, encouraged heart healthy diet, avoid trans fats, minimize simple carbs and saturated fats. Increase exercise as tolerated 

## 2019-12-28 NOTE — Assessment & Plan Note (Signed)
Well controlled, no changes to meds. Encouraged heart healthy diet such as the DASH diet and exercise as tolerated.  °

## 2019-12-28 NOTE — Assessment & Plan Note (Signed)
Stable Only taking prn xanax

## 2019-12-28 NOTE — Progress Notes (Signed)
Patient ID: [name] Ramos, female    DOB: 09-26-1965  Age: 54 y.o. MRN: [id number]    Subjective:  Subjective  HPI [name] Ramos presents for f/u bp and cholesterol.   Pt is struggling with her weight and would like some help with weight loss.     Review of Systems  Constitutional: Negative for appetite change, diaphoresis, fatigue and unexpected weight change.  Eyes: Negative for pain, redness and visual disturbance.  Respiratory: Negative for cough, chest tightness, shortness of breath and wheezing.   Cardiovascular: Negative for chest pain, palpitations and leg swelling.  Endocrine: Negative for cold intolerance, heat intolerance, polydipsia, polyphagia and polyuria.  Genitourinary: Negative for difficulty urinating, dysuria and frequency.  Neurological: Negative for dizziness, light-headedness, numbness and headaches.    History Past Medical History:  Diagnosis Date  . Alcohol abuse    recovering alcoholic  . Allergic urticaria   . Anxiety   . Cholecystitis, acute   . Congenital anomaly of neck   . Depression   . Dyslipidemia    Followed by PCP  . Gastroparesis   . GERD (gastroesophageal reflux disease)   . Hematuria   . Hiatal hernia    with reflux  . Hypertension    stress test 11/2010 normal  . Kidney stone   . Obesity   . PVC's (premature ventricular contractions)    Controlled on beta blocker therapy;  Echo 9/10: EF 55-60%, grade 1 diastolic dysfunction, mild MR, mild LAE  . Thyroid nodule    hx  . Tinea corporis   . Unspecified contraceptive management   . Urinary tract infection     She has a past surgical history that includes Tonsillectomy and leep surgery.   Her family history includes Breast cancer (age of onset: 13) in her mother; Colon polyps in her mother; Coronary artery disease in an other family member; Diabetes in her maternal grandmother and mother; Heart attack (age of onset: 70) in her maternal grandmother; Hyperlipidemia in her father;  Hypertension in her father, mother, and another family member; Irritable bowel syndrome in her mother; Liver disease in her mother; Lung cancer in her maternal grandmother, maternal uncle, and paternal grandmother; Thyroid disease in her mother.She reports that she quit smoking about 18 months ago. She has never used smokeless tobacco. She reports that she does not drink alcohol and does not use drugs.  Current Outpatient Medications on File Prior to Visit  Medication Sig Dispense Refill  . ALPRAZolam (XANAX) 0.25 MG tablet Take 1 tablet (0.25 mg total) by mouth 3 (three) times daily as needed for anxiety. 30 tablet 2  . amLODipine (NORVASC) 5 MG tablet Take 1 tablet (5 mg total) by mouth daily. 90 tablet 3  . aspirin 81 MG tablet Take 81 mg by mouth daily.    Marland Kitchen atenolol (TENORMIN) 100 MG tablet Take 1 tablet (100 mg total) by mouth daily. 90 tablet 3  . atorvastatin (LIPITOR) 10 MG tablet Take 1 tablet (10 mg total) by mouth daily. 90 tablet 3  . clobetasol ointment (TEMOVATE) 0.05 % Apply to affected area every night for 4 weeks, then every other day for 4 weeks and then twice a week 60 g 5  . EPINEPHrine 0.3 mg/0.3 mL IJ SOAJ injection Inject 0.3 mLs (0.3 mg total) into the muscle as needed for anaphylaxis. 2 each 1  . Ergocalciferol (VITAMIN D2 PO) Take 1,000 mg by mouth daily.    . hydrochlorothiazide (HYDRODIURIL) 25 MG tablet Take 1 tablet (25 mg total)  by mouth daily. 90 tablet 3  . methocarbamol (ROBAXIN) 500 MG tablet Take 1 tablet (500 mg total) by mouth every 6 (six) hours as needed for muscle spasms. 30 tablet 1  . omeprazole (PRILOSEC) 40 MG capsule TAKE 1 CAPSULE BY MOUTH EVERY DAY 90 capsule 3  . medroxyPROGESTERone Acetate 150 MG/ML SUSY INJECT 1 ML (150 MG TOTAL) AS DIRECTED EVERY 3 (THREE) MONTHS FOR 1 DAY. 1 mL 2   No current facility-administered medications on file prior to visit.     Objective:  Objective  Physical Exam Vitals and nursing note reviewed.  Constitutional:       Appearance: She is well-developed.  HENT:     Head: Normocephalic and atraumatic.  Eyes:     Conjunctiva/sclera: Conjunctivae normal.  Neck:     Thyroid: No thyromegaly.     Vascular: No carotid bruit or JVD.  Cardiovascular:     Rate and Rhythm: Normal rate and regular rhythm.     Heart sounds: Normal heart sounds. No murmur heard.   Pulmonary:     Effort: Pulmonary effort is normal. No respiratory distress.     Breath sounds: Normal breath sounds. No wheezing or rales.  Chest:     Chest wall: No tenderness.  Musculoskeletal:     Cervical back: Normal range of motion and neck supple.  Neurological:     Mental Status: She is alert and oriented to person, place, and time.    BP 126/80 (BP Location: Right Arm, Patient Position: Sitting, Cuff Size: Large)   Pulse 86   Resp 16   Ht 5\' 5"  (1.651 m)   Wt (!) 215 lb (97.5 kg)   SpO2 99%   BMI 35.78 kg/m  Wt Readings from Last 3 Encounters:  12/28/19 (!) 215 lb (97.5 kg)  10/04/19 219 lb 6.4 oz (99.5 kg)  08/07/19 224 lb (101.6 kg)     Lab Results  Component Value Date   WBC 8.1 06/29/2019   HGB 13.9 06/29/2019   HCT 41.6 06/29/2019   PLT 296.0 06/29/2019   GLUCOSE 118 (H) 06/29/2019   CHOL 147 06/29/2019   TRIG 100.0 06/29/2019   HDL 46.20 06/29/2019   LDLDIRECT 148.1 01/20/2008   LDLCALC 80 06/29/2019   ALT 29 06/29/2019   AST 19 06/29/2019   NA 137 06/29/2019   K 3.7 06/29/2019   CL 104 06/29/2019   CREATININE 0.79 06/29/2019   BUN 9 06/29/2019   CO2 26 06/29/2019   TSH 0.62 06/29/2019    DG SCOLIOSIS EVAL COMPLETE SPINE 2 OR 3 VIEWS  Result Date: 01/30/2019 CLINICAL DATA:  Thoracic spine history of scoliosis EXAM: DG SCOLIOSIS EVAL COMPLETE SPINE 2-3V COMPARISON:  None. FINDINGS: There is a mild levoconvex scoliotic curvature of the thoracolumbar spine centered at L1 measuring 7 degrees. The vertebral body heights appear to be well maintained. No acute osseous abnormality is noted. IMPRESSION: Mild  levoconvex scoliotic curvature of the thoracolumbar spine. Electronically Signed   By: 02/01/2019 M.D.   On: 01/30/2019 11:41     Assessment & Plan:  Plan  I am having 02/01/2019 maintain her aspirin, methocarbamol, ALPRAZolam, atenolol, atorvastatin, hydrochlorothiazide, amLODipine, omeprazole, clobetasol ointment, EPINEPHrine, medroxyPROGESTERone Acetate, and Ergocalciferol (VITAMIN D2 PO).  No orders of the defined types were placed in this encounter.   Problem List Items Addressed This Visit      Unprioritized   Depression with anxiety    Stable Only taking prn xanax      Essential  hypertension    Well controlled, no changes to meds. Encouraged heart healthy diet such as the DASH diet and exercise as tolerated.       Hyperlipidemia LDL goal <100    Tolerating statin, encouraged heart healthy diet, avoid trans fats, minimize simple carbs and saturated fats. Increase exercise as tolerated       Other Visit Diagnoses    Dyslipidemia    -  Primary   Relevant Orders   Lipid panel   Comprehensive metabolic panel   Vitamin D deficiency       Relevant Orders   Vitamin D (25 hydroxy)   Morbid obesity (HCC)       Relevant Orders   Amb Ref to Medical Weight Management   B12 deficiency       Relevant Orders   B12 and Folate Panel      Follow-up: Return in about 6 months (around 06/29/2020), or if symptoms worsen or fail to improve, for hypertension, hyperlipidemia.  Donato Schultz, DO

## 2019-12-28 NOTE — Patient Instructions (Signed)
DASH Eating Plan DASH stands for "Dietary Approaches to Stop Hypertension." The DASH eating plan is a healthy eating plan that has been shown to reduce high blood pressure (hypertension). It may also reduce your risk for type 2 diabetes, heart disease, and stroke. The DASH eating plan may also help with weight loss. What are tips for following this plan?  General guidelines  Avoid eating more than 2,300 mg (milligrams) of salt (sodium) a day. If you have hypertension, you may need to reduce your sodium intake to 1,500 mg a day.  Limit alcohol intake to no more than 1 drink a day for nonpregnant women and 2 drinks a day for men. One drink equals 12 oz of beer, 5 oz of wine, or 1 oz of hard liquor.  Work with your health care provider to maintain a healthy body weight or to lose weight. Ask what an ideal weight is for you.  Get at least 30 minutes of exercise that causes your heart to beat faster (aerobic exercise) most days of the week. Activities may include walking, swimming, or biking.  Work with your health care provider or diet and nutrition specialist (dietitian) to adjust your eating plan to your individual calorie needs. Reading food labels   Check food labels for the amount of sodium per serving. Choose foods with less than 5 percent of the Daily Value of sodium. Generally, foods with less than 300 mg of sodium per serving fit into this eating plan.  To find whole grains, look for the word "whole" as the first word in the ingredient list. Shopping  Buy products labeled as "low-sodium" or "no salt added."  Buy fresh foods. Avoid canned foods and premade or frozen meals. Cooking  Avoid adding salt when cooking. Use salt-free seasonings or herbs instead of table salt or sea salt. Check with your health care provider or pharmacist before using salt substitutes.  Do not fry foods. Cook foods using healthy methods such as baking, boiling, grilling, and broiling instead.  Cook with  heart-healthy oils, such as olive, canola, soybean, or sunflower oil. Meal planning  Eat a balanced diet that includes: ? 5 or more servings of fruits and vegetables each day. At each meal, try to fill half of your plate with fruits and vegetables. ? Up to 6-8 servings of whole grains each day. ? Less than 6 oz of lean meat, poultry, or fish each day. A 3-oz serving of meat is about the same size as a deck of cards. One egg equals 1 oz. ? 2 servings of low-fat dairy each day. ? A serving of nuts, seeds, or beans 5 times each week. ? Heart-healthy fats. Healthy fats called Omega-3 fatty acids are found in foods such as flaxseeds and coldwater fish, like sardines, salmon, and mackerel.  Limit how much you eat of the following: ? Canned or prepackaged foods. ? Food that is high in trans fat, such as fried foods. ? Food that is high in saturated fat, such as fatty meat. ? Sweets, desserts, sugary drinks, and other foods with added sugar. ? Full-fat dairy products.  Do not salt foods before eating.  Try to eat at least 2 vegetarian meals each week.  Eat more home-cooked food and less restaurant, buffet, and fast food.  When eating at a restaurant, ask that your food be prepared with less salt or no salt, if possible. What foods are recommended? The items listed may not be a complete list. Talk with your dietitian about   what dietary choices are best for you. Grains Whole-grain or whole-wheat bread. Whole-grain or whole-wheat pasta. Brown rice. Oatmeal. Quinoa. Bulgur. Whole-grain and low-sodium cereals. Pita bread. Low-fat, low-sodium crackers. Whole-wheat flour tortillas. Vegetables Fresh or frozen vegetables (raw, steamed, roasted, or grilled). Low-sodium or reduced-sodium tomato and vegetable juice. Low-sodium or reduced-sodium tomato sauce and tomato paste. Low-sodium or reduced-sodium canned vegetables. Fruits All fresh, dried, or frozen fruit. Canned fruit in natural juice (without  added sugar). Meat and other protein foods Skinless chicken or turkey. Ground chicken or turkey. Pork with fat trimmed off. Fish and seafood. Egg whites. Dried beans, peas, or lentils. Unsalted nuts, nut butters, and seeds. Unsalted canned beans. Lean cuts of beef with fat trimmed off. Low-sodium, lean deli meat. Dairy Low-fat (1%) or fat-free (skim) milk. Fat-free, low-fat, or reduced-fat cheeses. Nonfat, low-sodium ricotta or cottage cheese. Low-fat or nonfat yogurt. Low-fat, low-sodium cheese. Fats and oils Soft margarine without trans fats. Vegetable oil. Low-fat, reduced-fat, or light mayonnaise and salad dressings (reduced-sodium). Canola, safflower, olive, soybean, and sunflower oils. Avocado. Seasoning and other foods Herbs. Spices. Seasoning mixes without salt. Unsalted popcorn and pretzels. Fat-free sweets. What foods are not recommended? The items listed may not be a complete list. Talk with your dietitian about what dietary choices are best for you. Grains Baked goods made with fat, such as croissants, muffins, or some breads. Dry pasta or rice meal packs. Vegetables Creamed or fried vegetables. Vegetables in a cheese sauce. Regular canned vegetables (not low-sodium or reduced-sodium). Regular canned tomato sauce and paste (not low-sodium or reduced-sodium). Regular tomato and vegetable juice (not low-sodium or reduced-sodium). Pickles. Olives. Fruits Canned fruit in a light or heavy syrup. Fried fruit. Fruit in cream or butter sauce. Meat and other protein foods Fatty cuts of meat. Ribs. Fried meat. Bacon. Sausage. Bologna and other processed lunch meats. Salami. Fatback. Hotdogs. Bratwurst. Salted nuts and seeds. Canned beans with added salt. Canned or smoked fish. Whole eggs or egg yolks. Chicken or turkey with skin. Dairy Whole or 2% milk, cream, and half-and-half. Whole or full-fat cream cheese. Whole-fat or sweetened yogurt. Full-fat cheese. Nondairy creamers. Whipped toppings.  Processed cheese and cheese spreads. Fats and oils Butter. Stick margarine. Lard. Shortening. Ghee. Bacon fat. Tropical oils, such as coconut, palm kernel, or palm oil. Seasoning and other foods Salted popcorn and pretzels. Onion salt, garlic salt, seasoned salt, table salt, and sea salt. Worcestershire sauce. Tartar sauce. Barbecue sauce. Teriyaki sauce. Soy sauce, including reduced-sodium. Steak sauce. Canned and packaged gravies. Fish sauce. Oyster sauce. Cocktail sauce. Horseradish that you find on the shelf. Ketchup. Mustard. Meat flavorings and tenderizers. Bouillon cubes. Hot sauce and Tabasco sauce. Premade or packaged marinades. Premade or packaged taco seasonings. Relishes. Regular salad dressings. Where to find more information:  National Heart, Lung, and Blood Institute: www.nhlbi.nih.gov  American Heart Association: www.heart.org Summary  The DASH eating plan is a healthy eating plan that has been shown to reduce high blood pressure (hypertension). It may also reduce your risk for type 2 diabetes, heart disease, and stroke.  With the DASH eating plan, you should limit salt (sodium) intake to 2,300 mg a day. If you have hypertension, you may need to reduce your sodium intake to 1,500 mg a day.  When on the DASH eating plan, aim to eat more fresh fruits and vegetables, whole grains, lean proteins, low-fat dairy, and heart-healthy fats.  Work with your health care provider or diet and nutrition specialist (dietitian) to adjust your eating plan to your   individual calorie needs. This information is not intended to replace advice given to you by your health care provider. Make sure you discuss any questions you have with your health care provider. Document Revised: 04/30/2017 Document Reviewed: 05/11/2016 Elsevier Patient Education  2020 [employer]  

## 2019-12-31 ENCOUNTER — Other Ambulatory Visit: Payer: Self-pay | Admitting: Family Medicine

## 2019-12-31 DIAGNOSIS — R739 Hyperglycemia, unspecified: Secondary | ICD-10-CM

## 2020-01-04 ENCOUNTER — Other Ambulatory Visit: Payer: Self-pay

## 2020-01-04 ENCOUNTER — Other Ambulatory Visit (INDEPENDENT_AMBULATORY_CARE_PROVIDER_SITE_OTHER): Payer: Federal, State, Local not specified - PPO

## 2020-01-04 DIAGNOSIS — R739 Hyperglycemia, unspecified: Secondary | ICD-10-CM | POA: Diagnosis not present

## 2020-01-04 LAB — BASIC METABOLIC PANEL
BUN: 11 mg/dL (ref 6–23)
CO2: 26 mEq/L (ref 19–32)
Calcium: 9.3 mg/dL (ref 8.4–10.5)
Chloride: 106 mEq/L (ref 96–112)
Creatinine, Ser: 0.84 mg/dL (ref 0.40–1.20)
GFR: 71.14 mL/min (ref >60.00)
Glucose, Bld: 125 mg/dL — ABNORMAL HIGH (ref 70–99)
Potassium: 4 mEq/L (ref 3.5–5.1)
Sodium: 138 mEq/L (ref 135–145)

## 2020-01-04 LAB — HEMOGLOBIN A1C: Hgb A1c MFr Bld: 6.5 % (ref 4.6–6.5)

## 2020-01-09 ENCOUNTER — Other Ambulatory Visit: Payer: Self-pay

## 2020-01-09 DIAGNOSIS — E119 Type 2 diabetes mellitus without complications: Secondary | ICD-10-CM

## 2020-01-09 MED ORDER — BLOOD GLUCOSE METER KIT: PACK | 0 refills | Status: AC

## 2020-01-10 ENCOUNTER — Other Ambulatory Visit: Payer: Self-pay

## 2020-01-10 ENCOUNTER — Ambulatory Visit (INDEPENDENT_AMBULATORY_CARE_PROVIDER_SITE_OTHER): Payer: Federal, State, Local not specified - PPO

## 2020-01-10 DIAGNOSIS — Z1231 Encounter for screening mammogram for malignant neoplasm of breast: Secondary | ICD-10-CM

## 2020-01-16 ENCOUNTER — Telehealth: Payer: Self-pay | Admitting: Family Medicine

## 2020-01-16 DIAGNOSIS — I10 Essential (primary) hypertension: Secondary | ICD-10-CM

## 2020-01-16 NOTE — Telephone Encounter (Signed)
Orders placed.

## 2020-01-16 NOTE — Telephone Encounter (Signed)
Pt called in to schedule labs in 3 months. I scheduled appointment for 04/08/20 but there are no lab orders. Please make sure lab orders are placed for this appointment. Thanks.

## 2020-01-17 ENCOUNTER — Ambulatory Visit (INDEPENDENT_AMBULATORY_CARE_PROVIDER_SITE_OTHER): Payer: Managed Care, Other (non HMO) | Admitting: Bariatrics

## 2020-03-30 ENCOUNTER — Other Ambulatory Visit: Payer: Self-pay | Admitting: Family Medicine

## 2020-03-30 DIAGNOSIS — Z3009 Encounter for other general counseling and advice on contraception: Secondary | ICD-10-CM

## 2020-04-08 ENCOUNTER — Other Ambulatory Visit (INDEPENDENT_AMBULATORY_CARE_PROVIDER_SITE_OTHER): Payer: Federal, State, Local not specified - PPO

## 2020-04-08 ENCOUNTER — Other Ambulatory Visit: Payer: Self-pay

## 2020-04-08 DIAGNOSIS — I10 Essential (primary) hypertension: Secondary | ICD-10-CM

## 2020-04-08 LAB — BASIC METABOLIC PANEL
BUN: 14 mg/dL (ref 6–23)
CO2: 27 mEq/L (ref 19–32)
Calcium: 9.6 mg/dL (ref 8.4–10.5)
Chloride: 103 mEq/L (ref 96–112)
Creatinine, Ser: 0.88 mg/dL (ref 0.40–1.20)
GFR: 75.53 mL/min (ref >60.00)
Glucose, Bld: 118 mg/dL — ABNORMAL HIGH (ref 70–99)
Potassium: 4 mEq/L (ref 3.5–5.1)
Sodium: 138 mEq/L (ref 135–145)

## 2020-04-08 LAB — HEMOGLOBIN A1C: Hgb A1c MFr Bld: 6.4 % (ref 4.6–6.5)

## 2020-06-28 ENCOUNTER — Encounter: Payer: Federal, State, Local not specified - PPO | Admitting: Family Medicine

## 2020-06-28 ENCOUNTER — Other Ambulatory Visit: Payer: Self-pay

## 2020-06-28 ENCOUNTER — Ambulatory Visit: Payer: Federal, State, Local not specified - PPO | Admitting: Family Medicine

## 2020-06-28 ENCOUNTER — Encounter: Payer: Self-pay | Admitting: Family Medicine

## 2020-06-28 VITALS — BP 132/82 | HR 67 | Temp 98.8°F | Wt 216.0 lb

## 2020-06-28 DIAGNOSIS — N904 Leukoplakia of vulva: Secondary | ICD-10-CM | POA: Diagnosis not present

## 2020-06-28 DIAGNOSIS — E785 Hyperlipidemia, unspecified: Secondary | ICD-10-CM | POA: Diagnosis not present

## 2020-06-28 DIAGNOSIS — T782XXA Anaphylactic shock, unspecified, initial encounter: Secondary | ICD-10-CM

## 2020-06-28 DIAGNOSIS — M546 Pain in thoracic spine: Secondary | ICD-10-CM

## 2020-06-28 DIAGNOSIS — I10 Essential (primary) hypertension: Secondary | ICD-10-CM

## 2020-06-28 DIAGNOSIS — F411 Generalized anxiety disorder: Secondary | ICD-10-CM

## 2020-06-28 DIAGNOSIS — R739 Hyperglycemia, unspecified: Secondary | ICD-10-CM

## 2020-06-28 DIAGNOSIS — K219 Gastro-esophageal reflux disease without esophagitis: Secondary | ICD-10-CM

## 2020-06-28 LAB — COMPREHENSIVE METABOLIC PANEL
ALT: 24 U/L (ref 0–35)
AST: 17 U/L (ref 0–37)
Albumin: 4.2 g/dL (ref 3.5–5.2)
Alkaline Phosphatase: 64 U/L (ref 39–117)
BUN: 14 mg/dL (ref 6–23)
CO2: 28 mEq/L (ref 19–32)
Calcium: 9.6 mg/dL (ref 8.4–10.5)
Chloride: 103 mEq/L (ref 96–112)
Creatinine, Ser: 0.83 mg/dL (ref 0.40–1.20)
GFR: 80.89 mL/min (ref >60.00)
Glucose, Bld: 117 mg/dL — ABNORMAL HIGH (ref 70–99)
Potassium: 3.8 mEq/L (ref 3.5–5.1)
Sodium: 139 mEq/L (ref 135–145)
Total Bilirubin: 0.6 mg/dL (ref 0.2–1.2)
Total Protein: 6.8 g/dL (ref 6.0–8.3)

## 2020-06-28 LAB — LIPID PANEL
Cholesterol: 151 mg/dL (ref 0–200)
HDL: 50.8 mg/dL (ref >39.00)
LDL Cholesterol: 82 mg/dL (ref 0–99)
NonHDL: 100.4
Total CHOL/HDL Ratio: 3
Triglycerides: 92 mg/dL (ref 0.0–149.0)
VLDL: 18.4 mg/dL (ref 0.0–40.0)

## 2020-06-28 LAB — CBC WITH DIFFERENTIAL/PLATELET
Basophils Absolute: 0 10*3/uL (ref 0.0–0.1)
Basophils Relative: 0.4 % (ref 0.0–3.0)
Eosinophils Absolute: 0.1 10*3/uL (ref 0.0–0.7)
Eosinophils Relative: 1 % (ref 0.0–5.0)
HCT: 41.9 % (ref 36.0–46.0)
Hemoglobin: 13.9 g/dL (ref 12.0–15.0)
Lymphocytes Relative: 22.1 % (ref 12.0–46.0)
Lymphs Abs: 1.6 10*3/uL (ref 0.7–4.0)
MCHC: 33.2 g/dL (ref 30.0–36.0)
MCV: 92.1 fl (ref 78.0–100.0)
Monocytes Absolute: 0.5 10*3/uL (ref 0.1–1.0)
Monocytes Relative: 7 % (ref 3.0–12.0)
Neutro Abs: 5 10*3/uL (ref 1.4–7.7)
Neutrophils Relative %: 69.5 % (ref 43.0–77.0)
Platelets: 262 10*3/uL (ref 150.0–400.0)
RBC: 4.55 Mil/uL (ref 3.87–5.11)
RDW: 13.7 % (ref 11.5–15.5)
WBC: 7.2 10*3/uL (ref 4.0–10.5)

## 2020-06-28 LAB — HEMOGLOBIN A1C: Hgb A1c MFr Bld: 6.4 % (ref 4.6–6.5)

## 2020-06-28 LAB — MICROALBUMIN / CREATININE URINE RATIO
Creatinine,U: 88.1 mg/dL
Microalb Creat Ratio: 0.8 mg/g (ref 0.0–30.0)
Microalb, Ur: <0.7 mg/dL (ref 0.0–1.9)

## 2020-06-28 LAB — TSH: TSH: 0.84 u[IU]/mL (ref 0.35–4.50)

## 2020-06-28 MED ORDER — OMEPRAZOLE 40 MG PO CPDR: DELAYED_RELEASE_CAPSULE | ORAL | 3 refills | Status: DC

## 2020-06-28 MED ORDER — CLOBETASOL PROPIONATE 0.05 % EX OINT: TOPICAL_OINTMENT | CUTANEOUS | 5 refills | Status: DC

## 2020-06-28 MED ORDER — ATORVASTATIN CALCIUM 10 MG PO TABS: 10.0000 mg | ORAL_TABLET | Freq: Every day | ORAL | 3 refills | Status: DC

## 2020-06-28 MED ORDER — EPINEPHRINE 0.3 MG/0.3ML IJ SOAJ: 0.3000 mg | INTRAMUSCULAR | 1 refills | Status: DC | PRN

## 2020-06-28 MED ORDER — HYDROCHLOROTHIAZIDE 25 MG PO TABS: 25.0000 mg | ORAL_TABLET | Freq: Every day | ORAL | 3 refills | Status: DC

## 2020-06-28 MED ORDER — METHOCARBAMOL 500 MG PO TABS: 500.0000 mg | ORAL_TABLET | Freq: Four times a day (QID) | ORAL | 1 refills | Status: DC | PRN

## 2020-06-28 MED ORDER — ATENOLOL 100 MG PO TABS: 100.0000 mg | ORAL_TABLET | Freq: Every day | ORAL | 3 refills | Status: DC

## 2020-06-28 MED ORDER — AMLODIPINE BESYLATE 5 MG PO TABS: 5.0000 mg | ORAL_TABLET | Freq: Every day | ORAL | 3 refills | Status: DC

## 2020-06-28 MED ORDER — ALPRAZOLAM 0.25 MG PO TABS: 0.2500 mg | ORAL_TABLET | Freq: Three times a day (TID) | ORAL | 2 refills | Status: DC | PRN

## 2020-06-28 NOTE — Assessment & Plan Note (Signed)
Stable con't meds 

## 2020-06-28 NOTE — Progress Notes (Signed)
Patient ID: [name] Ramos, female    DOB: [birth year]  Age: 55 y.o. MRN: [id number]    Subjective:  Subjective  HPI [name] Ramos presents for f/u bp, chol , gerd    No new complaints   Review of Systems  Constitutional: Negative for appetite change, diaphoresis, fatigue and unexpected weight change.  Eyes: Negative for pain, redness and visual disturbance.  Respiratory: Negative for cough, chest tightness, shortness of breath and wheezing.   Cardiovascular: Negative for chest pain, palpitations and leg swelling.  Endocrine: Negative for cold intolerance, heat intolerance, polydipsia, polyphagia and polyuria.  Genitourinary: Negative for difficulty urinating, dysuria and frequency.  Neurological: Negative for dizziness, light-headedness, numbness and headaches.    History Past Medical History:  Diagnosis Date  . Alcohol abuse    recovering alcoholic  . Allergic urticaria   . Anxiety   . Cholecystitis, acute   . Congenital anomaly of neck   . Depression   . Dyslipidemia    Followed by PCP  . Gastroparesis   . GERD (gastroesophageal reflux disease)   . Hematuria   . Hiatal hernia    with reflux  . Hypertension    stress test 11/2010 normal  . Kidney stone   . Obesity   . PVC's (premature ventricular contractions)    Controlled on beta blocker therapy;  Echo 9/10: EF 32-99%, grade 1 diastolic dysfunction, mild MR, mild LAE  . Thyroid nodule    hx  . Tinea corporis   . Unspecified contraceptive management   . Urinary tract infection     She has a past surgical history that includes Tonsillectomy and leep surgery.   [name] Ramos family history includes Breast cancer (age of onset: 61) in [name] Ramos mother; Colon polyps in [name] Ramos mother; Coronary artery disease in an other family member; Diabetes in [name] Ramos maternal grandmother and mother; Heart attack (age of onset: 20) in [name] Ramos maternal grandmother; Hyperlipidemia in [name] Ramos father; Hypertension in [name] Ramos father, mother, and another family member;  Irritable bowel syndrome in [name] Ramos mother; Liver disease in [name] Ramos mother; Lung cancer in [name] Ramos maternal grandmother, maternal uncle, and paternal grandmother; Thyroid disease in [name] Ramos mother.She reports that she quit smoking about 2 years ago. She has never used smokeless tobacco. She reports that she does not drink alcohol and does not use drugs.  Current Outpatient Medications on File Prior to Visit  Medication Sig Dispense Refill  . aspirin 81 MG tablet Take 81 mg by mouth daily.    . blood glucose meter kit and supplies Dispense based on patient and insurance preference. Use up to four times daily as directed. (FOR ICD-10 E10.9, E11.9). 1 each 0  . Ergocalciferol (VITAMIN D2 PO) Take 1,000 mg by mouth daily.    . medroxyPROGESTERone Acetate 150 MG/ML SUSY INJECT 1 ML (150 MG TOTAL) AS DIRECTED EVERY 3 (THREE) MONTHS FOR 1 DAY. 1 mL 2   No current facility-administered medications on file prior to visit.     Objective:  Objective  Physical Exam Vitals and nursing note reviewed.  Constitutional:      Appearance: She is well-developed and well-nourished.  HENT:     Head: Normocephalic and atraumatic.  Eyes:     Extraocular Movements: EOM normal.     Conjunctiva/sclera: Conjunctivae normal.  Neck:     Thyroid: No thyromegaly.     Vascular: No carotid bruit or JVD.  Cardiovascular:     Rate and Rhythm: Normal rate and regular rhythm.     Heart sounds: Normal heart sounds.  No murmur heard.   Pulmonary:     Effort: Pulmonary effort is normal. No respiratory distress.     Breath sounds: Normal breath sounds. No wheezing or rales.  Chest:     Chest wall: No tenderness.  Musculoskeletal:        General: No edema.     Cervical back: Normal range of motion and neck supple.  Neurological:     Mental Status: She is alert and oriented to person, place, and time.  Psychiatric:        Mood and Affect: Mood and affect normal.    BP 132/82 (BP Location: Left Arm, Patient Position: Sitting, Cuff  Size: Large)   Pulse 67   Temp 98.8 F (37.1 C) (Oral)   Wt 216 lb (98 kg)   SpO2 99%   BMI 35.94 kg/m  Wt Readings from Last 3 Encounters:  06/28/20 216 lb (98 kg)  12/28/19 (!) 215 lb (97.5 kg)  10/04/19 219 lb 6.4 oz (99.5 kg)     Lab Results  Component Value Date   WBC 8.1 06/29/2019   HGB 13.9 06/29/2019   HCT 41.6 06/29/2019   PLT 296.0 06/29/2019   GLUCOSE 118 (H) 04/08/2020   CHOL 134 12/28/2019   TRIG 88.0 12/28/2019   HDL 46.70 12/28/2019   LDLDIRECT 148.1 01/20/2008   LDLCALC 69 12/28/2019   ALT 26 12/28/2019   AST 20 12/28/2019   NA 138 04/08/2020   K 4.0 04/08/2020   CL 103 04/08/2020   CREATININE 0.88 04/08/2020   BUN 14 04/08/2020   CO2 27 04/08/2020   TSH 0.62 06/29/2019   HGBA1C 6.4 04/08/2020    DG SCOLIOSIS EVAL COMPLETE SPINE 2 OR 3 VIEWS  Result Date: 01/30/2019 CLINICAL DATA:  Thoracic spine history of scoliosis EXAM: DG SCOLIOSIS EVAL COMPLETE SPINE 2-3V COMPARISON:  None. FINDINGS: There is a mild levoconvex scoliotic curvature of the thoracolumbar spine centered at L1 measuring 7 degrees. The vertebral body heights appear to be well maintained. No acute osseous abnormality is noted. IMPRESSION: Mild levoconvex scoliotic curvature of the thoracolumbar spine. Electronically Signed   By: Prudencio Pair M.D.   On: 01/30/2019 11:41     Assessment & Plan:  Plan  I have changed [name] Ramos's EPINEPHrine. I am also having [name] Ramos maintain [name] Ramos aspirin, Ergocalciferol (VITAMIN D2 PO), blood glucose meter kit and supplies, medroxyPROGESTERone Acetate, atenolol, amLODipine, hydrochlorothiazide, clobetasol ointment, methocarbamol, atorvastatin, omeprazole, and ALPRAZolam.  Meds ordered this encounter  Medications  . atenolol (TENORMIN) 100 MG tablet    Sig: Take 1 tablet (100 mg total) by mouth daily.    Dispense:  90 tablet    Refill:  3  . amLODipine (NORVASC) 5 MG tablet    Sig: Take 1 tablet (5 mg total) by mouth daily.    Dispense:  90 tablet     Refill:  3  . hydrochlorothiazide (HYDRODIURIL) 25 MG tablet    Sig: Take 1 tablet (25 mg total) by mouth daily.    Dispense:  90 tablet    Refill:  3  . EPINEPHrine 0.3 mg/0.3 mL IJ SOAJ injection    Sig: Inject 0.3 mg into the muscle as needed for anaphylaxis.    Dispense:  2 each    Refill:  1  . clobetasol ointment (TEMOVATE) 0.05 %    Sig: Apply to affected area every night for 4 weeks, then every other day for 4 weeks and then twice a week    Dispense:  60  g    Refill:  5  . methocarbamol (ROBAXIN) 500 MG tablet    Sig: Take 1 tablet (500 mg total) by mouth every 6 (six) hours as needed for muscle spasms.    Dispense:  30 tablet    Refill:  1  . atorvastatin (LIPITOR) 10 MG tablet    Sig: Take 1 tablet (10 mg total) by mouth daily.    Dispense:  90 tablet    Refill:  3  . omeprazole (PRILOSEC) 40 MG capsule    Sig: TAKE 1 CAPSULE BY MOUTH EVERY DAY    Dispense:  90 capsule    Refill:  3  . ALPRAZolam (XANAX) 0.25 MG tablet    Sig: Take 1 tablet (0.25 mg total) by mouth 3 (three) times daily as needed for anxiety.    Dispense:  30 tablet    Refill:  2    Problem List Items Addressed This Visit      Unprioritized   Essential hypertension   Relevant Medications   atenolol (TENORMIN) 100 MG tablet   amLODipine (NORVASC) 5 MG tablet   hydrochlorothiazide (HYDRODIURIL) 25 MG tablet   EPINEPHrine 0.3 mg/0.3 mL IJ SOAJ injection   atorvastatin (LIPITOR) 10 MG tablet   Other Relevant Orders   Lipid panel   Comprehensive metabolic panel   TSH   CBC with Differential/Platelet   Microalbumin / creatinine urine ratio   Hyperlipidemia LDL goal <100   Relevant Medications   atenolol (TENORMIN) 100 MG tablet   amLODipine (NORVASC) 5 MG tablet   hydrochlorothiazide (HYDRODIURIL) 25 MG tablet   EPINEPHrine 0.3 mg/0.3 mL IJ SOAJ injection   atorvastatin (LIPITOR) 10 MG tablet   Other Relevant Orders   Lipid panel   Comprehensive metabolic panel   TSH   CBC with  Differential/Platelet   Microalbumin / creatinine urine ratio    Other Visit Diagnoses    Hyperglycemia    -  Primary   Relevant Orders   Hemoglobin A1c   TSH   CBC with Differential/Platelet   Microalbumin / creatinine urine ratio   Anaphylaxis, initial encounter       Relevant Medications   EPINEPHrine 0.3 mg/0.3 mL IJ SOAJ injection   Leukoplakia of vulva       Relevant Medications   clobetasol ointment (TEMOVATE) 0.05 %   Thoracic spine pain       Relevant Medications   methocarbamol (ROBAXIN) 500 MG tablet   Gastroesophageal reflux disease       Relevant Medications   omeprazole (PRILOSEC) 40 MG capsule   Generalized anxiety disorder       Relevant Medications   ALPRAZolam (XANAX) 0.25 MG tablet      Follow-up: Return in about 6 months (around 12/26/2020), or if symptoms worsen or fail to improve, for annual exam, fasting.  Ann Held, DO

## 2020-06-28 NOTE — Assessment & Plan Note (Signed)
Encouraged heart healthy diet, increase exercise, avoid trans fats, consider a krill oil cap daily 

## 2020-06-28 NOTE — Patient Instructions (Signed)

## 2020-06-28 NOTE — Assessment & Plan Note (Signed)
Well controlled, no changes to meds. Encouraged heart healthy diet such as the DASH diet and exercise as tolerated.  °

## 2020-07-02 ENCOUNTER — Other Ambulatory Visit: Payer: Self-pay

## 2020-07-02 NOTE — Telephone Encounter (Signed)
Patient called and scheduled for annual exam. Patient would like her clobetasol ointment changed back to a cream. Called and changed the patients prescription. Armandina Stammer RN

## 2020-07-05 ENCOUNTER — Encounter: Payer: Federal, State, Local not specified - PPO | Admitting: Family Medicine

## 2020-07-18 ENCOUNTER — Other Ambulatory Visit: Payer: Self-pay

## 2020-07-19 ENCOUNTER — Ambulatory Visit (INDEPENDENT_AMBULATORY_CARE_PROVIDER_SITE_OTHER): Payer: Federal, State, Local not specified - PPO | Admitting: Family Medicine

## 2020-07-19 ENCOUNTER — Encounter: Payer: Self-pay | Admitting: Family Medicine

## 2020-07-19 VITALS — BP 110/72 | HR 57 | Temp 98.2°F | Resp 18 | Ht 65.0 in | Wt 217.2 lb

## 2020-07-19 DIAGNOSIS — E1169 Type 2 diabetes mellitus with other specified complication: Secondary | ICD-10-CM | POA: Diagnosis not present

## 2020-07-19 DIAGNOSIS — Z0000 Encounter for general adult medical examination without abnormal findings: Secondary | ICD-10-CM | POA: Diagnosis not present

## 2020-07-19 DIAGNOSIS — E785 Hyperlipidemia, unspecified: Secondary | ICD-10-CM

## 2020-07-19 DIAGNOSIS — E119 Type 2 diabetes mellitus without complications: Secondary | ICD-10-CM | POA: Insufficient documentation

## 2020-07-19 DIAGNOSIS — I10 Essential (primary) hypertension: Secondary | ICD-10-CM | POA: Diagnosis not present

## 2020-07-19 DIAGNOSIS — Z1159 Encounter for screening for other viral diseases: Secondary | ICD-10-CM

## 2020-07-19 NOTE — Progress Notes (Signed)
Subjective:     [name] Ramos is a 55 y.o. female and is here for a comprehensive physical exam. The patient reports no new problems .  Social History   Socioeconomic History  . Marital status: Single    Spouse name: Not on file  . Number of children: Not on file  . Years of education: Not on file  . Highest education level: Not on file  Occupational History  . Occupation: post Office manager: [employer]  Tobacco Use  . Smoking status: Former Smoker    Quit date: 06/16/2018    Years since quitting: 2.0  . Smokeless tobacco: Never Used  Vaping Use  . Vaping Use: Never used  Substance and Sexual Activity  . Alcohol use: No    Alcohol/week: 0.0 standard drinks  . Drug use: No  . Sexual activity: Not Currently    Partners: Male  Other Topics Concern  . Not on file  Social History Narrative   Exercising---walking dog daily   Social Determinants of Health   Financial Resource Strain: Not on file  Food Insecurity: Not on file  Transportation Needs: Not on file  Physical Activity: Not on file  Stress: Not on file  Social Connections: Not on file  Intimate Partner Violence: Not on file   Health Maintenance  Topic Date Due  . Hepatitis C Screening  Never done  . INFLUENZA VACCINE  08/29/2020 (Originally 12/31/2019)  . HIV Screening  08/28/2035 (Originally 10/16/1982)  . URINE MICROALBUMIN  06/28/2021  . PAP SMEAR-Modifier  07/18/2021  . COLONOSCOPY (Pts 45-71yr Insurance coverage will need to be confirmed)  11/09/2021  . MAMMOGRAM  01/09/2022  . TETANUS/TDAP  02/05/2027  . COVID-19 Vaccine  Completed    The following portions of the patient's history were reviewed and updated as appropriate:  She  has a past medical history of Alcohol abuse, Allergic urticaria, Anxiety, Cholecystitis, acute, Congenital anomaly of neck, Depression, Dyslipidemia, Gastroparesis, GERD (gastroesophageal reflux disease), Hematuria, Hiatal hernia, Hypertension, Kidney stone, Obesity,  PVC's (premature ventricular contractions), Thyroid nodule, Tinea corporis, Unspecified contraceptive management, and Urinary tract infection. She does not have any pertinent problems on file. She  has a past surgical history that includes Tonsillectomy and leep surgery. Her family history includes Breast cancer (age of onset: 64 in her mother; Colon polyps in her mother; Coronary artery disease in an other family member; Diabetes in her maternal grandmother and mother; Heart attack (age of onset: 435 in her maternal grandmother; Hyperlipidemia in her father; Hypertension in her father, mother, and another family member; Irritable bowel syndrome in her mother; Liver disease in her mother; Lung cancer in her maternal grandmother, maternal uncle, and paternal grandmother; Thyroid disease in her mother. She  reports that she quit smoking about 2 years ago. She has never used smokeless tobacco. She reports that she does not drink alcohol and does not use drugs. She has a current medication list which includes the following prescription(s): alprazolam, amlodipine, aspirin, atenolol, atorvastatin, blood glucose meter kit and supplies, clobetasol ointment, epinephrine, ergocalciferol, hydrochlorothiazide, methocarbamol, omeprazole, and medroxyprogesterone acetate. Current Outpatient Medications on File Prior to Visit  Medication Sig Dispense Refill  . ALPRAZolam (XANAX) 0.25 MG tablet Take 1 tablet (0.25 mg total) by mouth 3 (three) times daily as needed for anxiety. 30 tablet 2  . amLODipine (NORVASC) 5 MG tablet Take 1 tablet (5 mg total) by mouth daily. 90 tablet 3  . aspirin 81 MG tablet Take 81 mg by mouth  daily.    . atenolol (TENORMIN) 100 MG tablet Take 1 tablet (100 mg total) by mouth daily. 90 tablet 3  . atorvastatin (LIPITOR) 10 MG tablet Take 1 tablet (10 mg total) by mouth daily. 90 tablet 3  . blood glucose meter kit and supplies Dispense based on patient and insurance preference. Use up to  four times daily as directed. (FOR ICD-10 E10.9, E11.9). 1 each 0  . clobetasol ointment (TEMOVATE) 0.05 % Apply to affected area every night for 4 weeks, then every other day for 4 weeks and then twice a week 60 g 5  . EPINEPHrine 0.3 mg/0.3 mL IJ SOAJ injection Inject 0.3 mg into the muscle as needed for anaphylaxis. 2 each 1  . Ergocalciferol (VITAMIN D2 PO) Take 1,000 mg by mouth daily.    . hydrochlorothiazide (HYDRODIURIL) 25 MG tablet Take 1 tablet (25 mg total) by mouth daily. 90 tablet 3  . methocarbamol (ROBAXIN) 500 MG tablet Take 1 tablet (500 mg total) by mouth every 6 (six) hours as needed for muscle spasms. 30 tablet 1  . omeprazole (PRILOSEC) 40 MG capsule TAKE 1 CAPSULE BY MOUTH EVERY DAY 90 capsule 3  . medroxyPROGESTERone Acetate 150 MG/ML SUSY INJECT 1 ML (150 MG TOTAL) AS DIRECTED EVERY 3 (THREE) MONTHS FOR 1 DAY. 1 mL 2   No current facility-administered medications on file prior to visit.   She is allergic to ace inhibitors, bee venom, lisinopril, and piroxicam..  Review of Systems Review of Systems  Constitutional: Negative for activity change, appetite change and fatigue.  HENT: Negative for hearing loss, congestion, tinnitus and ear discharge.  dentist q23mEyes: Negative for visual disturbance (see optho q1y -- vision corrected to 20/20 with glasses).  Respiratory: Negative for cough, chest tightness and shortness of breath.   Cardiovascular: Negative for chest pain, palpitations and leg swelling.  Gastrointestinal: Negative for abdominal pain, diarrhea, constipation and abdominal distention.  Genitourinary: Negative for urgency, frequency, decreased urine volume and difficulty urinating.  Musculoskeletal: Negative for back pain, arthralgias and gait problem.  Skin: Negative for color change, pallor and rash.  Neurological: Negative for dizziness, light-headedness, numbness and headaches.  Hematological: Negative for adenopathy. Does not bruise/bleed easily.   Psychiatric/Behavioral: Negative for suicidal ideas, confusion, sleep disturbance, self-injury, dysphoric mood, decreased concentration and agitation.       Objective:    BP 110/72 (BP Location: Left Arm, Patient Position: Sitting, Cuff Size: Large)   Pulse (!) 57   Temp 98.2 F (36.8 C) (Oral)   Resp 18   Ht 5' 5"  (1.651 m)   Wt 217 lb 3.2 oz (98.5 kg)   SpO2 100%   BMI 36.14 kg/m  General appearance: alert, cooperative, appears stated age and no distress Head: Normocephalic, without obvious abnormality, atraumatic Eyes: negative findings: lids and lashes normal, conjunctivae and sclerae normal and pupils equal, round, reactive to light and accomodation Ears: normal TM's and external ear canals both ears Neck: no adenopathy, no carotid bruit, no JVD, supple, symmetrical, trachea midline and thyroid not enlarged, symmetric, no tenderness/mass/nodules Back: symmetric, no curvature. ROM normal. No CVA tenderness. Lungs: clear to auscultation bilaterally Breasts: gyn Heart: regular rate and rhythm, S1, S2 normal, no murmur, click, rub or gallop Abdomen: soft, non-tender; bowel sounds normal; no masses,  no organomegaly Pelvic: deferred--gyn Extremities: extremities normal, atraumatic, no cyanosis or edema Pulses: 2+ and symmetric Skin: Skin color, texture, turgor normal. No rashes or lesions Lymph nodes: Cervical, supraclavicular, and axillary nodes normal. Neurologic: Alert  and oriented X 3, normal strength and tone. Normal symmetric reflexes. Normal coordination and gait    Assessment:    Healthy female exam.      Plan:    ghm utd Check labs  See After Visit Summary for Counseling Recommendations    1. Diet-controlled diabetes mellitus ([place]) Check labs in April / May - Lipid panel; Future - Hemoglobin A1c; Future - Comprehensive metabolic panel; Future  2. Primary hypertension Well controlled, no changes to meds. Encouraged heart healthy diet such as the DASH diet  and exercise as tolerated.  - Lipid panel; Future - Hemoglobin A1c; Future - Comprehensive metabolic panel; Future  3. Hyperlipidemia associated with type 2 diabetes mellitus ([place]) Tolerating statin, encouraged heart healthy diet, avoid trans fats, minimize simple carbs and saturated fats. Increase exercise as tolerated - Lipid panel; Future - Hemoglobin A1c; Future - Comprehensive metabolic panel; Future  4. Need for hepatitis C screening test   - Hepatitis C antibody; Future  5. Preventative health care See above

## 2020-07-19 NOTE — Patient Instructions (Signed)
Preventive Care 105-53 Years Old, Female Preventive care refers to lifestyle choices and visits with your health care provider that can promote health and wellness. This includes:  A yearly physical exam. This is also called an annual wellness visit.  Regular dental and eye exams.  Immunizations.  Screening for certain conditions.  Healthy lifestyle choices, such as: ? Eating a healthy diet. ? Getting regular exercise. ? Not using drugs or products that contain nicotine and tobacco. ? Limiting alcohol use. What can I expect for my preventive care visit? Physical exam Your health care provider will check your:  Height and weight. These may be used to calculate your BMI (body mass index). BMI is a measurement that tells if you are at a healthy weight.  Heart rate and blood pressure.  Body temperature.  Skin for abnormal spots. Counseling Your health care provider may ask you questions about your:  Past medical problems.  Family's medical history.  Alcohol, tobacco, and drug use.  Emotional well-being.  Home life and relationship well-being.  Sexual activity.  Diet, exercise, and sleep habits.  Work and work Statistician.  Access to firearms.  Method of birth control.  Menstrual cycle.  Pregnancy history. What immunizations do I need? Vaccines are usually given at various ages, according to a schedule. Your health care provider will recommend vaccines for you based on your age, medical history, and lifestyle or other factors, such as travel or where you work.   What tests do I need? Blood tests  Lipid and cholesterol levels. These may be checked every 5 years, or more often if you are over 3 years old.  Hepatitis C test.  Hepatitis B test. Screening  Lung cancer screening. You may have this screening every year starting at age 80 if you have a 30-pack-year history of smoking and currently smoke or have quit within the past 15 years.  Colorectal cancer  screening. ? All adults should have this screening starting at age 67 and continuing until age 42. ? Your health care provider may recommend screening at age 63 if you are at increased risk. ? You will have tests every 1-10 years, depending on your results and the type of screening test.  Diabetes screening. ? This is done by checking your blood sugar (glucose) after you have not eaten for a while (fasting). ? You may have this done every 1-3 years.  Mammogram. ? This may be done every 1-2 years. ? Talk with your health care provider about when you should start having regular mammograms. This may depend on whether you have a family history of breast cancer.  BRCA-related cancer screening. This may be done if you have a family history of breast, ovarian, tubal, or peritoneal cancers.  Pelvic exam and Pap test. ? This may be done every 3 years starting at age 63. ? Starting at age 36, this may be done every 5 years if you have a Pap test in combination with an HPV test. Other tests  STD (sexually transmitted disease) testing, if you are at risk.  Bone density scan. This is done to screen for osteoporosis. You may have this scan if you are at high risk for osteoporosis. Talk with your health care provider about your test results, treatment options, and if necessary, the need for more tests. Follow these instructions at home: Eating and drinking  Eat a diet that includes fresh fruits and vegetables, whole grains, lean protein, and low-fat dairy products.  Take vitamin and mineral supplements  as recommended by your health care provider.  Do not drink alcohol if: ? Your health care provider tells you not to drink. ? You are pregnant, may be pregnant, or are planning to become pregnant.  If you drink alcohol: ? Limit how much you have to 0-1 drink a day. ? Be aware of how much alcohol is in your drink. In the [place] one drink equals one 12 oz bottle of beer (355 mL), one 5 oz glass of  wine (148 mL), or one 1 oz glass of hard liquor (44 mL).   Lifestyle  Take daily care of your teeth and gums. Brush your teeth every morning and night with fluoride toothpaste. Floss one time each day.  Stay active. Exercise for at least 30 minutes 5 or more days each week.  Do not use any products that contain nicotine or tobacco, such as cigarettes, e-cigarettes, and chewing tobacco. If you need help quitting, ask your health care provider.  Do not use drugs.  If you are sexually active, practice safe sex. Use a condom or other form of protection to prevent STIs (sexually transmitted infections).  If you do not wish to become pregnant, use a form of birth control. If you plan to become pregnant, see your health care provider for a prepregnancy visit.  If told by your health care provider, take low-dose aspirin daily starting at age 75.  Find healthy ways to cope with stress, such as: ? Meditation, yoga, or listening to music. ? Journaling. ? Talking to a trusted person. ? Spending time with friends and family. Safety  Always wear your seat belt while driving or riding in a vehicle.  Do not drive: ? If you have been drinking alcohol. Do not ride with someone who has been drinking. ? When you are tired or distracted. ? While texting.  Wear a helmet and other protective equipment during sports activities.  If you have firearms in your house, make sure you follow all gun safety procedures. What's next?  Visit your health care provider once a year for an annual wellness visit.  Ask your health care provider how often you should have your eyes and teeth checked.  Stay up to date on all vaccines. This information is not intended to replace advice given to you by your health care provider. Make sure you discuss any questions you have with your health care provider. Document Revised: 02/20/2020 Document Reviewed: 01/27/2018 Elsevier Patient Education  2021 [employer].

## 2020-08-14 ENCOUNTER — Encounter: Payer: Self-pay | Admitting: Family Medicine

## 2020-08-15 NOTE — Telephone Encounter (Signed)
She can take flonase , rhinocort or nasacort--- all otc  She can also take zyrtec, claritin or allegra  ----just no decongestants

## 2020-08-16 ENCOUNTER — Encounter: Payer: Self-pay | Admitting: Obstetrics & Gynecology

## 2020-08-16 ENCOUNTER — Other Ambulatory Visit: Payer: Self-pay

## 2020-08-16 ENCOUNTER — Ambulatory Visit (INDEPENDENT_AMBULATORY_CARE_PROVIDER_SITE_OTHER): Payer: Federal, State, Local not specified - PPO | Admitting: Obstetrics & Gynecology

## 2020-08-16 ENCOUNTER — Other Ambulatory Visit (HOSPITAL_COMMUNITY)
Admission: RE | Admit: 2020-08-16 | Discharge: 2020-08-16 | Disposition: A | Discharge status: Home / Self Care | Payer: Federal, State, Local not specified - PPO | Source: Ambulatory Visit | Attending: Obstetrics & Gynecology | Admitting: Obstetrics & Gynecology

## 2020-08-16 VITALS — BP 147/76 | HR 63 | Ht 65.0 in | Wt 221.0 lb

## 2020-08-16 DIAGNOSIS — Z01419 Encounter for gynecological examination (general) (routine) without abnormal findings: Secondary | ICD-10-CM | POA: Insufficient documentation

## 2020-08-16 NOTE — Patient Instructions (Signed)
Preventing Osteoporosis, Adult Osteoporosis is a condition that causes the bones to lose density. This means that the bones become thinner, and the normal spaces in bone tissue become larger. Low bone density can make the bones weak and cause them to break more easily. Osteoporosis cannot always be prevented, but you can take steps to lower your risk of developing this condition. How can this condition affect me? If you develop osteoporosis, you will be more likely to break bones in your wrist, spine, or hip. Even a minor accident or injury can be enough to break weak bones. The bones will also be slower to heal. Osteoporosis can cause other problems as well, such as a stooped posture or trouble with movement. Osteoporosis can occur with aging. As you get older, you may lose bone tissue more quickly, or it may be replaced more slowly. Osteoporosis is more likely to develop if you have poor nutrition or do not get enough calcium or vitamin D. Other lifestyle factors can also play a role. By eating a well-balanced diet and making lifestyle changes, you can help keep your bones strong and healthy, lowering your chances of developing osteoporosis. What can increase my risk? The following factors may make you more likely to develop osteoporosis:  Having a family history of the condition.  Having poor nutrition or not getting enough calcium or vitamin D.  Using certain medicines, such as steroid medicines or anti-seizure medicines.  Being any of the following: ? 50 years of age or older. ? Female. ? A woman who has gone through menopause (is postmenopausal). ? A person who is of European or Asian descent.  Using products that contain nicotine or tobacco, such as cigarettes, e-cigarettes, and chewing tobacco.  Not being physically active (being sedentary).  Having a small body frame. What actions can I take to prevent this? Get enough calcium  Make sure you get enough calcium every day. Calcium  is the most important mineral for bone health. Most people can get enough calcium from their diet, but supplements may be recommended for people who are at risk for osteoporosis. Follow these guidelines: ? If you are age 50 or younger, aim to get 1,000 milligrams (mg) of calcium every day. ? If you are older than age 50, aim to get 1,200 mg of calcium every day.  Good sources of calcium include: ? Dairy products, such as low-fat or nonfat milk, cheese, and yogurt. ? Dark green leafy vegetables, such as bok choy and broccoli. ? Foods that have had calcium added to them (calcium-fortified foods), such as orange juice, cereal, bread, soy beverages, and tofu products. ? Nuts, such as almonds.  Check nutrition labels to see how much calcium is in a food or drink.   Get enough vitamin D  Try to get enough vitamin D every day. Vitamin D is the most essential vitamin for bone health. It helps the body absorb calcium. Follow these guidelines for how much vitamin D to get from food: ? If you are age 70 or younger, aim to get at least 600 international units (IU) every day. Your health care provider may suggest more. ? If you are older than age 70, aim to get at least 800 international units every day. Your health care provider may suggest more.  Good sources of vitamin D in your diet include: ? Egg yolks. ? Oily fish, such as salmon, sardines, and tuna. ? Milk and cereal fortified with vitamin D.  Your body also makes vitamin   D when you are out in the sun. Exposing the bare skin on your face, arms, legs, or back to the sun for no more than 30 minutes a day, 2 times a week is more than enough. Beyond that, make sure you use sunblock to protect your skin from sunburn, which increases your risk for skin cancer. Exercise  Stay active and get exercise every day.  Ask your health care provider what types of exercise are best for you. Weight-bearing and strength-building activities are important for  building and maintaining healthy bones. Some examples of these types of activities include: ? Walking and hiking. ? Jogging and running. ? Dancing. ? Gym exercises and lifting weights. ? Tennis and racquetball. ? Climbing stairs. ? Tai chi.   Make other lifestyle changes  Do not use any products that contain nicotine or tobacco, such as cigarettes, e-cigarettes, and chewing tobacco. If you need help quitting, ask your health care provider.  Lose weight if you are overweight.  If you drink alcohol: ? Limit how much you use to:  0-1 drink a day for women who are not pregnant.  0-2 drinks a day for men. ? Be aware of how much alcohol is in your drink. In the [place] one drink equals one 12 oz bottle of beer (355 mL), one 5 oz glass of wine (148 mL), or one 1 oz glass of hard liquor (44 mL). Where to find support If you need help making changes to prevent osteoporosis, talk with your health care provider. You can ask for a referral to a dietitian and a physical therapist. Where to find more information Learn more about osteoporosis from:  NIH Osteoporosis and Related [place]: www.bones.SouthExposed.es  [place] Office on [employer] Health: [place].tn  [place]: EquipmentWeekly.com.ee Summary  Osteoporosis is a condition that causes weak bones that are more likely to break.  Eat a healthy diet, making sure you get enough calcium and vitamin D, and stay active by getting regular exercise to help prevent osteoporosis.  Other ways to reduce your risk of osteoporosis include maintaining a healthy weight and avoiding alcohol and products that contain nicotine or tobacco. This information is not intended to replace advice given to you by your health care provider. Make sure you discuss any questions you have with your health care provider. Document Revised: 11/02/2019 Document Reviewed: 11/02/2019 Elsevier Patient Education  [place].

## 2020-08-16 NOTE — Progress Notes (Signed)
Subjective:     [name] Ramos is a 55 y.o. female here for a routine exam.  Current complaints: Pt reports that she remains on Depo Provera because when she stops it her mood goes out of control. She reports that she understands the risks to bone health but, doesn't to want to stop it. She does not want to take an EES products and does not want to take an antidepressant. She has her parents with her 6 months out of the year  And it is a stressful time. She is >2 years tobacco free!!!   Gynecologic History No LMP recorded (lmp unknown). Patient has had an injection. Contraception: post menopausal status Last Pap: 07/18/2018. Results were: normal Last mammogram: 01/10/2020. Results were: normal  Obstetric History OB History  Gravida Para Term Preterm AB Living  3       3    SAB IAB Ectopic Multiple Live Births    3     0    # Outcome Date GA Lbr Len/2nd Weight Sex Delivery Anes PTL Lv  3 IAB           2 IAB           1 IAB              The following portions of the patient's history were reviewed and updated as appropriate: allergies, current medications, past family history, past medical history, past social history, past surgical history and problem list.  Review of Systems Pertinent items are noted in HPI.    Objective:  BP (!) 147/76 (BP Location: Right Arm, Patient Position: Sitting, Cuff Size: Large)   Pulse 63   Ht 5\' 5"  (1.651 m)   Wt 221 lb (100.2 kg)   LMP  (LMP Unknown)   BMI 36.78 kg/m  General Appearance:    Alert, cooperative, no distress, appears stated age  Head:    Normocephalic, without obvious abnormality, atraumatic  Eyes:    conjunctiva/corneas clear, EOM's intact, both eyes  Ears:    Normal external ear canals, both ears  Nose:   Nares normal, septum midline, mucosa normal, no drainage    or sinus tenderness  Throat:   Lips, mucosa, and tongue normal; teeth and gums normal  Neck:   Supple, symmetrical, trachea midline, no adenopathy;    thyroid:  no  enlargement/tenderness/nodules  Back:     Symmetric, no curvature, ROM normal, no CVA tenderness  Lungs:     respirations unlabored  Chest Wall:    No tenderness or deformity   Heart:    Regular rate and rhythm  Breast Exam:    No tenderness, masses, or nipple abnormality  Abdomen:     Soft, non-tender, bowel sounds active all four quadrants,    no masses, no organomegaly  Genitalia:    Normal female without lesion, discharge or tenderness   There are some hypopigmented areas that are chronic and consistent with her dx of lichen sclerosis.   Extremities:   Extremities normal, atraumatic, no cyanosis or edema  Pulses:   2+ and symmetric all extremities  Skin:   Skin color, texture, turgor normal, no rashes or lesions    Assessment:    Healthy female exam.   Menopausal state Depo Provera - I spent a great deal of tie discussing options with pt. She is not inclined to swithc at this time. She has agreed to add VIt D and Ca++ to her daily meds. I have explained to her that this  should not be based solely on her levels. She expressed understanding.     Plan:     [name] Ramos was seen today for gynecologic exam.  Diagnoses and all orders for this visit:  Well female exam with routine gynecological exam -     Cytology - PAP  Vit D >600Iu daily plus Ca++ 1200 mg daily.  F/u in 1 year or sooner prn Keep clobetasol 2-3 x/week  Eber Jones L. Harraway-Smith, M.D., [place]

## 2020-08-16 NOTE — Progress Notes (Signed)
Pt presents today for yearly exam. Last pap 07/2018 WNL. Mammogram up to date. Next one due in 12/2020. Colonoscopy also up to date. Next one due 10/2021. Pt has no other issues or complaints today.

## 2020-08-20 LAB — CYTOLOGY - PAP
Comment: NEGATIVE
High risk HPV: NEGATIVE

## 2020-10-08 DIAGNOSIS — L820 Inflamed seborrheic keratosis: Secondary | ICD-10-CM | POA: Diagnosis not present

## 2020-10-08 DIAGNOSIS — L821 Other seborrheic keratosis: Secondary | ICD-10-CM | POA: Diagnosis not present

## 2020-10-16 ENCOUNTER — Other Ambulatory Visit: Payer: Federal, State, Local not specified - PPO

## 2020-10-30 DIAGNOSIS — M5412 Radiculopathy, cervical region: Secondary | ICD-10-CM | POA: Diagnosis not present

## 2020-12-07 NOTE — Progress Notes (Signed)
Cardiology Office Note:    Date:  12/09/2020   ID:  [name] Ramos, DOB Jan 26, 1966, MRN [id number]  PCP:  [name] Held, DO  Cardiologist:  [name] Grooms, MD   Referring MD: [name] Ramos, [name] Ramos, *   Chief Complaint  Patient presents with   Follow-up    Left bundle branch block Family history CAD Atypical chest pain     History of Present Illness:    [name] Ramos is a 55 y.o. female with a hx of,premature ventricular contractions, left bundle branch block, family history of coronary artery disease, and atypical chest pain.   [name] Ramos is doing well.  She has no cardiac complaints.  She has gained some weight.  She is not as physically active as she once was.  She occasionally has shoulder discomfort with numbness in her hands.  This lasts seconds to less than a minute.  It seems to be precipitated at times by walking.  There is no associated shortness of breath or other ischemic complaint.  She denies orthopnea, PND, palpitations.    Past Medical History:  Diagnosis Date   Alcohol abuse    recovering alcoholic   Allergic urticaria    Anxiety    Cholecystitis, acute    Congenital anomaly of neck    Depression    Dyslipidemia    Followed by PCP   Gastroparesis    GERD (gastroesophageal reflux disease)    Hematuria    Hiatal hernia    with reflux   Hypertension    stress test 11/2010 normal   Kidney stone    Obesity    PVC's (premature ventricular contractions)    Controlled on beta blocker therapy;  Echo 9/10: EF 92-11%, grade 1 diastolic dysfunction, mild MR, mild LAE   Thyroid nodule    hx   Tinea corporis    Unspecified contraceptive management    Urinary tract infection    Vaginal Pap smear, abnormal     Past Surgical History:  Procedure Laterality Date   leep surgery     mid 20's   TONSILLECTOMY      Current Medications: Current Meds  Medication Sig   ALPRAZolam (XANAX) 0.25 MG tablet Take 1 tablet (0.25 mg total) by mouth 3 (three)  times daily as needed for anxiety.   amLODipine (NORVASC) 5 MG tablet Take 1 tablet (5 mg total) by mouth daily.   aspirin 81 MG tablet Take 81 mg by mouth daily.   atenolol (TENORMIN) 100 MG tablet Take 1 tablet (100 mg total) by mouth daily.   atorvastatin (LIPITOR) 10 MG tablet Take 1 tablet (10 mg total) by mouth daily.   blood glucose meter kit and supplies Dispense based on patient and insurance preference. Use up to four times daily as directed. (FOR ICD-10 E10.9, E11.9).   clobetasol ointment (TEMOVATE) 0.05 % Apply to affected area every night for 4 weeks, then every other day for 4 weeks and then twice a week   EPINEPHrine 0.3 mg/0.3 mL IJ SOAJ injection Inject 0.3 mg into the muscle as needed for anaphylaxis.   Ergocalciferol (VITAMIN D2 PO) Take 1,000 mg by mouth daily.   gabapentin (NEURONTIN) 300 MG capsule Take 300 mg by mouth as needed.   hydrochlorothiazide (HYDRODIURIL) 25 MG tablet Take 1 tablet (25 mg total) by mouth daily.   methocarbamol (ROBAXIN) 500 MG tablet Take 1 tablet (500 mg total) by mouth every 6 (six) hours as needed for muscle spasms.   omeprazole (PRILOSEC)  40 MG capsule TAKE 1 CAPSULE BY MOUTH EVERY DAY     Allergies:   Ace inhibitors, Bee venom, Lisinopril, and Piroxicam   Social History   Socioeconomic History   Marital status: Single    Spouse name: Not on file   Number of children: Not on file   Years of education: Not on file   Highest education level: Not on file  Occupational History   Occupation: post office    Employer: [employer]  Tobacco Use   Smoking status: Former    Pack years: 0.00    Types: Cigarettes    Quit date: 06/16/2018    Years since quitting: 2.4   Smokeless tobacco: Never  Vaping Use   Vaping Use: Never used  Substance and Sexual Activity   Alcohol use: No    Alcohol/week: 0.0 standard drinks   Drug use: No   Sexual activity: Not Currently    Partners: Male  Other Topics Concern   Not on file  Social History  Narrative   Exercising---walking dog daily   Social Determinants of Health   Financial Resource Strain: Not on file  Food Insecurity: Not on file  Transportation Needs: Not on file  Physical Activity: Not on file  Stress: Not on file  Social Connections: Not on file     Family History: The patient's family history includes Breast cancer (age of onset: 79) in her mother; Colon polyps in her mother; Coronary artery disease in an other family member; Diabetes in her maternal grandmother and mother; Heart attack (age of onset: 73) in her maternal grandmother; Hyperlipidemia in her father; Hypertension in her father, mother, and another family member; Irritable bowel syndrome in her mother; Liver disease in her mother; Lung cancer in her maternal grandmother, maternal uncle, and paternal grandmother; Thyroid disease in her mother. There is no history of Colon cancer or Stomach cancer.  ROS:   Please see the history of present illness.    Weight gain but otherwise no complaints.  Works at [employer] and is having no limitations.  All other systems reviewed and are negative.  EKGs/Labs/Other Studies Reviewed:    The following studies were reviewed today: No new data  EKG:  EKG sinus rhythm, left bundle branch block with QRS duration 110 ms, biatrial abnormality, leftward axis, and when compared to 5 of/09/2019, the QRS duration has increased.  Recent Labs: 06/28/2020: ALT 24; BUN 14; Creatinine, Ser 0.83; Hemoglobin 13.9; Platelets 262.0; Potassium 3.8; Sodium 139; TSH 0.84  Recent Lipid Panel    Component Value Date/Time   CHOL 151 06/28/2020 0919   TRIG 92.0 06/28/2020 0919   HDL 50.80 06/28/2020 0919   CHOLHDL 3 06/28/2020 0919   VLDL 18.4 06/28/2020 0919   LDLCALC 82 06/28/2020 0919   LDLDIRECT 148.1 01/20/2008 0912    Physical Exam:    VS:  BP 122/70   Pulse 61   Ht _0  (1.651 m)   Wt 217 lb 6.4 oz (98.6 kg)   SpO2 98%   BMI 36.18 kg/m     Wt Readings from  Last 3 Encounters:  12/09/20 217 lb 6.4 oz (98.6 kg)  08/16/20 221 lb (100.2 kg)  07/19/20 217 lb 3.2 oz (98.5 kg)     GEN: Morbid obesity. No acute distress HEENT: Normal NECK: No JVD. LYMPHATICS: No lymphadenopathy CARDIAC: 2/6 right upper sternal systolic murmur. RRR no gallop, or edema. VASCULAR:  Normal Pulses. No bruits. RESPIRATORY:  Clear to auscultation without rales,  wheezing or rhonchi  ABDOMEN: Soft, non-tender, non-distended, No pulsatile mass, MUSCULOSKELETAL: No deformity  SKIN: Warm and dry NEUROLOGIC:  Alert and oriented x 3 PSYCHIATRIC:  Normal affect   ASSESSMENT:    1. Left bundle branch block   2. Essential hypertension   3. Hyperlipidemia with target LDL less than 100   4. Tobacco use    PLAN:    In order of problems listed above:  She voices no complaints.  She has back today for routine follow-up.  Has remote history of left bundle which resolved on EKGs from 2020 and 2021.  Today's EKG does demonstrate recurrence of the left bundle branch block appearance.  PR interval is 164 ms.  We will check an echo to make sure LV function is intact. Blood pressure control is excellent.  Continue current therapy which includes Norvasc 5 mg/day atenolol 100 mg daily HCTZ 25 mg/day. Continue Lipitor 10 mg/day.  Most recent LDL was 82 in January.  Given risk factors, target should be less than 70.  Consider intensifying statin dose.  If echo does not demonstrate any decline in LV function, we will simply follow the patient along clinically.   Medication Adjustments/Labs and Tests Ordered: Current medicines are reviewed at length with the patient today.  Concerns regarding medicines are outlined above.  Orders Placed This Encounter  Procedures   EKG 12-Lead   ECHOCARDIOGRAM COMPLETE   No orders of the defined types were placed in this encounter.   Patient Instructions  Medication Instructions:  Your physician recommends that you continue on your current  medications as directed. Please refer to the Current Medication list given to you today.  *If you need a refill on your cardiac medications before your next appointment, please call your pharmacy*   Lab Work: None If you have labs (blood work) drawn today and your tests are completely normal, you will receive your results only by: [place] (if you have MyChart) OR A paper copy in the mail If you have any lab test that is abnormal or we need to change your treatment, we will call you to review the results.   Testing/Procedures: Your physician has requested that you have an echocardiogram. Echocardiography is a painless test that uses sound waves to create images of your heart. It provides your doctor with information about the size and shape of your heart and how well your heart's chambers and valves are working. This procedure takes approximately one hour. There are no restrictions for this procedure.   Follow-Up: At [facility], you and your health needs are our priority.  As part of our continuing mission to provide you with exceptional heart care, we have created designated Provider Care Teams.  These Care Teams include your primary Cardiologist (physician) and Advanced Practice Providers (APPs -  Physician Assistants and Nurse Practitioners) who all work together to provide you with the care you need, when you need it.  We recommend signing up for the patient portal called "MyChart".  Sign up information is provided on this After Visit Summary.  MyChart is used to connect with patients for Virtual Visits (Telemedicine).  Patients are able to view lab/test results, encounter notes, upcoming appointments, etc.  Non-urgent messages can be sent to your provider as well.   To learn more about what you can do with MyChart, go to [email or web address].    Your next appointment:   1 year(s)  The format for your next appointment:   In Person  Provider:  You may see [name] Grooms, MD or one of the following Advanced Practice Providers on your designated Care Team:   Cecilie Kicks, NP    Other Instructions     Signed, [name] Grooms, MD  12/09/2020 6:20 PM    [place]

## 2020-12-09 ENCOUNTER — Ambulatory Visit: Payer: Federal, State, Local not specified - PPO | Admitting: Interventional Cardiology

## 2020-12-09 ENCOUNTER — Encounter: Payer: Self-pay | Admitting: Interventional Cardiology

## 2020-12-09 ENCOUNTER — Other Ambulatory Visit: Payer: Self-pay

## 2020-12-09 VITALS — BP 122/70 | HR 61 | Ht 65.0 in | Wt 217.4 lb

## 2020-12-09 DIAGNOSIS — E785 Hyperlipidemia, unspecified: Secondary | ICD-10-CM | POA: Diagnosis not present

## 2020-12-09 DIAGNOSIS — I447 Left bundle-branch block, unspecified: Secondary | ICD-10-CM

## 2020-12-09 DIAGNOSIS — I10 Essential (primary) hypertension: Secondary | ICD-10-CM

## 2020-12-09 DIAGNOSIS — Z720 Tobacco use: Secondary | ICD-10-CM

## 2020-12-09 NOTE — Patient Instructions (Signed)
Medication Instructions:  Your physician recommends that you continue on your current medications as directed. Please refer to the Current Medication list given to you today.  *If you need a refill on your cardiac medications before your next appointment, please call your pharmacy*   Lab Work: None If you have labs (blood work) drawn today and your tests are completely normal, you will receive your results only by: MyChart Message (if you have MyChart) OR A paper copy in the mail If you have any lab test that is abnormal or we need to change your treatment, we will call you to review the results.   Testing/Procedures: Your physician has requested that you have an echocardiogram. Echocardiography is a painless test that uses sound waves to create images of your heart. It provides your doctor with information about the size and shape of your heart and how well your heart's chambers and valves are working. This procedure takes approximately one hour. There are no restrictions for this procedure.   Follow-Up: At [facility], you and your health needs are our priority.  As part of our continuing mission to provide you with exceptional heart care, we have created designated Provider Care Teams.  These Care Teams include your primary Cardiologist (physician) and Advanced Practice Providers (APPs -  Physician Assistants and Nurse Practitioners) who all work together to provide you with the care you need, when you need it.  We recommend signing up for the patient portal called "MyChart".  Sign up information is provided on this After Visit Summary.  MyChart is used to connect with patients for Virtual Visits (Telemedicine).  Patients are able to view lab/test results, encounter notes, upcoming appointments, etc.  Non-urgent messages can be sent to your provider as well.   To learn more about what you can do with MyChart, go to [email or web address].    Your next appointment:   1 year(s)  The  format for your next appointment:   In Person  Provider:   You may see Henry W Smith III, MD or one of the following Advanced Practice Providers on your designated Care Team:   Laura Ingold, NP    Other Instructions   

## 2020-12-11 ENCOUNTER — Telehealth: Payer: Self-pay | Admitting: Interventional Cardiology

## 2020-12-11 NOTE — Telephone Encounter (Signed)
[place] Message:     Pt is having an Echo on Monday. Shr has a Post Nasal Drip, she wants to know if the Prednisone she is on will effect her Echo? She will take the last Prednisone on Monday.

## 2020-12-11 NOTE — Telephone Encounter (Signed)
Spoke with pt and made her aware ok to take Prednisone prior to echo.  Advised we would still be able to proceed with test.  Pt appreciative for call.

## 2020-12-16 ENCOUNTER — Ambulatory Visit (HOSPITAL_COMMUNITY): Payer: Federal, State, Local not specified - PPO | Attending: Cardiovascular Disease

## 2020-12-16 ENCOUNTER — Other Ambulatory Visit: Payer: Self-pay

## 2020-12-16 DIAGNOSIS — I447 Left bundle-branch block, unspecified: Secondary | ICD-10-CM

## 2020-12-16 DIAGNOSIS — I10 Essential (primary) hypertension: Secondary | ICD-10-CM | POA: Insufficient documentation

## 2020-12-16 DIAGNOSIS — Z87891 Personal history of nicotine dependence: Secondary | ICD-10-CM | POA: Insufficient documentation

## 2020-12-16 DIAGNOSIS — E785 Hyperlipidemia, unspecified: Secondary | ICD-10-CM | POA: Diagnosis not present

## 2020-12-16 DIAGNOSIS — E669 Obesity, unspecified: Secondary | ICD-10-CM | POA: Insufficient documentation

## 2020-12-16 DIAGNOSIS — I340 Nonrheumatic mitral (valve) insufficiency: Secondary | ICD-10-CM | POA: Insufficient documentation

## 2020-12-16 DIAGNOSIS — E119 Type 2 diabetes mellitus without complications: Secondary | ICD-10-CM | POA: Insufficient documentation

## 2020-12-16 LAB — ECHOCARDIOGRAM COMPLETE
Area-P 1/2: 3 cm2
S' Lateral: 3.5 cm

## 2020-12-26 ENCOUNTER — Encounter: Payer: Self-pay | Admitting: Family Medicine

## 2020-12-26 ENCOUNTER — Ambulatory Visit: Payer: Federal, State, Local not specified - PPO | Admitting: Family Medicine

## 2020-12-26 ENCOUNTER — Other Ambulatory Visit: Payer: Self-pay

## 2020-12-26 VITALS — BP 124/80 | HR 62 | Temp 98.3°F | Resp 18 | Ht 65.0 in | Wt 219.8 lb

## 2020-12-26 DIAGNOSIS — I739 Peripheral vascular disease, unspecified: Secondary | ICD-10-CM

## 2020-12-26 DIAGNOSIS — Z1159 Encounter for screening for other viral diseases: Secondary | ICD-10-CM | POA: Diagnosis not present

## 2020-12-26 DIAGNOSIS — I447 Left bundle-branch block, unspecified: Secondary | ICD-10-CM

## 2020-12-26 DIAGNOSIS — F418 Other specified anxiety disorders: Secondary | ICD-10-CM

## 2020-12-26 DIAGNOSIS — R739 Hyperglycemia, unspecified: Secondary | ICD-10-CM

## 2020-12-26 DIAGNOSIS — E785 Hyperlipidemia, unspecified: Secondary | ICD-10-CM

## 2020-12-26 DIAGNOSIS — E1169 Type 2 diabetes mellitus with other specified complication: Secondary | ICD-10-CM

## 2020-12-26 DIAGNOSIS — F320 Major depressive disorder, single episode, mild: Secondary | ICD-10-CM

## 2020-12-26 DIAGNOSIS — E119 Type 2 diabetes mellitus without complications: Secondary | ICD-10-CM

## 2020-12-26 DIAGNOSIS — I10 Essential (primary) hypertension: Secondary | ICD-10-CM | POA: Diagnosis not present

## 2020-12-26 LAB — COMPREHENSIVE METABOLIC PANEL
ALT: 27 U/L (ref 0–35)
AST: 17 U/L (ref 0–37)
Albumin: 4.1 g/dL (ref 3.5–5.2)
Alkaline Phosphatase: 58 U/L (ref 39–117)
BUN: 10 mg/dL (ref 6–23)
CO2: 27 mEq/L (ref 19–32)
Calcium: 9.6 mg/dL (ref 8.4–10.5)
Chloride: 103 mEq/L (ref 96–112)
Creatinine, Ser: 0.87 mg/dL (ref 0.40–1.20)
GFR: 76.19 mL/min (ref >60.00)
Glucose, Bld: 139 mg/dL — ABNORMAL HIGH (ref 70–99)
Potassium: 4 mEq/L (ref 3.5–5.1)
Sodium: 138 mEq/L (ref 135–145)
Total Bilirubin: 0.8 mg/dL (ref 0.2–1.2)
Total Protein: 6.7 g/dL (ref 6.0–8.3)

## 2020-12-26 LAB — LIPID PANEL
Cholesterol: 153 mg/dL (ref 0–200)
HDL: 49.7 mg/dL (ref >39.00)
LDL Cholesterol: 82 mg/dL (ref 0–99)
NonHDL: 103.74
Total CHOL/HDL Ratio: 3
Triglycerides: 109 mg/dL (ref 0.0–149.0)
VLDL: 21.8 mg/dL (ref 0.0–40.0)

## 2020-12-26 LAB — HEMOGLOBIN A1C: Hgb A1c MFr Bld: 7 % — ABNORMAL HIGH (ref 4.6–6.5)

## 2020-12-26 NOTE — Assessment & Plan Note (Signed)
Encourage heart healthy diet such as MIND or DASH diet, increase exercise, avoid trans fats, simple carbohydrates and processed foods, consider a krill or fish or flaxseed oil cap daily.  °

## 2020-12-26 NOTE — Progress Notes (Signed)
Subjective:   By signing my name below, I, [name], attest that this documentation has been prepared under the direction and in the presence of Dr. Roma Schanz, DO. 12/26/2020    Patient ID: [name] Ramos, female    DOB: 11/22/1965, 55 y.o.   MRN: [id number]  Chief Complaint  Patient presents with   Hypertension   Hyperlipidemia   Follow-up    HPI Patient is in today for a office visit. She reports that her right bundle blockage has returned. She has seen her cardiologist and had an EKG completed and results were normal.  She continues having a epi-pen on stand-by in case her allergy symptoms flare up. She was informed of a healthy weight and wellness program during her last visit and decided that it would not work with her schedule.  She has 3 Covid-19 vaccines and is willing to get the 2nd booster vaccine at a later time.  She continues monitoring her sugars at home and reports her last measurement was 127. She mentions that she is not going to control her sugar intake at this time.   Lab Results  Component Value Date   HGBA1C 6.4 06/28/2020   Blood pressure is doing well during this visit. She continues taking 100 mg atenolol daily PO, 5 mg amlodipine daily PO, and 25 mg hydrochlorothiazide daily PO and reports no new issues while taking them.   BP Readings from Last 3 Encounters:  12/26/20 124/80  12/09/20 122/70  08/16/20 (!) 147/76    Past Medical History:  Diagnosis Date   Alcohol abuse    recovering alcoholic   Allergic urticaria    Anxiety    Cholecystitis, acute    Congenital anomaly of neck    Depression    Dyslipidemia    Followed by PCP   Gastroparesis    GERD (gastroesophageal reflux disease)    Hematuria    Hiatal hernia    with reflux   Hypertension    stress test 11/2010 normal   Kidney stone    Obesity    PVC's (premature ventricular contractions)    Controlled on beta blocker therapy;  Echo 9/10: EF 18-40%, grade 1 diastolic  dysfunction, mild MR, mild LAE   Thyroid nodule    hx   Tinea corporis    Unspecified contraceptive management    Urinary tract infection    Vaginal Pap smear, abnormal     Past Surgical History:  Procedure Laterality Date   leep surgery     mid 20's   TONSILLECTOMY      Family History  Problem Relation Age of Onset   Diabetes Mother    Liver disease Mother    Irritable bowel syndrome Mother    Colon polyps Mother    Thyroid disease Mother    Hypertension Mother    Breast cancer Mother 76   Hyperlipidemia Father    Hypertension Father    Lung cancer Maternal Uncle    Lung cancer Maternal Grandmother    Heart attack Maternal Grandmother 48   Diabetes Maternal Grandmother    Lung cancer Paternal Grandmother    Coronary artery disease Other        female 1st degree relative 53 yo   Hypertension Other    Colon cancer Neg Hx    Stomach cancer Neg Hx     Social History   Socioeconomic History   Marital status: Single    Spouse name: Not on file   Number of children: Not  on file   Years of education: Not on file   Highest education level: Not on file  Occupational History   Occupation: post office    Employer: [employer]  Tobacco Use   Smoking status: Former    Types: Cigarettes    Quit date: 06/16/2018    Years since quitting: 2.5   Smokeless tobacco: Never  Vaping Use   Vaping Use: Never used  Substance and Sexual Activity   Alcohol use: No    Alcohol/week: 0.0 standard drinks   Drug use: No   Sexual activity: Not Currently    Partners: Male  Other Topics Concern   Not on file  Social History Narrative   Exercising---walking dog daily   Social Determinants of Health   Financial Resource Strain: Not on file  Food Insecurity: Not on file  Transportation Needs: Not on file  Physical Activity: Not on file  Stress: Not on file  Social Connections: Not on file  Intimate Partner Violence: Not on file    Outpatient Medications Prior to Visit   Medication Sig Dispense Refill   ALPRAZolam (XANAX) 0.25 MG tablet Take 1 tablet (0.25 mg total) by mouth 3 (three) times daily as needed for anxiety. 30 tablet 2   amLODipine (NORVASC) 5 MG tablet Take 1 tablet (5 mg total) by mouth daily. 90 tablet 3   aspirin 81 MG tablet Take 81 mg by mouth daily.     atenolol (TENORMIN) 100 MG tablet Take 1 tablet (100 mg total) by mouth daily. 90 tablet 3   atorvastatin (LIPITOR) 10 MG tablet Take 1 tablet (10 mg total) by mouth daily. 90 tablet 3   blood glucose meter kit and supplies Dispense based on patient and insurance preference. Use up to four times daily as directed. (FOR ICD-10 E10.9, E11.9). 1 each 0   clobetasol ointment (TEMOVATE) 0.05 % Apply to affected area every night for 4 weeks, then every other day for 4 weeks and then twice a week 60 g 5   EPINEPHrine 0.3 mg/0.3 mL IJ SOAJ injection Inject 0.3 mg into the muscle as needed for anaphylaxis. 2 each 1   Ergocalciferol (VITAMIN D2 PO) Take 1,000 mg by mouth daily.     gabapentin (NEURONTIN) 300 MG capsule Take 300 mg by mouth as needed.     hydrochlorothiazide (HYDRODIURIL) 25 MG tablet Take 1 tablet (25 mg total) by mouth daily. 90 tablet 3   methocarbamol (ROBAXIN) 500 MG tablet Take 1 tablet (500 mg total) by mouth every 6 (six) hours as needed for muscle spasms. 30 tablet 1   omeprazole (PRILOSEC) 40 MG capsule TAKE 1 CAPSULE BY MOUTH EVERY DAY 90 capsule 3   medroxyPROGESTERone Acetate 150 MG/ML SUSY INJECT 1 ML (150 MG TOTAL) AS DIRECTED EVERY 3 (THREE) MONTHS FOR 1 DAY. 1 mL 2   No facility-administered medications prior to visit.    Allergies  Allergen Reactions   Ace Inhibitors Other (See Comments)    Numbness, tingling of lips.   Bee Venom Other (See Comments)    Leg swelling   Lisinopril     Other reaction(s): Other (See Comments) other   Piroxicam Other (See Comments)    REACTION: hives REACTION: hives    ROS     Objective:    Physical Exam Constitutional:       General: She is not in acute distress.    Appearance: Normal appearance. She is not ill-appearing.  HENT:     Head: Normocephalic and  atraumatic.     Right Ear: External ear normal.     Left Ear: External ear normal.  Eyes:     Extraocular Movements: Extraocular movements intact.     Pupils: Pupils are equal, round, and reactive to light.  Cardiovascular:     Rate and Rhythm: Normal rate and regular rhythm.     Pulses: Normal pulses.     Heart sounds: Normal heart sounds. No murmur heard.   No gallop.  Pulmonary:     Effort: Pulmonary effort is normal. No respiratory distress.     Breath sounds: Normal breath sounds. No wheezing or rales.  Skin:    General: Skin is warm and dry.  Neurological:     Mental Status: She is alert and oriented to person, place, and time.  Psychiatric:        Behavior: Behavior normal.    BP 124/80 (BP Location: Right Arm, Patient Position: Sitting, Cuff Size: Large)   Pulse 62   Temp 98.3 F (36.8 C) (Oral)   Resp 18   Ht 5' 5"  (1.651 m)   Wt 219 lb 12.8 oz (99.7 kg)   SpO2 99%   BMI 36.58 kg/m  Wt Readings from Last 3 Encounters:  12/26/20 219 lb 12.8 oz (99.7 kg)  12/09/20 217 lb 6.4 oz (98.6 kg)  08/16/20 221 lb (100.2 kg)    Diabetic Foot Exam - Simple   Simple Foot Form Diabetic Foot exam was performed with the following findings: Yes 12/26/2020 11:41 AM  Visual Inspection No deformities, no ulcerations, no other skin breakdown bilaterally: Yes Sensation Testing Intact to touch and monofilament testing bilaterally: Yes Pulse Check Posterior Tibialis and Dorsalis pulse intact bilaterally: Yes Comments    Lab Results  Component Value Date   WBC 7.2 06/28/2020   HGB 13.9 06/28/2020   HCT 41.9 06/28/2020   PLT 262.0 06/28/2020   GLUCOSE 117 (H) 06/28/2020   CHOL 151 06/28/2020   TRIG 92.0 06/28/2020   HDL 50.80 06/28/2020   LDLDIRECT 148.1 01/20/2008   LDLCALC 82 06/28/2020   ALT 24 06/28/2020   AST 17 06/28/2020   NA  139 06/28/2020   K 3.8 06/28/2020   CL 103 06/28/2020   CREATININE 0.83 06/28/2020   BUN 14 06/28/2020   CO2 28 06/28/2020   TSH 0.84 06/28/2020   HGBA1C 6.4 06/28/2020   MICROALBUR <0.7 06/28/2020    Lab Results  Component Value Date   TSH 0.84 06/28/2020   Lab Results  Component Value Date   WBC 7.2 06/28/2020   HGB 13.9 06/28/2020   HCT 41.9 06/28/2020   MCV 92.1 06/28/2020   PLT 262.0 06/28/2020   Lab Results  Component Value Date   NA 139 06/28/2020   K 3.8 06/28/2020   CO2 28 06/28/2020   GLUCOSE 117 (H) 06/28/2020   BUN 14 06/28/2020   CREATININE 0.83 06/28/2020   BILITOT 0.6 06/28/2020   ALKPHOS 64 06/28/2020   AST 17 06/28/2020   ALT 24 06/28/2020   PROT 6.8 06/28/2020   ALBUMIN 4.2 06/28/2020   CALCIUM 9.6 06/28/2020   ANIONGAP 8 08/30/2015   GFR 80.89 06/28/2020   Lab Results  Component Value Date   CHOL 151 06/28/2020   Lab Results  Component Value Date   HDL 50.80 06/28/2020   Lab Results  Component Value Date   LDLCALC 82 06/28/2020   Lab Results  Component Value Date   TRIG 92.0 06/28/2020   Lab Results  Component Value Date  CHOLHDL 3 06/28/2020   Lab Results  Component Value Date   HGBA1C 6.4 06/28/2020       Assessment & Plan:   Problem List Items Addressed This Visit       Unprioritized   Primary hypertension - Primary   Relevant Orders   Insulin, random   Depression, major, single episode, mild (HCC)   Diet-controlled diabetes mellitus ([place])    Check labs  con't diet        Hyperglycemia   Relevant Orders   Comprehensive metabolic panel   Hemoglobin A1c   Insulin, random   Hyperlipidemia   Relevant Orders   Lipid panel   Comprehensive metabolic panel   Insulin, random   Hyperlipidemia associated with type 2 diabetes mellitus ([place])    Encourage heart healthy diet such as MIND or DASH diet, increase exercise, avoid trans fats, simple carbohydrates and processed foods, consider a krill or fish or flaxseed  oil cap daily.        Left bundle branch block    Per cardiology Echo normal       Morbid obesity ([place])   PAD (peripheral artery disease) ([place])   Other Visit Diagnoses     Need for hepatitis C screening test       Relevant Orders   Hepatitis C antibody        No orders of the defined types were placed in this encounter.   I, Dr. Roma Schanz, DO, personally preformed the services described in this documentation.  All medical record entries made by the scribe were at my direction and in my presence.  I have reviewed the chart and discharge instructions (if applicable) and agree that the record reflects my personal performance and is accurate and complete. 12/26/2020   I,[name],acting as a scribe for [employer], DO.,have documented all relevant documentation on the behalf of Ann Held, DO,as directed by  Ann Held, DO while in the presence of Ann Held, DO.   Ann Held, DO

## 2020-12-26 NOTE — Assessment & Plan Note (Signed)
Per cardiology Echo normal

## 2020-12-26 NOTE — Assessment & Plan Note (Signed)
Stable con't meds 

## 2020-12-26 NOTE — Assessment & Plan Note (Signed)
Check labs  con't diet 

## 2020-12-26 NOTE — Patient Instructions (Signed)
https://www.nhlbi.nih.gov/files/docs/public/heart/dash_brief.pdf">  DASH Eating Plan DASH stands for Dietary Approaches to Stop Hypertension. The DASH eating plan is a healthy eating plan that has been shown to: Reduce high blood pressure (hypertension). Reduce your risk for type 2 diabetes, heart disease, and stroke. Help with weight loss. What are tips for following this plan? Reading food labels Check food labels for the amount of salt (sodium) per serving. Choose foods with less than 5 percent of the Daily Value of sodium. Generally, foods with less than 300 milligrams (mg) of sodium per serving fit into this eating plan. To find whole grains, look for the word "whole" as the first word in the ingredient list. Shopping Buy products labeled as "low-sodium" or "no salt added." Buy fresh foods. Avoid canned foods and pre-made or frozen meals. Cooking Avoid adding salt when cooking. Use salt-free seasonings or herbs instead of table salt or sea salt. Check with your health care provider or pharmacist before using salt substitutes. Do not fry foods. Cook foods using healthy methods such as baking, boiling, grilling, roasting, and broiling instead. Cook with heart-healthy oils, such as olive, canola, avocado, soybean, or sunflower oil. Meal planning  Eat a balanced diet that includes: 4 or more servings of fruits and 4 or more servings of vegetables each day. Try to fill one-half of your plate with fruits and vegetables. 6-8 servings of whole grains each day. Less than 6 oz (170 g) of lean meat, poultry, or fish each day. A 3-oz (85-g) serving of meat is about the same size as a deck of cards. One egg equals 1 oz (28 g). 2-3 servings of low-fat dairy each day. One serving is 1 cup (237 mL). 1 serving of nuts, seeds, or beans 5 times each week. 2-3 servings of heart-healthy fats. Healthy fats called omega-3 fatty acids are found in foods such as walnuts, flaxseeds, fortified milks, and eggs.  These fats are also found in cold-water fish, such as sardines, salmon, and mackerel. Limit how much you eat of: Canned or prepackaged foods. Food that is high in trans fat, such as some fried foods. Food that is high in saturated fat, such as fatty meat. Desserts and other sweets, sugary drinks, and other foods with added sugar. Full-fat dairy products. Do not salt foods before eating. Do not eat more than 4 egg yolks a week. Try to eat at least 2 vegetarian meals a week. Eat more home-cooked food and less restaurant, buffet, and fast food.  Lifestyle When eating at a restaurant, ask that your food be prepared with less salt or no salt, if possible. If you drink alcohol: Limit how much you use to: 0-1 drink a day for women who are not pregnant. 0-2 drinks a day for men. Be aware of how much alcohol is in your drink. In the [place] one drink equals one 12 oz bottle of beer (355 mL), one 5 oz glass of wine (148 mL), or one 1 oz glass of hard liquor (44 mL). General information Avoid eating more than 2,300 mg of salt a day. If you have hypertension, you may need to reduce your sodium intake to 1,500 mg a day. Work with your health care provider to maintain a healthy body weight or to lose weight. Ask what an ideal weight is for you. Get at least 30 minutes of exercise that causes your heart to beat faster (aerobic exercise) most days of the week. Activities may include walking, swimming, or biking. Work with your health care provider   or dietitian to adjust your eating plan to your individual calorie needs. What foods should I eat? Fruits All fresh, dried, or frozen fruit. Canned fruit in natural juice (without addedsugar). Vegetables Fresh or frozen vegetables (raw, steamed, roasted, or grilled). Low-sodium or reduced-sodium tomato and vegetable juice. Low-sodium or reduced-sodium tomatosauce and tomato paste. Low-sodium or reduced-sodium canned vegetables. Grains Whole-grain or  whole-wheat bread. Whole-grain or whole-wheat pasta. Brown rice. Oatmeal. Quinoa. Bulgur. Whole-grain and low-sodium cereals. Pita bread.Low-fat, low-sodium crackers. Whole-wheat flour tortillas. Meats and other proteins Skinless chicken or turkey. Ground chicken or turkey. Pork with fat trimmed off. Fish and seafood. Egg whites. Dried beans, peas, or lentils. Unsalted nuts, nut butters, and seeds. Unsalted canned beans. Lean cuts of beef with fat trimmed off. Low-sodium, lean precooked or cured meat, such as sausages or meatloaves. Dairy Low-fat (1%) or fat-free (skim) milk. Reduced-fat, low-fat, or fat-free cheeses. Nonfat, low-sodium ricotta or cottage cheese. Low-fat or nonfatyogurt. Low-fat, low-sodium cheese. Fats and oils Soft margarine without trans fats. Vegetable oil. Reduced-fat, low-fat, or light mayonnaise and salad dressings (reduced-sodium). Canola, safflower, olive, avocado, soybean, andsunflower oils. Avocado. Seasonings and condiments Herbs. Spices. Seasoning mixes without salt. Other foods Unsalted popcorn and pretzels. Fat-free sweets. The items listed above may not be a complete list of foods and beverages you can eat. Contact a dietitian for more information. What foods should I avoid? Fruits Canned fruit in a light or heavy syrup. Fried fruit. Fruit in cream or buttersauce. Vegetables Creamed or fried vegetables. Vegetables in a cheese sauce. Regular canned vegetables (not low-sodium or reduced-sodium). Regular canned tomato sauce and paste (not low-sodium or reduced-sodium). Regular tomato and vegetable juice(not low-sodium or reduced-sodium). Pickles. Olives. Grains Baked goods made with fat, such as croissants, muffins, or some breads. Drypasta or rice meal packs. Meats and other proteins Fatty cuts of meat. Ribs. Fried meat. Bacon. Bologna, salami, and other precooked or cured meats, such as sausages or meat loaves. Fat from the back of a pig (fatback). Bratwurst.  Salted nuts and seeds. Canned beans with added salt. Canned orsmoked fish. Whole eggs or egg yolks. Chicken or turkey with skin. Dairy Whole or 2% milk, cream, and half-and-half. Whole or full-fat cream cheese. Whole-fat or sweetened yogurt. Full-fat cheese. Nondairy creamers. Whippedtoppings. Processed cheese and cheese spreads. Fats and oils Butter. Stick margarine. Lard. Shortening. Ghee. Bacon fat. Tropical oils, suchas coconut, palm kernel, or palm oil. Seasonings and condiments Onion salt, garlic salt, seasoned salt, table salt, and sea salt. Worcestershire sauce. Tartar sauce. Barbecue sauce. Teriyaki sauce. Soy sauce, including reduced-sodium. Steak sauce. Canned and packaged gravies. Fish sauce. Oyster sauce. Cocktail sauce. Store-bought horseradish. Ketchup. Mustard. Meat flavorings and tenderizers. Bouillon cubes. Hot sauces. Pre-made or packaged marinades. Pre-made or packaged taco seasonings. Relishes. Regular saladdressings. Other foods Salted popcorn and pretzels. The items listed above may not be a complete list of foods and beverages you should avoid. Contact a dietitian for more information. Where to find more information National Heart, Lung, and Blood Institute: www.nhlbi.nih.gov American Heart Association: www.heart.org Academy of Nutrition and Dietetics: www.eatright.org National Kidney Foundation: www.kidney.org Summary The DASH eating plan is a healthy eating plan that has been shown to reduce high blood pressure (hypertension). It may also reduce your risk for type 2 diabetes, heart disease, and stroke. When on the DASH eating plan, aim to eat more fresh fruits and vegetables, whole grains, lean proteins, low-fat dairy, and heart-healthy fats. With the DASH eating plan, you should limit salt (sodium) intake to 2,300   mg a day. If you have hypertension, you may need to reduce your sodium intake to 1,500 mg a day. Work with your health care provider or dietitian to adjust  your eating plan to your individual calorie needs. This information is not intended to replace advice given to you by your health care provider. Make sure you discuss any questions you have with your healthcare provider. Document Revised: 04/21/2019 Document Reviewed: 04/21/2019 Elsevier Patient Education  2022 Elsevier Inc.  

## 2020-12-27 LAB — INSULIN, RANDOM: Insulin: 27.1 u[IU]/mL — ABNORMAL HIGH

## 2020-12-27 LAB — HEPATITIS C ANTIBODY
Hepatitis C Ab: NONREACTIVE
SIGNAL TO CUT-OFF: 0 (ref <1.00)

## 2020-12-30 ENCOUNTER — Other Ambulatory Visit: Payer: Self-pay

## 2020-12-30 ENCOUNTER — Other Ambulatory Visit: Payer: Self-pay | Admitting: Family Medicine

## 2020-12-30 DIAGNOSIS — E1165 Type 2 diabetes mellitus with hyperglycemia: Secondary | ICD-10-CM

## 2020-12-30 DIAGNOSIS — R739 Hyperglycemia, unspecified: Secondary | ICD-10-CM

## 2020-12-30 DIAGNOSIS — E785 Hyperlipidemia, unspecified: Secondary | ICD-10-CM

## 2020-12-30 DIAGNOSIS — Z1231 Encounter for screening mammogram for malignant neoplasm of breast: Secondary | ICD-10-CM

## 2020-12-30 DIAGNOSIS — E1169 Type 2 diabetes mellitus with other specified complication: Secondary | ICD-10-CM

## 2020-12-30 MED ORDER — METFORMIN HCL 500 MG PO TABS: 500.0000 mg | ORAL_TABLET | Freq: Every day | ORAL | 2 refills | Status: DC

## 2021-01-09 ENCOUNTER — Encounter: Payer: Self-pay | Admitting: Family Medicine

## 2021-01-10 ENCOUNTER — Other Ambulatory Visit: Payer: Self-pay

## 2021-01-10 ENCOUNTER — Encounter: Payer: Self-pay | Admitting: Family Medicine

## 2021-01-10 ENCOUNTER — Telehealth: Payer: Federal, State, Local not specified - PPO | Admitting: Family Medicine

## 2021-01-10 DIAGNOSIS — U071 COVID-19: Secondary | ICD-10-CM

## 2021-01-10 DIAGNOSIS — J208 Acute bronchitis due to other specified organisms: Secondary | ICD-10-CM | POA: Diagnosis not present

## 2021-01-10 MED ORDER — ALBUTEROL SULFATE HFA 108 (90 BASE) MCG/ACT IN AERS
2.0000 | INHALATION_SPRAY | Freq: Four times a day (QID) | RESPIRATORY_TRACT | 0 refills | Status: DC | PRN
Start: 2021-01-10 — End: 2022-06-05

## 2021-01-10 MED ORDER — MOLNUPIRAVIR EUA 200MG CAPSULE: 4.0000 | ORAL_CAPSULE | Freq: Two times a day (BID) | ORAL | 0 refills | Status: AC

## 2021-01-10 MED ORDER — AZITHROMYCIN 250 MG PO TABS: ORAL_TABLET | ORAL | 0 refills | Status: DC

## 2021-01-10 MED ORDER — PROMETHAZINE-DM 6.25-15 MG/5ML PO SYRP: 5.0000 mL | ORAL_SOLUTION | Freq: Four times a day (QID) | ORAL | 0 refills | Status: DC | PRN

## 2021-01-10 NOTE — Progress Notes (Addendum)
MyChart Video Visit    Virtual Visit via Video Note   This visit type was conducted due to national recommendations for restrictions regarding the COVID-19 Pandemic (e.g. social distancing) in an effort to limit this patient's exposure and mitigate transmission in our community. This patient is at least at moderate risk for complications without adequate follow up. This format is felt to be most appropriate for this patient at this time. Physical exam was limited by quality of the video and audio technology used for the visit. Kem Boroughs was able to get the patient set up on a video visit.  Patient location: Home Patient and provider in visit Provider location: Office  I discussed the limitations of evaluation and management by telemedicine and the availability of in person appointments. The patient expressed understanding and agreed to proceed.  Visit Date: 01/10/2021  Today's healthcare provider: Ann Held, DO      Subjective:    Patient ID: [name] Ramos, female    DOB: 22-Oct-1965, 55 y.o.   MRN: [id number]  Chief Complaint  Patient presents with   Covid Positive    HPI Patient is in today for a video visit.  She is Covid-19 positive.  She went to [place] last weekend. She was feeling sick before she traveled but thought it was her allergies. The symptoms started on Tuesday night. She denies fever but has chills, body aches, congestion, cough and wheezing.  Past Medical History:  Diagnosis Date   Alcohol abuse    recovering alcoholic   Allergic urticaria    Anxiety    Cholecystitis, acute    Congenital anomaly of neck    Depression    Dyslipidemia    Followed by PCP   Gastroparesis    GERD (gastroesophageal reflux disease)    Hematuria    Hiatal hernia    with reflux   Hypertension    stress test 11/2010 normal   Kidney stone    Obesity    PVC's (premature ventricular contractions)    Controlled on beta blocker therapy;  Echo 9/10:  EF 60-63%, grade 1 diastolic dysfunction, mild MR, mild LAE   Thyroid nodule    hx   Tinea corporis    Unspecified contraceptive management    Urinary tract infection    Vaginal Pap smear, abnormal     Past Surgical History:  Procedure Laterality Date   leep surgery     mid 20's   TONSILLECTOMY      Family History  Problem Relation Age of Onset   Diabetes Mother    Liver disease Mother    Irritable bowel syndrome Mother    Colon polyps Mother    Thyroid disease Mother    Hypertension Mother    Breast cancer Mother 66   Hyperlipidemia Father    Hypertension Father    Lung cancer Maternal Uncle    Lung cancer Maternal Grandmother    Heart attack Maternal Grandmother 58   Diabetes Maternal Grandmother    Lung cancer Paternal Grandmother    Coronary artery disease Other        female 1st degree relative 53 yo   Hypertension Other    Colon cancer Neg Hx    Stomach cancer Neg Hx     Social History   Socioeconomic History   Marital status: Single    Spouse name: Not on file   Number of children: Not on file   Years of education: Not on file  Highest education level: Not on file  Occupational History   Occupation: post office    Employer: [employer]  Tobacco Use   Smoking status: Former    Types: Cigarettes    Quit date: 06/16/2018    Years since quitting: 2.5   Smokeless tobacco: Never  Vaping Use   Vaping Use: Never used  Substance and Sexual Activity   Alcohol use: No    Alcohol/week: 0.0 standard drinks   Drug use: No   Sexual activity: Not Currently    Partners: Male  Other Topics Concern   Not on file  Social History Narrative   Exercising---walking dog daily   Social Determinants of Health   Financial Resource Strain: Not on file  Food Insecurity: Not on file  Transportation Needs: Not on file  Physical Activity: Not on file  Stress: Not on file  Social Connections: Not on file  Intimate Partner Violence: Not on file    Outpatient  Medications Prior to Visit  Medication Sig Dispense Refill   ALPRAZolam (XANAX) 0.25 MG tablet Take 1 tablet (0.25 mg total) by mouth 3 (three) times daily as needed for anxiety. 30 tablet 2   amLODipine (NORVASC) 5 MG tablet Take 1 tablet (5 mg total) by mouth daily. 90 tablet 3   aspirin 81 MG tablet Take 81 mg by mouth daily.     atenolol (TENORMIN) 100 MG tablet Take 1 tablet (100 mg total) by mouth daily. 90 tablet 3   atorvastatin (LIPITOR) 10 MG tablet Take 1 tablet (10 mg total) by mouth daily. 90 tablet 3   blood glucose meter kit and supplies Dispense based on patient and insurance preference. Use up to four times daily as directed. (FOR ICD-10 E10.9, E11.9). 1 each 0   clobetasol ointment (TEMOVATE) 0.05 % Apply to affected area every night for 4 weeks, then every other day for 4 weeks and then twice a week 60 g 5   EPINEPHrine 0.3 mg/0.3 mL IJ SOAJ injection Inject 0.3 mg into the muscle as needed for anaphylaxis. 2 each 1   Ergocalciferol (VITAMIN D2 PO) Take 1,000 mg by mouth daily.     gabapentin (NEURONTIN) 300 MG capsule Take 300 mg by mouth as needed.     hydrochlorothiazide (HYDRODIURIL) 25 MG tablet Take 1 tablet (25 mg total) by mouth daily. 90 tablet 3   metFORMIN (GLUCOPHAGE) 500 MG tablet Take 1 tablet (500 mg total) by mouth daily with breakfast. 30 tablet 2   methocarbamol (ROBAXIN) 500 MG tablet Take 1 tablet (500 mg total) by mouth every 6 (six) hours as needed for muscle spasms. 30 tablet 1   omeprazole (PRILOSEC) 40 MG capsule TAKE 1 CAPSULE BY MOUTH EVERY DAY 90 capsule 3   medroxyPROGESTERone Acetate 150 MG/ML SUSY INJECT 1 ML (150 MG TOTAL) AS DIRECTED EVERY 3 (THREE) MONTHS FOR 1 DAY. 1 mL 2   No facility-administered medications prior to visit.    Allergies  Allergen Reactions   Ace Inhibitors Other (See Comments)    Numbness, tingling of lips.   Bee Venom Other (See Comments)    Leg swelling   Lisinopril     Other reaction(s): Other (See  Comments) other   Piroxicam Other (See Comments)    REACTION: hives REACTION: hives    Review of Systems  Constitutional:  Positive for chills. Negative for fever and malaise/fatigue.  HENT:  Positive for congestion. Negative for hearing loss.   Eyes:  Negative for discharge.  Respiratory:  Positive for cough and wheezing. Negative for sputum production and shortness of breath.   Cardiovascular:  Negative for chest pain, palpitations and leg swelling.  Gastrointestinal:  Negative for abdominal pain, blood in stool, constipation, diarrhea, heartburn, nausea and vomiting.  Genitourinary:  Negative for dysuria, frequency, hematuria and urgency.  Musculoskeletal:  Positive for myalgias (body). Negative for back pain and falls.  Skin:  Negative for rash.  Neurological:  Negative for dizziness, sensory change, loss of consciousness, weakness and headaches.  Endo/Heme/Allergies:  Negative for environmental allergies. Does not bruise/bleed easily.  Psychiatric/Behavioral:  Negative for depression and suicidal ideas. The patient is not nervous/anxious and does not have insomnia.       Objective:    Physical Exam Vitals and nursing note reviewed.  Pulmonary:     Effort: Pulmonary effort is normal.  Psychiatric:        Mood and Affect: Mood normal.        Behavior: Behavior normal.        Thought Content: Thought content normal.        Judgment: Judgment normal.   There were no vitals taken for this visit. Wt Readings from Last 3 Encounters:  12/26/20 219 lb 12.8 oz (99.7 kg)  12/09/20 217 lb 6.4 oz (98.6 kg)  08/16/20 221 lb (100.2 kg)    Diabetic Foot Exam - Simple   No data filed    Lab Results  Component Value Date   WBC 7.2 06/28/2020   HGB 13.9 06/28/2020   HCT 41.9 06/28/2020   PLT 262.0 06/28/2020   GLUCOSE 139 (H) 12/26/2020   CHOL 153 12/26/2020   TRIG 109.0 12/26/2020   HDL 49.70 12/26/2020   LDLDIRECT 148.1 01/20/2008   LDLCALC 82 12/26/2020   ALT 27  12/26/2020   AST 17 12/26/2020   NA 138 12/26/2020   K 4.0 12/26/2020   CL 103 12/26/2020   CREATININE 0.87 12/26/2020   BUN 10 12/26/2020   CO2 27 12/26/2020   TSH 0.84 06/28/2020   HGBA1C 7.0 (H) 12/26/2020   MICROALBUR <0.7 06/28/2020    Lab Results  Component Value Date   TSH 0.84 06/28/2020   Lab Results  Component Value Date   WBC 7.2 06/28/2020   HGB 13.9 06/28/2020   HCT 41.9 06/28/2020   MCV 92.1 06/28/2020   PLT 262.0 06/28/2020   Lab Results  Component Value Date   NA 138 12/26/2020   K 4.0 12/26/2020   CO2 27 12/26/2020   GLUCOSE 139 (H) 12/26/2020   BUN 10 12/26/2020   CREATININE 0.87 12/26/2020   BILITOT 0.8 12/26/2020   ALKPHOS 58 12/26/2020   AST 17 12/26/2020   ALT 27 12/26/2020   PROT 6.7 12/26/2020   ALBUMIN 4.1 12/26/2020   CALCIUM 9.6 12/26/2020   ANIONGAP 8 08/30/2015   GFR 76.19 12/26/2020   Lab Results  Component Value Date   CHOL 153 12/26/2020   Lab Results  Component Value Date   HDL 49.70 12/26/2020   Lab Results  Component Value Date   LDLCALC 82 12/26/2020   Lab Results  Component Value Date   TRIG 109.0 12/26/2020   Lab Results  Component Value Date   CHOLHDL 3 12/26/2020   Lab Results  Component Value Date   HGBA1C 7.0 (H) 12/26/2020       Assessment & Plan:   Problem List Items Addressed This Visit   None Visit Diagnoses     Acute bronchitis due to COVID-19 virus    -  Primary   Relevant Medications   molnupiravir EUA 200 mg CAPS   albuterol (VENTOLIN HFA) 108 (90 Base) MCG/ACT inhaler   azithromycin (ZITHROMAX Z-PAK) 250 MG tablet   promethazine-dextromethorphan (PROMETHAZINE-DM) 6.25-15 MG/5ML syrup       Meds ordered this encounter  Medications   molnupiravir EUA 200 mg CAPS    Sig: Take 4 capsules (800 mg total) by mouth 2 (two) times daily for 5 days.    Dispense:  40 capsule    Refill:  0   albuterol (VENTOLIN HFA) 108 (90 Base) MCG/ACT inhaler    Sig: Inhale 2 puffs into the lungs every  6 (six) hours as needed for wheezing or shortness of breath.    Dispense:  8 g    Refill:  0   azithromycin (ZITHROMAX Z-PAK) 250 MG tablet    Sig: As directed    Dispense:  6 each    Refill:  0   promethazine-dextromethorphan (PROMETHAZINE-DM) 6.25-15 MG/5ML syrup    Sig: Take 5 mLs by mouth 4 (four) times daily as needed.    Dispense:  118 mL    Refill:  0    I discussed the assessment and treatment plan with the patient. The patient was provided an opportunity to ask questions and all were answered. The patient agreed with the plan and demonstrated an understanding of the instructions.   The patient was advised to call back or seek an in-person evaluation if the symptoms worsen or if the condition fails to improve as anticipated.  I provided 20 minutes of face-to-face time during this encounter.   I,Zite Okoli,acting as a Education administrator for [employer], DO.,have documented all relevant documentation on the behalf of Ann Held, DO,as directed by  Ann Held, DO while in the presence of [place], DO, have reviewed all documentation for this visit. The documentation on 01/13/21 for the exam, diagnosis, procedures, and orders are all accurate and complete.   Ann Held, DO [place] [facility] [phone] (phone) [phone] (fax)  [place]

## 2021-01-16 ENCOUNTER — Ambulatory Visit: Payer: Federal, State, Local not specified - PPO

## 2021-01-22 ENCOUNTER — Ambulatory Visit (INDEPENDENT_AMBULATORY_CARE_PROVIDER_SITE_OTHER): Payer: Federal, State, Local not specified - PPO

## 2021-01-22 ENCOUNTER — Other Ambulatory Visit: Payer: Self-pay

## 2021-01-22 ENCOUNTER — Encounter: Payer: Self-pay | Admitting: Family Medicine

## 2021-01-22 DIAGNOSIS — Z1231 Encounter for screening mammogram for malignant neoplasm of breast: Secondary | ICD-10-CM

## 2021-01-23 ENCOUNTER — Other Ambulatory Visit: Payer: Self-pay | Admitting: Family Medicine

## 2021-01-23 DIAGNOSIS — E041 Nontoxic single thyroid nodule: Secondary | ICD-10-CM

## 2021-01-23 MED ORDER — MELOXICAM 7.5 MG PO TABS: 7.5000 mg | ORAL_TABLET | Freq: Every day | ORAL | 0 refills | Status: DC

## 2021-01-23 NOTE — Telephone Encounter (Signed)
Rx was cancelled 

## 2021-01-28 ENCOUNTER — Ambulatory Visit (INDEPENDENT_AMBULATORY_CARE_PROVIDER_SITE_OTHER): Payer: Federal, State, Local not specified - PPO

## 2021-01-28 ENCOUNTER — Other Ambulatory Visit: Payer: Self-pay

## 2021-01-28 DIAGNOSIS — E041 Nontoxic single thyroid nodule: Secondary | ICD-10-CM | POA: Diagnosis not present

## 2021-02-06 ENCOUNTER — Ambulatory Visit (INDEPENDENT_AMBULATORY_CARE_PROVIDER_SITE_OTHER): Payer: Federal, State, Local not specified - PPO

## 2021-02-06 ENCOUNTER — Encounter: Payer: Self-pay | Admitting: Family Medicine

## 2021-02-06 ENCOUNTER — Encounter (HOSPITAL_BASED_OUTPATIENT_CLINIC_OR_DEPARTMENT_OTHER): Payer: Self-pay

## 2021-02-06 ENCOUNTER — Other Ambulatory Visit: Payer: Self-pay

## 2021-02-06 ENCOUNTER — Ambulatory Visit: Payer: Federal, State, Local not specified - PPO | Admitting: Family Medicine

## 2021-02-06 ENCOUNTER — Ambulatory Visit (HOSPITAL_BASED_OUTPATIENT_CLINIC_OR_DEPARTMENT_OTHER)
Admission: RE | Admit: 2021-02-06 | Discharge: 2021-02-06 | Disposition: A | Discharge status: Home / Self Care | Payer: Federal, State, Local not specified - PPO | Source: Ambulatory Visit | Attending: Family Medicine | Admitting: Family Medicine

## 2021-02-06 VITALS — BP 108/68 | HR 60 | Temp 98.6°F | Resp 18 | Ht 65.0 in | Wt 220.2 lb

## 2021-02-06 DIAGNOSIS — R0781 Pleurodynia: Secondary | ICD-10-CM

## 2021-02-06 DIAGNOSIS — U099 Post covid-19 condition, unspecified: Secondary | ICD-10-CM

## 2021-02-06 DIAGNOSIS — R053 Chronic cough: Secondary | ICD-10-CM

## 2021-02-06 DIAGNOSIS — E1165 Type 2 diabetes mellitus with hyperglycemia: Secondary | ICD-10-CM | POA: Insufficient documentation

## 2021-02-06 DIAGNOSIS — M419 Scoliosis, unspecified: Secondary | ICD-10-CM | POA: Diagnosis not present

## 2021-02-06 DIAGNOSIS — R739 Hyperglycemia, unspecified: Secondary | ICD-10-CM

## 2021-02-06 DIAGNOSIS — R3129 Other microscopic hematuria: Secondary | ICD-10-CM

## 2021-02-06 DIAGNOSIS — I10 Essential (primary) hypertension: Secondary | ICD-10-CM

## 2021-02-06 LAB — POC URINALSYSI DIPSTICK (AUTOMATED)
Bilirubin, UA: NEGATIVE
Glucose, UA: NEGATIVE
Ketones, UA: NEGATIVE
Leukocytes, UA: NEGATIVE
Nitrite, UA: NEGATIVE
Protein, UA: NEGATIVE
Spec Grav, UA: 1.01 (ref 1.010–1.025)
Urobilinogen, UA: 0.2 E.U./dL
pH, UA: 5 (ref 5.0–8.0)

## 2021-02-06 MED ORDER — METFORMIN HCL 500 MG PO TABS: 500.0000 mg | ORAL_TABLET | Freq: Every day | ORAL | 2 refills | Status: DC

## 2021-02-06 NOTE — Progress Notes (Signed)
Established Patient Office Visit  Subjective:  Patient ID: [name] Ramos, female    DOB: Mar 19, 1966  Age: 55 y.o. MRN: [id number]  CC:  Chief Complaint  Patient presents with   Back Pain    X2 weeks, pt states having back pain when she bends down. Pt states no pain with walking and no injury    HPI [name] presents for R side mid back since having covid --- she describes shooting pain when she gets back up from sitting.   No urinary symptoms   --- + passing gas.  No abd pain, no fever   Past Medical History:  Diagnosis Date   Alcohol abuse    recovering alcoholic   Allergic urticaria    Anxiety    Cholecystitis, acute    Congenital anomaly of neck    Depression    Dyslipidemia    Followed by PCP   Gastroparesis    GERD (gastroesophageal reflux disease)    Hematuria    Hiatal hernia    with reflux   Hypertension    stress test 11/2010 normal   Kidney stone    Obesity    PVC's (premature ventricular contractions)    Controlled on beta blocker therapy;  Echo 9/10: EF 05-18%, grade 1 diastolic dysfunction, mild MR, mild LAE   Thyroid nodule    hx   Tinea corporis    Unspecified contraceptive management    Urinary tract infection    Vaginal Pap smear, abnormal     Past Surgical History:  Procedure Laterality Date   leep surgery     mid 20's   TONSILLECTOMY      Family History  Problem Relation Age of Onset   Diabetes Mother    Liver disease Mother    Irritable bowel syndrome Mother    Colon polyps Mother    Thyroid disease Mother    Hypertension Mother    Breast cancer Mother 75   Hyperlipidemia Father    Hypertension Father    Lung cancer Maternal Uncle    Lung cancer Maternal Grandmother    Heart attack Maternal Grandmother 48   Diabetes Maternal Grandmother    Lung cancer Paternal Grandmother    Coronary artery disease Other        female 1st degree relative 53 yo   Hypertension Other    Colon cancer Neg Hx    Stomach cancer Neg Hx      Social History   Socioeconomic History   Marital status: Single    Spouse name: Not on file   Number of children: Not on file   Years of education: Not on file   Highest education level: Not on file  Occupational History   Occupation: post office    Employer: [employer]  Tobacco Use   Smoking status: Former    Types: Cigarettes    Quit date: 06/16/2018    Years since quitting: 2.6   Smokeless tobacco: Never  Vaping Use   Vaping Use: Never used  Substance and Sexual Activity   Alcohol use: No    Alcohol/week: 0.0 standard drinks   Drug use: No   Sexual activity: Not Currently    Partners: Male  Other Topics Concern   Not on file  Social History Narrative   Exercising---walking dog daily   Social Determinants of Health   Financial Resource Strain: Not on file  Food Insecurity: Not on file  Transportation Needs: Not on file  Physical Activity: Not on  file  Stress: Not on file  Social Connections: Not on file  Intimate Partner Violence: Not on file    Outpatient Medications Prior to Visit  Medication Sig Dispense Refill   albuterol (VENTOLIN HFA) 108 (90 Base) MCG/ACT inhaler Inhale 2 puffs into the lungs every 6 (six) hours as needed for wheezing or shortness of breath. 8 g 0   ALPRAZolam (XANAX) 0.25 MG tablet Take 1 tablet (0.25 mg total) by mouth 3 (three) times daily as needed for anxiety. 30 tablet 2   amLODipine (NORVASC) 5 MG tablet Take 1 tablet (5 mg total) by mouth daily. 90 tablet 3   aspirin 81 MG tablet Take 81 mg by mouth daily.     atenolol (TENORMIN) 100 MG tablet Take 1 tablet (100 mg total) by mouth daily. 90 tablet 3   atorvastatin (LIPITOR) 10 MG tablet Take 1 tablet (10 mg total) by mouth daily. 90 tablet 3   blood glucose meter kit and supplies Dispense based on patient and insurance preference. Use up to four times daily as directed. (FOR ICD-10 E10.9, E11.9). 1 each 0   clobetasol ointment (TEMOVATE) 0.05 % Apply to affected area every  night for 4 weeks, then every other day for 4 weeks and then twice a week 60 g 5   EPINEPHrine 0.3 mg/0.3 mL IJ SOAJ injection Inject 0.3 mg into the muscle as needed for anaphylaxis. 2 each 1   Ergocalciferol (VITAMIN D2 PO) Take 1,000 mg by mouth daily.     gabapentin (NEURONTIN) 300 MG capsule Take 300 mg by mouth as needed.     hydrochlorothiazide (HYDRODIURIL) 25 MG tablet Take 1 tablet (25 mg total) by mouth daily. 90 tablet 3   methocarbamol (ROBAXIN) 500 MG tablet Take 1 tablet (500 mg total) by mouth every 6 (six) hours as needed for muscle spasms. 30 tablet 1   omeprazole (PRILOSEC) 40 MG capsule TAKE 1 CAPSULE BY MOUTH EVERY DAY 90 capsule 3   promethazine-dextromethorphan (PROMETHAZINE-DM) 6.25-15 MG/5ML syrup Take 5 mLs by mouth 4 (four) times daily as needed. 118 mL 0   metFORMIN (GLUCOPHAGE) 500 MG tablet Take 1 tablet (500 mg total) by mouth daily with breakfast. 30 tablet 2   medroxyPROGESTERone Acetate 150 MG/ML SUSY INJECT 1 ML (150 MG TOTAL) AS DIRECTED EVERY 3 (THREE) MONTHS FOR 1 DAY. 1 mL 2   azithromycin (ZITHROMAX Z-PAK) 250 MG tablet As directed (Patient not taking: Reported on 02/06/2021) 6 each 0   meloxicam (MOBIC) 7.5 MG tablet Take 1 tablet (7.5 mg total) by mouth daily. (Patient not taking: Reported on 02/06/2021) 30 tablet 0   No facility-administered medications prior to visit.    Allergies  Allergen Reactions   Ace Inhibitors Other (See Comments)    Numbness, tingling of lips.   Bee Venom Other (See Comments)    Leg swelling   Lisinopril     Other reaction(s): Other (See Comments) other   Piroxicam Other (See Comments)    REACTION: hives REACTION: hives    ROS Review of Systems  Constitutional:  Negative for appetite change, diaphoresis, fatigue and unexpected weight change.  Eyes:  Negative for pain, redness and visual disturbance.  Respiratory:  Negative for cough, chest tightness, shortness of breath and wheezing.   Cardiovascular:  Negative for  chest pain, palpitations and leg swelling.  Endocrine: Negative for cold intolerance, heat intolerance, polydipsia, polyphagia and polyuria.  Genitourinary:  Positive for flank pain. Negative for difficulty urinating, dysuria, frequency, pelvic pain and urgency.  Musculoskeletal:  Positive for back pain. Negative for gait problem.  Neurological:  Negative for dizziness, light-headedness, numbness and headaches.     Objective:    Physical Exam Vitals and nursing note reviewed.  Constitutional:      Appearance: She is well-developed.  HENT:     Head: Normocephalic and atraumatic.  Eyes:     Conjunctiva/sclera: Conjunctivae normal.  Neck:     Thyroid: No thyromegaly.     Vascular: No carotid bruit or JVD.  Cardiovascular:     Rate and Rhythm: Normal rate and regular rhythm.     Heart sounds: Normal heart sounds. No murmur heard. Pulmonary:     Effort: Pulmonary effort is normal. No respiratory distress.     Breath sounds: Normal breath sounds. No wheezing or rales.  Chest:     Chest wall: No tenderness.  Musculoskeletal:        General: Tenderness present. No swelling, deformity or signs of injury.     Cervical back: Normal range of motion and neck supple.     Thoracic back: Spasms and tenderness present. Normal range of motion.     Right lower leg: No edema.     Left lower leg: No edema.     Comments: R flank tenderness since covid Full ROM   Neurological:     Mental Status: She is alert and oriented to person, place, and time.   BP 108/68 (BP Location: Right Arm, Patient Position: Sitting, Cuff Size: Large)   Pulse 60   Temp 98.6 F (37 C) (Oral)   Resp 18   Ht 5' 5"  (1.651 m)   Wt 220 lb 3.2 oz (99.9 kg)   SpO2 99%   BMI 36.64 kg/m  Wt Readings from Last 3 Encounters:  02/06/21 220 lb 3.2 oz (99.9 kg)  12/26/20 219 lb 12.8 oz (99.7 kg)  12/09/20 217 lb 6.4 oz (98.6 kg)     Health Maintenance Due  Topic Date Due   PNEUMOCOCCAL POLYSACCHARIDE VACCINE AGE 55-64  HIGH RISK  Never done   Pneumococcal Vaccine 7-27 Years old (1 - PCV) Never done   COVID-19 Vaccine (4 - Booster for Pfizer series) 06/14/2020   INFLUENZA VACCINE  12/30/2020    There are no preventive care reminders to display for this patient.  Lab Results  Component Value Date   TSH 0.84 06/28/2020   Lab Results  Component Value Date   WBC 7.2 06/28/2020   HGB 13.9 06/28/2020   HCT 41.9 06/28/2020   MCV 92.1 06/28/2020   PLT 262.0 06/28/2020   Lab Results  Component Value Date   NA 138 12/26/2020   K 4.0 12/26/2020   CO2 27 12/26/2020   GLUCOSE 139 (H) 12/26/2020   BUN 10 12/26/2020   CREATININE 0.87 12/26/2020   BILITOT 0.8 12/26/2020   ALKPHOS 58 12/26/2020   AST 17 12/26/2020   ALT 27 12/26/2020   PROT 6.7 12/26/2020   ALBUMIN 4.1 12/26/2020   CALCIUM 9.6 12/26/2020   ANIONGAP 8 08/30/2015   GFR 76.19 12/26/2020   Lab Results  Component Value Date   CHOL 153 12/26/2020   Lab Results  Component Value Date   HDL 49.70 12/26/2020   Lab Results  Component Value Date   LDLCALC 82 12/26/2020   Lab Results  Component Value Date   TRIG 109.0 12/26/2020   Lab Results  Component Value Date   CHOLHDL 3 12/26/2020   Lab Results  Component Value Date   HGBA1C 7.0 (  H) 12/26/2020      Assessment & Plan:   Problem List Items Addressed This Visit       Unprioritized   Hyperglycemia   Relevant Medications   metFORMIN (GLUCOPHAGE) 500 MG tablet   Post-COVID chronic cough   Relevant Orders   DG Ribs Unilateral W/Chest Right   Primary hypertension    Well controlled, no changes to meds. Encouraged heart healthy diet such as the DASH diet and exercise as tolerated.       Rib pain - Primary    Check xray        Relevant Orders   DG Ribs Unilateral W/Chest Right   POCT Urinalysis Dipstick (Automated) (Completed)   Uncontrolled type 2 diabetes mellitus with hyperglycemia (HCC)   Relevant Medications   metFORMIN (GLUCOPHAGE) 500 MG tablet    Other Visit Diagnoses     Microscopic hematuria       Relevant Orders   Urine Culture       Meds ordered this encounter  Medications   metFORMIN (GLUCOPHAGE) 500 MG tablet    Sig: Take 1 tablet (500 mg total) by mouth daily with breakfast.    Dispense:  60 tablet    Refill:  2    Follow-up: Return in about 2 months (around 04/08/2021).    Ann Held, DO

## 2021-02-06 NOTE — Assessment & Plan Note (Signed)
Check xray 

## 2021-02-06 NOTE — Patient Instructions (Signed)

## 2021-02-06 NOTE — Assessment & Plan Note (Signed)
Well controlled, no changes to meds. Encouraged heart healthy diet such as the DASH diet and exercise as tolerated.  °

## 2021-02-07 LAB — URINE CULTURE
MICRO NUMBER:: 12347478
SPECIMEN QUALITY:: ADEQUATE

## 2021-02-08 ENCOUNTER — Other Ambulatory Visit: Payer: Self-pay | Admitting: Family Medicine

## 2021-02-08 DIAGNOSIS — Z3009 Encounter for other general counseling and advice on contraception: Secondary | ICD-10-CM

## 2021-02-10 ENCOUNTER — Encounter: Payer: Self-pay | Admitting: Family Medicine

## 2021-02-20 ENCOUNTER — Other Ambulatory Visit: Payer: Self-pay | Admitting: Family Medicine

## 2021-02-20 DIAGNOSIS — R739 Hyperglycemia, unspecified: Secondary | ICD-10-CM

## 2021-02-20 DIAGNOSIS — E1165 Type 2 diabetes mellitus with hyperglycemia: Secondary | ICD-10-CM

## 2021-02-20 MED ORDER — METFORMIN HCL 500 MG PO TABS: 500.0000 mg | ORAL_TABLET | Freq: Two times a day (BID) | ORAL | 1 refills | Status: DC

## 2021-02-20 NOTE — Telephone Encounter (Signed)
Last visit 02/06/21 - script written for 1 po daily with breakfast , I dont see anything for BID , please advise and I will send in another script

## 2021-04-01 ENCOUNTER — Other Ambulatory Visit: Payer: Federal, State, Local not specified - PPO

## 2021-05-01 ENCOUNTER — Other Ambulatory Visit: Payer: Federal, State, Local not specified - PPO

## 2021-05-07 DIAGNOSIS — H60392 Other infective otitis externa, left ear: Secondary | ICD-10-CM | POA: Diagnosis not present

## 2021-06-18 ENCOUNTER — Encounter: Payer: Self-pay | Admitting: General Practice

## 2021-06-25 DIAGNOSIS — J00 Acute nasopharyngitis [common cold]: Secondary | ICD-10-CM | POA: Diagnosis not present

## 2021-06-25 DIAGNOSIS — R051 Acute cough: Secondary | ICD-10-CM | POA: Diagnosis not present

## 2021-07-24 ENCOUNTER — Encounter: Payer: Self-pay | Admitting: Family Medicine

## 2021-07-24 ENCOUNTER — Ambulatory Visit (INDEPENDENT_AMBULATORY_CARE_PROVIDER_SITE_OTHER): Payer: Federal, State, Local not specified - PPO | Admitting: Family Medicine

## 2021-07-24 VITALS — BP 124/86 | HR 75 | Temp 98.4°F | Resp 18 | Ht 65.0 in | Wt 226.0 lb

## 2021-07-24 DIAGNOSIS — F411 Generalized anxiety disorder: Secondary | ICD-10-CM | POA: Diagnosis not present

## 2021-07-24 DIAGNOSIS — E119 Type 2 diabetes mellitus without complications: Secondary | ICD-10-CM

## 2021-07-24 DIAGNOSIS — T782XXA Anaphylactic shock, unspecified, initial encounter: Secondary | ICD-10-CM

## 2021-07-24 DIAGNOSIS — R739 Hyperglycemia, unspecified: Secondary | ICD-10-CM

## 2021-07-24 DIAGNOSIS — E785 Hyperlipidemia, unspecified: Secondary | ICD-10-CM

## 2021-07-24 DIAGNOSIS — Z0000 Encounter for general adult medical examination without abnormal findings: Secondary | ICD-10-CM

## 2021-07-24 DIAGNOSIS — E559 Vitamin D deficiency, unspecified: Secondary | ICD-10-CM

## 2021-07-24 DIAGNOSIS — E1165 Type 2 diabetes mellitus with hyperglycemia: Secondary | ICD-10-CM

## 2021-07-24 DIAGNOSIS — K219 Gastro-esophageal reflux disease without esophagitis: Secondary | ICD-10-CM

## 2021-07-24 DIAGNOSIS — I10 Essential (primary) hypertension: Secondary | ICD-10-CM

## 2021-07-24 DIAGNOSIS — Z3009 Encounter for other general counseling and advice on contraception: Secondary | ICD-10-CM

## 2021-07-24 DIAGNOSIS — M546 Pain in thoracic spine: Secondary | ICD-10-CM

## 2021-07-24 LAB — LIPID PANEL
Cholesterol: 153 mg/dL (ref 0–200)
HDL: 46.3 mg/dL (ref >39.00)
LDL Cholesterol: 84 mg/dL (ref 0–99)
NonHDL: 106.76
Total CHOL/HDL Ratio: 3
Triglycerides: 115 mg/dL (ref 0.0–149.0)
VLDL: 23 mg/dL (ref 0.0–40.0)

## 2021-07-24 LAB — COMPREHENSIVE METABOLIC PANEL
ALT: 52 U/L — ABNORMAL HIGH (ref 0–35)
AST: 28 U/L (ref 0–37)
Albumin: 4.3 g/dL (ref 3.5–5.2)
Alkaline Phosphatase: 52 U/L (ref 39–117)
BUN: 13 mg/dL (ref 6–23)
CO2: 30 mEq/L (ref 19–32)
Calcium: 9.5 mg/dL (ref 8.4–10.5)
Chloride: 103 mEq/L (ref 96–112)
Creatinine, Ser: 0.82 mg/dL (ref 0.40–1.20)
GFR: 81.46 mL/min (ref >60.00)
Glucose, Bld: 179 mg/dL — ABNORMAL HIGH (ref 70–99)
Potassium: 4 mEq/L (ref 3.5–5.1)
Sodium: 139 mEq/L (ref 135–145)
Total Bilirubin: 0.6 mg/dL (ref 0.2–1.2)
Total Protein: 6.6 g/dL (ref 6.0–8.3)

## 2021-07-24 LAB — CBC WITH DIFFERENTIAL/PLATELET
Basophils Absolute: 0.1 10*3/uL (ref 0.0–0.1)
Basophils Relative: 0.9 % (ref 0.0–3.0)
Eosinophils Absolute: 0.2 10*3/uL (ref 0.0–0.7)
Eosinophils Relative: 2.7 % (ref 0.0–5.0)
HCT: 39.3 % (ref 36.0–46.0)
Hemoglobin: 13.2 g/dL (ref 12.0–15.0)
Lymphocytes Relative: 26.9 % (ref 12.0–46.0)
Lymphs Abs: 1.6 10*3/uL (ref 0.7–4.0)
MCHC: 33.5 g/dL (ref 30.0–36.0)
MCV: 94.4 fl (ref 78.0–100.0)
Monocytes Absolute: 0.5 10*3/uL (ref 0.1–1.0)
Monocytes Relative: 8.5 % (ref 3.0–12.0)
Neutro Abs: 3.6 10*3/uL (ref 1.4–7.7)
Neutrophils Relative %: 61 % (ref 43.0–77.0)
Platelets: 261 10*3/uL (ref 150.0–400.0)
RBC: 4.16 Mil/uL (ref 3.87–5.11)
RDW: 13.8 % (ref 11.5–15.5)
WBC: 5.9 10*3/uL (ref 4.0–10.5)

## 2021-07-24 LAB — VITAMIN D 25 HYDROXY (VIT D DEFICIENCY, FRACTURES): VITD: 48.01 ng/mL (ref 30.00–100.00)

## 2021-07-24 LAB — MICROALBUMIN / CREATININE URINE RATIO
Creatinine,U: 239.7 mg/dL
Microalb Creat Ratio: 1 mg/g (ref 0.0–30.0)
Microalb, Ur: 2.3 mg/dL — ABNORMAL HIGH (ref 0.0–1.9)

## 2021-07-24 LAB — HEMOGLOBIN A1C: Hgb A1c MFr Bld: 7.8 % — ABNORMAL HIGH (ref 4.6–6.5)

## 2021-07-24 LAB — TSH: TSH: 0.82 u[IU]/mL (ref 0.35–5.50)

## 2021-07-24 MED ORDER — AMLODIPINE BESYLATE 5 MG PO TABS: 5.0000 mg | ORAL_TABLET | Freq: Every day | ORAL | 3 refills | Status: DC

## 2021-07-24 MED ORDER — ALPRAZOLAM 0.25 MG PO TABS: 0.2500 mg | ORAL_TABLET | Freq: Three times a day (TID) | ORAL | 2 refills | Status: DC | PRN

## 2021-07-24 MED ORDER — METHOCARBAMOL 500 MG PO TABS: 500.0000 mg | ORAL_TABLET | Freq: Four times a day (QID) | ORAL | 1 refills | Status: DC | PRN

## 2021-07-24 MED ORDER — MEDROXYPROGESTERONE ACETATE 150 MG/ML IM SUSY: 1.0000 mL | PREFILLED_SYRINGE | INTRAMUSCULAR | 2 refills | Status: DC

## 2021-07-24 MED ORDER — HYDROCHLOROTHIAZIDE 25 MG PO TABS: 25.0000 mg | ORAL_TABLET | Freq: Every day | ORAL | 3 refills | Status: DC

## 2021-07-24 MED ORDER — OMEPRAZOLE 40 MG PO CPDR: DELAYED_RELEASE_CAPSULE | ORAL | 3 refills | Status: DC

## 2021-07-24 MED ORDER — METFORMIN HCL 500 MG PO TABS: 500.0000 mg | ORAL_TABLET | Freq: Two times a day (BID) | ORAL | 1 refills | Status: DC

## 2021-07-24 MED ORDER — EPINEPHRINE 0.3 MG/0.3ML IJ SOAJ: 0.3000 mg | INTRAMUSCULAR | 1 refills | Status: DC | PRN

## 2021-07-24 MED ORDER — ATORVASTATIN CALCIUM 10 MG PO TABS: 10.0000 mg | ORAL_TABLET | Freq: Every day | ORAL | 3 refills | Status: DC

## 2021-07-24 MED ORDER — ATENOLOL 100 MG PO TABS: 100.0000 mg | ORAL_TABLET | Freq: Every day | ORAL | 3 refills | Status: DC

## 2021-07-24 NOTE — Patient Instructions (Addendum)
Preventive Care 40-54 Years Old, Female °Preventive care refers to lifestyle choices and visits with your health care provider that can promote health and wellness. Preventive care visits are also called wellness exams. °What can I expect for my preventive care visit? °Counseling °Your health care provider may ask you questions about your: °Medical history, including: °Past medical problems. °Family medical history. °Pregnancy history. °Current health, including: °Menstrual cycle. °Method of birth control. °Emotional well-being. °Home life and relationship well-being. °Sexual activity and sexual health. °Lifestyle, including: °Alcohol, nicotine or tobacco, and drug use. °Access to firearms. °Diet, exercise, and sleep habits. °Work and work environment. °Sunscreen use. °Safety issues such as seatbelt and bike helmet use. °Physical exam °Your health care provider will check your: °Height and weight. These may be used to calculate your BMI (body mass index). BMI is a measurement that tells if you are at a healthy weight. °Waist circumference. This measures the distance around your waistline. This measurement also tells if you are at a healthy weight and may help predict your risk of certain diseases, such as type 2 diabetes and high blood pressure. °Heart rate and blood pressure. °Body temperature. °Skin for abnormal spots. °What immunizations do I need? °Vaccines are usually given at various ages, according to a schedule. Your health care provider will recommend vaccines for you based on your age, medical history, and lifestyle or other factors, such as travel or where you work. °What tests do I need? °Screening °Your health care provider may recommend screening tests for certain conditions. This may include: °Lipid and cholesterol levels. °Diabetes screening. This is done by checking your blood sugar (glucose) after you have not eaten for a while (fasting). °Pelvic exam and Pap test. °Hepatitis B test. °Hepatitis C  test. °HIV (human immunodeficiency virus) test. °STI (sexually transmitted infection) testing, if you are at risk. °Lung cancer screening. °Colorectal cancer screening. °Mammogram. Talk with your health care provider about when you should start having regular mammograms. This may depend on whether you have a family history of breast cancer. °BRCA-related cancer screening. This may be done if you have a family history of breast, ovarian, tubal, or peritoneal cancers. °Bone density scan. This is done to screen for osteoporosis. °Talk with your health care provider about your test results, treatment options, and if necessary, the need for more tests. °Follow these instructions at home: °Eating and drinking ° °Eat a diet that includes fresh fruits and vegetables, whole grains, lean protein, and low-fat dairy products. °Take vitamin and mineral supplements as recommended by your health care provider. °Do not drink alcohol if: °Your health care provider tells you not to drink. °You are pregnant, may be pregnant, or are planning to become pregnant. °If you drink alcohol: °Limit how much you have to 0-1 drink a day. °Know how much alcohol is in your drink. In the [place] one drink equals one 12 oz bottle of beer (355 mL), one 5 oz glass of wine (148 mL), or one 1½ oz glass of hard liquor (44 mL). °Lifestyle °Brush your teeth every morning and night with fluoride toothpaste. Floss one time each day. °Exercise for at least 30 minutes 5 or more days each week. °Do not use any products that contain nicotine or tobacco. These products include cigarettes, chewing tobacco, and vaping devices, such as e-cigarettes. If you need help quitting, ask your health care provider. °Do not use drugs. °If you are sexually active, practice safe sex. Use a condom or other form of protection to prevent   STIs. If you do not wish to become pregnant, use a form of birth control. If you plan to become pregnant, see your health care provider for a  prepregnancy visit. Take aspirin only as told by your health care provider. Make sure that you understand how much to take and what form to take. Work with your health care provider to find out whether it is safe and beneficial for you to take aspirin daily. Find healthy ways to manage stress, such as: Meditation, yoga, or listening to music. Journaling. Talking to a trusted person. Spending time with friends and family. Minimize exposure to UV radiation to reduce your risk of skin cancer. Safety Always wear your seat belt while driving or riding in a vehicle. Do not drive: If you have been drinking alcohol. Do not ride with someone who has been drinking. When you are tired or distracted. While texting. If you have been using any mind-altering substances or drugs. Wear a helmet and other protective equipment during sports activities. If you have firearms in your house, make sure you follow all gun safety procedures. Seek help if you have been physically or sexually abused. What's next? Visit your health care provider once a year for an annual wellness visit. Ask your health care provider how often you should have your eyes and teeth checked. Stay up to date on all vaccines. This information is not intended to replace advice given to you by your health care provider. Make sure you discuss any questions you have with your health care provider. Document Revised: 11/13/2020 Document Reviewed: 11/13/2020 Elsevier Patient Education  [place]

## 2021-07-24 NOTE — Progress Notes (Signed)
Subjective:     [name] Ramos is a 56 y.o. female and is here for a comprehensive physical exam. The patient reports no problems.  Social History   Socioeconomic History   Marital status: Single    Spouse name: Not on file   Number of children: Not on file   Years of education: Not on file   Highest education level: Not on file  Occupational History   Occupation: post office    Employer: [employer]  Tobacco Use   Smoking status: Former    Types: Cigarettes    Quit date: 06/16/2018    Years since quitting: 3.1   Smokeless tobacco: Never  Vaping Use   Vaping Use: Never used  Substance and Sexual Activity   Alcohol use: No    Alcohol/week: 0.0 standard drinks   Drug use: No   Sexual activity: Not Currently    Partners: Male  Other Topics Concern   Not on file  Social History Narrative   Exercising---walking dog daily   Social Determinants of Health   Financial Resource Strain: Not on file  Food Insecurity: Not on file  Transportation Needs: Not on file  Physical Activity: Not on file  Stress: Not on file  Social Connections: Not on file  Intimate Partner Violence: Not on file   Health Maintenance  Topic Date Due   COVID-19 Vaccine (4 - Booster for Christie series) 05/17/2020   HEMOGLOBIN A1C  06/28/2021   URINE MICROALBUMIN  06/28/2021   OPHTHALMOLOGY EXAM  07/18/2021   INFLUENZA VACCINE  08/29/2021 (Originally 12/30/2020)   HIV Screening  08/28/2035 (Originally 10/16/1982)   COLONOSCOPY (Pts 45-104yrs Insurance coverage will need to be confirmed)  11/09/2021   FOOT EXAM  12/26/2021   MAMMOGRAM  01/23/2023   PAP SMEAR-Modifier  08/17/2023   TETANUS/TDAP  02/05/2027   Hepatitis C Screening  Completed   Zoster Vaccines- Shingrix  Completed   HPV VACCINES  Aged Out    The following portions of the patient's history were reviewed and updated as appropriate: allergies, current medications, past family history, past medical history, past social history, past  surgical history, and problem list.  Review of Systems Review of Systems  Constitutional: Negative for activity change, appetite change and fatigue.  HENT: Negative for hearing loss, congestion, tinnitus and ear discharge.  dentist q72m Eyes: Negative for visual disturbance (see optho q1y -- vision corrected to 20/20 with glasses).  Respiratory: Negative for cough, chest tightness and shortness of breath.   Cardiovascular: Negative for chest pain, palpitations and leg swelling.  Gastrointestinal: Negative for abdominal pain, diarrhea, constipation and abdominal distention.  Genitourinary: Negative for urgency, frequency, decreased urine volume and difficulty urinating.  Musculoskeletal: Negative for back pain, arthralgias and gait problem.  Skin: Negative for color change, pallor and rash.  Neurological: Negative for dizziness, light-headedness, numbness and headaches.  Hematological: Negative for adenopathy. Does not bruise/bleed easily.  Psychiatric/Behavioral: Negative for suicidal ideas, confusion, sleep disturbance, self-injury, dysphoric mood, decreased concentration and agitation.      Objective:    BP 124/86 (BP Location: Left Arm, Patient Position: Sitting, Cuff Size: Large)    Pulse 75    Temp 98.4 F (36.9 C) (Oral)    Resp 18    Ht 5\' 5"  (1.651 m)    Wt 226 lb (102.5 kg)    SpO2 98%    BMI 37.61 kg/m  General appearance: alert, cooperative, appears stated age, and no distress Head: Normocephalic, without obvious abnormality, atraumatic Eyes: conjunctivae/corneas  clear. PERRL, EOM's intact. Fundi benign. Ears: normal TM's and external ear canals both ears Nose: Nares normal. Septum midline. Mucosa normal. No drainage or sinus tenderness. Throat: lips, mucosa, and tongue normal; teeth and gums normal Neck: no adenopathy, no carotid bruit, no JVD, supple, symmetrical, trachea midline, and thyroid not enlarged, symmetric, no tenderness/mass/nodules Back: symmetric, no  curvature. ROM normal. No CVA tenderness. Lungs: clear to auscultation bilaterally Heart: regular rate and rhythm, S1, S2 normal, no murmur, click, rub or gallop Abdomen: soft, non-tender; bowel sounds normal; no masses,  no organomegaly Extremities: extremities normal, atraumatic, no cyanosis or edema Pulses: 2+ and symmetric Skin: Skin color, texture, turgor normal. No rashes or lesions Lymph nodes: Cervical, supraclavicular, and axillary nodes normal. Neurologic: Alert and oriented X 3, normal strength and tone. Normal symmetric reflexes. Normal coordination and gait    Assessment:    Healthy female exam.     Plan:    Ghm utd Check labs  See After Visit Summary for Counseling Recommendations   1. Preventative health care Ghm utd  Check labs  - CBC with Differential/Platelet - Comprehensive metabolic panel - Lipid panel - TSH  2. Diet-controlled diabetes mellitus ([place]) hgba1c to be checked  minimize simple carbs. Increase exercise as tolerated. Continue current meds  - Hemoglobin A1c - Microalbumin / creatinine urine ratio  3. Generalized anxiety disorder Stable  - ALPRAZolam (XANAX) 0.25 MG tablet; Take 1 tablet (0.25 mg total) by mouth 3 (three) times daily as needed for anxiety.  Dispense: 30 tablet; Refill: 2  4. Essential hypertension Well controlled, no changes to meds. Encouraged heart healthy diet such as the DASH diet and exercise as tolerated.   - amLODipine (NORVASC) 5 MG tablet; Take 1 tablet (5 mg total) by mouth daily.  Dispense: 90 tablet; Refill: 3 - atenolol (TENORMIN) 100 MG tablet; Take 1 tablet (100 mg total) by mouth daily.  Dispense: 90 tablet; Refill: 3 - hydrochlorothiazide (HYDRODIURIL) 25 MG tablet; Take 1 tablet (25 mg total) by mouth daily.  Dispense: 90 tablet; Refill: 3  5. Hyperlipidemia LDL goal <100 Encourage heart healthy diet such as MIND or DASH diet, increase exercise, avoid trans fats, simple carbohydrates and processed foods,  consider a krill or fish or flaxseed oil cap daily.   - atorvastatin (LIPITOR) 10 MG tablet; Take 1 tablet (10 mg total) by mouth daily.  Dispense: 90 tablet; Refill: 3  6. Anaphylaxis, initial encounter   - EPINEPHrine 0.3 mg/0.3 mL IJ SOAJ injection; Inject 0.3 mg into the muscle as needed for anaphylaxis.  Dispense: 2 each; Refill: 1  7. Uncontrolled type 2 diabetes mellitus with hyperglycemia (HCC)   - metFORMIN (GLUCOPHAGE) 500 MG tablet; Take 1 tablet (500 mg total) by mouth 2 (two) times daily with a meal.  Dispense: 180 tablet; Refill: 1  8. Hyperglycemia   - metFORMIN (GLUCOPHAGE) 500 MG tablet; Take 1 tablet (500 mg total) by mouth 2 (two) times daily with a meal.  Dispense: 180 tablet; Refill: 1  9. Gastroesophageal reflux disease   - omeprazole (PRILOSEC) 40 MG capsule; TAKE 1 CAPSULE BY MOUTH EVERY DAY  Dispense: 90 capsule; Refill: 3  10. Thoracic spine pain   - methocarbamol (ROBAXIN) 500 MG tablet; Take 1 tablet (500 mg total) by mouth every 6 (six) hours as needed for muscle spasms.  Dispense: 30 tablet; Refill: 1  11. Encounter for other general counseling or advice on contraception  F/u gyn - medroxyPROGESTERone Acetate 150 MG/ML SUSY; Inject 1 mL (150  mg total) as directed every 3 (three) months for 1 day.  Dispense: 1 mL; Refill: 2  12. Vitamin D deficiency   - VITAMIN D 25 Hydroxy (Vit-D Deficiency, Fractures)

## 2021-07-24 NOTE — Progress Notes (Signed)
Subjective:   By signing my name below, I, [name], attest that this documentation has been prepared under the direction and in the presence of Ann Held, DO  07/24/2021    Patient ID: [name] Ramos, female    DOB: 16-Jun-1965, 56 y.o.   MRN: [id number]  Chief Complaint  Patient presents with   Annual Exam    Pt states fasting     HPI Patient is in today for a comprehensive physical exam.  She denies having any fever, ear pain, muscle pain, new joint pain, new moles, congestion, sinus pain, sore throat, eye pain, chest pain, palpations, cough, SOB, wheezing, n/v/d, constipation, blood in stool, dysuria, frequency, hematuria, or headaches at this time. She has 3 pfizer Covid-19 vaccines at this time. She is not interested in receiving the flu vaccine. She is UTD on shingrix vaccines.  She is planning on going to the gym more for exercise.  She is due for colonoscopy this year and is planning on scheduling and appointment.    Past Medical History:  Diagnosis Date   Alcohol abuse    recovering alcoholic   Allergic urticaria    Anxiety    Cholecystitis, acute    Congenital anomaly of neck    Depression    Dyslipidemia    Followed by PCP   Gastroparesis    GERD (gastroesophageal reflux disease)    Hematuria    Hiatal hernia    with reflux   Hypertension    stress test 11/2010 normal   Kidney stone    Obesity    PVC's (premature ventricular contractions)    Controlled on beta blocker therapy;  Echo 9/10: EF 20-23%, grade 1 diastolic dysfunction, mild MR, mild LAE   Thyroid nodule    hx   Tinea corporis    Unspecified contraceptive management    Urinary tract infection    Vaginal Pap smear, abnormal     Past Surgical History:  Procedure Laterality Date   leep surgery     mid 20's   TONSILLECTOMY      Family History  Problem Relation Age of Onset   Diabetes Mother    Liver disease Mother    Irritable bowel syndrome Mother    Colon polyps  Mother    Thyroid disease Mother    Hypertension Mother    Breast cancer Mother 15   Hyperlipidemia Father    Hypertension Father    Lung cancer Maternal Uncle    Lung cancer Maternal Grandmother    Heart attack Maternal Grandmother 48   Diabetes Maternal Grandmother    Lung cancer Paternal Grandmother    Coronary artery disease Other        female 1st degree relative 54 yo   Hypertension Other    Colon cancer Neg Hx    Stomach cancer Neg Hx     Social History   Socioeconomic History   Marital status: Single    Spouse name: Not on file   Number of children: Not on file   Years of education: Not on file   Highest education level: Not on file  Occupational History   Occupation: post office    Employer: [employer]  Tobacco Use   Smoking status: Former    Types: Cigarettes    Quit date: 06/16/2018    Years since quitting: 3.1   Smokeless tobacco: Never  Vaping Use   Vaping Use: Never used  Substance and Sexual Activity   Alcohol use:  No    Alcohol/week: 0.0 standard drinks   Drug use: No   Sexual activity: Not Currently    Partners: Male  Other Topics Concern   Not on file  Social History Narrative   Exercising---walking dog daily   Social Determinants of Health   Financial Resource Strain: Not on file  Food Insecurity: Not on file  Transportation Needs: Not on file  Physical Activity: Not on file  Stress: Not on file  Social Connections: Not on file  Intimate Partner Violence: Not on file    Outpatient Medications Prior to Visit  Medication Sig Dispense Refill   albuterol (VENTOLIN HFA) 108 (90 Base) MCG/ACT inhaler Inhale 2 puffs into the lungs every 6 (six) hours as needed for wheezing or shortness of breath. 8 g 0   aspirin 81 MG tablet Take 81 mg by mouth daily.     blood glucose meter kit and supplies Dispense based on patient and insurance preference. Use up to four times daily as directed. (FOR ICD-10 E10.9, E11.9). 1 each 0   clobetasol ointment  (TEMOVATE) 0.05 % Apply to affected area every night for 4 weeks, then every other day for 4 weeks and then twice a week 60 g 5   Ergocalciferol (VITAMIN D2 PO) Take 1,000 mg by mouth daily.     gabapentin (NEURONTIN) 300 MG capsule Take 300 mg by mouth as needed.     ALPRAZolam (XANAX) 0.25 MG tablet Take 1 tablet (0.25 mg total) by mouth 3 (three) times daily as needed for anxiety. 30 tablet 2   amLODipine (NORVASC) 5 MG tablet Take 1 tablet (5 mg total) by mouth daily. 90 tablet 3   atenolol (TENORMIN) 100 MG tablet Take 1 tablet (100 mg total) by mouth daily. 90 tablet 3   atorvastatin (LIPITOR) 10 MG tablet Take 1 tablet (10 mg total) by mouth daily. 90 tablet 3   EPINEPHrine 0.3 mg/0.3 mL IJ SOAJ injection Inject 0.3 mg into the muscle as needed for anaphylaxis. 2 each 1   hydrochlorothiazide (HYDRODIURIL) 25 MG tablet Take 1 tablet (25 mg total) by mouth daily. 90 tablet 3   metFORMIN (GLUCOPHAGE) 500 MG tablet Take 1 tablet (500 mg total) by mouth 2 (two) times daily with a meal. 180 tablet 1   methocarbamol (ROBAXIN) 500 MG tablet Take 1 tablet (500 mg total) by mouth every 6 (six) hours as needed for muscle spasms. 30 tablet 1   omeprazole (PRILOSEC) 40 MG capsule TAKE 1 CAPSULE BY MOUTH EVERY DAY 90 capsule 3   medroxyPROGESTERone Acetate 150 MG/ML SUSY INJECT 1 ML (150 MG TOTAL) AS DIRECTED EVERY 3 (THREE) MONTHS FOR 1 DAY. 1 mL 2   promethazine-dextromethorphan (PROMETHAZINE-DM) 6.25-15 MG/5ML syrup Take 5 mLs by mouth 4 (four) times daily as needed. (Patient not taking: Reported on 07/24/2021) 118 mL 0   No facility-administered medications prior to visit.    Allergies  Allergen Reactions   Ace Inhibitors Other (See Comments)    Numbness, tingling of lips.   Bee Venom Other (See Comments)    Leg swelling   Lisinopril     Other reaction(s): Other (See Comments) other   Piroxicam Other (See Comments)    REACTION: hives REACTION: hives    Review of Systems  Constitutional:   Negative for fever.  HENT:  Negative for congestion and sore throat.   Respiratory:  Negative for cough, shortness of breath and wheezing.   Cardiovascular:  Negative for chest pain.  Gastrointestinal:  Negative  for blood in stool, constipation, diarrhea, nausea and vomiting.  Genitourinary:  Negative for dysuria, frequency and hematuria.  Musculoskeletal:  Negative for joint pain and myalgias.  Skin:        (-)New moles  Neurological:  Negative for headaches.      Objective:    Physical Exam Constitutional:      General: She is not in acute distress.    Appearance: Normal appearance. She is not ill-appearing.  HENT:     Head: Normocephalic and atraumatic.     Right Ear: Tympanic membrane, ear canal and external ear normal.     Left Ear: Tympanic membrane, ear canal and external ear normal.  Eyes:     Extraocular Movements: Extraocular movements intact.     Pupils: Pupils are equal, round, and reactive to light.  Cardiovascular:     Rate and Rhythm: Normal rate and regular rhythm.     Heart sounds: Normal heart sounds. No murmur heard.   No gallop.  Pulmonary:     Effort: Pulmonary effort is normal. No respiratory distress.     Breath sounds: Normal breath sounds. No wheezing or rales.  Abdominal:     General: Bowel sounds are normal. There is no distension.     Palpations: Abdomen is soft.     Tenderness: There is no abdominal tenderness. There is no guarding.  Skin:    General: Skin is warm and dry.  Neurological:     Mental Status: She is alert and oriented to person, place, and time.  Psychiatric:        Behavior: Behavior normal.    BP 124/86 (BP Location: Left Arm, Patient Position: Sitting, Cuff Size: Large)    Pulse 75    Temp 98.4 F (36.9 C) (Oral)    Resp 18    Ht 5' 5"  (1.651 m)    Wt 226 lb (102.5 kg)    SpO2 98%    BMI 37.61 kg/m  Wt Readings from Last 3 Encounters:  07/24/21 226 lb (102.5 kg)  02/06/21 220 lb 3.2 oz (99.9 kg)  12/26/20 219 lb 12.8 oz  (99.7 kg)    Diabetic Foot Exam - Simple   No data filed    Lab Results  Component Value Date   WBC 7.2 06/28/2020   HGB 13.9 06/28/2020   HCT 41.9 06/28/2020   PLT 262.0 06/28/2020   GLUCOSE 139 (H) 12/26/2020   CHOL 153 12/26/2020   TRIG 109.0 12/26/2020   HDL 49.70 12/26/2020   LDLDIRECT 148.1 01/20/2008   LDLCALC 82 12/26/2020   ALT 27 12/26/2020   AST 17 12/26/2020   NA 138 12/26/2020   K 4.0 12/26/2020   CL 103 12/26/2020   CREATININE 0.87 12/26/2020   BUN 10 12/26/2020   CO2 27 12/26/2020   TSH 0.84 06/28/2020   HGBA1C 7.0 (H) 12/26/2020   MICROALBUR <0.7 06/28/2020    Lab Results  Component Value Date   TSH 0.84 06/28/2020   Lab Results  Component Value Date   WBC 7.2 06/28/2020   HGB 13.9 06/28/2020   HCT 41.9 06/28/2020   MCV 92.1 06/28/2020   PLT 262.0 06/28/2020   Lab Results  Component Value Date   NA 138 12/26/2020   K 4.0 12/26/2020   CO2 27 12/26/2020   GLUCOSE 139 (H) 12/26/2020   BUN 10 12/26/2020   CREATININE 0.87 12/26/2020   BILITOT 0.8 12/26/2020   ALKPHOS 58 12/26/2020   AST 17 12/26/2020   ALT 27  12/26/2020   PROT 6.7 12/26/2020   ALBUMIN 4.1 12/26/2020   CALCIUM 9.6 12/26/2020   ANIONGAP 8 08/30/2015   GFR 76.19 12/26/2020   Lab Results  Component Value Date   CHOL 153 12/26/2020   Lab Results  Component Value Date   HDL 49.70 12/26/2020   Lab Results  Component Value Date   LDLCALC 82 12/26/2020   Lab Results  Component Value Date   TRIG 109.0 12/26/2020   Lab Results  Component Value Date   CHOLHDL 3 12/26/2020   Lab Results  Component Value Date   HGBA1C 7.0 (H) 12/26/2020   Mammogram- Last completed 01/22/2021. Results are normal. Repeat in 1 year.  Colonoscopy- Last completed 11/10/2018.  Pap smear- Last completed 08/16/2021.      Assessment & Plan:   Problem List Items Addressed This Visit       Endocrine   Diet-controlled diabetes mellitus ([place])   Relevant Medications   atorvastatin  (LIPITOR) 10 MG tablet   metFORMIN (GLUCOPHAGE) 500 MG tablet   Other Relevant Orders   Hemoglobin A1c   Microalbumin / creatinine urine ratio   Uncontrolled type 2 diabetes mellitus with hyperglycemia ([facility])   Relevant Medications   atorvastatin (LIPITOR) 10 MG tablet   metFORMIN (GLUCOPHAGE) 500 MG tablet     Other   Preventative health care - Primary   Relevant Orders   CBC with Differential/Platelet   Comprehensive metabolic panel   Lipid panel   TSH   Hyperglycemia   Relevant Medications   metFORMIN (GLUCOPHAGE) 500 MG tablet   Other Visit Diagnoses     Generalized anxiety disorder       Relevant Medications   ALPRAZolam (XANAX) 0.25 MG tablet   Essential hypertension       Relevant Medications   amLODipine (NORVASC) 5 MG tablet   atenolol (TENORMIN) 100 MG tablet   atorvastatin (LIPITOR) 10 MG tablet   EPINEPHrine 0.3 mg/0.3 mL IJ SOAJ injection   hydrochlorothiazide (HYDRODIURIL) 25 MG tablet   Hyperlipidemia LDL goal <100       Relevant Medications   amLODipine (NORVASC) 5 MG tablet   atenolol (TENORMIN) 100 MG tablet   atorvastatin (LIPITOR) 10 MG tablet   EPINEPHrine 0.3 mg/0.3 mL IJ SOAJ injection   hydrochlorothiazide (HYDRODIURIL) 25 MG tablet   Anaphylaxis, initial encounter       Relevant Medications   EPINEPHrine 0.3 mg/0.3 mL IJ SOAJ injection   Gastroesophageal reflux disease       Relevant Medications   omeprazole (PRILOSEC) 40 MG capsule   Thoracic spine pain       Relevant Medications   methocarbamol (ROBAXIN) 500 MG tablet   Encounter for other general counseling or advice on contraception       Relevant Medications   medroxyPROGESTERone Acetate 150 MG/ML SUSY   Vitamin D deficiency       Relevant Orders   VITAMIN D 25 Hydroxy (Vit-D Deficiency, Fractures)        Meds ordered this encounter  Medications   ALPRAZolam (XANAX) 0.25 MG tablet    Sig: Take 1 tablet (0.25 mg total) by mouth 3 (three) times daily as needed for anxiety.     Dispense:  30 tablet    Refill:  2   amLODipine (NORVASC) 5 MG tablet    Sig: Take 1 tablet (5 mg total) by mouth daily.    Dispense:  90 tablet    Refill:  3   atenolol (TENORMIN) 100 MG tablet  Sig: Take 1 tablet (100 mg total) by mouth daily.    Dispense:  90 tablet    Refill:  3   atorvastatin (LIPITOR) 10 MG tablet    Sig: Take 1 tablet (10 mg total) by mouth daily.    Dispense:  90 tablet    Refill:  3   EPINEPHrine 0.3 mg/0.3 mL IJ SOAJ injection    Sig: Inject 0.3 mg into the muscle as needed for anaphylaxis.    Dispense:  2 each    Refill:  1   hydrochlorothiazide (HYDRODIURIL) 25 MG tablet    Sig: Take 1 tablet (25 mg total) by mouth daily.    Dispense:  90 tablet    Refill:  3   metFORMIN (GLUCOPHAGE) 500 MG tablet    Sig: Take 1 tablet (500 mg total) by mouth 2 (two) times daily with a meal.    Dispense:  180 tablet    Refill:  1   omeprazole (PRILOSEC) 40 MG capsule    Sig: TAKE 1 CAPSULE BY MOUTH EVERY DAY    Dispense:  90 capsule    Refill:  3   methocarbamol (ROBAXIN) 500 MG tablet    Sig: Take 1 tablet (500 mg total) by mouth every 6 (six) hours as needed for muscle spasms.    Dispense:  30 tablet    Refill:  1   medroxyPROGESTERone Acetate 150 MG/ML SUSY    Sig: Inject 1 mL (150 mg total) as directed every 3 (three) months for 1 day.    Dispense:  1 mL    Refill:  2    DX Code Needed  .    I, Ann Held, DO, personally preformed the services described in this documentation.  All medical record entries made by the scribe were at my direction and in my presence.  I have reviewed the chart and discharge instructions (if applicable) and agree that the record reflects my personal performance and is accurate and complete. 07/24/2021   I,[name],acting as a scribe for Ann Held, DO.,have documented all relevant documentation on the behalf of Ann Held, DO,as directed by  Ann Held, DO while in the presence of  Ann Held, DO.   Shehryar [employer]

## 2021-07-29 ENCOUNTER — Encounter: Payer: Self-pay | Admitting: Family Medicine

## 2021-07-29 ENCOUNTER — Other Ambulatory Visit: Payer: Self-pay | Admitting: Family Medicine

## 2021-07-29 DIAGNOSIS — E1165 Type 2 diabetes mellitus with hyperglycemia: Secondary | ICD-10-CM

## 2021-07-30 ENCOUNTER — Telehealth: Payer: Self-pay

## 2021-07-30 MED ORDER — MOUNJARO 2.5 MG/0.5ML ~~LOC~~ SOAJ: 2.5000 mg | SUBCUTANEOUS | 0 refills | Status: DC

## 2021-07-30 NOTE — Telephone Encounter (Signed)
PA initiated via Covermymeds; KEY: [id number]. Awaiting determination.  ?

## 2021-07-31 NOTE — Telephone Encounter (Signed)
PA denied. Must have tried and failed following GLP-1 receptors first- Trulicity, Victoza, Ozempic) ?

## 2021-08-04 ENCOUNTER — Other Ambulatory Visit: Payer: Self-pay

## 2021-08-04 ENCOUNTER — Encounter: Payer: Self-pay | Admitting: Family Medicine

## 2021-08-04 MED ORDER — OZEMPIC (0.25 OR 0.5 MG/DOSE) 2 MG/1.5ML ~~LOC~~ SOPN: 0.2500 mg | PEN_INJECTOR | SUBCUTANEOUS | 0 refills | Status: DC

## 2021-08-04 NOTE — Telephone Encounter (Signed)
Pt made aware. Ozempic sent in  ?

## 2021-08-05 ENCOUNTER — Other Ambulatory Visit (HOSPITAL_BASED_OUTPATIENT_CLINIC_OR_DEPARTMENT_OTHER): Payer: Self-pay

## 2021-08-05 MED ORDER — OZEMPIC (0.25 OR 0.5 MG/DOSE) 2 MG/1.5ML ~~LOC~~ SOPN
0.2500 mg | PEN_INJECTOR | SUBCUTANEOUS | 0 refills | Status: DC
  Filled 2021-08-05: qty 1.5, 56d supply, fill #0
  Filled 2021-09-12: qty 1.5, 42d supply, fill #0

## 2021-08-06 LAB — HM DIABETES EYE EXAM

## 2021-08-19 ENCOUNTER — Encounter: Payer: Self-pay | Admitting: *Deleted

## 2021-08-19 ENCOUNTER — Ambulatory Visit (INDEPENDENT_AMBULATORY_CARE_PROVIDER_SITE_OTHER): Payer: Federal, State, Local not specified - PPO | Admitting: *Deleted

## 2021-08-19 DIAGNOSIS — Z7189 Other specified counseling: Secondary | ICD-10-CM

## 2021-08-19 DIAGNOSIS — H02834 Dermatochalasis of left upper eyelid: Secondary | ICD-10-CM | POA: Diagnosis not present

## 2021-08-19 DIAGNOSIS — H02831 Dermatochalasis of right upper eyelid: Secondary | ICD-10-CM | POA: Diagnosis not present

## 2021-08-19 DIAGNOSIS — E1165 Type 2 diabetes mellitus with hyperglycemia: Secondary | ICD-10-CM | POA: Diagnosis not present

## 2021-08-19 NOTE — Progress Notes (Signed)
Pt is here for injection education. Pt will be taking Ozempic. Pt supplied her own Ozempic pen. Demonstrated and walked through proper injection technique with pt.   ?  ?Pt administered his first injection in the abdomen , tolerated well. Pt now feels comfortable self administering Ozempic. ? ?Also patient had a question if she should get a new glucose machine.  She checked her glucose and glucose was in range.  Advised to keep machine until new one is needed.  Machine works well. ?

## 2021-09-08 ENCOUNTER — Encounter: Payer: Self-pay | Admitting: Family Medicine

## 2021-09-12 ENCOUNTER — Other Ambulatory Visit: Payer: Self-pay | Admitting: Family Medicine

## 2021-09-12 ENCOUNTER — Other Ambulatory Visit (HOSPITAL_BASED_OUTPATIENT_CLINIC_OR_DEPARTMENT_OTHER): Payer: Self-pay

## 2021-09-12 MED ORDER — OZEMPIC (0.25 OR 0.5 MG/DOSE) 2 MG/1.5ML ~~LOC~~ SOPN
0.5000 mg | PEN_INJECTOR | SUBCUTANEOUS | 0 refills | Status: DC
  Filled 2021-09-12: qty 1.5, 28d supply, fill #0

## 2021-10-15 ENCOUNTER — Other Ambulatory Visit (HOSPITAL_BASED_OUTPATIENT_CLINIC_OR_DEPARTMENT_OTHER): Payer: Self-pay

## 2021-10-15 ENCOUNTER — Other Ambulatory Visit: Payer: Self-pay | Admitting: Family Medicine

## 2021-10-15 MED ORDER — OZEMPIC (0.25 OR 0.5 MG/DOSE) 2 MG/3ML ~~LOC~~ SOPN
0.5000 mg | PEN_INJECTOR | SUBCUTANEOUS | 3 refills | Status: DC
  Filled 2021-10-15: qty 3, 28d supply, fill #0

## 2021-10-28 ENCOUNTER — Other Ambulatory Visit (INDEPENDENT_AMBULATORY_CARE_PROVIDER_SITE_OTHER): Payer: Federal, State, Local not specified - PPO

## 2021-10-28 DIAGNOSIS — E1165 Type 2 diabetes mellitus with hyperglycemia: Secondary | ICD-10-CM

## 2021-10-28 LAB — COMPREHENSIVE METABOLIC PANEL
ALT: 53 U/L — ABNORMAL HIGH (ref 0–35)
AST: 30 U/L (ref 0–37)
Albumin: 4.3 g/dL (ref 3.5–5.2)
Alkaline Phosphatase: 51 U/L (ref 39–117)
BUN: 15 mg/dL (ref 6–23)
CO2: 27 mEq/L (ref 19–32)
Calcium: 9.9 mg/dL (ref 8.4–10.5)
Chloride: 102 mEq/L (ref 96–112)
Creatinine, Ser: 0.91 mg/dL (ref 0.40–1.20)
GFR: 71.76 mL/min (ref >60.00)
Glucose, Bld: 129 mg/dL — ABNORMAL HIGH (ref 70–99)
Potassium: 4 mEq/L (ref 3.5–5.1)
Sodium: 139 mEq/L (ref 135–145)
Total Bilirubin: 0.5 mg/dL (ref 0.2–1.2)
Total Protein: 6.4 g/dL (ref 6.0–8.3)

## 2021-10-28 LAB — MICROALBUMIN / CREATININE URINE RATIO
Creatinine,U: 270.5 mg/dL
Microalb Creat Ratio: 0.7 mg/g (ref 0.0–30.0)
Microalb, Ur: 1.8 mg/dL (ref 0.0–1.9)

## 2021-10-28 LAB — LIPID PANEL
Cholesterol: 113 mg/dL (ref 0–200)
HDL: 42.2 mg/dL (ref >39.00)
LDL Cholesterol: 53 mg/dL (ref 0–99)
NonHDL: 71.26
Total CHOL/HDL Ratio: 3
Triglycerides: 92 mg/dL (ref 0.0–149.0)
VLDL: 18.4 mg/dL (ref 0.0–40.0)

## 2021-10-28 NOTE — Addendum Note (Signed)
Addended by: Kelle Darting A on: 10/28/2021 12:04 PM   Modules accepted: Orders

## 2021-10-29 DIAGNOSIS — L820 Inflamed seborrheic keratosis: Secondary | ICD-10-CM | POA: Diagnosis not present

## 2021-10-29 LAB — HEMOGLOBIN A1C: Hgb A1c MFr Bld: 6.3 % (ref 4.6–6.5)

## 2021-10-29 LAB — INSULIN, RANDOM: Insulin: 45.5 u[IU]/mL — ABNORMAL HIGH

## 2021-10-30 ENCOUNTER — Other Ambulatory Visit: Payer: Self-pay | Admitting: Family Medicine

## 2021-10-30 DIAGNOSIS — Z1231 Encounter for screening mammogram for malignant neoplasm of breast: Secondary | ICD-10-CM

## 2021-11-05 ENCOUNTER — Encounter: Payer: Self-pay | Admitting: Family Medicine

## 2021-11-07 ENCOUNTER — Other Ambulatory Visit (HOSPITAL_BASED_OUTPATIENT_CLINIC_OR_DEPARTMENT_OTHER): Payer: Self-pay

## 2021-11-07 MED ORDER — SEMAGLUTIDE (1 MG/DOSE) 4 MG/3ML ~~LOC~~ SOPN
1.0000 mg | PEN_INJECTOR | SUBCUTANEOUS | 3 refills | Status: DC
  Filled 2021-11-07: qty 3, 28d supply, fill #0
  Filled 2021-12-04: qty 3, 28d supply, fill #1
  Filled 2022-01-11: qty 3, 28d supply, fill #2
  Filled 2022-02-05: qty 3, 28d supply, fill #3

## 2021-12-04 ENCOUNTER — Other Ambulatory Visit (HOSPITAL_BASED_OUTPATIENT_CLINIC_OR_DEPARTMENT_OTHER): Payer: Self-pay

## 2021-12-31 ENCOUNTER — Ambulatory Visit: Payer: Federal, State, Local not specified - PPO | Admitting: Interventional Cardiology

## 2022-01-02 ENCOUNTER — Ambulatory Visit: Payer: Federal, State, Local not specified - PPO | Admitting: Interventional Cardiology

## 2022-01-07 ENCOUNTER — Encounter (INDEPENDENT_AMBULATORY_CARE_PROVIDER_SITE_OTHER): Payer: Self-pay

## 2022-01-07 NOTE — Progress Notes (Signed)
Cardiology Office Note:    Date:  01/09/2022   ID:  [name] Ramos, DOB [birth year], MRN [id number]  PCP:  Carollee Herter, Alferd Apa, DO  Cardiologist:  Sinclair Grooms, MD   Referring MD: Carollee Herter, Alferd Apa, *   Chief Complaint  Patient presents with   Follow-up    Left bundle Family history CAD Risk factors for CAD Chest pain    History of Present Illness:    [name] Ramos is a 56 y.o. female with a hx of premature ventricular contractions, left bundle branch block, family history of coronary artery disease, and atypical chest pain.   She is doing well.  She denies shortness of breath, orthopnea, PND, lower extremity swelling, and chest pain on exertion.  She can remember 1 episode of chest discomfort in the left precordium that lasted 3 to 5 minutes but did not have associated symptoms or radiation.  This was several months ago.  She has never had a before or since.  Past Medical History:  Diagnosis Date   Alcohol abuse    recovering alcoholic   Allergic urticaria    Anxiety    Cholecystitis, acute    Congenital anomaly of neck    Depression    Dyslipidemia    Followed by PCP   Gastroparesis    GERD (gastroesophageal reflux disease)    Hematuria    Hiatal hernia    with reflux   Hypertension    stress test 11/2010 normal   Kidney stone    Obesity    PVC's (premature ventricular contractions)    Controlled on beta blocker therapy;  Echo 9/10: EF 24-40%, grade 1 diastolic dysfunction, mild MR, mild LAE   Thyroid nodule    hx   Tinea corporis    Unspecified contraceptive management    Urinary tract infection    Vaginal Pap smear, abnormal     Past Surgical History:  Procedure Laterality Date   leep surgery     mid 20's   TONSILLECTOMY      Current Medications: Current Meds  Medication Sig   albuterol (VENTOLIN HFA) 108 (90 Base) MCG/ACT inhaler Inhale 2 puffs into the lungs every 6 (six) hours as needed for wheezing or shortness of breath.    ALPRAZolam (XANAX) 0.25 MG tablet Take 1 tablet (0.25 mg total) by mouth 3 (three) times daily as needed for anxiety.   amLODipine (NORVASC) 5 MG tablet Take 1 tablet (5 mg total) by mouth daily.   aspirin 81 MG tablet Take 81 mg by mouth daily.   atenolol (TENORMIN) 100 MG tablet Take 1 tablet (100 mg total) by mouth daily.   atorvastatin (LIPITOR) 10 MG tablet Take 1 tablet (10 mg total) by mouth daily.   blood glucose meter kit and supplies Dispense based on patient and insurance preference. Use up to four times daily as directed. (FOR ICD-10 E10.9, E11.9).   clobetasol ointment (TEMOVATE) 0.05 % Apply to affected area every night for 4 weeks, then every other day for 4 weeks and then twice a week   EPINEPHrine 0.3 mg/0.3 mL IJ SOAJ injection Inject 0.3 mg into the muscle as needed for anaphylaxis.   Ergocalciferol (VITAMIN D2 PO) Take 1,000 mg by mouth daily.   gabapentin (NEURONTIN) 300 MG capsule Take 300 mg by mouth as needed.   hydrochlorothiazide (HYDRODIURIL) 25 MG tablet Take 1 tablet (25 mg total) by mouth daily.   metFORMIN (GLUCOPHAGE) 500 MG tablet Take 1 tablet (500 mg total) by  mouth 2 (two) times daily with a meal.   methocarbamol (ROBAXIN) 500 MG tablet Take 1 tablet (500 mg total) by mouth every 6 (six) hours as needed for muscle spasms.   omeprazole (PRILOSEC) 40 MG capsule TAKE 1 CAPSULE BY MOUTH EVERY DAY   predniSONE (DELTASONE) 5 MG tablet Take 1 tablet by mouth as needed.   Semaglutide, 1 MG/DOSE, 4 MG/3ML SOPN Inject 1 mg into the skin once a week.     Allergies:   Ace inhibitors, Bee venom, Lisinopril, and Piroxicam   Social History   Socioeconomic History   Marital status: Single    Spouse name: Not on file   Number of children: Not on file   Years of education: Not on file   Highest education level: Not on file  Occupational History   Occupation: post office    Employer: [employer]  Tobacco Use   Smoking status: Former    Types: Cigarettes    Quit  date: 06/16/2018    Years since quitting: 3.5   Smokeless tobacco: Never  Vaping Use   Vaping Use: Never used  Substance and Sexual Activity   Alcohol use: No    Alcohol/week: 0.0 standard drinks of alcohol   Drug use: No   Sexual activity: Not Currently    Partners: Male  Other Topics Concern   Not on file  Social History Narrative   Exercising---walking dog daily   Social Determinants of Health   Financial Resource Strain: Not on file  Food Insecurity: Not on file  Transportation Needs: Not on file  Physical Activity: Not on file  Stress: Not on file  Social Connections: Not on file     Family History: The patient's family history includes Breast cancer (age of onset: 20) in her mother; Colon polyps in her mother; Coronary artery disease in an other family member; Diabetes in her maternal grandmother and mother; Heart attack (age of onset: 30) in her maternal grandmother; Hyperlipidemia in her father; Hypertension in her father, mother, and another family member; Irritable bowel syndrome in her mother; Liver disease in her mother; Lung cancer in her maternal grandmother, maternal uncle, and paternal grandmother; Thyroid disease in her mother. There is no history of Colon cancer or Stomach cancer.  ROS:   Please see the history of present illness.    Denies any new complaints.  Still working at [employer].  Remains active on her job.  All other systems reviewed and are negative.  EKGs/Labs/Other Studies Reviewed:    The following studies were reviewed today: 2D Doppler echocardiogram July 2022: IMPRESSIONS     1. Left ventricular ejection fraction, by estimation, is 55 to 60%. Left  ventricular ejection fraction by 3D volume is 56 %. The left ventricle has  normal function. The left ventricle has no regional wall motion  abnormalities. Left ventricular diastolic   parameters are consistent with Grade I diastolic dysfunction (impaired  relaxation). The average left  ventricular global longitudinal strain is  -24.9 %. The global longitudinal strain is normal.   2. Right ventricular systolic function is normal. The right ventricular  size is normal. There is normal pulmonary artery systolic pressure.   3. The mitral valve is grossly normal. Mild mitral valve regurgitation.  No evidence of mitral stenosis.   4. The aortic valve is tricuspid. Aortic valve regurgitation is not  visualized. No aortic stenosis is present.   Comparison(s): Prior images unable to be directly viewed, comparison made  by report  only. Stress echo 09/06/14 EF 65% per report.  EKG:  EKG sinus rhythm, 79 bpm, left bundle branch block with left axis deviation.  QRSD 142 ms.  Recent Labs: 07/24/2021: Hemoglobin 13.2; Platelets 261.0; TSH 0.82 10/28/2021: ALT 53; BUN 15; Creatinine, Ser 0.91; Potassium 4.0; Sodium 139  Recent Lipid Panel    Component Value Date/Time   CHOL 113 10/28/2021 0735   TRIG 92.0 10/28/2021 0735   HDL 42.20 10/28/2021 0735   CHOLHDL 3 10/28/2021 0735   VLDL 18.4 10/28/2021 0735   LDLCALC 53 10/28/2021 0735   LDLDIRECT 148.1 01/20/2008 0912    Physical Exam:    VS:  BP 108/68   Pulse 79   Ht _0  (1.651 m)   Wt 199 lb (90.3 kg)   SpO2 98%   BMI 33.12 kg/m     Wt Readings from Last 3 Encounters:  01/09/22 199 lb (90.3 kg)  07/24/21 226 lb (102.5 kg)  02/06/21 220 lb 3.2 oz (99.9 kg)     GEN: Overweight but recent weight loss about 30 pounds.1. No acute distress HEENT: Normal NECK: No JVD. LYMPHATICS: No lymphadenopathy CARDIAC: No murmur. RRR no gallop, or edema. VASCULAR:  Normal Pulses. No bruits. RESPIRATORY:  Clear to auscultation without rales, wheezing or rhonchi  ABDOMEN: Soft, non-tender, non-distended, No pulsatile mass, MUSCULOSKELETAL: No deformity  SKIN: Warm and dry NEUROLOGIC:  Alert and oriented x 3 PSYCHIATRIC:  Normal affect   ASSESSMENT:    1. Left bundle branch block   2. Essential hypertension   3.  Hyperlipidemia with target LDL less than 100   4. Tobacco use   5. Family history of early CAD    PLAN:    In order of problems listed above:  Stable and unchanged Excellent control on 3 drug therapy.  Continue as listed Continue statin therapy. Recommended smoking cessation. Continue to "control risk factors including blood pressure and lipids.  Encourage physical activity.  Notify us if recurring chest pain.  She did mention 1 episode but it lasted less than 10 minutes and has not recurred.   Overall education and awareness concerning primary risk prevention was discussed in detail: LDL less than 70, hemoglobin A1c less than 7, blood pressure target less than 130/80 mmHg, >150 minutes of moderate aerobic activity per week, avoidance of smoking, weight control (via diet and exercise), and continued surveillance/management of/for obstructive sleep apnea.    Medication Adjustments/Labs and Tests Ordered: Current medicines are reviewed at length with the patient today.  Concerns regarding medicines are outlined above.  No orders of the defined types were placed in this encounter.  No orders of the defined types were placed in this encounter.   There are no Patient Instructions on file for this visit.   Signed, Sinclair Grooms, MD  01/09/2022 8:29 AM    [facility]

## 2022-01-09 ENCOUNTER — Encounter: Payer: Self-pay | Admitting: Interventional Cardiology

## 2022-01-09 ENCOUNTER — Ambulatory Visit: Payer: Federal, State, Local not specified - PPO | Admitting: Interventional Cardiology

## 2022-01-09 VITALS — BP 108/68 | HR 79 | Ht 65.0 in | Wt 199.0 lb

## 2022-01-09 DIAGNOSIS — Z720 Tobacco use: Secondary | ICD-10-CM

## 2022-01-09 DIAGNOSIS — E785 Hyperlipidemia, unspecified: Secondary | ICD-10-CM

## 2022-01-09 DIAGNOSIS — I447 Left bundle-branch block, unspecified: Secondary | ICD-10-CM | POA: Diagnosis not present

## 2022-01-09 DIAGNOSIS — Z8249 Family history of ischemic heart disease and other diseases of the circulatory system: Secondary | ICD-10-CM

## 2022-01-09 DIAGNOSIS — I10 Essential (primary) hypertension: Secondary | ICD-10-CM

## 2022-01-09 NOTE — Patient Instructions (Signed)

## 2022-01-12 ENCOUNTER — Other Ambulatory Visit (HOSPITAL_BASED_OUTPATIENT_CLINIC_OR_DEPARTMENT_OTHER): Payer: Self-pay

## 2022-01-20 DIAGNOSIS — L02229 Furuncle of trunk, unspecified: Secondary | ICD-10-CM | POA: Diagnosis not present

## 2022-01-20 DIAGNOSIS — L259 Unspecified contact dermatitis, unspecified cause: Secondary | ICD-10-CM | POA: Diagnosis not present

## 2022-01-22 ENCOUNTER — Ambulatory Visit: Payer: Federal, State, Local not specified - PPO | Admitting: Family Medicine

## 2022-01-28 DIAGNOSIS — M542 Cervicalgia: Secondary | ICD-10-CM | POA: Diagnosis not present

## 2022-01-29 ENCOUNTER — Ambulatory Visit (INDEPENDENT_AMBULATORY_CARE_PROVIDER_SITE_OTHER): Payer: Federal, State, Local not specified - PPO

## 2022-01-29 DIAGNOSIS — Z1231 Encounter for screening mammogram for malignant neoplasm of breast: Secondary | ICD-10-CM | POA: Diagnosis not present

## 2022-02-01 DIAGNOSIS — M542 Cervicalgia: Secondary | ICD-10-CM | POA: Diagnosis not present

## 2022-02-05 ENCOUNTER — Ambulatory Visit: Payer: Federal, State, Local not specified - PPO | Admitting: Family Medicine

## 2022-02-05 ENCOUNTER — Encounter: Payer: Self-pay | Admitting: Family Medicine

## 2022-02-05 ENCOUNTER — Other Ambulatory Visit: Payer: Self-pay | Admitting: Family Medicine

## 2022-02-05 ENCOUNTER — Other Ambulatory Visit (HOSPITAL_BASED_OUTPATIENT_CLINIC_OR_DEPARTMENT_OTHER): Payer: Self-pay

## 2022-02-05 VITALS — BP 108/78 | HR 73 | Temp 98.1°F | Resp 18 | Ht 65.0 in | Wt 191.4 lb

## 2022-02-05 DIAGNOSIS — E559 Vitamin D deficiency, unspecified: Secondary | ICD-10-CM | POA: Diagnosis not present

## 2022-02-05 DIAGNOSIS — F320 Major depressive disorder, single episode, mild: Secondary | ICD-10-CM

## 2022-02-05 DIAGNOSIS — E785 Hyperlipidemia, unspecified: Secondary | ICD-10-CM | POA: Diagnosis not present

## 2022-02-05 DIAGNOSIS — E1169 Type 2 diabetes mellitus with other specified complication: Secondary | ICD-10-CM

## 2022-02-05 DIAGNOSIS — I10 Essential (primary) hypertension: Secondary | ICD-10-CM | POA: Diagnosis not present

## 2022-02-05 DIAGNOSIS — E1165 Type 2 diabetes mellitus with hyperglycemia: Secondary | ICD-10-CM

## 2022-02-05 DIAGNOSIS — E042 Nontoxic multinodular goiter: Secondary | ICD-10-CM

## 2022-02-05 DIAGNOSIS — I739 Peripheral vascular disease, unspecified: Secondary | ICD-10-CM

## 2022-02-05 MED ORDER — SEMAGLUTIDE (1 MG/DOSE) 4 MG/3ML ~~LOC~~ SOPN: 1.0000 mg | PEN_INJECTOR | SUBCUTANEOUS | 3 refills | Status: DC

## 2022-02-05 MED ORDER — SEMAGLUTIDE (1 MG/DOSE) 4 MG/3ML ~~LOC~~ SOPN
1.0000 mg | PEN_INJECTOR | SUBCUTANEOUS | 3 refills | Status: DC
  Filled 2022-02-05: qty 3, 28d supply, fill #0
  Filled 2022-03-15: qty 3, 28d supply, fill #1

## 2022-02-05 NOTE — Assessment & Plan Note (Signed)
Well controlled, no changes to meds. Encouraged heart healthy diet such as the DASH diet and exercise as tolerated.  °

## 2022-02-05 NOTE — Assessment & Plan Note (Signed)
Encourage heart healthy diet such as MIND or DASH diet, increase exercise, avoid trans fats, simple carbohydrates and processed foods, consider a krill or fish or flaxseed oil cap daily.  °

## 2022-02-05 NOTE — Progress Notes (Signed)
Established Patient Office Visit  Subjective   Patient ID: [name] Ramos, female    DOB: [birth year]  Age: 56 y.o. MRN: [id number]  Chief Complaint  Patient presents with   Hypertension   Hyperlipidemia   Diabetes   Follow-up    HPI Pt here f/u dm, chol and bp  HPI HYPERTENSION  Blood pressure range-not checking   Chest pain- no      Dyspnea- no Lightheadedness- no   Edema- no Other side effects - no   Medication compliance: good Low salt diet- yes  DIABETES  Blood Sugar ranges-good per pt   Polyuria- no New Visual problems- no Hypoglycemic symptoms- no Other side effects-no Medication compliance - good Last eye exam- utd Foot exam- today  HYPERLIPIDEMIA  Medication compliance- good RUQ pain- no  Muscle aches- no Other side effects-no  ROS Review of Systems  Constitutional: Negative for activity change, appetite change and fatigue.  HENT: Negative for hearing loss, congestion, tinnitus and ear discharge.  dentist q66m Eyes: Negative for visual disturbance (see optho q1y -- vision corrected to 20/20 with glasses).  Respiratory: Negative for cough, chest tightness and shortness of breath.   Cardiovascular: Negative for chest pain, palpitations and leg swelling.  Gastrointestinal: Negative for abdominal pain, diarrhea, constipation and abdominal distention.  Genitourinary: Negative for urgency, frequency, decreased urine volume and difficulty urinating.  Musculoskeletal: Negative for back pain, arthralgias and gait problem.  Skin: Negative for color change, pallor and rash.  Neurological: Negative for dizziness, light-headedness, numbness and headaches.  Hematological: Negative for adenopathy. Does not bruise/bleed easily.  Psychiatric/Behavioral: Negative for suicidal ideas, confusion, sleep disturbance, self-injury, dysphoric mood, decreased concentration and agitation.     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   Uncontrolled type 2 diabetes mellitus with hyperglycemia (HCC) 02/06/2021   Hyperglycemia 12/26/2020   Depression, major, single episode, mild (HCC) 12/26/2020   PAD (peripheral artery disease) (HCC) 12/26/2020   Morbid obesity (HCC) 12/26/2020   Hyperlipidemia associated with type 2 diabetes mellitus (HCC) 07/19/2020   Diet-controlled diabetes mellitus (HCC) 07/19/2020   Ruptured disc, cervical 06/29/2019   Preventative health care 06/29/2019   Mid back pain 02/20/2019   Psoriasis 02/04/2017   Left bundle branch block 09/02/2015   Chest pain 08/31/2015   Ingrown nail 12/04/2014   Pain in toe of left foot 12/04/2014   Obesity (BMI 30-39.9) 05/01/2013   Tremor 04/15/2012   Nausea and vomiting 04/01/2012   Depression with anxiety 01/25/2012   Alcohol abuse 01/22/2012   PVC (premature ventricular contraction) 09/09/2011   Hordeolum externum of left upper eyelid 12/15/2010   Hair loss 10/16/2010   Lymph node enlargement 10/16/2010   Eczema 10/16/2010   VAGINITIS 04/22/2010   HEMATURIA, HX OF 04/22/2010   Hyperlipidemia 04/10/2010   WART, RIGHT HAND 03/21/2010   DIZZINESS 02/27/2009   FECAL INCONTINENCE 02/21/2009   HIATAL HERNIA WITH REFLUX, HX OF 02/21/2009   CHOLECYSTITIS - ACUTE 08/07/2008   ALLERGIC URTICARIA 03/16/2008   TINEA CORPORIS 01/20/2008   GERD 09/02/2007   DEPRESSION 02/24/2007   Primary hypertension 01/21/2007   HEMATURIA 01/21/2007   ANOMALY, 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   THYROID NODULE, HX OF 01/21/2007   Past Medical History:  Diagnosis Date   Alcohol abuse    recovering alcoholic   Allergic urticaria    Anxiety    Cholecystitis, acute    Congenital anomaly of neck    Depression  Dyslipidemia    Followed by PCP   Gastroparesis    GERD (gastroesophageal reflux disease)    Hematuria    Hiatal hernia    with reflux   Hypertension    stress test 11/2010 normal   Kidney stone    Obesity    PVC's (premature  ventricular contractions)    Controlled on beta blocker therapy;  Echo 9/10: EF 55-60%, grade 1 diastolic dysfunction, mild MR, mild LAE   Thyroid nodule    hx   Tinea corporis    Unspecified contraceptive management    Urinary tract infection    Vaginal Pap smear, abnormal    Past Surgical History:  Procedure Laterality Date   leep surgery     mid 20's   TONSILLECTOMY     Social History   Tobacco Use   Smoking status: Former    Types: Cigarettes    Quit date: 06/16/2018    Years since quitting: 3.6   Smokeless tobacco: Never  Vaping Use   Vaping Use: Never used  Substance Use Topics   Alcohol use: No    Alcohol/week: 0.0 standard drinks of alcohol   Drug use: No   Social History   Socioeconomic History   Marital status: Single    Spouse name: Not on file   Number of children: Not on file   Years of education: Not on file   Highest education level: Not on file  Occupational History   Occupation: post office    Employer: [employer]  Tobacco Use   Smoking status: Former    Types: Cigarettes    Quit date: 06/16/2018    Years since quitting: 3.6   Smokeless tobacco: Never  Vaping Use   Vaping Use: Never used  Substance and Sexual Activity   Alcohol use: No    Alcohol/week: 0.0 standard drinks of alcohol   Drug use: No   Sexual activity: Not Currently    Partners: Male  Other Topics Concern   Not on file  Social History Narrative   Exercising---walking dog daily   Social Determinants of Health   Financial Resource Strain: Not on file  Food Insecurity: Not on file  Transportation Needs: Not on file  Physical Activity: Not on file  Stress: Not on file  Social Connections: Not on file  Intimate Partner Violence: Not on file   Family Status  Relation Name Status   Mother  Alive   Father  Alive   Brother  Alive   Mat Uncle  (Not Specified)   MGM  (Not Specified)   PGM  (Not Specified)   Other  (Not Specified)   Neg Hx  (Not Specified)   Family  History  Problem Relation Age of Onset   Diabetes Mother    Liver disease Mother    Irritable bowel syndrome Mother    Colon polyps Mother    Thyroid disease Mother    Hypertension Mother    Breast cancer Mother 24   Hyperlipidemia Father    Hypertension Father    Lung cancer Maternal Uncle    Lung cancer Maternal Grandmother    Heart attack Maternal Grandmother 48   Diabetes Maternal Grandmother    Lung cancer Paternal Grandmother    Coronary artery disease Other        female 1st degree relative 54 yo   Hypertension Other    Colon cancer Neg Hx    Stomach cancer Neg Hx    Allergies  Allergen Reactions  Ace Inhibitors Other (See Comments)    Numbness, tingling of lips.   Bee Venom Other (See Comments)    Leg swelling   Lisinopril     Other reaction(s): Other (See Comments) other   Piroxicam Other (See Comments)    REACTION: hives REACTION: hives      Review of Systems  Constitutional:  Negative for fever and malaise/fatigue.  HENT:  Negative for congestion.   Eyes:  Negative for blurred vision.  Respiratory:  Negative for shortness of breath.   Cardiovascular:  Negative for chest pain, palpitations and leg swelling.  Gastrointestinal:  Negative for abdominal pain, blood in stool and nausea.  Genitourinary:  Negative for dysuria and frequency.  Musculoskeletal:  Negative for falls.  Skin:  Negative for rash.  Neurological:  Negative for dizziness, loss of consciousness and headaches.  Endo/Heme/Allergies:  Negative for environmental allergies.  Psychiatric/Behavioral:  Negative for depression. The patient is not nervous/anxious.       Objective:     BP 108/78 (BP Location: Left Arm, Patient Position: Sitting, Cuff Size: Large)   Pulse 73   Temp 98.1 F (36.7 C) (Oral)   Resp 18   Ht 5\' 5"  (1.651 m)   Wt 191 lb 6.4 oz (86.8 kg)   SpO2 98%   BMI 31.85 kg/m  BP Readings from Last 3 Encounters:  02/05/22 108/78  01/09/22 108/68  07/24/21 124/86   Wt  Readings from Last 3 Encounters:  02/05/22 191 lb 6.4 oz (86.8 kg)  01/09/22 199 lb (90.3 kg)  07/24/21 226 lb (102.5 kg)   SpO2 Readings from Last 3 Encounters:  02/05/22 98%  01/09/22 98%  07/24/21 98%      Physical Exam Vitals and nursing note reviewed.  Constitutional:      Appearance: She is well-developed.  HENT:     Head: Normocephalic and atraumatic.  Eyes:     Conjunctiva/sclera: Conjunctivae normal.  Neck:     Thyroid: No thyromegaly.     Vascular: No carotid bruit or JVD.  Cardiovascular:     Rate and Rhythm: Normal rate and regular rhythm.     Heart sounds: Normal heart sounds. No murmur heard. Pulmonary:     Effort: Pulmonary effort is normal. No respiratory distress.     Breath sounds: Normal breath sounds. No wheezing or rales.  Chest:     Chest wall: No tenderness.  Musculoskeletal:     Cervical back: Normal range of motion and neck supple.  Neurological:     Mental Status: She is alert and oriented to person, place, and time.  Psychiatric:        Mood and Affect: Mood normal.        Behavior: Behavior normal.        Thought Content: Thought content normal.        Judgment: Judgment normal.      No results found for any visits on 02/05/22.  Last CBC Lab Results  Component Value Date   WBC 5.9 07/24/2021   HGB 13.2 07/24/2021   HCT 39.3 07/24/2021   MCV 94.4 07/24/2021   MCH 31.9 08/30/2015   RDW 13.8 07/24/2021   PLT 261.0 07/24/2021   Last metabolic panel Lab Results  Component Value Date   GLUCOSE 129 (H) 10/28/2021   NA 139 10/28/2021   K 4.0 10/28/2021   CL 102 10/28/2021   CO2 27 10/28/2021   BUN 15 10/28/2021   CREATININE 0.91 10/28/2021   GFRNONAA >60 08/30/2015  CALCIUM 9.9 10/28/2021   PROT 6.4 10/28/2021   ALBUMIN 4.3 10/28/2021   BILITOT 0.5 10/28/2021   ALKPHOS 51 10/28/2021   AST 30 10/28/2021   ALT 53 (H) 10/28/2021   ANIONGAP 8 08/30/2015   Last lipids Lab Results  Component Value Date   CHOL 113  10/28/2021   HDL 42.20 10/28/2021   LDLCALC 53 10/28/2021   LDLDIRECT 148.1 01/20/2008   TRIG 92.0 10/28/2021   CHOLHDL 3 10/28/2021   Last hemoglobin A1c Lab Results  Component Value Date   HGBA1C 6.3 10/28/2021   Last thyroid functions Lab Results  Component Value Date   TSH 0.82 07/24/2021   Last vitamin D Lab Results  Component Value Date   VD25OH 48.01 07/24/2021   Last vitamin B12 and Folate Lab Results  Component Value Date   VITAMINB12 731 12/28/2019   FOLATE >24.8 12/28/2019      The ASCVD Risk score (Arnett DK, et al., 2019) failed to calculate for the following reasons:   The valid total cholesterol range is 130 to 320 mg/dL    Assessment & Plan:   Problem List Items Addressed This Visit       Unprioritized   PAD (peripheral artery disease) (HCC)   Morbid obesity (HCC)   Depression, major, single episode, mild (HCC)   Uncontrolled type 2 diabetes mellitus with hyperglycemia (HCC)    hgba1c to be checked , minimize simple carbs. Increase exercise as tolerated. Continue current meds       Primary hypertension    Well controlled, no changes to meds. Encouraged heart healthy diet such as the DASH diet and exercise as tolerated.       Hyperlipidemia associated with type 2 diabetes mellitus (HCC)    Encourage heart healthy diet such as MIND or DASH diet, increase exercise, avoid trans fats, simple carbohydrates and processed foods, consider a krill or fish or flaxseed oil cap daily.       Hyperlipidemia    Encourage heart healthy diet such as MIND or DASH diet, increase exercise, avoid trans fats, simple carbohydrates and processed foods, consider a krill or fish or flaxseed oil cap daily.       Other Visit Diagnoses     Type 2 diabetes mellitus with hyperglycemia, without long-term current use of insulin (HCC)    -  Primary   Relevant Orders   CBC with Differential/Platelet   Comprehensive metabolic panel   Hemoglobin A1c   Insulin, random    Essential hypertension       Relevant Orders   CBC with Differential/Platelet   Comprehensive metabolic panel   Lipid panel   Hemoglobin A1c   Hyperlipidemia LDL goal <100       Relevant Orders   Comprehensive metabolic panel   Lipid panel   Multiple thyroid nodules       Relevant Orders   TSH   Vitamin D deficiency       Relevant Orders   VITAMIN D 25 Hydroxy (Vit-D Deficiency, Fractures)       No follow-ups on file.    Donato Schultz, DO

## 2022-02-05 NOTE — Assessment & Plan Note (Signed)
hgba1c to be checked, minimize simple carbs. Increase exercise as tolerated. Continue current meds  

## 2022-02-06 ENCOUNTER — Encounter: Payer: Self-pay | Admitting: Family Medicine

## 2022-02-06 ENCOUNTER — Ambulatory Visit: Payer: Federal, State, Local not specified - PPO | Admitting: Family Medicine

## 2022-02-06 LAB — COMPREHENSIVE METABOLIC PANEL
ALT: 26 U/L (ref 0–35)
AST: 18 U/L (ref 0–37)
Albumin: 4.2 g/dL (ref 3.5–5.2)
Alkaline Phosphatase: 57 U/L (ref 39–117)
BUN: 17 mg/dL (ref 6–23)
CO2: 26 mEq/L (ref 19–32)
Calcium: 9.8 mg/dL (ref 8.4–10.5)
Chloride: 101 mEq/L (ref 96–112)
Creatinine, Ser: 0.85 mg/dL (ref 0.40–1.20)
GFR: 77.73 mL/min (ref >60.00)
Glucose, Bld: 97 mg/dL (ref 70–99)
Potassium: 4.1 mEq/L (ref 3.5–5.1)
Sodium: 138 mEq/L (ref 135–145)
Total Bilirubin: 0.5 mg/dL (ref 0.2–1.2)
Total Protein: 6.9 g/dL (ref 6.0–8.3)

## 2022-02-06 LAB — CBC WITH DIFFERENTIAL/PLATELET
Basophils Absolute: 0.1 10*3/uL (ref 0.0–0.1)
Basophils Relative: 0.9 % (ref 0.0–3.0)
Eosinophils Absolute: 0.1 10*3/uL (ref 0.0–0.7)
Eosinophils Relative: 0.9 % (ref 0.0–5.0)
HCT: 42 % (ref 36.0–46.0)
Hemoglobin: 14 g/dL (ref 12.0–15.0)
Lymphocytes Relative: 22.5 % (ref 12.0–46.0)
Lymphs Abs: 2.1 10*3/uL (ref 0.7–4.0)
MCHC: 33.4 g/dL (ref 30.0–36.0)
MCV: 94.5 fl (ref 78.0–100.0)
Monocytes Absolute: 0.6 10*3/uL (ref 0.1–1.0)
Monocytes Relative: 6.2 % (ref 3.0–12.0)
Neutro Abs: 6.4 10*3/uL (ref 1.4–7.7)
Neutrophils Relative %: 69.5 % (ref 43.0–77.0)
Platelets: 294 10*3/uL (ref 150.0–400.0)
RBC: 4.45 Mil/uL (ref 3.87–5.11)
RDW: 13.3 % (ref 11.5–15.5)
WBC: 9.2 10*3/uL (ref 4.0–10.5)

## 2022-02-06 LAB — TSH: TSH: 0.74 u[IU]/mL (ref 0.35–5.50)

## 2022-02-06 LAB — LIPID PANEL
Cholesterol: 130 mg/dL (ref 0–200)
HDL: 51 mg/dL (ref >39.00)
LDL Cholesterol: 56 mg/dL (ref 0–99)
NonHDL: 79.2
Total CHOL/HDL Ratio: 3
Triglycerides: 116 mg/dL (ref 0.0–149.0)
VLDL: 23.2 mg/dL (ref 0.0–40.0)

## 2022-02-06 LAB — INSULIN, RANDOM: Insulin: 50.7 u[IU]/mL — ABNORMAL HIGH

## 2022-02-06 LAB — HEMOGLOBIN A1C: Hgb A1c MFr Bld: 6 % (ref 4.6–6.5)

## 2022-02-06 LAB — VITAMIN D 25 HYDROXY (VIT D DEFICIENCY, FRACTURES): VITD: 46.26 ng/mL (ref 30.00–100.00)

## 2022-02-10 DIAGNOSIS — M542 Cervicalgia: Secondary | ICD-10-CM | POA: Diagnosis not present

## 2022-03-04 DIAGNOSIS — M5412 Radiculopathy, cervical region: Secondary | ICD-10-CM | POA: Diagnosis not present

## 2022-03-09 ENCOUNTER — Ambulatory Visit: Payer: Federal, State, Local not specified - PPO | Admitting: Family Medicine

## 2022-03-16 ENCOUNTER — Other Ambulatory Visit (HOSPITAL_BASED_OUTPATIENT_CLINIC_OR_DEPARTMENT_OTHER): Payer: Self-pay

## 2022-03-17 ENCOUNTER — Other Ambulatory Visit (HOSPITAL_BASED_OUTPATIENT_CLINIC_OR_DEPARTMENT_OTHER): Payer: Self-pay

## 2022-03-17 ENCOUNTER — Encounter: Payer: Self-pay | Admitting: Family Medicine

## 2022-03-17 ENCOUNTER — Ambulatory Visit: Payer: Federal, State, Local not specified - PPO | Admitting: Family Medicine

## 2022-03-17 VITALS — BP 116/78 | HR 77 | Temp 98.1°F | Resp 12 | Ht 65.0 in | Wt 190.4 lb

## 2022-03-17 DIAGNOSIS — M546 Pain in thoracic spine: Secondary | ICD-10-CM

## 2022-03-17 DIAGNOSIS — Z01818 Encounter for other preprocedural examination: Secondary | ICD-10-CM | POA: Diagnosis not present

## 2022-03-17 DIAGNOSIS — R739 Hyperglycemia, unspecified: Secondary | ICD-10-CM | POA: Diagnosis not present

## 2022-03-17 DIAGNOSIS — E1165 Type 2 diabetes mellitus with hyperglycemia: Secondary | ICD-10-CM

## 2022-03-17 MED ORDER — SEMAGLUTIDE (2 MG/DOSE) 8 MG/3ML ~~LOC~~ SOPN
2.0000 mg | PEN_INJECTOR | SUBCUTANEOUS | 3 refills | Status: DC
  Filled 2022-03-17: qty 3, 28d supply, fill #0
  Filled 2022-05-06: qty 3, 28d supply, fill #1
  Filled 2022-06-09: qty 3, 28d supply, fill #2
  Filled 2022-07-08: qty 3, 28d supply, fill #3

## 2022-03-17 MED ORDER — METFORMIN HCL 1000 MG PO TABS: 1000.0000 mg | ORAL_TABLET | Freq: Two times a day (BID) | ORAL | 3 refills | Status: DC

## 2022-03-17 MED ORDER — SEMAGLUTIDE (2 MG/DOSE) 8 MG/3ML ~~LOC~~ SOPN: 2.0000 mg | PEN_INJECTOR | SUBCUTANEOUS | 3 refills | Status: DC

## 2022-03-17 NOTE — Progress Notes (Signed)
Subjective:   By signing my name below, I, Kellie Simmering, attest that this documentation has been prepared under the direction and in the presence of Ann Held, DO., 03/17/2022.     Patient ID: [name] Ramos, female    DOB: 1965-06-25, 56 y.o.   MRN: [id number]  Chief Complaint  Patient presents with   surgical clearance    Neck surgery    HPI Patient is in today for an office visit.  ACDF: She visited her orthopedic surgeon, Dr. Rolena Infante, at [facility] on 03/04/2022 and is looking to be cleared for an ACDF on 06/04/2021. She reports that she is experiencing bilateral arm numbness and feels that her shoulders are being pushed down.  Immunizations: She chooses not to receive the Flu immunization today.   Glucose/Weight Management: She is currently taking Ozempic 1 mg to manage her weight and reports that she has lost 45 lbs since 07/2021. She states that she is tolerating the medication but has been experiencing diarrhea and nausea while taking it. She further states that she has been exercising regularly at the gym to reduce her weight. She is also currently taking Metformin 500 mg twice daily to manage her blood sugar. Wt Readings from Last 3 Encounters:  03/17/22 190 lb 6.4 oz (86.4 kg)  02/05/22 191 lb 6.4 oz (86.8 kg)  01/09/22 199 lb (90.3 kg)   Past Medical History:  Diagnosis Date   Alcohol abuse    recovering alcoholic   Allergic urticaria    Anxiety    Cholecystitis, acute    Congenital anomaly of neck    Depression    Dyslipidemia    Followed by PCP   Gastroparesis    GERD (gastroesophageal reflux disease)    Hematuria    Hiatal hernia    with reflux   Hypertension    stress test 11/2010 normal   Kidney stone    Obesity    PVC's (premature ventricular contractions)    Controlled on beta blocker therapy;  Echo 9/10: EF 95-09%, grade 1 diastolic dysfunction, mild MR, mild LAE   Thyroid nodule    hx   Tinea corporis    Unspecified contraceptive  management    Urinary tract infection    Vaginal Pap smear, abnormal    Past Surgical History:  Procedure Laterality Date   leep surgery     mid 20's   TONSILLECTOMY     Family History  Problem Relation Age of Onset   Diabetes Mother    Liver disease Mother    Irritable bowel syndrome Mother    Colon polyps Mother    Thyroid disease Mother    Hypertension Mother    Breast cancer Mother 29   Hyperlipidemia Father    Hypertension Father    Lung cancer Maternal Uncle    Lung cancer Maternal Grandmother    Heart attack Maternal Grandmother 4   Diabetes Maternal Grandmother    Lung cancer Paternal Grandmother    Coronary artery disease Other        female 1st degree relative 54 yo   Hypertension Other    Colon cancer Neg Hx    Stomach cancer Neg Hx    Social History   Socioeconomic History   Marital status: Single    Spouse name: Not on file   Number of children: Not on file   Years of education: Not on file   Highest education level: Not on file  Occupational History   Occupation: post office  Employer: [employer]  Tobacco Use   Smoking status: Former    Types: Cigarettes    Quit date: 06/16/2018    Years since quitting: 3.7   Smokeless tobacco: Never  Vaping Use   Vaping Use: Never used  Substance and Sexual Activity   Alcohol use: No    Alcohol/week: 0.0 standard drinks of alcohol   Drug use: No   Sexual activity: Not Currently    Partners: Male  Other Topics Concern   Not on file  Social History Narrative   Exercising---walking dog daily   Social Determinants of Health   Financial Resource Strain: Not on file  Food Insecurity: Not on file  Transportation Needs: Not on file  Physical Activity: Not on file  Stress: Not on file  Social Connections: Not on file  Intimate Partner Violence: Not on file   Outpatient Medications Prior to Visit  Medication Sig Dispense Refill   albuterol (VENTOLIN HFA) 108 (90 Base) MCG/ACT inhaler Inhale 2 puffs  into the lungs every 6 (six) hours as needed for wheezing or shortness of breath. 8 g 0   ALPRAZolam (XANAX) 0.25 MG tablet Take 1 tablet (0.25 mg total) by mouth 3 (three) times daily as needed for anxiety. 30 tablet 2   amLODipine (NORVASC) 5 MG tablet Take 1 tablet (5 mg total) by mouth daily. 90 tablet 3   aspirin 81 MG tablet Take 81 mg by mouth daily.     atenolol (TENORMIN) 100 MG tablet Take 1 tablet (100 mg total) by mouth daily. 90 tablet 3   atorvastatin (LIPITOR) 10 MG tablet Take 1 tablet (10 mg total) by mouth daily. 90 tablet 3   blood glucose meter kit and supplies Dispense based on patient and insurance preference. Use up to four times daily as directed. (FOR ICD-10 E10.9, E11.9). 1 each 0   clobetasol ointment (TEMOVATE) 0.05 % Apply to affected area every night for 4 weeks, then every other day for 4 weeks and then twice a week 60 g 5   EPINEPHrine 0.3 mg/0.3 mL IJ SOAJ injection Inject 0.3 mg into the muscle as needed for anaphylaxis. 2 each 1   Ergocalciferol (VITAMIN D2 PO) Take 1,000 mg by mouth daily.     gabapentin (NEURONTIN) 300 MG capsule Take 300 mg by mouth as needed.     hydrochlorothiazide (HYDRODIURIL) 25 MG tablet Take 1 tablet (25 mg total) by mouth daily. 90 tablet 3   methocarbamol (ROBAXIN) 500 MG tablet Take 1 tablet (500 mg total) by mouth every 6 (six) hours as needed for muscle spasms. 30 tablet 1   omeprazole (PRILOSEC) 40 MG capsule TAKE 1 CAPSULE BY MOUTH EVERY DAY 90 capsule 3   metFORMIN (GLUCOPHAGE) 500 MG tablet Take 1 tablet (500 mg total) by mouth 2 (two) times daily with a meal. 180 tablet 1   Semaglutide, 1 MG/DOSE, 4 MG/3ML SOPN Inject 1 mg into the skin once a week. 3 mL 3   medroxyPROGESTERone Acetate 150 MG/ML SUSY Inject 1 mL (150 mg total) as directed every 3 (three) months for 1 day. 1 mL 2   predniSONE (DELTASONE) 5 MG tablet Take 1 tablet by mouth as needed.     No facility-administered medications prior to visit.   Allergies   Allergen Reactions   Ace Inhibitors Other (See Comments)    Numbness, tingling of lips.   Bee Venom Other (See Comments)    Leg swelling   Lisinopril     Other reaction(s):  Other (See Comments) other   Piroxicam Other (See Comments)    REACTION: hives REACTION: hives   Review of Systems  Constitutional:  Negative for fever and malaise/fatigue.  HENT:  Negative for congestion.   Eyes:  Negative for blurred vision.  Respiratory:  Negative for shortness of breath.   Cardiovascular:  Negative for chest pain, palpitations and leg swelling.  Gastrointestinal:  Negative for abdominal pain, blood in stool and nausea.  Genitourinary:  Negative for dysuria and frequency.  Musculoskeletal:  Negative for falls.  Skin:  Negative for rash.  Neurological:  Negative for dizziness, loss of consciousness and headaches.  Endo/Heme/Allergies:  Negative for environmental allergies.  Psychiatric/Behavioral:  Negative for depression. The patient is not nervous/anxious.       Objective:    Physical Exam Vitals and nursing note reviewed.  Constitutional:      General: She is not in acute distress.    Appearance: Normal appearance. She is not ill-appearing.  HENT:     Head: Normocephalic and atraumatic.     Right Ear: External ear normal.     Left Ear: External ear normal.  Eyes:     Extraocular Movements: Extraocular movements intact.     Pupils: Pupils are equal, round, and reactive to light.  Cardiovascular:     Rate and Rhythm: Normal rate and regular rhythm.     Pulses: Normal pulses.     Heart sounds: Normal heart sounds. No murmur heard.    No gallop.  Pulmonary:     Effort: Pulmonary effort is normal. No respiratory distress.     Breath sounds: Normal breath sounds. No wheezing or rales.  Neurological:     Mental Status: She is alert and oriented to person, place, and time.  Psychiatric:        Mood and Affect: Mood normal.        Behavior: Behavior normal.        Judgment:  Judgment normal.    BP 116/78 (BP Location: Right Arm, Cuff Size: Normal)   Pulse 77   Temp 98.1 F (36.7 C) (Oral)   Resp 12   Ht _0  (1.651 m)   Wt 190 lb 6.4 oz (86.4 kg)   SpO2 96%   BMI 31.68 kg/m  Wt Readings from Last 3 Encounters:  03/17/22 190 lb 6.4 oz (86.4 kg)  02/05/22 191 lb 6.4 oz (86.8 kg)  01/09/22 199 lb (90.3 kg)   Diabetic Foot Exam - Simple   No data filed    Lab Results  Component Value Date   WBC 9.2 02/05/2022   HGB 14.0 02/05/2022   HCT 42.0 02/05/2022   PLT 294.0 02/05/2022   GLUCOSE 97 02/05/2022   CHOL 130 02/05/2022   TRIG 116.0 02/05/2022   HDL 51.00 02/05/2022   LDLDIRECT 148.1 01/20/2008   LDLCALC 56 02/05/2022   ALT 26 02/05/2022   AST 18 02/05/2022   NA 138 02/05/2022   K 4.1 02/05/2022   CL 101 02/05/2022   CREATININE 0.85 02/05/2022   BUN 17 02/05/2022   CO2 26 02/05/2022   TSH 0.74 02/05/2022   HGBA1C 6.0 02/05/2022   MICROALBUR 1.8 10/28/2021   Lab Results  Component Value Date   TSH 0.74 02/05/2022   Lab Results  Component Value Date   WBC 9.2 02/05/2022   HGB 14.0 02/05/2022   HCT 42.0 02/05/2022   MCV 94.5 02/05/2022   PLT 294.0 02/05/2022   Lab Results  Component Value Date  NA 138 02/05/2022   K 4.1 02/05/2022   CO2 26 02/05/2022   GLUCOSE 97 02/05/2022   BUN 17 02/05/2022   CREATININE 0.85 02/05/2022   BILITOT 0.5 02/05/2022   ALKPHOS 57 02/05/2022   AST 18 02/05/2022   ALT 26 02/05/2022   PROT 6.9 02/05/2022   ALBUMIN 4.2 02/05/2022   CALCIUM 9.8 02/05/2022   ANIONGAP 8 08/30/2015   GFR 77.73 02/05/2022   Lab Results  Component Value Date   CHOL 130 02/05/2022   Lab Results  Component Value Date   HDL 51.00 02/05/2022   Lab Results  Component Value Date   LDLCALC 56 02/05/2022   Lab Results  Component Value Date   TRIG 116.0 02/05/2022   Lab Results  Component Value Date   CHOLHDL 3 02/05/2022   Lab Results  Component Value Date   HGBA1C 6.0 02/05/2022      Assessment &  Plan:   Problem List Items Addressed This Visit       Unprioritized   Hyperglycemia   Uncontrolled type 2 diabetes mellitus with hyperglycemia (HCC)   Relevant Medications   metFORMIN (GLUCOPHAGE) 1000 MG tablet   Semaglutide, 2 MG/DOSE, 8 MG/3ML SOPN   Type 2 diabetes mellitus with hyperglycemia, without long-term current use of insulin (HCC)   Relevant Medications   metFORMIN (GLUCOPHAGE) 1000 MG tablet   Semaglutide, 2 MG/DOSE, 8 MG/3ML SOPN   Thoracic spine pain   Preop examination - Primary    Check labs  Cleared for surgery as long as cards clears her      Meds ordered this encounter  Medications   DISCONTD: Semaglutide, 2 MG/DOSE, 8 MG/3ML SOPN    Sig: Inject 2 mg as directed once a week.    Dispense:  3 mL    Refill:  3   metFORMIN (GLUCOPHAGE) 1000 MG tablet    Sig: Take 1 tablet (1,000 mg total) by mouth 2 (two) times daily with a meal.    Dispense:  180 tablet    Refill:  3   Semaglutide, 2 MG/DOSE, 8 MG/3ML SOPN    Sig: Inject 2 mg as directed once a week.    Dispense:  3 mL    Refill:  3   I, Ann Held, DO, personally preformed the services described in this documentation.  All medical record entries made by the scribe were at my direction and in my presence.  I have reviewed the chart and discharge instructions (if applicable) and agree that the record reflects my personal performance and is accurate and complete. 03/17/2022  I,Mohammed Iqbal,acting as a scribe for Ann Held, DO.,have documented all relevant documentation on the behalf of Ann Held, DO,as directed by  Ann Held, DO while in the presence of Ann Held, DO.  Ann Held, DO

## 2022-03-17 NOTE — Patient Instructions (Signed)
Cervical Fusion Cervical fusion is a procedure that is done on bones in the neck to relieve pressure on the spinal cord or on one or more nerve roots. The bones in the neck form the cervical spine. There are two types of cervical fusion: Posterior cervical fusion. This surgery is done through the back, or posterior, of the neck. The goal of this procedure is to join two or more of the bones in the spine (vertebrae). This procedure is most commonly used to treat neck fractures and dislocations and to fix deformities in the neck. Anterior cervical fusion. This surgery is done through the front, or anterior, of the neck. This procedure is most commonly used to treat a herniated cervical disk, degenerative cervical disease, and arthritis in the cervical spine. Tell a health care provider about: Any allergies you have. All medicines you are taking, including vitamins, herbs, eye drops, creams, and over-the-counter medicines. Any problems you or family members have had with anesthetic medicines. Any blood disorders you have. Any surgeries you have had. Any medical conditions you have. Whether you are pregnant or may be pregnant. What are the risks? Generally, this is a safe procedure. However, problems may occur, including: Infection. Bleeding. Damage to nearby structures or organs, such as nerves near the spine. Blood clots. Temporary hoarseness of the voice. Difficulty swallowing. Rare complications include: Spinal fluid leakage. Trouble controlling urination or bowel movements. Vertebrae not fusing together completely (pseudoarthrosis). Allergic reactions to medicines or dyes. What happens before the procedure? Staying hydrated Follow instructions from your health care provider about hydration, which may include: Up to 2 hours before the procedure - you may continue to drink clear liquids, such as water, clear fruit juice, black coffee, and plain tea.  Eating and drinking  restrictions Follow instructions from your health care provider about eating and drinking, which may include: 8 hours before the procedure - stop eating heavy meals or foods, such as meat, fried foods, or fatty foods. 6 hours before the procedure - stop eating light meals or foods, such as toast or cereal. 6 hours before the procedure - stop drinking milk or drinks that contain milk. 2 hours before the procedure - stop drinking clear liquids. Medicines Ask your health care provider about: Changing or stopping your regular medicines. This is especially important if you are taking diabetes medicines or blood thinners. Taking medicines such as aspirin and ibuprofen. These medicines can thin your blood. Do not take these medicines unless your health care provider tells you to take them. Taking over-the-counter medicines, vitamins, herbs, and supplements. Tests You may have tests and imaging done. These include: Blood and urine tests. X-ray, CT scan, or MRI. Surgery safety Ask your health care provider: How your surgery site will be marked. What steps will be taken to help prevent infection. These may include: Removing hair at the surgery site. Washing skin with a germ-killing soap. Receiving antibiotic medicine. General instructions Do not use any products that contain nicotine or tobacco for at least 4 weeks before the procedure. These products include cigarettes, e-cigarettes, and chewing tobacco. If you need help quitting, ask your health care provider. Plan to have a responsible adult take you home from the hospital or clinic. Plan to have a responsible adult care for you for the time you are told after you leave the hospital or clinic. This is important. What happens during the procedure?  An IV will be inserted into one of your veins. You will be given one or more   of the following: A medicine to help you relax (sedative). A medicine to make you fall asleep (general anesthetic). If  you are having a posterior cervical fusion: An incision will be made in the back of your neck. Two or more vertebrae will be joined into one solid section of bone with a bone graft. If you are having an anterior cervical fusion: An incision will be made in the area where the fusion will be placed. This is usually done at the front of your neck. Your neck muscles will be pushed aside. The affected disk and bone spurs will be removed (decompression). The area where the disk was removed will then be filled with bone graft or bone implants or both. Screws and rods or metal plates may be used to stabilize the vertebrae while they fuse. Your incision may be closed with stitches (sutures). A bandage (dressing) may be placed over your incision. The procedure may vary among health care providers and hospitals. What happens after the procedure? Your blood pressure, heart rate, breathing rate, and blood oxygen level will be monitored until you leave the hospital or clinic. You will be given medicine to relieve pain as needed. You will continue to receive fluids and medicines through an IV. You may be given a brace to wear while you heal. Do not drive until your health care provider approves. You may have to wear compression stockings. These stockings help to prevent blood clots and reduce swelling in your legs. You may be asked to do breathing exercises. This helps prevent a lung infection. You will be encouraged to stand up and walk as soon as you are able. You will be taught how to move correctly and how to stand and walk. You will be assisted to turn in bed frequently by moving your whole body without twisting your back (log rolling technique). Summary Cervical fusion is a procedure that is done on bones in the neck. The procedure is done to relieve pressure on the spinal cord or on nerve roots. Tell your health care provider about any medical conditions you have and the medicines you are taking for  them. Follow your health care provider's instructions before the procedure. You will be told when to stop eating and drinking, and whether to change or stop your medicines. After the procedure, you may be asked to do breathing exercises. You may also be encouraged to start walking. Plan to have someone take you home from the hospital or clinic. Do not drive until your health care provider approves. This information is not intended to replace advice given to you by your health care provider. Make sure you discuss any questions you have with your health care provider. Document Revised: 09/06/2019 Document Reviewed: 09/06/2019 Elsevier Patient Education  2023 Elsevier Inc.  

## 2022-03-18 ENCOUNTER — Other Ambulatory Visit (HOSPITAL_BASED_OUTPATIENT_CLINIC_OR_DEPARTMENT_OTHER): Payer: Self-pay

## 2022-03-18 DIAGNOSIS — Z01818 Encounter for other preprocedural examination: Secondary | ICD-10-CM | POA: Insufficient documentation

## 2022-03-18 DIAGNOSIS — M546 Pain in thoracic spine: Secondary | ICD-10-CM | POA: Insufficient documentation

## 2022-03-18 DIAGNOSIS — E1165 Type 2 diabetes mellitus with hyperglycemia: Secondary | ICD-10-CM | POA: Insufficient documentation

## 2022-03-18 NOTE — Assessment & Plan Note (Signed)
Check labs  Cleared for surgery as long as cards clears her

## 2022-03-19 NOTE — Progress Notes (Signed)
Cardiology Office Note:    Date:  03/20/2022   ID:  [name] Ramos, DOB 12-16-1965, MRN [id number]  PCP:  [name] Ramos, [name] Ramos Providers Cardiologist:  [name] Lean, MD     Referring MD: [name] Ramos, [name] Ramos, *   CC:  DOD Pre-op eval (C spine) [name] Ramos  History of Present Illness:    [name] Ramos is a 56 y.o. female with a hx of LBBB, Fhx of CAD, PVCs well known to [name] Ramos.   Patient notes that she is doing well.   There are no interval hospital/ED visit.   Works at a post office.  Does yard work that is limited by her neck. Is back at the gym but is limited by her neck.  No chest pain or pressure .  No SOB/DOE and no PND/Orthopnea.  No weight gain or leg swelling.  No palpitations or syncope.  Past Medical History:  Diagnosis Date   Alcohol abuse    recovering alcoholic   Allergic urticaria    Anxiety    Cholecystitis, acute    Congenital anomaly of neck    Depression    Dyslipidemia    Followed by PCP   Gastroparesis    GERD (gastroesophageal reflux disease)    Hematuria    Hiatal hernia    with reflux   Hypertension    stress test 11/2010 normal   Kidney stone    Obesity    PVC's (premature ventricular contractions)    Controlled on beta blocker therapy;  Echo 9/10: EF 84-53%, grade 1 diastolic dysfunction, mild MR, mild LAE   Thyroid nodule    hx   Tinea corporis    Unspecified contraceptive management    Urinary tract infection    Vaginal Pap smear, abnormal     Past Surgical History:  Procedure Laterality Date   leep surgery     mid 20's   TONSILLECTOMY      Current Medications: Current Meds  Medication Sig   albuterol (VENTOLIN HFA) 108 (90 Base) MCG/ACT inhaler Inhale 2 puffs into the lungs every 6 (six) hours as needed for wheezing or shortness of breath.   ALPRAZolam (XANAX) 0.25 MG tablet Take 1 tablet (0.25 mg total) by mouth 3 (three) times daily as needed for anxiety.   amLODipine  (NORVASC) 5 MG tablet Take 1 tablet (5 mg total) by mouth daily.   aspirin 81 MG tablet Take 81 mg by mouth daily.   atenolol (TENORMIN) 100 MG tablet Take 1 tablet (100 mg total) by mouth daily.   atorvastatin (LIPITOR) 10 MG tablet Take 1 tablet (10 mg total) by mouth daily.   blood glucose meter kit and supplies Dispense based on patient and insurance preference. Use up to four times daily as directed. (FOR ICD-10 E10.9, E11.9).   clobetasol ointment (TEMOVATE) 0.05 % Apply to affected area every night for 4 weeks, then every other day for 4 weeks and then twice a week   EPINEPHrine 0.3 mg/0.3 mL IJ SOAJ injection Inject 0.3 mg into the muscle as needed for anaphylaxis.   Ergocalciferol (VITAMIN D2 PO) Take 1,000 mg by mouth daily.   gabapentin (NEURONTIN) 300 MG capsule Take 300 mg by mouth as needed.   hydrochlorothiazide (HYDRODIURIL) 25 MG tablet Take 1 tablet (25 mg total) by mouth daily.   medroxyPROGESTERone Acetate 150 MG/ML SUSY Inject 1 mL (150 mg total) as directed every 3 (three) months for 1 day.   metFORMIN (  GLUCOPHAGE) 1000 MG tablet Take 1 tablet (1,000 mg total) by mouth 2 (two) times daily with a meal.   methocarbamol (ROBAXIN) 500 MG tablet Take 1 tablet (500 mg total) by mouth every 6 (six) hours as needed for muscle spasms.   omeprazole (PRILOSEC) 40 MG capsule TAKE 1 CAPSULE BY MOUTH EVERY DAY   Semaglutide, 2 MG/DOSE, 8 MG/3ML SOPN Inject 2 mg as directed once a week.     Allergies:   Ace inhibitors, Bee venom, Lisinopril, and Piroxicam   Social History   Socioeconomic History   Marital status: Single    Spouse name: Not on file   Number of children: Not on file   Years of education: Not on file   Highest education level: Not on file  Occupational History   Occupation: post office    Employer: [employer]  Tobacco Use   Smoking status: Former    Types: Cigarettes    Quit date: 06/16/2018    Years since quitting: 3.7   Smokeless tobacco: Never  Vaping  Use   Vaping Use: Never used  Substance and Sexual Activity   Alcohol use: No    Alcohol/week: 0.0 standard drinks of alcohol   Drug use: No   Sexual activity: Not Currently    Partners: Male  Other Topics Concern   Not on file  Social History Narrative   Exercising---walking dog daily   Social Determinants of Health   Financial Resource Strain: Not on file  Food Insecurity: Not on file  Transportation Needs: Not on file  Physical Activity: Not on file  Stress: Not on file  Social Connections: Not on file    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 There is no history of Colon cancer or Stomach cancer.  ROS:   Please see the history of present illness.     All other systems reviewed and are negative.  EKGs/Labs/Other Studies Reviewed:    The following studies were reviewed today:  EKG:  EKG is  ordered today.  The ekg ordered today demonstrates  03/20/22: SR with LBBB  No results found for this or any previous visit from the past 3650 days.   GATED SPECT MYO PERF W/LEXISCAN STRESS 1D 09/05/2015  Narrative  Nuclear stress EF: 45%.  The left ventricular ejection fraction is mildly decreased (45-54%).  This is an intermediate risk study.  ECHO COMPLETE WO IMAGING ENHANCING AGENT 12/16/2020  Narrative ECHOCARDIOGRAM REPORT    Patient Name:   [name] Ramos Date of Exam: 12/16/2020 Medical Rec #:   [id number]       Height:       65.0 in Accession #:    [id number]      Weight:       217.4 lb Date of Birth:  [birth year]       BSA:          2.050 m Patient Age:    82 years        BP:  122/70 mmHg Patient Gender: F               HR:           62 bpm. Exam Location:  [address]  Procedure: 2D Echo, 3D Echo, Cardiac Doppler, Color Doppler and Strain Analysis  Indications:    I44.7 LBBB  History:        Patient has prior history of Echocardiogram examinations, most recent 09/06/2014. Arrythmias:PVC and LBBB; Risk Factors:Hypertension, Diabetes, Dyslipidemia, Former Smoker and Obesity. EtOH.  Sonographer:    Jessee Avers, RDCS Referring Phys: [place]   1. Left ventricular ejection fraction, by estimation, is 55 to 60%. Left ventricular ejection fraction by 3D volume is 56 %. The left ventricle has normal function. The left ventricle has no regional wall motion abnormalities. Left ventricular diastolic parameters are consistent with Grade I diastolic dysfunction (impaired relaxation). The average left ventricular global longitudinal strain is -24.9 %. The global longitudinal strain is normal. 2. Right ventricular systolic function is normal. The right ventricular size is normal. There is normal pulmonary artery systolic pressure. 3. The mitral valve is grossly normal. Mild mitral valve regurgitation. No evidence of mitral stenosis. 4. The aortic valve is tricuspid. Aortic valve regurgitation is not visualized. No aortic stenosis is present.  Comparison(s): Prior images unable to be directly viewed, comparison made by report only. Stress echo 09/06/14 EF 65% per report.  FINDINGS Left Ventricle: Left ventricular ejection fraction, by estimation, is 55 to 60%. Left ventricular ejection fraction by 3D volume is 56 %. The left ventricle has normal function. The left ventricle has no regional wall motion abnormalities. The average left ventricular global  longitudinal strain is -24.9 %. The global longitudinal strain is normal. The left ventricular internal cavity size was normal in size. There is no left ventricular hypertrophy. Left ventricular diastolic parameters are consistent with Grade I diastolic dysfunction (impaired relaxation).  Right Ventricle: The right ventricular size is normal. No increase in right ventricular wall thickness. Right ventricular systolic function is normal. There is normal pulmonary artery systolic pressure. The tricuspid regurgitant velocity is 2.73 m/s, and with an assumed right atrial pressure of 3 mmHg, the estimated right ventricular systolic pressure is 78.6 mmHg.  Left Atrium: Left atrial size was normal in size.  Right Atrium: Right atrial size was normal in size.  Pericardium: There is no evidence of pericardial effusion.  Mitral Valve: The mitral valve is grossly normal. Mild mitral valve regurgitation. No evidence of mitral valve stenosis.  Tricuspid Valve: The tricuspid valve is grossly normal. Tricuspid valve regurgitation is mild . No evidence of tricuspid stenosis.  Aortic Valve: The aortic valve is tricuspid. Aortic valve regurgitation is not visualized. No aortic stenosis is present.  Pulmonic Valve: The pulmonic valve was grossly normal. Pulmonic valve regurgitation is not visualized. No evidence of pulmonic stenosis.  Aorta: The aortic root and ascending aorta are structurally normal, with no evidence of dilitation.  IAS/Shunts: The atrial septum is grossly normal.   LEFT VENTRICLE PLAX 2D LVIDd:         4.90 cm         Diastology LVIDs:         3.50 cm         LV e' medial:    7.22 cm/s LV PW:         0.90 cm         LV E/e' medial:  9.9 LV IVS:  1.00 cm         LV e' lateral:   6.32 cm/s LVOT diam:     2.00 cm         LV E/e' lateral: 11.3 LV SV:         95 LV SV Index:   46              2D LVOT Area:     3.14 cm        Longitudinal Strain 2D Strain GLS  -28.3 % (A2C): 2D  Strain GLS  -20.1 % (A3C): 2D Strain GLS  -26.4 % (A4C): 2D Strain GLS  -24.9 % Avg:  3D Volume EF LV 3D EF:    Left ventricular ejection fraction by 3D volume is 56 %.  3D Volume EF: 3D EF:        56 % LV EDV:       172 ml LV ESV:       76 ml LV SV:        96 ml  RIGHT VENTRICLE RV Basal diam:  3.10 cm RV S prime:     11.90 cm/s TAPSE (M-mode): 2.7 cm RVSP:           32.8 mmHg  LEFT ATRIUM             Index       RIGHT ATRIUM           Index LA diam:        3.90 cm 1.90 cm/m  RA Pressure: 3.00 mmHg LA Vol (A2C):   35.1 ml 17.13 ml/m RA Area:     9.16 cm LA Vol (A4C):   43.1 ml 21.03 ml/m RA Volume:   16.10 ml  7.86 ml/m LA Biplane Vol: 40.0 ml 19.52 ml/m AORTIC VALVE LVOT Vmax:   113.00 cm/s LVOT Vmean:  76.800 cm/s LVOT VTI:    0.302 m  AORTA Ao Root diam: 2.80 cm Ao Asc diam:  3.60 cm  MITRAL VALVE               TRICUSPID VALVE TR Peak grad:   29.8 mmHg TR Vmax:        273.00 cm/s MV E velocity: 71.60 cm/s  Estimated RAP:  3.00 mmHg MV A velocity: 89.50 cm/s  RVSP:           32.8 mmHg MV E/A ratio:  0.80 SHUNTS Systemic VTI:  0.30 m Systemic Diam: 2.00 cm  Rudean Haskell MD Electronically signed by Rudean Haskell MD Signature Date/Time: 12/16/2020/10:37:30 AM     Recent Labs: 02/05/2022: ALT 26; BUN 17; Creatinine, Ser 0.85; Hemoglobin 14.0; Platelets 294.0; Potassium 4.1; Sodium 138; TSH 0.74  Recent Lipid Panel    Component Value Date/Time   CHOL 130 02/05/2022 1428   TRIG 116.0 02/05/2022 1428   HDL 51.00 02/05/2022 1428   CHOLHDL 3 02/05/2022 1428   VLDL 23.2 02/05/2022 1428   LDLCALC 56 02/05/2022 1428   LDLDIRECT 148.1 01/20/2008 0912    Physical Exam:    VS:  BP 122/68   Pulse 77   Ht _0  (1.651 m)   Wt 191 lb 3.2 oz (86.7 kg)   SpO2 99%   BMI 31.82 kg/m     Wt Readings from Last 3 Encounters:  03/20/22 191 lb 3.2 oz (86.7 kg)  03/17/22 190 lb 6.4 oz (86.4 kg)  02/05/22 191 lb 6.4 oz (86.8 kg)    Gen: no  distress  Neck: No  JVD Cardiac: No Rubs or Gallops, no murmur, RRR +2 radial pulses Respiratory: Clear to auscultation bilaterally, normal effort, normal  respiratory rate GI: Soft, nontender, non-distended  MS: No  edema;  moves all extremities Integument: Skin feels well Neuro:  At time of evaluation, alert and oriented to person/place/time/situation  Psych: Normal affect, patient feels well  ASSESSMENT:    1. Hyperlipidemia associated with type 2 diabetes mellitus ([place])   2. Left bundle branch block   3. PVC (premature ventricular contraction)   4. Primary hypertension   5. Hyperlipidemia, unspecified hyperlipidemia type   6. PAD (peripheral artery disease) (HCC)    PLAN:     Preoperative Risk Assessment - The Revised Cardiac Risk Index = 0; 0.4%: very low risk - DASI score of 23 associated with 6 functional mets - No further cardiac testing is recommended prior to surgery.  - The patient may proceed to surgery at acceptable risk.   - Our service is available as needed in the peri-operative period.    PVCs and HTN LBBB - no symptoms no change in therapy - continue BB  HLD with DM PAD NOS - continue atorvastatin - can hold ASA can hold metformin  Mild MR - Echo in 2025 (summer)  Former smoker - 2020  One year with me       Medication Adjustments/Labs and Tests Ordered: Current medicines are reviewed at length with the patient today.  Concerns regarding medicines are outlined above.  No orders of the defined types were placed in this encounter.  No orders of the defined types were placed in this encounter.   Patient Instructions  Medication Instructions:  Your physician recommends that you continue on your current medications as directed. Please refer to the Current Medication list given to you today.  *If you need a refill on your cardiac medications before your next appointment, please call your pharmacy*   Lab Work: NONE If you have labs (blood  work) drawn today and your tests are completely normal, you will receive your results only by: [place] (if you have MyChart) OR A paper copy in the mail If you have any lab test that is abnormal or we need to change your treatment, we will call you to review the results.   Testing/Procedures: NONE   Follow-Up: At [facility], you and your health needs are our priority.  As part of our continuing mission to provide you with exceptional heart care, we have created designated Provider Care Teams.  These Care Teams include your primary Cardiologist (physician) and Advanced Practice Providers (APPs -  Physician Assistants and Nurse Practitioners) who all work together to provide you with the care you need, when you need it.  We recommend signing up for the patient portal called "MyChart".  Sign up information is provided on this After Visit Summary.  MyChart is used to connect with patients for Virtual Visits (Telemedicine).  Patients are able to view lab/test results, encounter notes, upcoming appointments, etc.  Non-urgent messages can be sent to your provider as well.   To learn more about what you can do with MyChart, go to [email or web address].    Your next appointment:   1 year(s)  The format for your next appointment:   In Person  Provider:   Werner Lean, MD      Important Information About Sugar         Signed, [name] Lean, MD  03/20/2022 8:21 AM    [place]

## 2022-03-20 ENCOUNTER — Encounter: Payer: Self-pay | Admitting: Internal Medicine

## 2022-03-20 ENCOUNTER — Ambulatory Visit: Payer: Federal, State, Local not specified - PPO | Attending: Internal Medicine | Admitting: Internal Medicine

## 2022-03-20 VITALS — BP 122/68 | HR 77 | Ht 65.0 in | Wt 191.2 lb

## 2022-03-20 DIAGNOSIS — I447 Left bundle-branch block, unspecified: Secondary | ICD-10-CM | POA: Diagnosis not present

## 2022-03-20 DIAGNOSIS — I493 Ventricular premature depolarization: Secondary | ICD-10-CM | POA: Diagnosis not present

## 2022-03-20 DIAGNOSIS — E1169 Type 2 diabetes mellitus with other specified complication: Secondary | ICD-10-CM | POA: Diagnosis not present

## 2022-03-20 DIAGNOSIS — E785 Hyperlipidemia, unspecified: Secondary | ICD-10-CM

## 2022-03-20 DIAGNOSIS — I10 Essential (primary) hypertension: Secondary | ICD-10-CM

## 2022-03-20 DIAGNOSIS — I739 Peripheral vascular disease, unspecified: Secondary | ICD-10-CM

## 2022-03-20 NOTE — Patient Instructions (Signed)
Medication Instructions:  Your physician recommends that you continue on your current medications as directed. Please refer to the Current Medication list given to you today.  *If you need a refill on your cardiac medications before your next appointment, please call your pharmacy*   Lab Work: NONE If you have labs (blood work) drawn today and your tests are completely normal, you will receive your results only by: MyChart Message (if you have MyChart) OR A paper copy in the mail If you have any lab test that is abnormal or we need to change your treatment, we will call you to review the results.   Testing/Procedures: NONE   Follow-Up: At [facility], you and your health needs are our priority.  As part of our continuing mission to provide you with exceptional heart care, we have created designated Provider Care Teams.  These Care Teams include your primary Cardiologist (physician) and Advanced Practice Providers (APPs -  Physician Assistants and Nurse Practitioners) who all work together to provide you with the care you need, when you need it.  We recommend signing up for the patient portal called "MyChart".  Sign up information is provided on this After Visit Summary.  MyChart is used to connect with patients for Virtual Visits (Telemedicine).  Patients are able to view lab/test results, encounter notes, upcoming appointments, etc.  Non-urgent messages can be sent to your provider as well.   To learn more about what you can do with MyChart, go to [email or web address].    Your next appointment:   1 year(s)  The format for your next appointment:   In Person  Provider:   Mahesh A Chandrasekhar, MD     Important Information About Sugar       

## 2022-03-31 ENCOUNTER — Other Ambulatory Visit (HOSPITAL_BASED_OUTPATIENT_CLINIC_OR_DEPARTMENT_OTHER): Payer: Self-pay

## 2022-04-15 ENCOUNTER — Ambulatory Visit (HOSPITAL_COMMUNITY): Payer: Self-pay | Admitting: Orthopedic Surgery

## 2022-04-23 ENCOUNTER — Other Ambulatory Visit: Payer: Self-pay | Admitting: Family Medicine

## 2022-04-23 DIAGNOSIS — Z3009 Encounter for other general counseling and advice on contraception: Secondary | ICD-10-CM

## 2022-05-06 ENCOUNTER — Other Ambulatory Visit (HOSPITAL_BASED_OUTPATIENT_CLINIC_OR_DEPARTMENT_OTHER): Payer: Self-pay

## 2022-05-26 NOTE — Pre-Procedure Instructions (Signed)
Surgical Instructions    Your procedure is scheduled on Thursday, January 4th.  Report to [facility] Main Entrance "A" at 5:30 A.M., then check in with the Admitting office.  Call this number if you have problems the morning of surgery:  [phone]  If you have any questions prior to your surgery date call [phone]: Open Monday-Friday 8am-4pm If you experience any cold or flu symptoms such as cough, fever, chills, shortness of breath, etc. between now and your scheduled surgery, please notify us at the above number.     Remember:  Do not eat after midnight the night before your surgery  You may drink clear liquids until 4:30 a.m. the morning of your surgery.   Clear liquids allowed are: Water, Non-Citrus Juices (without pulp), Carbonated Beverages, Clear Tea, Black Coffee Only (NO MILK, CREAM OR POWDERED CREAMER of any kind), and Gatorade.    Take these medicines the morning of surgery with A SIP OF WATER  amLODipine (NORVASC)  atenolol (TENORMIN)  atorvastatin (LIPITOR)  omeprazole (PRILOSEC)    Take these medications as needed: methocarbamol (ROBAXIN)  ALPRAZolam (XANAX)   Follow your surgeon's instructions on when to stop Aspirin.  If no instructions were given by your surgeon then you will need to call the office to get those instructions.    As of today, STOP taking any Aleve, Naproxen, Ibuprofen, Motrin, Advil, Goody's, BC's, all herbal medications, fish oil, and all vitamins.  WHAT DO I DO ABOUT MY DIABETES MEDICATION?   Do not take metFORMIN (GLUCOPHAGE) the morning of surgery.  HOLD Semaglutide for 7 days prior to surgery.    HOW TO MANAGE YOUR DIABETES BEFORE AND AFTER SURGERY  Why is it important to control my blood sugar before and after surgery? Improving blood sugar levels before and after surgery helps healing and can limit problems. A way of improving blood sugar control is eating a healthy diet by:  Eating less sugar and carbohydrates  Increasing  activity/exercise  Talking with your doctor about reaching your blood sugar goals High blood sugars (greater than 180 mg/dL) can raise your risk of infections and slow your recovery, so you will need to focus on controlling your diabetes during the weeks before surgery. Make sure that the doctor who takes care of your diabetes knows about your planned surgery including the date and location.  How do I manage my blood sugar before surgery? Check your blood sugar at least 4 times a day, starting 2 days before surgery, to make sure that the level is not too high or low.  Check your blood sugar the morning of your surgery when you wake up and every 2 hours until you get to the Short Stay unit.  If your blood sugar is less than 70 mg/dL, you will need to treat for low blood sugar: Do not take insulin. Treat a low blood sugar (less than 70 mg/dL) with  cup of clear juice (cranberry or apple), 4 glucose tablets, OR glucose gel. Recheck blood sugar in 15 minutes after treatment (to make sure it is greater than 70 mg/dL). If your blood sugar is not greater than 70 mg/dL on recheck, call [phone] for further instructions. Report your blood sugar to the short stay nurse when you get to Short Stay.  If you are admitted to the hospital after surgery: Your blood sugar will be checked by the staff and you will probably be given insulin after surgery (instead of oral diabetes medicines) to make sure you have good  blood sugar levels. The goal for blood sugar control after surgery is 80-180 mg/dL.                      Do NOT Smoke (Tobacco/Vaping) for 24 hours prior to your procedure.  If you use a CPAP at night, you may bring your mask/headgear for your overnight stay.   Contacts, glasses, piercing's, hearing aid's, dentures or partials may not be worn into surgery, please bring cases for these belongings.    For patients admitted to the hospital, discharge time will be determined by your treatment  team.   Patients discharged the day of surgery will not be allowed to drive home, and someone needs to stay with them for 24 hours.  SURGICAL WAITING ROOM VISITATION Patients having surgery or a procedure may have no more than 2 support people in the waiting area - these visitors may rotate.   Children under the age of 71 must have an adult with them who is not the patient. If the patient needs to stay at the hospital during part of their recovery, the visitor guidelines for inpatient rooms apply. Pre-op nurse will coordinate an appropriate time for 1 support person to accompany patient in pre-op.  This support person may not rotate.   Please refer to the [facility] website for the visitor guidelines for Inpatients (after your surgery is over and you are in a regular room).    Special instructions:   [place]- Preparing For Surgery  Before surgery, you can play an important role. Because skin is not sterile, your skin needs to be as free of germs as possible. You can reduce the number of germs on your skin by washing with CHG (chlorahexidine gluconate) Soap before surgery.  CHG is an antiseptic cleaner which kills germs and bonds with the skin to continue killing germs even after washing.    Oral Hygiene is also important to reduce your risk of infection.  Remember - BRUSH YOUR TEETH THE MORNING OF SURGERY WITH YOUR REGULAR TOOTHPASTE  Please do not use if you have an allergy to CHG or antibacterial soaps. If your skin becomes reddened/irritated stop using the CHG.  Do not shave (including legs and underarms) for at least 48 hours prior to first CHG shower. It is OK to shave your face.  Please follow these instructions carefully.   Shower the NIGHT BEFORE SURGERY and the MORNING OF SURGERY  If you chose to wash your hair, wash your hair first as usual with your normal shampoo.  After you shampoo, rinse your hair and body thoroughly to remove the shampoo.  Use CHG Soap as you would  any other liquid soap. You can apply CHG directly to the skin and wash gently with a scrungie or a clean washcloth.   Apply the CHG Soap to your body ONLY FROM THE NECK DOWN.  Do not use on open wounds or open sores. Avoid contact with your eyes, ears, mouth and genitals (private parts). Wash Face and genitals (private parts)  with your normal soap.   Wash thoroughly, paying special attention to the area where your surgery will be performed.  Thoroughly rinse your body with warm water from the neck down.  DO NOT shower/wash with your normal soap after using and rinsing off the CHG Soap.  Pat yourself dry with a CLEAN TOWEL.  Wear CLEAN PAJAMAS to bed the night before surgery  Place CLEAN SHEETS on your bed the night before your surgery  DO NOT SLEEP WITH PETS.   Day of Surgery: Take a shower with CHG soap. Do not wear jewelry or makeup Do not wear lotions, powders, perfumes, or deodorant. Do not shave 48 hours prior to surgery.  Do not bring valuables to the hospital.  [facility] is not responsible for any belongings or valuables. Do not wear nail polish, gel polish, artificial nails, or any other type of covering on natural nails (fingers and toes) If you have artificial nails or gel coating that need to be removed by a nail salon, please have this removed prior to surgery. Artificial nails or gel coating may interfere with anesthesia's ability to adequately monitor your vital signs. Wear Clean/Comfortable clothing the morning of surgery Remember to brush your teeth WITH YOUR REGULAR TOOTHPASTE.   Please read over the following fact sheets that you were given.    If you received a COVID test during your pre-op visit  it is requested that you wear a mask when out in public, stay away from anyone that may not be feeling well and notify your surgeon if you develop symptoms. If you have been in contact with anyone that has tested positive in the last 10 days please notify you  surgeon.

## 2022-05-27 ENCOUNTER — Other Ambulatory Visit: Payer: Self-pay

## 2022-05-27 ENCOUNTER — Encounter (HOSPITAL_COMMUNITY)
Admission: RE | Admit: 2022-05-27 | Discharge: 2022-05-27 | Disposition: A | Discharge status: Home / Self Care | Payer: Federal, State, Local not specified - PPO | Source: Ambulatory Visit | Attending: Orthopedic Surgery | Admitting: Orthopedic Surgery

## 2022-05-27 ENCOUNTER — Encounter (HOSPITAL_COMMUNITY): Payer: Self-pay

## 2022-05-27 VITALS — BP 126/52 | HR 80 | Temp 97.4°F | Resp 17 | Ht 65.0 in | Wt 180.0 lb

## 2022-05-27 DIAGNOSIS — K219 Gastro-esophageal reflux disease without esophagitis: Secondary | ICD-10-CM | POA: Insufficient documentation

## 2022-05-27 DIAGNOSIS — E785 Hyperlipidemia, unspecified: Secondary | ICD-10-CM | POA: Diagnosis not present

## 2022-05-27 DIAGNOSIS — Z7985 Long-term (current) use of injectable non-insulin antidiabetic drugs: Secondary | ICD-10-CM | POA: Insufficient documentation

## 2022-05-27 DIAGNOSIS — Z01812 Encounter for preprocedural laboratory examination: Secondary | ICD-10-CM | POA: Insufficient documentation

## 2022-05-27 DIAGNOSIS — Z87891 Personal history of nicotine dependence: Secondary | ICD-10-CM | POA: Diagnosis not present

## 2022-05-27 DIAGNOSIS — M47812 Spondylosis without myelopathy or radiculopathy, cervical region: Secondary | ICD-10-CM | POA: Diagnosis not present

## 2022-05-27 DIAGNOSIS — E1143 Type 2 diabetes mellitus with diabetic autonomic (poly)neuropathy: Secondary | ICD-10-CM | POA: Diagnosis not present

## 2022-05-27 DIAGNOSIS — Z01818 Encounter for other preprocedural examination: Secondary | ICD-10-CM

## 2022-05-27 DIAGNOSIS — I10 Essential (primary) hypertension: Secondary | ICD-10-CM | POA: Diagnosis not present

## 2022-05-27 DIAGNOSIS — I447 Left bundle-branch block, unspecified: Secondary | ICD-10-CM | POA: Insufficient documentation

## 2022-05-27 DIAGNOSIS — M5412 Radiculopathy, cervical region: Secondary | ICD-10-CM | POA: Diagnosis not present

## 2022-05-27 DIAGNOSIS — Z7984 Long term (current) use of oral hypoglycemic drugs: Secondary | ICD-10-CM | POA: Diagnosis not present

## 2022-05-27 DIAGNOSIS — E1165 Type 2 diabetes mellitus with hyperglycemia: Secondary | ICD-10-CM

## 2022-05-27 HISTORY — DX: Left bundle-branch block, unspecified: I44.7

## 2022-05-27 HISTORY — DX: Type 2 diabetes mellitus without complications: E11.9

## 2022-05-27 LAB — TYPE AND SCREEN
ABO/RH(D): A NEG
Antibody Screen: NEGATIVE

## 2022-05-27 LAB — COMPREHENSIVE METABOLIC PANEL
ALT: 27 U/L (ref 0–44)
AST: 20 U/L (ref 15–41)
Albumin: 4.1 g/dL (ref 3.5–5.0)
Alkaline Phosphatase: 45 U/L (ref 38–126)
Anion gap: 8 (ref 5–15)
BUN: 10 mg/dL (ref 6–20)
CO2: 26 mmol/L (ref 22–32)
Calcium: 9.8 mg/dL (ref 8.9–10.3)
Chloride: 104 mmol/L (ref 98–111)
Creatinine, Ser: 0.74 mg/dL (ref 0.44–1.00)
GFR, Estimated: >60 mL/min (ref >60)
Glucose, Bld: 98 mg/dL (ref 70–99)
Potassium: 3.6 mmol/L (ref 3.5–5.1)
Sodium: 138 mmol/L (ref 135–145)
Total Bilirubin: 0.6 mg/dL (ref 0.3–1.2)
Total Protein: 6.9 g/dL (ref 6.5–8.1)

## 2022-05-27 LAB — HEMOGLOBIN A1C
Hgb A1c MFr Bld: 5.5 % (ref 4.8–5.6)
Mean Plasma Glucose: 111 mg/dL

## 2022-05-27 LAB — CBC
HCT: 43.3 % (ref 36.0–46.0)
Hemoglobin: 14.5 g/dL (ref 12.0–15.0)
MCH: 31.5 pg (ref 26.0–34.0)
MCHC: 33.5 g/dL (ref 30.0–36.0)
MCV: 94.1 fL (ref 80.0–100.0)
Platelets: 295 10*3/uL (ref 150–400)
RBC: 4.6 MIL/uL (ref 3.87–5.11)
RDW: 12.6 % (ref 11.5–15.5)
WBC: 8.6 10*3/uL (ref 4.0–10.5)
nRBC: 0 % (ref 0.0–0.2)

## 2022-05-27 LAB — GLUCOSE, CAPILLARY: Glucose-Capillary: 128 mg/dL — ABNORMAL HIGH (ref 70–99)

## 2022-05-27 NOTE — Progress Notes (Signed)
Surgical Instructions                 Your procedure is scheduled on Thursday, January 4th.             Report to [facility] Main Entrance "A" at 5:30 A.M., then check in with the Admitting office.             Call this number if you have problems the morning of surgery:             [phone]   If you have any questions prior to your surgery date call [phone]: Open Monday-Friday 8am-4pm If you experience any cold or flu symptoms such as cough, fever, chills, shortness of breath, etc. between now and your scheduled surgery, please notify us at the above number.                  Remember:             Do not eat after midnight the night before your surgery   You may drink clear liquids until 4:30 a.m. the morning of your surgery.   Clear liquids allowed are: Water, Non-Citrus Juices (without pulp), Carbonated Beverages, Clear Tea, Black Coffee Only (NO MILK, CREAM OR POWDERED CREAMER of any kind), and Gatorade.                          Take these medicines the morning of surgery with A SIP OF WATER  amLODipine (NORVASC)  atenolol (TENORMIN)  atorvastatin (LIPITOR)  omeprazole (PRILOSEC)                Take these medications as needed: methocarbamol (ROBAXIN)  ALPRAZolam (XANAX)    Follow your surgeon's instructions on when to stop Aspirin.  If no instructions were given by your surgeon then you will need to call the office to get those instructions.     As of today, STOP taking any Aleve, Naproxen, Ibuprofen, Motrin, Advil, Goody's, BC's, all herbal medications, fish oil, and all vitamins.   WHAT DO I DO ABOUT MY DIABETES MEDICATION?     Do not take metFORMIN (GLUCOPHAGE) the morning of surgery.   HOLD Semaglutide for 7 days prior to surgery. DO NOT TAKE on Sunday 05/31/2022     HOW TO MANAGE YOUR DIABETES BEFORE AND AFTER SURGERY   Why is it important to control my blood sugar before and after surgery? Improving blood sugar levels before and after surgery helps  healing and can limit problems. A way of improving blood sugar control is eating a healthy diet by:  Eating less sugar and carbohydrates  Increasing activity/exercise  Talking with your doctor about reaching your blood sugar goals High blood sugars (greater than 180 mg/dL) can raise your risk of infections and slow your recovery, so you will need to focus on controlling your diabetes during the weeks before surgery. Make sure that the doctor who takes care of your diabetes knows about your planned surgery including the date and location.   How do I manage my blood sugar before surgery? Check your blood sugar at least 4 times a day, starting 2 days before surgery, to make sure that the level is not too high or low.   Check your blood sugar the morning of your surgery when you wake up and every 2 hours until you get to the Short Stay unit.   If your blood sugar is less than 70 mg/dL, you will  need to treat for low blood sugar: Do not take insulin. Treat a low blood sugar (less than 70 mg/dL) with  cup of clear juice (cranberry or apple), 4 glucose tablets, OR glucose gel. Recheck blood sugar in 15 minutes after treatment (to make sure it is greater than 70 mg/dL). If your blood sugar is not greater than 70 mg/dL on recheck, call [phone] for further instructions. Report your blood sugar to the short stay nurse when you get to Short Stay.   If you are admitted to the hospital after surgery: Your blood sugar will be checked by the staff and you will probably be given insulin after surgery (instead of oral diabetes medicines) to make sure you have good blood sugar levels. The goal for blood sugar control after surgery is 80-180 mg/dL.                       Do NOT Smoke (Tobacco/Vaping) for 24 hours prior to your procedure.   If you use a CPAP at night, you may bring your mask/headgear for your overnight stay.   Contacts, glasses, piercing's, hearing aid's, dentures or partials may not be  worn into surgery, please bring cases for these belongings.    For patients admitted to the hospital, discharge time will be determined by your treatment team.   Patients discharged the day of surgery will not be allowed to drive home, and someone needs to stay with them for 24 hours.   SURGICAL WAITING ROOM VISITATION Patients having surgery or a procedure may have no more than 2 support people in the waiting area - these visitors may rotate.   Children under the age of 56 must have an adult with them who is not the patient. If the patient needs to stay at the hospital during part of their recovery, the visitor guidelines for inpatient rooms apply. Pre-op nurse will coordinate an appropriate time for 1 support person to accompany patient in pre-op.  This support person may not rotate.    Please refer to the [facility] website for the visitor guidelines for Inpatients (after your surgery is over and you are in a regular room).      Special instructions:   [place]- Preparing For Surgery   Before surgery, you can play an important role. Because skin is not sterile, your skin needs to be as free of germs as possible. You can reduce the number of germs on your skin by washing with CHG (chlorahexidine gluconate) Soap before surgery.  CHG is an antiseptic cleaner which kills germs and bonds with the skin to continue killing germs even after washing.     Oral Hygiene is also important to reduce your risk of infection.  Remember - BRUSH YOUR TEETH THE MORNING OF SURGERY WITH YOUR REGULAR TOOTHPASTE   Please do not use if you have an allergy to CHG or antibacterial soaps. If your skin becomes reddened/irritated stop using the CHG.  Do not shave (including legs and underarms) for at least 48 hours prior to first CHG shower. It is OK to shave your face.   Please follow these instructions carefully.  Shower the NIGHT BEFORE SURGERY and the MORNING OF SURGERY   If you chose to wash your hair, wash your hair first as usual with your normal shampoo.   After you shampoo, rinse your hair and body thoroughly to remove the shampoo.   Use CHG Soap as you would any other liquid soap. You can apply CHG directly to the skin and wash gently with a scrungie or a clean washcloth.    Apply the CHG Soap to your body ONLY FROM THE NECK DOWN.  Do not use on open wounds or open sores. Avoid contact with your eyes, ears, mouth and genitals (private parts). Wash Face and genitals (private parts)  with your normal soap.    Wash thoroughly, paying special attention to the area where your surgery will be performed.   Thoroughly rinse your body with warm water from the neck down.   DO NOT shower/wash with your normal soap after using and rinsing off the CHG Soap.   Pat yourself dry with a CLEAN TOWEL.   Wear CLEAN PAJAMAS to bed the night before surgery   Place CLEAN SHEETS on your bed the night before your surgery   DO NOT SLEEP WITH PETS.     Day of Surgery: Take a shower with CHG soap. Do not wear jewelry or makeup Do not wear lotions, powders, perfumes, or deodorant. Do not shave 48 hours prior to surgery.  Do not bring valuables to the hospital.  [facility] is not responsible for any belongings or valuables. Do not wear nail polish, gel polish, artificial nails, or any other type of covering on natural nails (fingers and toes) If you have artificial nails or gel coating that need to be removed by a nail salon, please have this removed prior to surgery. Artificial nails or gel coating may interfere with anesthesia's ability to adequately monitor your vital signs. Wear Clean/Comfortable clothing the morning of surgery Remember to brush your teeth WITH YOUR REGULAR TOOTHPASTE.   Please read over the following fact sheets that you were given.       If you received a  COVID test during your pre-op visit  it is requested that you wear a mask when out in public, stay away from anyone that may not be feeling well and notify your surgeon if you develop symptoms. If you have been in contact with anyone that has tested positive in the last 10 days please notify you surgeon.

## 2022-05-27 NOTE — Progress Notes (Signed)
PCP - Loreen Freud [place] Cardiologist - Dr Verdis Prime  PPM/ICD - Denies  Chest x-ray - Denies EKG - 03/20/2022 Stress Test - 09/05/2015 ECHO - 12/16/2020 Cardiac Cath - Denies  Sleep Study - Denies  DM yes Type II Fasting Blood Sugar - 110's Checks Blood Sugar once a month  Last dose of GLP1 agonist-  05/24/2022 GLP1 instructions: Pt instructed to hold next dose scheduled on 05/30/2022 until sx.    Blood Thinner Instructions: Denies Aspirin Instructions: Pt instructed to call surgeon's office for aspirin instructions.   ERAS Protcol - Yes   COVID TEST-  N/A   Anesthesia review: Yes.  Cardiac history  Patient denies shortness of breath, fever, cough and chest pain at PAT appointment   All instructions explained to the patient, with a verbal understanding of the material. Patient agrees to go over the instructions while at home for a better understanding. The opportunity to ask questions was provided.

## 2022-05-28 ENCOUNTER — Encounter (HOSPITAL_COMMUNITY): Payer: Self-pay

## 2022-05-28 DIAGNOSIS — I10 Essential (primary) hypertension: Secondary | ICD-10-CM | POA: Diagnosis not present

## 2022-05-28 DIAGNOSIS — Z7984 Long term (current) use of oral hypoglycemic drugs: Secondary | ICD-10-CM | POA: Diagnosis not present

## 2022-05-28 DIAGNOSIS — K219 Gastro-esophageal reflux disease without esophagitis: Secondary | ICD-10-CM | POA: Diagnosis not present

## 2022-05-28 DIAGNOSIS — Z01812 Encounter for preprocedural laboratory examination: Secondary | ICD-10-CM | POA: Diagnosis not present

## 2022-05-28 DIAGNOSIS — E785 Hyperlipidemia, unspecified: Secondary | ICD-10-CM | POA: Diagnosis not present

## 2022-05-28 DIAGNOSIS — Z87891 Personal history of nicotine dependence: Secondary | ICD-10-CM | POA: Diagnosis not present

## 2022-05-28 DIAGNOSIS — Z7985 Long-term (current) use of injectable non-insulin antidiabetic drugs: Secondary | ICD-10-CM | POA: Diagnosis not present

## 2022-05-28 DIAGNOSIS — I447 Left bundle-branch block, unspecified: Secondary | ICD-10-CM | POA: Diagnosis not present

## 2022-05-28 DIAGNOSIS — M47812 Spondylosis without myelopathy or radiculopathy, cervical region: Secondary | ICD-10-CM | POA: Diagnosis not present

## 2022-05-28 DIAGNOSIS — E1143 Type 2 diabetes mellitus with diabetic autonomic (poly)neuropathy: Secondary | ICD-10-CM | POA: Diagnosis not present

## 2022-05-28 LAB — SURGICAL PCR SCREEN
MRSA, PCR: NEGATIVE
Staphylococcus aureus: NEGATIVE

## 2022-05-28 NOTE — Anesthesia Preprocedure Evaluation (Addendum)
Anesthesia Evaluation  Patient identified by MRN, date of birth, ID band Patient awake    Reviewed: Allergy & Precautions, NPO status , Patient's Chart, lab work & pertinent test results  Airway Mallampati: II  TM Distance: >3 FB Neck ROM: Full    Dental no notable dental hx.    Pulmonary former smoker   Pulmonary exam normal        Cardiovascular hypertension, Pt. on medications and Pt. on home beta blockers + Peripheral Vascular Disease  + dysrhythmias  Rhythm:Regular Rate:Normal     Neuro/Psych   Anxiety Depression       GI/Hepatic Neg liver ROS, hiatal hernia,GERD  Medicated,,  Endo/Other  diabetes, Type 2, Oral Hypoglycemic Agents    Renal/GU   negative genitourinary   Musculoskeletal   Abdominal Normal abdominal exam  (+)   Peds  Hematology   Anesthesia Other Findings   Reproductive/Obstetrics                             Anesthesia Physical Anesthesia Plan  ASA: 2  Anesthesia Plan: General   Post-op Pain Management: Celebrex PO (pre-op)*, Tylenol PO (pre-op)* and Ketamine IV*   Induction: Intravenous  PONV Risk Score and Plan: 3 and Ondansetron, Dexamethasone, Midazolam and Treatment may vary due to age or medical condition  Airway Management Planned: Mask and Oral ETT  Additional Equipment: None  Intra-op Plan:   Post-operative Plan: Extubation in OR  Informed Consent: I have reviewed the patients History and Physical, chart, labs and discussed the procedure including the risks, benefits and alternatives for the proposed anesthesia with the patient or authorized representative who has indicated his/her understanding and acceptance.     Dental advisory given  Plan Discussed with: CRNA  Anesthesia Plan Comments: (PAT note written 05/28/2022 by Shonna Chock, PA-C.  Lab Results      Component                Value               Date                      WBC                       8.6                 05/27/2022                HGB                      14.5                05/27/2022                HCT                      43.3                05/27/2022                MCV                      94.1                05/27/2022                PLT  295                 05/27/2022             Lab Results      Component                Value               Date                      NA                       138                 05/27/2022                K                        3.6                 05/27/2022                CO2                      26                  05/27/2022                GLUCOSE                  98                  05/27/2022                BUN                      10                  05/27/2022                CREATININE               0.74                05/27/2022                CALCIUM                  9.8                 05/27/2022                GFRNONAA                 >60                 05/27/2022           )       Anesthesia Quick Evaluation

## 2022-05-28 NOTE — Progress Notes (Signed)
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  History includes former smoker (quit 06/16/18), HTN, dyslipidemia, DM2, PVCs, left BBB, GERD, hiatal hernia, gastroparesis, recovering alcoholic, thyroid nodule (7 mm left thyroid nodule did not meet criteria for imaging surveillance or FNA 01/28/21 [place]), anxiety.   She had preoperative cardiology evaluation by Dr. Gasper Sells on 03/20/22. He wrote: "Preoperative Risk Assessment - The Revised Cardiac Risk Index = 0; 0.4%: very low risk - DASI score of 23 associated with 6 functional mets - No further cardiac testing is recommended prior to surgery.  - The patient may proceed to surgery at acceptable risk.   - Our service is available as needed in the peri-operative period."  She had PCP evaluation with Dr. Etter Sjogren on 03/17/22. She wrote, "Cleared for surgery as long as cards clears her."  A1c 5.5%. Last weekly injection semaglutide 05/24/22 and instructed to hold her 05/30/22 dose given surgery date. Anesthesia team to evaluate on the day of surgery.    VS: BP (!) 126/52   Pulse 80   Temp (!) 36.3 C   Resp 17   Ht _0  (1.651 m)   Wt 81.6 kg   SpO2 100%   BMI 29.95 kg/m    PROVIDERS: Ann Held, DO is PCP  Rudean Haskell, MD is cardiologist, previously Daneen Schick, MD who is retiring.   LABS: Labs reviewed: Acceptable for surgery. (all labs ordered are listed, but only abnormal results are displayed)  Labs Reviewed  GLUCOSE, CAPILLARY - Abnormal; Notable for the following components:      Result Value   Glucose-Capillary 128 (*)    All other components  within normal limits  COMPREHENSIVE METABOLIC PANEL  CBC  HEMOGLOBIN A1C  TYPE AND SCREEN    EKG: 03/20/22 ([facility]): NSR. LAD. LBBB.  - LBBB also present on 01/09/22 EKG tracing. Had incomplete LBBB 12/09/20.   CV: Echo 12/16/20: IMPRESSIONS   1. Left ventricular ejection fraction, by estimation, is 55 to 60%. Left  ventricular ejection fraction by 3D volume is 56 %. The left ventricle has  normal function. The left ventricle has no regional wall motion  abnormalities. Left ventricular diastolic   parameters are consistent with Grade I diastolic dysfunction (impaired  relaxation). The average left ventricular global longitudinal strain is  -24.9 %. The global longitudinal strain is normal.   2. Right ventricular systolic function is normal. The right ventricular  size is normal. There is normal pulmonary artery systolic pressure.   3. The mitral valve is grossly normal. Mild mitral valve regurgitation.  No evidence of mitral stenosis.   4. The aortic valve is tricuspid. Aortic valve regurgitation is not  visualized. No aortic stenosis is present.  - Comparison(s): Prior images unable to be directly viewed, comparison made  by report only. Stress echo 09/06/14 EF 65% per report.    Nuclear stress test 09/05/15: Nuclear stress EF: 45%. The left ventricular ejection fraction is mildly decreased (45-54%). This is an intermediate risk study. - Per Dr. Daneen Schick on 09/07/15, "Mildly abnormal due to LBBB. I dont see anything worrisome given history..."   Stress echo 09/06/14: Impressions:  -  Normal study after maximal exercise. Rate related LBBB noted    during peak exercise which should be of no clinical consequence    in the setting of normal LV function.    Past Medical History:  Diagnosis Date   Alcohol abuse    recovering alcoholic   Allergic urticaria    Anxiety    Cholecystitis, acute    Pt denies   Congenital anomaly of neck    Depression    Diabetes mellitus  without complication (HCC)    Dyslipidemia    Followed by PCP   Gastroparesis    GERD (gastroesophageal reflux disease)    Hematuria    Hiatal hernia    with reflux   Hypertension    stress test 11/2010 normal   Kidney stone    Left bundle branch block (LBBB)    Obesity    PVC's (premature ventricular contractions)    Controlled on beta blocker therapy;  Echo 9/10: EF 03-70%, grade 1 diastolic dysfunction, mild MR, mild LAE   Thyroid nodule    hx   Tinea corporis    Unspecified contraceptive management    Urinary tract infection    Vaginal Pap smear, abnormal     Past Surgical History:  Procedure Laterality Date   leep surgery     mid 20's   TONSILLECTOMY      MEDICATIONS:  albuterol (VENTOLIN HFA) 108 (90 Base) MCG/ACT inhaler   ALPRAZolam (XANAX) 0.25 MG tablet   amLODipine (NORVASC) 5 MG tablet   aspirin 81 MG tablet   atenolol (TENORMIN) 100 MG tablet   atorvastatin (LIPITOR) 10 MG tablet   blood glucose meter kit and supplies   clobetasol ointment (TEMOVATE) 0.05 %   EPINEPHrine 0.3 mg/0.3 mL IJ SOAJ injection   hydrochlorothiazide (HYDRODIURIL) 25 MG tablet   medroxyPROGESTERone Acetate 150 MG/ML SUSY   metFORMIN (GLUCOPHAGE) 1000 MG tablet   methocarbamol (ROBAXIN) 500 MG tablet   omeprazole (PRILOSEC) 40 MG capsule   Semaglutide, 2 MG/DOSE, 8 MG/3ML SOPN   No current facility-administered medications for this encounter.   At PAT RN visit, Advised to contact surgery for perioperative ASA instructions in not provided.  Myra Gianotti, PA-C Surgical Short Stay/Anesthesiology [facility] Phone [phone] [facility] Phone [phone] 05/28/2022 1:55 PM

## 2022-06-04 ENCOUNTER — Encounter (HOSPITAL_COMMUNITY): Payer: Self-pay | Admitting: Orthopedic Surgery

## 2022-06-04 ENCOUNTER — Encounter (HOSPITAL_COMMUNITY)
Admission: RE | Disposition: A | Discharge status: Home / Self Care | Payer: Self-pay | Source: Ambulatory Visit | Attending: Orthopedic Surgery

## 2022-06-04 ENCOUNTER — Observation Stay (HOSPITAL_COMMUNITY)
Admission: RE | Admit: 2022-06-04 | Discharge: 2022-06-05 | Disposition: A | Discharge status: Home / Self Care | Payer: Federal, State, Local not specified - PPO | Source: Ambulatory Visit | Attending: Orthopedic Surgery | Admitting: Orthopedic Surgery

## 2022-06-04 ENCOUNTER — Other Ambulatory Visit: Payer: Self-pay

## 2022-06-04 ENCOUNTER — Ambulatory Visit (HOSPITAL_COMMUNITY): Payer: Federal, State, Local not specified - PPO | Admitting: Certified Registered Nurse Anesthetist

## 2022-06-04 ENCOUNTER — Ambulatory Visit (HOSPITAL_COMMUNITY): Payer: Federal, State, Local not specified - PPO | Admitting: Vascular Surgery

## 2022-06-04 ENCOUNTER — Ambulatory Visit (HOSPITAL_COMMUNITY): Payer: Federal, State, Local not specified - PPO

## 2022-06-04 DIAGNOSIS — M4722 Other spondylosis with radiculopathy, cervical region: Principal | ICD-10-CM | POA: Insufficient documentation

## 2022-06-04 DIAGNOSIS — I10 Essential (primary) hypertension: Secondary | ICD-10-CM | POA: Diagnosis not present

## 2022-06-04 DIAGNOSIS — M5020 Other cervical disc displacement, unspecified cervical region: Secondary | ICD-10-CM | POA: Diagnosis present

## 2022-06-04 DIAGNOSIS — E119 Type 2 diabetes mellitus without complications: Secondary | ICD-10-CM | POA: Diagnosis not present

## 2022-06-04 DIAGNOSIS — Z79899 Other long term (current) drug therapy: Secondary | ICD-10-CM | POA: Diagnosis not present

## 2022-06-04 DIAGNOSIS — Z01818 Encounter for other preprocedural examination: Principal | ICD-10-CM

## 2022-06-04 DIAGNOSIS — M4322 Fusion of spine, cervical region: Secondary | ICD-10-CM | POA: Diagnosis not present

## 2022-06-04 HISTORY — PX: ANTERIOR CERVICAL DECOMP/DISCECTOMY FUSION: SHX1161

## 2022-06-04 LAB — POCT PREGNANCY, URINE: Preg Test, Ur: NEGATIVE

## 2022-06-04 LAB — GLUCOSE, CAPILLARY
Glucose-Capillary: 103 mg/dL — ABNORMAL HIGH (ref 70–99)
Glucose-Capillary: 127 mg/dL — ABNORMAL HIGH (ref 70–99)
Glucose-Capillary: 137 mg/dL — ABNORMAL HIGH (ref 70–99)
Glucose-Capillary: 185 mg/dL — ABNORMAL HIGH (ref 70–99)

## 2022-06-04 LAB — ABO/RH: ABO/RH(D): A NEG

## 2022-06-04 SURGERY — ANTERIOR CERVICAL DECOMPRESSION/DISCECTOMY FUSION 3 LEVELS
Anesthesia: General

## 2022-06-04 MED ORDER — ATORVASTATIN CALCIUM 10 MG PO TABS
10.0000 mg | ORAL_TABLET | Freq: Every day | ORAL | Status: DC
  Administered 2022-06-04: 10 mg via ORAL
  Filled 2022-06-04: qty 1

## 2022-06-04 MED ORDER — FENTANYL CITRATE (PF) 100 MCG/2ML IJ SOLN: 25.0000 ug | INTRAMUSCULAR | Status: DC | PRN

## 2022-06-04 MED ORDER — LACTATED RINGERS IV SOLN: INTRAVENOUS | Status: DC | PRN

## 2022-06-04 MED ORDER — MIDAZOLAM HCL 2 MG/2ML IJ SOLN
INTRAMUSCULAR | Status: DC | PRN
  Administered 2022-06-04: 2 mg via INTRAVENOUS

## 2022-06-04 MED ORDER — 0.9 % SODIUM CHLORIDE (POUR BTL) OPTIME
TOPICAL | Status: DC | PRN
  Administered 2022-06-04 (×2): 1000 mL

## 2022-06-04 MED ORDER — AMISULPRIDE (ANTIEMETIC) 5 MG/2ML IV SOLN: 10.0000 mg | Freq: Once | INTRAVENOUS | Status: DC | PRN

## 2022-06-04 MED ORDER — PHENYLEPHRINE HCL-NACL 20-0.9 MG/250ML-% IV SOLN
INTRAVENOUS | Status: DC | PRN
  Administered 2022-06-04: 15 ug/min via INTRAVENOUS

## 2022-06-04 MED ORDER — METHOCARBAMOL 500 MG PO TABS
500.0000 mg | ORAL_TABLET | Freq: Four times a day (QID) | ORAL | Status: DC | PRN
  Administered 2022-06-04: 500 mg via ORAL
  Filled 2022-06-04: qty 1

## 2022-06-04 MED ORDER — PROPOFOL 10 MG/ML IV BOLUS
INTRAVENOUS | Status: AC
  Filled 2022-06-04: qty 20

## 2022-06-04 MED ORDER — ONDANSETRON HCL 4 MG/2ML IJ SOLN
4.0000 mg | Freq: Four times a day (QID) | INTRAMUSCULAR | Status: DC | PRN
  Administered 2022-06-04: 4 mg via INTRAVENOUS
  Filled 2022-06-04: qty 2

## 2022-06-04 MED ORDER — FENTANYL CITRATE (PF) 250 MCG/5ML IJ SOLN
INTRAMUSCULAR | Status: AC
  Filled 2022-06-04: qty 5

## 2022-06-04 MED ORDER — KETAMINE HCL 50 MG/5ML IJ SOSY
PREFILLED_SYRINGE | INTRAMUSCULAR | Status: AC
  Filled 2022-06-04: qty 5

## 2022-06-04 MED ORDER — DEXAMETHASONE SODIUM PHOSPHATE 10 MG/ML IJ SOLN
INTRAMUSCULAR | Status: DC | PRN
  Administered 2022-06-04: 10 mg via INTRAVENOUS

## 2022-06-04 MED ORDER — INSULIN ASPART 100 UNIT/ML IJ SOLN: 0.0000 [IU] | Freq: Three times a day (TID) | INTRAMUSCULAR | Status: DC

## 2022-06-04 MED ORDER — CHLORHEXIDINE GLUCONATE 0.12 % MT SOLN
15.0000 mL | Freq: Once | OROMUCOSAL | Status: AC
  Administered 2022-06-04: 15 mL via OROMUCOSAL
  Filled 2022-06-04: qty 15

## 2022-06-04 MED ORDER — SODIUM CHLORIDE 0.9% FLUSH
3.0000 mL | Freq: Two times a day (BID) | INTRAVENOUS | Status: DC
  Administered 2022-06-04 (×2): 3 mL via INTRAVENOUS

## 2022-06-04 MED ORDER — ROCURONIUM BROMIDE 10 MG/ML (PF) SYRINGE
PREFILLED_SYRINGE | INTRAVENOUS | Status: DC | PRN
  Administered 2022-06-04: 60 mg via INTRAVENOUS
  Administered 2022-06-04: 20 mg via INTRAVENOUS
  Administered 2022-06-04: 10 mg via INTRAVENOUS
  Administered 2022-06-04: 40 mg via INTRAVENOUS
  Administered 2022-06-04: 20 mg via INTRAVENOUS

## 2022-06-04 MED ORDER — OXYCODONE HCL 5 MG PO TABS
5.0000 mg | ORAL_TABLET | ORAL | Status: DC | PRN
  Administered 2022-06-04 – 2022-06-05 (×2): 5 mg via ORAL
  Filled 2022-06-04 (×2): qty 1

## 2022-06-04 MED ORDER — HYDROMORPHONE HCL 1 MG/ML IJ SOLN: 1.0000 mg | INTRAMUSCULAR | Status: DC | PRN

## 2022-06-04 MED ORDER — METFORMIN HCL 500 MG PO TABS
1000.0000 mg | ORAL_TABLET | Freq: Two times a day (BID) | ORAL | Status: DC
  Administered 2022-06-04 – 2022-06-05 (×2): 1000 mg via ORAL
  Filled 2022-06-04 (×2): qty 2

## 2022-06-04 MED ORDER — OXYCODONE-ACETAMINOPHEN 10-325 MG PO TABS: 1.0000 | ORAL_TABLET | Freq: Four times a day (QID) | ORAL | 0 refills | Status: AC | PRN

## 2022-06-04 MED ORDER — THROMBIN (RECOMBINANT) 20000 UNITS EX SOLR
CUTANEOUS | Status: AC
  Filled 2022-06-04: qty 20000

## 2022-06-04 MED ORDER — LIDOCAINE 2% (20 MG/ML) 5 ML SYRINGE
INTRAMUSCULAR | Status: DC | PRN
  Administered 2022-06-04: 60 mg via INTRAVENOUS

## 2022-06-04 MED ORDER — ORAL CARE MOUTH RINSE: 15.0000 mL | Freq: Once | OROMUCOSAL | Status: AC

## 2022-06-04 MED ORDER — ACETAMINOPHEN 650 MG RE SUPP: 650.0000 mg | RECTAL | Status: DC | PRN

## 2022-06-04 MED ORDER — ACETAMINOPHEN 500 MG PO TABS
1000.0000 mg | ORAL_TABLET | Freq: Once | ORAL | Status: AC
  Administered 2022-06-04: 1000 mg via ORAL
  Filled 2022-06-04: qty 2

## 2022-06-04 MED ORDER — EPINEPHRINE 0.3 MG/0.3ML IJ SOAJ: 0.3000 mg | INTRAMUSCULAR | Status: DC | PRN

## 2022-06-04 MED ORDER — MENTHOL 3 MG MT LOZG: 1.0000 | LOZENGE | OROMUCOSAL | Status: DC | PRN

## 2022-06-04 MED ORDER — FENTANYL CITRATE (PF) 250 MCG/5ML IJ SOLN
INTRAMUSCULAR | Status: DC | PRN
  Administered 2022-06-04 (×5): 50 ug via INTRAVENOUS

## 2022-06-04 MED ORDER — AMLODIPINE BESYLATE 5 MG PO TABS: 5.0000 mg | ORAL_TABLET | Freq: Every day | ORAL | Status: DC

## 2022-06-04 MED ORDER — CEFAZOLIN SODIUM-DEXTROSE 1-4 GM/50ML-% IV SOLN
1.0000 g | Freq: Three times a day (TID) | INTRAVENOUS | Status: AC
  Administered 2022-06-04 (×2): 1 g via INTRAVENOUS
  Filled 2022-06-04 (×2): qty 50

## 2022-06-04 MED ORDER — BUPIVACAINE HCL (PF) 0.25 % IJ SOLN
INTRAMUSCULAR | Status: DC | PRN
  Administered 2022-06-04: 8 mL

## 2022-06-04 MED ORDER — PHENOL 1.4 % MT LIQD: 1.0000 | OROMUCOSAL | Status: DC | PRN

## 2022-06-04 MED ORDER — ONDANSETRON HCL 4 MG/2ML IJ SOLN
INTRAMUSCULAR | Status: DC | PRN
  Administered 2022-06-04: 4 mg via INTRAVENOUS

## 2022-06-04 MED ORDER — ACETAMINOPHEN 325 MG PO TABS
650.0000 mg | ORAL_TABLET | ORAL | Status: DC | PRN
  Administered 2022-06-05: 650 mg via ORAL
  Filled 2022-06-04: qty 2

## 2022-06-04 MED ORDER — METHOCARBAMOL 1000 MG/10ML IJ SOLN
500.0000 mg | Freq: Four times a day (QID) | INTRAVENOUS | Status: DC | PRN
  Filled 2022-06-04: qty 5

## 2022-06-04 MED ORDER — SURGIFLO WITH THROMBIN (HEMOSTATIC MATRIX KIT) OPTIME
TOPICAL | Status: DC | PRN
  Administered 2022-06-04 (×2): 1 via TOPICAL

## 2022-06-04 MED ORDER — BUPIVACAINE HCL (PF) 0.25 % IJ SOLN
INTRAMUSCULAR | Status: AC
  Filled 2022-06-04: qty 30

## 2022-06-04 MED ORDER — SUGAMMADEX SODIUM 200 MG/2ML IV SOLN
INTRAVENOUS | Status: DC | PRN
  Administered 2022-06-04: 200 mg via INTRAVENOUS

## 2022-06-04 MED ORDER — LACTATED RINGERS IV SOLN: INTRAVENOUS | Status: DC

## 2022-06-04 MED ORDER — OXYCODONE HCL 5 MG PO TABS
10.0000 mg | ORAL_TABLET | ORAL | Status: DC | PRN
  Administered 2022-06-04: 10 mg via ORAL
  Filled 2022-06-04: qty 2

## 2022-06-04 MED ORDER — EPINEPHRINE PF 1 MG/ML IJ SOLN
INTRAMUSCULAR | Status: AC
  Filled 2022-06-04: qty 1

## 2022-06-04 MED ORDER — CEFAZOLIN SODIUM-DEXTROSE 2-4 GM/100ML-% IV SOLN
2.0000 g | INTRAVENOUS | Status: AC
  Administered 2022-06-04 (×2): 2 g via INTRAVENOUS
  Filled 2022-06-04: qty 100

## 2022-06-04 MED ORDER — HYDROCHLOROTHIAZIDE 25 MG PO TABS: 25.0000 mg | ORAL_TABLET | Freq: Every day | ORAL | Status: DC

## 2022-06-04 MED ORDER — SODIUM CHLORIDE 0.9% FLUSH: 3.0000 mL | INTRAVENOUS | Status: DC | PRN

## 2022-06-04 MED ORDER — ONDANSETRON HCL 4 MG PO TABS: 4.0000 mg | ORAL_TABLET | Freq: Four times a day (QID) | ORAL | Status: DC | PRN

## 2022-06-04 MED ORDER — EPINEPHRINE PF 1 MG/ML IJ SOLN
INTRAMUSCULAR | Status: DC | PRN
  Administered 2022-06-04: .15 mg

## 2022-06-04 MED ORDER — PROPOFOL 10 MG/ML IV BOLUS
INTRAVENOUS | Status: DC | PRN
  Administered 2022-06-04: 160 mg via INTRAVENOUS
  Administered 2022-06-04: 40 mg via INTRAVENOUS

## 2022-06-04 MED ORDER — ONDANSETRON HCL 4 MG PO TABS: 4.0000 mg | ORAL_TABLET | Freq: Three times a day (TID) | ORAL | 0 refills | Status: DC | PRN

## 2022-06-04 MED ORDER — THROMBIN 20000 UNITS EX SOLR: CUTANEOUS | Status: DC | PRN

## 2022-06-04 MED ORDER — MAGNESIUM CITRATE PO SOLN
1.0000 | Freq: Once | ORAL | Status: DC | PRN
  Filled 2022-06-04: qty 296

## 2022-06-04 MED ORDER — METHOCARBAMOL 500 MG PO TABS: 500.0000 mg | ORAL_TABLET | Freq: Three times a day (TID) | ORAL | 0 refills | Status: AC | PRN

## 2022-06-04 MED ORDER — ATENOLOL 50 MG PO TABS: 100.0000 mg | ORAL_TABLET | Freq: Every day | ORAL | Status: DC

## 2022-06-04 MED ORDER — ALPRAZOLAM 0.25 MG PO TABS
0.2500 mg | ORAL_TABLET | Freq: Three times a day (TID) | ORAL | Status: DC | PRN
  Administered 2022-06-04: 0.25 mg via ORAL
  Filled 2022-06-04: qty 1

## 2022-06-04 MED ORDER — MIDAZOLAM HCL 2 MG/2ML IJ SOLN
INTRAMUSCULAR | Status: AC
  Filled 2022-06-04: qty 2

## 2022-06-04 MED ORDER — KETAMINE HCL 10 MG/ML IJ SOLN
INTRAMUSCULAR | Status: DC | PRN
  Administered 2022-06-04: 10 mg via INTRAVENOUS
  Administered 2022-06-04: 15 mg via INTRAVENOUS
  Administered 2022-06-04: 25 mg via INTRAVENOUS

## 2022-06-04 MED ORDER — INSULIN ASPART 100 UNIT/ML IJ SOLN: 0.0000 [IU] | INTRAMUSCULAR | Status: DC | PRN

## 2022-06-04 MED ORDER — INSULIN ASPART 100 UNIT/ML IJ SOLN: 0.0000 [IU] | Freq: Every day | INTRAMUSCULAR | Status: DC

## 2022-06-04 MED ORDER — POLYETHYLENE GLYCOL 3350 17 G PO PACK: 17.0000 g | PACK | Freq: Every day | ORAL | Status: DC | PRN

## 2022-06-04 SURGICAL SUPPLY — 66 items
BAG COUNTER SPONGE SURGICOUNT (BAG) IMPLANT
BUR EGG ELITE 4.0 (BURR) IMPLANT
BUR MATCHSTICK NEURO 3.0 LAGG (BURR) IMPLANT
CABLE BIPOLOR RESECTION CORD (MISCELLANEOUS) ×1 IMPLANT
CANISTER SUCT 3000ML PPV (MISCELLANEOUS) ×1 IMPLANT
CLSR STERI-STRIP ANTIMIC 1/2X4 (GAUZE/BANDAGES/DRESSINGS) ×1 IMPLANT
COVER MAYO STAND STRL (DRAPES) ×3 IMPLANT
COVER SURGICAL LIGHT HANDLE (MISCELLANEOUS) ×2 IMPLANT
DEVICE ENDSKLTN IMPLANT SM 7MM (Cage) IMPLANT
DRAIN CHANNEL 15F RND FF W/TCR (WOUND CARE) IMPLANT
DRAPE C-ARM 42X72 X-RAY (DRAPES) ×1 IMPLANT
DRAPE POUCH INSTRU U-SHP 10X18 (DRAPES) ×1 IMPLANT
DRAPE SURG 17X23 STRL (DRAPES) ×1 IMPLANT
DRAPE U-SHAPE 47X51 STRL (DRAPES) ×1 IMPLANT
DRSG OPSITE POSTOP 4X6 (GAUZE/BANDAGES/DRESSINGS) ×1 IMPLANT
DRSG TEGADERM 4X4.5 CHG (GAUZE/BANDAGES/DRESSINGS) IMPLANT
DURAPREP 26ML APPLICATOR (WOUND CARE) ×1 IMPLANT
ELECT COATED BLADE 2.86 ST (ELECTRODE) ×1 IMPLANT
ELECT PENCIL ROCKER SW 15FT (MISCELLANEOUS) ×1 IMPLANT
ELECT REM PT RETURN 9FT ADLT (ELECTROSURGICAL) ×1
ELECTRODE REM PT RTRN 9FT ADLT (ELECTROSURGICAL) ×1 IMPLANT
ENDOSKELETON IMPLANT SM 7MM (Cage) ×3 IMPLANT
GLOVE BIOGEL PI IND STRL 6.5 (GLOVE) ×1 IMPLANT
GLOVE BIOGEL PI IND STRL 8.5 (GLOVE) ×1 IMPLANT
GLOVE SS BIOGEL STRL SZ 8.5 (GLOVE) ×1 IMPLANT
GOWN STRL REUS W/ TWL LRG LVL3 (GOWN DISPOSABLE) ×2 IMPLANT
GOWN STRL REUS W/TWL LRG LVL3 (GOWN DISPOSABLE) ×2
KIT BASIN OR (CUSTOM PROCEDURE TRAY) ×1 IMPLANT
KIT TURNOVER KIT B (KITS) ×1 IMPLANT
NDL SPNL 18GX3.5 QUINCKE PK (NEEDLE) ×1 IMPLANT
NEEDLE HYPO 22GX1.5 SAFETY (NEEDLE) ×1 IMPLANT
NEEDLE SPNL 18GX3.5 QUINCKE PK (NEEDLE) ×1 IMPLANT
NS IRRIG 1000ML POUR BTL (IV SOLUTION) ×1 IMPLANT
PACK ORTHO CERVICAL (CUSTOM PROCEDURE TRAY) ×1 IMPLANT
PACK UNIVERSAL I (CUSTOM PROCEDURE TRAY) ×1 IMPLANT
PAD ARMBOARD 7.5X6 YLW CONV (MISCELLANEOUS) ×3 IMPLANT
PATTIES SURGICAL .25X.25 (GAUZE/BANDAGES/DRESSINGS) ×1 IMPLANT
PATTIES SURGICAL .5 X.5 (GAUZE/BANDAGES/DRESSINGS) IMPLANT
PIN ACP TEMP FIXATION (EXFIX) IMPLANT
PIN DISTRACTION MAXCESS-C 14 (PIN) IMPLANT
PLATE ACP 1.9X54 3LVL (Plate) IMPLANT
POSITIONER HEAD DONUT 9IN (MISCELLANEOUS) ×1 IMPLANT
PUTTY BONE DBX 2.5 MIS (Bone Implant) IMPLANT
PUTTY DBX 1CC (Putty) ×1 IMPLANT
PUTTY DBX 1CC DEPUY (Putty) IMPLANT
RESTRAINT LIMB HOLDER UNIV (RESTRAINTS) ×1 IMPLANT
SCREW ACP VA SD 3.5X15 (Screw) IMPLANT
SCREW VA ST 4.0X15 (Screw) IMPLANT
SPONGE INTESTINAL PEANUT (DISPOSABLE) ×2 IMPLANT
SPONGE SURGIFOAM ABS GEL 100 (HEMOSTASIS) ×1 IMPLANT
SPONGE T-LAP 4X18 ~~LOC~~+RFID (SPONGE) ×2 IMPLANT
SURGIFLO W/THROMBIN 8M KIT (HEMOSTASIS) IMPLANT
SUT BONE WAX W31G (SUTURE) ×1 IMPLANT
SUT MNCRL AB 3-0 PS2 27 (SUTURE) ×1 IMPLANT
SUT SILK 2 0 (SUTURE) ×1
SUT SILK 2-0 18XBRD TIE 12 (SUTURE) ×1 IMPLANT
SUT VIC AB 2-0 CT1 18 (SUTURE) ×1 IMPLANT
SYR BULB IRRIG 60ML STRL (SYRINGE) ×1 IMPLANT
SYR CONTROL 10ML LL (SYRINGE) ×1 IMPLANT
TAPE CLOTH 4X10 WHT NS (GAUZE/BANDAGES/DRESSINGS) ×1 IMPLANT
TAPE UMBILICAL COTTON 1/8X30 (MISCELLANEOUS) ×1 IMPLANT
TOWEL GREEN STERILE (TOWEL DISPOSABLE) ×1 IMPLANT
TOWEL GREEN STERILE FF (TOWEL DISPOSABLE) ×1 IMPLANT
TRAY FOLEY MTR SLVR 14FR STAT (SET/KITS/TRAYS/PACK) IMPLANT
TRAY FOLEY MTR SLVR 16FR STAT (SET/KITS/TRAYS/PACK) ×1 IMPLANT
WATER STERILE IRR 1000ML POUR (IV SOLUTION) ×1 IMPLANT

## 2022-06-04 NOTE — Discharge Instructions (Signed)
° ° ° ° ° °Today you will be discharged from the hospital.  The purpose of the following handout is to help guide you over the next 2 weeks.  First and foremost, be sure you have a follow up appointment with [name] Ramos 2 weeks from the time of your surgery to have your sutures removed.  Please call [facility] [phone] to schedule or confirm this appointment.   ° ° ° °Brace °You do not have to wear the collar while lying in bed or sitting in a high-backed chair, eating, sleeping or showering.  Other than these instances, you must wear the brace.  You may NOT wear the collar while driving a vehicle (see driving restrictions below).  It is advisable that you wear the collar in public places or while traveling in a car as a passenger.  [name] Ramos will discuss further use of the collar at your 2 week postop visit. ° °Wound Care °You may SHOWER 5 days from the date of surgery.  Shower directly over the steri-strips.  DO NOT scrub or submerge (bath tub, swimming pool, hot tub, etc.) the area.  Pat to dry following your shower.  There is no need for additional dressings other than the steri-strips.  Allow the steri-strips to fall off on their own.  Once the strips have fallen off, you may leave the area undressed.  DO NOT apply lotion/cream/ointment to the area.  The wound must remain dry at all times other than while showering.  [name] Ramos or his staff will remove your stiches at your first postop visit and give you additional instructions regarding wound care at that time.  ° °Activity °NO DRIVING FOR 2 WEEKS.  No lifting over 5 pounds (approximately a gallon of milk).  No bending, stooping, squatting or twisting.  No overhead activities.  We encourage you to walk (short distances and often throughout the day) as you can tolerate.  A good rule of thumb is to get up and move once or twice every hour.  You may go up and down stairs carefully.  As you continue to recover, [name] Ramos will address and  adjust restrictions to your activities until no further restrictions are needed.  However, until your first postop visit, when [name] Ramos can assess your recovery, you are to follow these instructions.  At the end of this document is a tentative outline of activities for up to 1 year.   ° ° ° ° °Medication °You will be discharged from the hospital with medication for pain, spasm, nausea and constipation.  You will be given enough medication to last until your first postop visit in 2 weeks.  Medications WILL NOT BE REFILLED EARLY; therefore, you are to take the medications only as directed.  If you have been given multiple prescriptions, please leave them with your pharmacy.  They can keep them on file for when you need them.  Medications that are lost or stolen WILL NOT be replaced.  We will address the need for continuing certain medications on an individual basis during your postop visit.  We ask that you avoid over the counter anti-inflammatory medications (Advil, Aleve, Motrin) for 3 months.   ° °What you can expect following neck surgery... °It is not uncommon to experience a sore throat or difficulty swallowing following neck surgery.  Cold liquids and soft foods are helpful in soothing this discomfort.  There is no specific diet that you are to follow after surgery, however, there are a   few things you should keep in mind to avoid unneeded discomfort.  Take small bites and eat slowly.  Chew your food thoroughly before swallowing.  ° °It is not uncommon to experience incisional soreness or pain in the back of the neck, shoulders or between the shoulder blades.  These symptoms will slowly begin to resolve as you continue to recover, however, they can last for a few weeks.   ° °It is not uncommon to experience INTERMITTENT arm pain following surgery.  This pain can mimic the arm pain you had prior to surgery.  As long as the pain resolves on its own and is not constant, there is no need to become alarmed.   ° °When To Call °If you experience fever >101F, loss of bowel or bladder control, painful swelling in the lower extremities, constant (unresolving) arm pain.  If you experience any of these symptoms, please call [facility] [phone]. ° °What's Next °As mentioned earlier, you will follow up with [name] Ramos in 2 weeks.  At that time, we will likely remove your stitches and discuss additional aspects of your recovery.   ° ° ° ° ° ° ° ° ° ° ° ° ° ° ° ° °ACTIVITY GUIDELINES ANTERIOR CERVICAL DISECTOMY AND FUSION ° °Activity Discharge 2 weeks 6 weeks 3 months 6 months 1 year  °Shower 5 days        °Submerge the wound  no no yes     °Walking outdoors yes       °Lifting 5 lbs yes       °Climbing stairs yes       °Cooking yes       °Car rides (less than 30 minutes) yes       °Car rides (greater than 30 minutes) no varies yes     °Air travel no varies yes     °Short outings (church, visits, etc...) yes       °School no no yes     °Driving a car no no varies yes    °Light upper extremity exercises no no varies yes    °Stationary bike no no yes     °Swimming (no diving) no no no varies yes   °Vacuuming, laundry, mopping no no no varies yes   °Biking outdoors no no no no varies yes  °Light jogging no no no varies yes   °Low impact aerobics no no no varies yes   °Non-contact sports (tennis, golf) no no no varies yes   °Hunting (no tree climbing) no no no varies yes   °Dancing (non-gymnastics) no no no varies yes   °Down-hill skiing (experienced skier) no no no no yes   °Down-hill skiing (novice) no no no no yes   °Cross-country skiing no no no no yes   °Horseback riding (noncompetitive)  no no no no yes   °Horseback riding (competitive) no no no no varies yes  °Gardening/landscaping no no no varies yes   °House repairs no no no varies varies yes  °Lifting up to 50 lbs no no no no varies yes  ° ° ° ° ° ° ° ° °

## 2022-06-04 NOTE — H&P (Signed)
History:  [name] Ramos is a very pleasant 57 year old woman whose had progressive neck and radicular arm pain since her work-related injury in 2020. Despite appropriate conservative management her quality of life has remained poor. As result we are moving forward with a 3 level ACDF C4-7.  Past Medical History:  Diagnosis Date   Alcohol abuse    recovering alcoholic   Allergic urticaria    Anxiety    Cholecystitis, acute    Pt denies   Congenital anomaly of neck    Depression    Diabetes mellitus without complication (HCC)    Dyslipidemia    Followed by PCP   Gastroparesis    GERD (gastroesophageal reflux disease)    Hematuria    Hiatal hernia    with reflux   Hypertension    stress test 11/2010 normal   Kidney stone    Left bundle branch block (LBBB)    Obesity    PVC's (premature ventricular contractions)    Controlled on beta blocker therapy;  Echo 9/10: EF 38-25%, grade 1 diastolic dysfunction, mild MR, mild LAE   Thyroid nodule    hx   Tinea corporis    Unspecified contraceptive management    Urinary tract infection    Vaginal Pap smear, abnormal     Allergies  Allergen Reactions   Ace Inhibitors Other (See Comments)    Numbness, tingling of lips.   Bee Venom Swelling    Leg swelling   Lisinopril     Other reaction(s): Other (See Comments) other   Piroxicam Hives    No current facility-administered medications on file prior to encounter.   Current Outpatient Medications on File Prior to Encounter  Medication Sig Dispense Refill   ALPRAZolam (XANAX) 0.25 MG tablet Take 1 tablet (0.25 mg total) by mouth 3 (three) times daily as needed for anxiety. 30 tablet 2   amLODipine (NORVASC) 5 MG tablet Take 1 tablet (5 mg total) by mouth daily. 90 tablet 3   aspirin 81 MG tablet Take 81 mg by mouth daily.     atenolol (TENORMIN) 100 MG tablet Take 1 tablet (100 mg total) by mouth daily. 90 tablet 3   atorvastatin (LIPITOR) 10 MG tablet Take 1 tablet (10 mg total) by  mouth daily. 90 tablet 3   clobetasol ointment (TEMOVATE) 0.05 % Apply to affected area every night for 4 weeks, then every other day for 4 weeks and then twice a week (Patient taking differently: Apply 1 Application topically daily as needed (irritation).) 60 g 5   EPINEPHrine 0.3 mg/0.3 mL IJ SOAJ injection Inject 0.3 mg into the muscle as needed for anaphylaxis. 2 each 1   hydrochlorothiazide (HYDRODIURIL) 25 MG tablet Take 1 tablet (25 mg total) by mouth daily. 90 tablet 3   metFORMIN (GLUCOPHAGE) 1000 MG tablet Take 1 tablet (1,000 mg total) by mouth 2 (two) times daily with a meal. 180 tablet 3   methocarbamol (ROBAXIN) 500 MG tablet Take 1 tablet (500 mg total) by mouth every 6 (six) hours as needed for muscle spasms. 30 tablet 1   omeprazole (PRILOSEC) 40 MG capsule TAKE 1 CAPSULE BY MOUTH EVERY DAY 90 capsule 3   Semaglutide, 2 MG/DOSE, 8 MG/3ML SOPN Inject 2 mg as directed once a week. 3 mL 3   albuterol (VENTOLIN HFA) 108 (90 Base) MCG/ACT inhaler Inhale 2 puffs into the lungs every 6 (six) hours as needed for wheezing or shortness of breath. (Patient not taking: Reported on 05/21/2022) 8  g 0   blood glucose meter kit and supplies Dispense based on patient and insurance preference. Use up to four times daily as directed. (FOR ICD-10 E10.9, E11.9). 1 each 0    Physical Exam: Vitals:   06/04/22 0636  BP: (!) 147/85  Pulse: 71  Resp: 17  Temp: 98.2 F (36.8 C)  SpO2: 100%   Body mass index is 28.96 kg/m. Clinical exam: [name] Ramos is a pleasant individual, who appears younger than their stated age.  She is alert and orientated 3.  No shortness of breath, chest pain.  Abdomen is soft and non-tender, negative loss of bowel and bladder control, no rebound tenderness.  Negative: skin lesions abrasions contusions  Peripheral pulses: 2+ peripheral pulses in the upper extremity bilaterally. LE compartments are: Soft and nontender.  Gait pattern: normal  Assistive devices:  None  Neuro: Positive numbness and dysesthesias in the right upper extremity. 5/5 motor strength in the upper extremity bilaterally. Positive Spurling sign with reproduction of right radicular arm pain. Negative Hoffman test, 1+ deep tendon reflexes symmetrically.  Musculoskeletal: Significant neck pain radiating predominantly into the right upper extremity. Positive crepitus and decreased range of motion due to pain.  Imaging: X-rays of the cervical spine demonstrate multilevel degenerative disease with loss of normal cervical lordosis primarily C4-7.  Cervical MRI: completed on 02/01/2022. No cord signal changes. Severe left neuroforaminal narrowing C5-6 and C6-7. There is moderate to severe right neuroforaminal narrowing C5-7. There is severe right and moderate left neuroforaminal narrowing at C4-5. Patient has mild degenerative disease at C3-4.  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 She has had physical therapy in the past and it has not provided any relief. Her overall quality of life is deteriorated since her work-related injury in 2020.  At this point time she is expressed a desire to move forward with surgery. She has multilevel degenerative disc disease, loss and normal sagittal alignment, and severe foraminal stenosis affecting the exiting nerve roots from the C4-5 to the C6-7 levels. Based on her clinical findings I have recommended a 3 level ACDF C4-7. The primary goal of surgery is to reduce her neck and neuropathic arm pain and thereby improve her quality of life. The patient may have examination of pain but the primary goal is pain reduction.  Risks and benefits of surgery were discussed with the patient. These include: Infection, bleeding, death, stroke, paralysis, ongoing or worse pain, need for additional surgery, nonunion, leak of  spinal fluid, adjacent segment degeneration requiring additional fusion surgery. Pseudoarthrosis (nonunion)requiring supplemental posterior fixation. Throat pain, swallowing difficulties, hoarseness or change in voice.  Questions were addressed. She is expressed an understanding of the surgery, risks and benefits, and the goal of surgery. She is expressed her willingness to move forward with surgery.

## 2022-06-04 NOTE — Op Note (Signed)
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  Positive radicular arm pain  POST-OPERATIVE DIAGNOSIS: Same  PROCEDURE:   ACDF C4-7  SURGEON:  Melina Schools, MD  PHYSICIAN ASSISTANT: None  ANESTHESIA:   General  EBL: 50 ml   Complications: None  Implants: Titan 7 mm lordotic small intervertebral cages.  NuVasive ACP cervical plate (54 mm length) locking screws: 3.5 x 50 mm length x 7.  Single rescue screw placed through the right side of the right at the C4 level.  4.0 x 15 mm length.  Graft: DBX mix  BRIEF HISTORY: Esra Frankowski is a 57 y.o. female who presents to my office with significant neck and radicular arm pain.  Imaging confirmed severe foraminal stenosis with nerve root compression.  Clinical exam is consistent with cervical spondylitic radiculopathy.  Attempts at conservative management have failed to alleviate her symptoms and improve her quality of life.  As a result we elected to move forward with surgery.  All appropriate risks, benefits, alternatives were discussed and consent was obtained.  PROCEDURE DETAILS: Patient was brought into the operating room and was properly positioned on the operating room table.  After induction with general anesthesia the patient was endotracheally intubated.  A timeout was taken to confirm all important data: including patient, procedure, and the level. Teds, SCD's were applied.   A Foley catheter was inserted by the nurse and the anterior cervical spine was prepped and draped in standard fashion.  Using fluoroscopy I marked out the C5-6 disc space level.  I infiltrated the incision site with quarter percent Marcaine with epinephrine.  A transverse incision starting at the proceeding to the left was then made and sharp dissection was carried out down to the platysma.   Performing a standard Smith-Robinson approach to the cervical spine-dissected through the platysma and continued through the deep cervical fascia along the medial border the sternocleidomastoid.  The omohyoid muscle was identified and released from its sling for improved mobilization and visualization.  I swept the esophagus and trachea to the right and protected with a hand-held retractor and then identified the carotid sheath and protected with a finger.  Using Kitner dissectors I continued dissecting through the remainder of the prevertebral fascia to expose the anterior longitudinal ligament.  Identified from the superior aspect of C4 down to the inferior aspect of C7.  A needle was placed into the C4-5 disc space and an x-ray was taken to confirm I was at the appropriate level.  The disc space was marked and then I mobilized the longus coli muscles from the superior aspect of C4 down to the inferior aspect of C7.  The large anterior exostoses were removed with a Leksell rongeur.  Caspar retractor blades were placed into the wound underneath the longus coli muscle and the endotracheal cuff was deflated.  I expanded the retractors to the appropriate width and then reinflated the endotracheal cuff.  An annulotomy was performed at C6-7 and I remove the bulk of the disc material with pituitary rongeurs.  The overhanging osteophyte from the inferior aspect of C6 was then removed with a 2 mm Kerrison rongeur.  Distraction pins were then placed and I distracted the intervertebral space with a lamina spreader and maintained it with the distraction pin set.  I continued use curettes to remove all of the disc material until I could visualize  the posterior annulus.  I then used a fine nerve hook to dissect posteriorly underneath the disc material and I resected it with a 1 mm Kerrison rongeur.  I then dissected gently through the posterior longitudinal ligament and created a plane between the thecal sac and the PLL.  I  resected the PLL with a 1 mm Kerrison rongeur and then undercut the uncovertebral joint for improved neural decompression.  I then make sure I had removed all of the cartilaginous endplate from the vertebral bodies.  I then rasped the endplates to the head bleeding subchondral bone.  The wound was irrigated and then I measured with the implants.  I elected to use the 7 mm small lordotic implant.  The cages obtained and packed with the allograft and malleted to the appropriate depth.  Imaging demonstrated satisfactory position of the cage.  At this point I removed the distraction pin from the C7 vertebral body and sealed the bone bleeding with bone wax.  I repositioned my retractor to expose the C5-6 disc space level.  Using the same technique an annulotomy was performed and I remove the bulk of the disc material at this level.  Distraction pins were placed and I distracted the intervertebral space.  I remove the remainder portion of the disc with the curettes and pituitary rongeurs.  The posterior longitudinal ligament was then identified and I gently dissected through this with a fine nerve hook.  I then removed the PLL with a 1 mm Kerrison rongeur to adequately decompress the thecal sac.  This allowed me to undercut the uncovertebral joints to further decompress the nerve root.  This was a similar and result as to what was achieved at C6-7.  I measured the intervertebral space rasped the endplates and then placed the permanent cage that was packed with the allograft.  I used the same technique to perform a comprehensive discectomy at C4-5.  I trialed and placed the same size implant.  At this point all 3 levels were complete.  The wound was then copiously irrigated with normal saline.  With all of the distraction pins removed I made sure that hemostasis at the bone edges.  I then contoured the ACP plate and placed it into the wound.  I secured it with 15 mm locking screws at each level.  All screws had  excellent purchase except for the right C4.  This was removed and replaced with a rescue screw that obtained excellent purchase.  The locking device was engaged according manufacture standards to prevent backout of the screws.  The wound was then irrigated again and I made sure that hemostasis using bipolar cautery and Floseal.  I checked to ensure the esophagus was not entrapped in the plate and I returned to midline.  I then closed the platysma with interrupted 2-0 Vicryl suture and a 3-0 Monocryl for the skin.  Steri-Strips and dry dressings were applied and the patient was ultimately extubated transfer the PACU without incident.  The end of the case all needle sponge counts were correct.  There were no adverse intraoperative events.  Melina Schools, MD 06/04/2022 11:53 AM

## 2022-06-04 NOTE — Transfer of Care (Signed)
Immediate Anesthesia Transfer of Care Note  Patient: [name] Ramos  Procedure(s) Performed: ANTERIOR CERVICAL DECOMPRESSION/DISCECTOMY FUSION 3 LEVELS C4-7  Patient Location: PACU  Anesthesia Type:General  Level of Consciousness: awake, alert , and oriented  Airway & Oxygen Therapy: Patient Spontanous Breathing and Patient connected to nasal cannula oxygen  Post-op Assessment: Report given to RN and Post -op Vital signs reviewed and stable  Post vital signs: Reviewed and stable  Last Vitals:  Vitals Value Taken Time  BP 120/74 06/04/22 1204  Temp    Pulse 86 06/04/22 1207  Resp 13 06/04/22 1207  SpO2 92 % 06/04/22 1207  Vitals shown include unvalidated device data.  Last Pain:  Vitals:   06/04/22 0636  TempSrc: Oral  PainSc:          Complications: No notable events documented.

## 2022-06-04 NOTE — Brief Op Note (Signed)
06/04/2022  12:04 PM  PATIENT:  [name] Ramos  57 y.o. 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  POST-OPERATIVE DIAGNOSIS:  * No post-op diagnosis entered *  PROCEDURE:  Procedure(s) with comments: ANTERIOR CERVICAL DECOMPRESSION/DISCECTOMY FUSION 3 LEVELS C4-7 (N/A) - 3.5 hrs 3 C-Bed  SURGEON:  Surgeon(s) and Role:    Melina Schools, MD - Primary  PHYSICIAN ASSISTANT:   ASSISTANTS: none   ANESTHESIA:   general  EBL:  50 mL   BLOOD ADMINISTERED:none  DRAINS: none   LOCAL MEDICATIONS USED:  MARCAINE     SPECIMEN:  No Specimen  DISPOSITION OF SPECIMEN:  N/A  COUNTS:  YES  TOURNIQUET:  * No tourniquets in log *  DICTATION: .Dragon Dictation  PLAN OF CARE: Admit for overnight observation  PATIENT DISPOSITION:  PACU - hemodynamically stable.

## 2022-06-04 NOTE — Anesthesia Procedure Notes (Signed)
Procedure Name: Intubation Date/Time: 06/04/2022 7:54 AM  Performed by: Darletta Moll, CRNAPre-anesthesia Checklist: Patient identified, Emergency Drugs available, Suction available and Patient being monitored Patient Re-evaluated:Patient Re-evaluated prior to induction Oxygen Delivery Method: Circle system utilized Preoxygenation: Pre-oxygenation with 100% oxygen Induction Type: IV induction Ventilation: Mask ventilation without difficulty Laryngoscope Size: Glidescope and 3 Grade View: Grade I Tube type: Oral Tube size: 7.0 mm Number of attempts: 1 Airway Equipment and Method: Rigid stylet and Video-laryngoscopy Placement Confirmation: ETT inserted through vocal cords under direct vision, positive ETCO2 and breath sounds checked- equal and bilateral Secured at: 21 cm Tube secured with: Tape Dental Injury: Teeth and Oropharynx as per pre-operative assessment

## 2022-06-04 NOTE — Anesthesia Postprocedure Evaluation (Signed)
Anesthesia Post Note  Patient: [name] Ramos  Procedure(s) Performed: ANTERIOR CERVICAL DECOMPRESSION/DISCECTOMY FUSION 3 LEVELS C4-7     Patient location during evaluation: PACU Anesthesia Type: General Level of consciousness: awake and alert Pain management: pain level controlled Vital Signs Assessment: post-procedure vital signs reviewed and stable Respiratory status: spontaneous breathing, nonlabored ventilation, respiratory function stable and patient connected to nasal cannula oxygen Cardiovascular status: blood pressure returned to baseline and stable Postop Assessment: no apparent nausea or vomiting Anesthetic complications: no   No notable events documented.  Last Vitals:  Vitals:   06/04/22 1230 06/04/22 1307  BP: 119/66 125/72  Pulse: 79 73  Resp: 16 16  Temp:  36.8 C  SpO2: 98% 96%    Last Pain:  Vitals:   06/04/22 1307  TempSrc: Oral  PainSc:                  March Rummage Trinadee Verhagen

## 2022-06-05 DIAGNOSIS — Z79899 Other long term (current) drug therapy: Secondary | ICD-10-CM | POA: Diagnosis not present

## 2022-06-05 DIAGNOSIS — M4722 Other spondylosis with radiculopathy, cervical region: Secondary | ICD-10-CM | POA: Diagnosis not present

## 2022-06-05 DIAGNOSIS — E119 Type 2 diabetes mellitus without complications: Secondary | ICD-10-CM | POA: Diagnosis not present

## 2022-06-05 DIAGNOSIS — I10 Essential (primary) hypertension: Secondary | ICD-10-CM | POA: Diagnosis not present

## 2022-06-05 LAB — GLUCOSE, CAPILLARY: Glucose-Capillary: 119 mg/dL — ABNORMAL HIGH (ref 70–99)

## 2022-06-05 MED FILL — Thrombin (Recombinant) For Soln 20000 Unit: CUTANEOUS | Qty: 1 | Status: AC

## 2022-06-05 NOTE — Progress Notes (Signed)
Patient alert and oriented, mae's well, voiding adequate amount of urine, swallowing without difficulty, no c/o pain at time of discharge. Patient discharged home with family. Script and discharged instructions given to patient. Patient and family stated understanding of instructions given. Patient has an appointment with Dr. Brooks in 2 weeks 

## 2022-06-05 NOTE — Evaluation (Signed)
Physical Therapy Evaluation Patient Details Name: [name] Ramos MRN: [id number] DOB: September 06, 1965 Today's Date: 06/05/2022  History of Present Illness  Patient is a 57 y/o female who presents on 06/04/22 for C4-7 ACDF. PMH includes alcohol abuse, anxiety, depression, DM, HTN and obesity.  Clinical Impression  Patient presents with post surgical deficits s/p above surgery. Pt lives at home alone but will have her parents staying with her for 2 weeks to assist. Pt is independent at baseline and works for the [employer]. Today, pt tolerated bed mobility, transfers and ambulation mod I-independent. Able to perform stairs without difficulty. Education re: brace management/wearing, precautions, positioning, log roll technique, ADLs etc. Pt does not require further skilled therapy services as pt functioning at Mod I level and will have assist at home at d/c. All education completed. DIscharge from therapy.       Recommendations for follow up therapy are one component of a multi-disciplinary discharge planning process, led by the attending physician.  Recommendations may be updated based on patient status, additional functional criteria and insurance authorization.  Follow Up Recommendations No PT follow up      Assistance Recommended at Discharge PRN  Patient can return home with the following  Assistance with cooking/housework;Assist for transportation    Equipment Recommendations None recommended by PT  Recommendations for Other Services       Functional Status Assessment Patient has not had a recent decline in their functional status     Precautions / Restrictions Precautions Precautions: Cervical Precaution Booklet Issued: Yes (comment) Required Braces or Orthoses: Cervical Brace Cervical Brace: Hard collar Spinal Brace: Applied in sitting position Restrictions Weight Bearing Restrictions: No      Mobility  Bed Mobility Overal bed mobility: Needs Assistance Bed Mobility:  Rolling, Sidelying to Sit Rolling: Modified independent (Device/Increase time) Sidelying to sit: Modified independent (Device/Increase time)       General bed mobility comments: HOB flat, no use of rails to simulate home. Cues for log roll technique.    Transfers Overall transfer level: Independent                 General transfer comment: Stood from EOB without difficulty.    Ambulation/Gait Ambulation/Gait assistance: Modified independent (Device/Increase time) Gait Distance (Feet): 350 Feet Assistive device: None Gait Pattern/deviations: WFL(Within Functional Limits)   Gait velocity interpretation: >2.62 ft/sec, indicative of community ambulatory   General Gait Details: Steady gait with no evidence of imbalance/difficulty.  Stairs Stairs: Yes Stairs assistance: Modified independent (Device/Increase time) Stair Management: One rail Left, Alternating pattern Number of Stairs: 13 General stair comments: Use of rail for support, cues for safety when wearing brace  Wheelchair Mobility    Modified Rankin (Stroke Patients Only)       Balance Overall balance assessment: No apparent balance deficits (not formally assessed)                                           Pertinent Vitals/Pain Pain Assessment Pain Assessment: No/denies pain    Home Living Family/patient expects to be discharged to:: Private residence Living Arrangements: Parent Available Help at Discharge: Family;Available 24 hours/day (for 2 weeks) Type of Home: House Home Access: Stairs to enter Entrance Stairs-Rails: Right Entrance Stairs-Number of Steps: 2 Alternate Level Stairs-Number of Steps: 1 flight Home Layout: Two level;Bed/bath upstairs Home Equipment: None      Prior Function Prior Level of  Function : Independent/Modified Independent             Mobility Comments: independent, works as at a post office, drives ADLs Comments: independent     Electronics engineer        Extremity/Trunk Assessment   Upper Extremity Assessment Upper Extremity Assessment: Defer to OT evaluation;Overall [facility] for tasks assessed    Lower Extremity Assessment Lower Extremity Assessment: Overall WFL for tasks assessed    Cervical / Trunk Assessment Cervical / Trunk Assessment: Neck Surgery  Communication   Communication: No difficulties  Cognition Arousal/Alertness: Awake/alert Behavior During Therapy: WFL for tasks assessed/performed Overall Cognitive Status: Within Functional Limits for tasks assessed                                          General Comments General comments (skin integrity, edema, etc.): Instructed pt how to donn/doff brace and to wash pads    Exercises     Assessment/Plan    PT Assessment Patient does not need any further PT services  PT Problem List         PT Treatment Interventions      PT Goals (Current goals can be found in the Care Plan section)  Acute Rehab PT Goals Patient Stated Goal: to go home PT Goal Formulation: All assessment and education complete, DC therapy    Frequency       Co-evaluation               AM-PAC PT "6 Clicks" Mobility  Outcome Measure Help needed turning from your back to your side while in a flat bed without using bedrails?: None Help needed moving from lying on your back to sitting on the side of a flat bed without using bedrails?: None Help needed moving to and from a bed to a chair (including a wheelchair)?: None Help needed standing up from a chair using your arms (e.g., wheelchair or bedside chair)?: None Help needed to walk in hospital room?: None Help needed climbing 3-5 steps with a railing? : None 6 Click Score: 24    End of Session Equipment Utilized During Treatment: Cervical collar Activity Tolerance: Patient tolerated treatment well Patient left: in bed;with call bell/phone within reach Nurse Communication: Mobility status PT Visit  Diagnosis: Difficulty in walking, not elsewhere classified (R26.2)    Time: 9449-6759 PT Time Calculation (min) (ACUTE ONLY): 23 min   Charges:   PT Evaluation $PT Eval Low Complexity: 1 Low PT Treatments $Gait Training: 8-22 mins        Marisa Severin, PT, DPT Acute Rehabilitation Services Secure chat preferred Office [phone]     Marguarite Arbour A Araya Roel 06/05/2022, 8:57 AM

## 2022-06-05 NOTE — Evaluation (Signed)
Occupational Therapy Evaluation Patient Details Name: Armenta Erskin MRN: [id number] DOB: 28-Feb-1966 Today's Date: 06/05/2022   History of Present Illness Patient is a 57 y/o female who presents on 06/04/22 for C4-7 ACDF. PMH includes alcohol abuse, anxiety, depression, DM, HTN and obesity.   Clinical Impression   PTA, pt performing ADL, IADL, work, and driving with independence. Upon eval, pt performing ADL with mod I. Pt educated and demonstrating use of compensatory techniques for bed mobility, UB dressing, LB dressing, brace application, grooming, tub-shower transfer within precautions. Additionally reviewing compensatory techniques for laundry and dish washing within precautions on pt request. Pt verbalizing understanding. Recommending discharge home with no OT follow up. OT to sign off. Thank you for this order.      Recommendations for follow up therapy are one component of a multi-disciplinary discharge planning process, led by the attending physician.  Recommendations may be updated based on patient status, additional functional criteria and insurance authorization.   Follow Up Recommendations  No OT follow up     Assistance Recommended at Discharge PRN  Patient can return home with the following Other (comment) (on pt request)    Functional Status Assessment  Patient has had a recent decline in their functional status and demonstrates the ability to make significant improvements in function in a reasonable and predictable amount of time.  Equipment Recommendations  None recommended by OT    Recommendations for Other Services       Precautions / Restrictions Precautions Precautions: Cervical Precaution Booklet Issued: Yes (comment) Precaution Comments: All precautions reviewed within the context of ADL. Additionally reviewing maintenance of precautions of IADL on pt request. Required Braces or Orthoses: Cervical Brace Cervical Brace: Hard collar Spinal Brace: Applied in  sitting position Restrictions Weight Bearing Restrictions: No      Mobility Bed Mobility Overal bed mobility: Modified Independent                  Transfers Overall transfer level: Independent                 General transfer comment: Stood from EOB without difficulty.      Balance Overall balance assessment: No apparent balance deficits (not formally assessed)                                         ADL either performed or assessed with clinical judgement   ADL Overall ADL's : Modified independent                                       General ADL Comments: Mod I for increased time and use of compensatory techniques. Reviewed all education per routine including compensatory techniques for UB dressing, LB dressing, bed mobility, bathing, grooming, toileting, and tub/shower transfer (pt has both but using tub shower at home). Pt additionally with concerns about performing laundry and dishwashing. OT reviewing compensatory techniques and pt verbalizing understanding.     Vision Baseline Vision/History: 0 No visual deficits Ability to See in Adequate Light: 0 Adequate Patient Visual Report: No change from baseline Vision Assessment?: No apparent visual deficits     Perception     Praxis      Pertinent Vitals/Pain Pain Assessment Pain Assessment: No/denies pain     Hand Dominance     Extremity/Trunk Assessment  Upper Extremity Assessment Upper Extremity Assessment: Overall WFL for tasks assessed   Lower Extremity Assessment Lower Extremity Assessment: Overall WFL for tasks assessed   Cervical / Trunk Assessment Cervical / Trunk Assessment: Neck Surgery   Communication Communication Communication: No difficulties   Cognition Arousal/Alertness: Awake/alert Behavior During Therapy: WFL for tasks assessed/performed Overall Cognitive Status: Within Functional Limits for tasks assessed                                        General Comments  PT instructed pt regarding brace application and cleaning    Exercises     Shoulder Instructions      Home Living Family/patient expects to be discharged to:: Private residence Living Arrangements: Parent Available Help at Discharge: Family;Available 24 hours/day (for two weeks) Type of Home: House Home Access: Stairs to enter [employer] of Steps: 2 Entrance Stairs-Rails: Right Home Layout: Two level;Bed/bath upstairs Alternate Level Stairs-Number of Steps: 1 flight   Bathroom Shower/Tub: Tub/shower unit;Walk-in shower   Bathroom Toilet: Standard     Home Equipment: None          Prior Functioning/Environment Prior Level of Function : Independent/Modified Independent             Mobility Comments: independent, works as at a post office, drives ADLs Comments: independent        OT Problem List: Decreased strength;Impaired balance (sitting and/or standing);Decreased knowledge of precautions      OT Treatment/Interventions:      OT Goals(Current goals can be found in the care plan section) Acute Rehab OT Goals Patient Stated Goal: go home OT Goal Formulation: With patient  OT Frequency:      Co-evaluation              AM-PAC OT "6 Clicks" Daily Activity     Outcome Measure Help from another person eating meals?: None Help from another person taking care of personal grooming?: None Help from another person toileting, which includes using toliet, bedpan, or urinal?: None Help from another person bathing (including washing, rinsing, drying)?: None Help from another person to put on and taking off regular upper body clothing?: None Help from another person to put on and taking off regular lower body clothing?: None 6 Click Score: 24   End of Session Equipment Utilized During Treatment: Cervical collar Nurse Communication: Mobility status  Activity Tolerance: Patient tolerated treatment well Patient  left: in bed;with call bell/phone within reach  OT Visit Diagnosis: Unsteadiness on feet (R26.81);Muscle weakness (generalized) (M62.81)                Time: 1761-6073 OT Time Calculation (min): 13 min Charges:  OT General Charges $OT Visit: 1 Visit OT Evaluation $OT Eval Low Complexity: 1 Low  Elder Cyphers, OTR/L [facility] Acute Rehabilitation Office: [phone]   Magnus Ivan 06/05/2022, 9:37 AM

## 2022-06-05 NOTE — Discharge Summary (Signed)
Patient ID: [name] Ramos MRN: [id number] DOB/AGE: [birth year] 55 y.o.  Admit date: 06/04/2022 Discharge date: 06/05/2022  Admission Diagnoses:  Principal Problem:   Cervical disc herniation   Discharge Diagnoses:  Principal Problem:   Cervical disc herniation  status post Procedure(s): ANTERIOR CERVICAL DECOMPRESSION/DISCECTOMY FUSION 3 LEVELS C4-7  Past Medical History:  Diagnosis Date   Alcohol abuse    recovering alcoholic   Allergic urticaria    Anxiety    Cholecystitis, acute    Pt denies   Congenital anomaly of neck    Depression    Diabetes mellitus without complication (HCC)    Dyslipidemia    Followed by PCP   Gastroparesis    GERD (gastroesophageal reflux disease)    Hematuria    Hiatal hernia    with reflux   Hypertension    stress test 11/2010 normal   Kidney stone    Left bundle branch block (LBBB)    Obesity    PVC's (premature ventricular contractions)    Controlled on beta blocker therapy;  Echo 9/10: EF 76-16%, grade 1 diastolic dysfunction, mild MR, mild LAE   Thyroid nodule    hx   Tinea corporis    Unspecified contraceptive management    Urinary tract infection    Vaginal Pap smear, abnormal     Surgeries: Procedure(s): ANTERIOR CERVICAL DECOMPRESSION/DISCECTOMY FUSION 3 LEVELS C4-7 on 06/04/2022   Consultants:   Discharged Condition: Improved  Hospital Course: [name] Ramos is an 57 y.o. female who was admitted 06/04/2022 for operative treatment of Cervical disc herniation. Patient failed conservative treatments (please see the history and physical for the specifics) and had severe unremitting pain that affects sleep, daily activities and work/hobbies. After pre-op clearance, the patient was taken to the operating room on 06/04/2022 and underwent  Procedure(s): ANTERIOR CERVICAL DECOMPRESSION/DISCECTOMY FUSION 3 LEVELS C4-7.    Patient was given perioperative antibiotics:  Anti-infectives (From admission, onward)    Start     Dose/Rate  Route Frequency Ordered Stop   06/04/22 1400  ceFAZolin (ANCEF) IVPB 1 g/50 mL premix        1 g 100 mL/hr over 30 Minutes Intravenous Every 8 hours 06/04/22 1249 06/04/22 2155   06/04/22 0559  ceFAZolin (ANCEF) IVPB 2g/100 mL premix        2 g 200 mL/hr over 30 Minutes Intravenous 30 min pre-op 06/04/22 0559 06/04/22 1136        Patient was given sequential compression devices and early ambulation to prevent DVT.   Patient benefited maximally from hospital stay and there were no complications. At the time of discharge, the patient was urinating/moving their bowels without difficulty, tolerating a regular diet, pain is controlled with oral pain medications and they have been cleared by PT/OT.   Recent vital signs: Patient Vitals for the past 24 hrs:  BP Temp Temp src Pulse Resp SpO2  06/05/22 0352 113/70 98.3 F (36.8 C) Oral 80 18 99 %  06/04/22 2312 116/73 98.4 F (36.9 C) Oral 94 18 98 %  06/04/22 2029 (!) 140/70 98.5 F (36.9 C) Oral 96 18 98 %  06/04/22 1610 124/75 98.1 F (36.7 C) Oral 83 16 100 %  06/04/22 1307 125/72 98.2 F (36.8 C) Oral 73 16 96 %  06/04/22 1230 119/66 -- -- 79 16 98 %  06/04/22 1215 116/65 -- -- 84 14 97 %  06/04/22 1204 120/74 (!) 97.5 F (36.4 C) -- 86 13 94 %     Recent laboratory studies:  No results for input(s): "WBC", "HGB", "HCT", "PLT", "NA", "K", "CL", "CO2", "BUN", "CREATININE", "GLUCOSE", "INR", "CALCIUM" in the last 72 hours.  Invalid input(s): "PT", "2"   Discharge Medications:   Allergies as of 06/05/2022       Reactions   Ace Inhibitors Other (See Comments)   Numbness, tingling of lips.   Bee Venom Swelling   Leg swelling   Lisinopril    Other reaction(s): Other (See Comments) other   Piroxicam Hives        Medication List     STOP taking these medications    albuterol 108 (90 Base) MCG/ACT inhaler Commonly known as: VENTOLIN HFA   aspirin 81 MG tablet   clobetasol ointment 0.05 % Commonly known as: TEMOVATE        TAKE these medications    ALPRAZolam 0.25 MG tablet Commonly known as: XANAX Take 1 tablet (0.25 mg total) by mouth 3 (three) times daily as needed for anxiety.   amLODipine 5 MG tablet Commonly known as: NORVASC Take 1 tablet (5 mg total) by mouth daily.   atenolol 100 MG tablet Commonly known as: TENORMIN Take 1 tablet (100 mg total) by mouth daily.   atorvastatin 10 MG tablet Commonly known as: LIPITOR Take 1 tablet (10 mg total) by mouth daily.   blood glucose meter kit and supplies Dispense based on patient and insurance preference. Use up to four times daily as directed. (FOR ICD-10 E10.9, E11.9).   EPINEPHrine 0.3 mg/0.3 mL Soaj injection Commonly known as: EPI-PEN Inject 0.3 mg into the muscle as needed for anaphylaxis.   hydrochlorothiazide 25 MG tablet Commonly known as: HYDRODIURIL Take 1 tablet (25 mg total) by mouth daily.   medroxyPROGESTERone Acetate 150 MG/ML Susy Inject 1 mL (150 mg total) as directed every 3 (three) months for 1 day.   metFORMIN 1000 MG tablet Commonly known as: GLUCOPHAGE Take 1 tablet (1,000 mg total) by mouth 2 (two) times daily with a meal.   methocarbamol 500 MG tablet Commonly known as: ROBAXIN Take 1 tablet (500 mg total) by mouth every 8 (eight) hours as needed for up to 5 days for muscle spasms. What changed: when to take this   omeprazole 40 MG capsule Commonly known as: PRILOSEC TAKE 1 CAPSULE BY MOUTH EVERY DAY   ondansetron 4 MG tablet Commonly known as: Zofran Take 1 tablet (4 mg total) by mouth every 8 (eight) hours as needed for nausea or vomiting.   oxyCODONE-acetaminophen 10-325 MG tablet Commonly known as: Percocet Take 1 tablet by mouth every 6 (six) hours as needed for up to 5 days for pain.   Ozempic (2 MG/DOSE) 8 MG/3ML Sopn Generic drug: Semaglutide (2 MG/DOSE) Inject 2 mg as directed once a week.        Diagnostic Studies: DG Cervical Spine 2 or 3 views  Result Date: 06/04/2022 CLINICAL  DATA:  C4-7 ACDF. EXAM: CERVICAL SPINE - 2-3 VIEW COMPARISON:  Limited correlation made with neck CT 12/07/2016. FINDINGS: C-arm fluoroscopy was provided in the operating room without the presence of a radiologist.1 minute and 8 seconds fluoroscopy time. 9.19 mGy air kerma. Four C-arm fluoroscopic images were obtained intraoperatively and are submitted for post operative interpretation. These images demonstrate cervical spine localization and subsequent ACDF from C4 through C7. The hardware appears well positioned, and no complications are identified on these C-arm images. Please see intraoperative findings for further detail. IMPRESSION: Intraoperative fluoroscopic guidance for C4-C7 ACDF. Electronically Signed   By: Caryl Comes.D.  On: 06/04/2022 11:50   DG C-Arm 1-60 Min-No Report  Result Date: 06/04/2022 Fluoroscopy was utilized by the requesting physician.  No radiographic interpretation.   DG C-Arm 1-60 Min-No Report  Result Date: 06/04/2022 Fluoroscopy was utilized by the requesting physician.  No radiographic interpretation.   DG C-Arm 1-60 Min-No Report  Result Date: 06/04/2022 Fluoroscopy was utilized by the requesting physician.  No radiographic interpretation.   DG C-Arm 1-60 Min-No Report  Result Date: 06/04/2022 Fluoroscopy was utilized by the requesting physician.  No radiographic interpretation.    Discharge Instructions     Incentive spirometry RT   Complete by: As directed         Follow-up Information     Venita Lick, MD. Schedule an appointment as soon as possible for a visit in 2 week(s).   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  This morning she reports minimal  discomfort in the cervical spine region.  She is tolerating a regular diet, voiding spontaneously, and having positive flatus.  She is neurologically intact with no focal motor deficits.  She is ambulating with no assistance.  Patient will be evaluated by the physical therapist and occupational therapist prior to her discharge.  She has been given appropriate discharge instructions as well as a prescription for Percocet, methocarbamol, and Zofran.  Patient will follow-up with me in 2 weeks for a wound check and reevaluation.  She knows to contact my office if any issues or problems arise.    Signed: Alvy Beal for Dr. Venita Lick [facility] [phone] 06/05/2022, 6:55 AM

## 2022-06-08 ENCOUNTER — Encounter (HOSPITAL_COMMUNITY): Payer: Self-pay | Admitting: Orthopedic Surgery

## 2022-06-19 DIAGNOSIS — M542 Cervicalgia: Secondary | ICD-10-CM | POA: Diagnosis not present

## 2022-06-25 LAB — HM DIABETES EYE EXAM

## 2022-07-13 ENCOUNTER — Other Ambulatory Visit (HOSPITAL_BASED_OUTPATIENT_CLINIC_OR_DEPARTMENT_OTHER): Payer: Self-pay

## 2022-07-16 DIAGNOSIS — M542 Cervicalgia: Secondary | ICD-10-CM | POA: Diagnosis not present

## 2022-07-21 ENCOUNTER — Other Ambulatory Visit: Payer: Self-pay | Admitting: Family Medicine

## 2022-07-21 DIAGNOSIS — K219 Gastro-esophageal reflux disease without esophagitis: Secondary | ICD-10-CM

## 2022-07-21 DIAGNOSIS — Z4889 Encounter for other specified surgical aftercare: Secondary | ICD-10-CM | POA: Diagnosis not present

## 2022-07-21 DIAGNOSIS — I10 Essential (primary) hypertension: Secondary | ICD-10-CM

## 2022-07-23 DIAGNOSIS — M542 Cervicalgia: Secondary | ICD-10-CM | POA: Diagnosis not present

## 2022-07-28 ENCOUNTER — Ambulatory Visit: Payer: Federal, State, Local not specified - PPO | Admitting: Family Medicine

## 2022-07-28 ENCOUNTER — Encounter: Payer: Self-pay | Admitting: Family Medicine

## 2022-07-28 ENCOUNTER — Other Ambulatory Visit (HOSPITAL_BASED_OUTPATIENT_CLINIC_OR_DEPARTMENT_OTHER): Payer: Self-pay

## 2022-07-28 VITALS — BP 122/80 | HR 76 | Temp 97.8°F | Resp 18 | Ht 65.0 in | Wt 176.4 lb

## 2022-07-28 DIAGNOSIS — N904 Leukoplakia of vulva: Secondary | ICD-10-CM | POA: Insufficient documentation

## 2022-07-28 DIAGNOSIS — R1013 Epigastric pain: Secondary | ICD-10-CM

## 2022-07-28 DIAGNOSIS — M542 Cervicalgia: Secondary | ICD-10-CM | POA: Diagnosis not present

## 2022-07-28 DIAGNOSIS — E785 Hyperlipidemia, unspecified: Secondary | ICD-10-CM

## 2022-07-28 DIAGNOSIS — F411 Generalized anxiety disorder: Secondary | ICD-10-CM

## 2022-07-28 DIAGNOSIS — F418 Other specified anxiety disorders: Secondary | ICD-10-CM

## 2022-07-28 DIAGNOSIS — Z3009 Encounter for other general counseling and advice on contraception: Secondary | ICD-10-CM

## 2022-07-28 DIAGNOSIS — I739 Peripheral vascular disease, unspecified: Secondary | ICD-10-CM

## 2022-07-28 DIAGNOSIS — F320 Major depressive disorder, single episode, mild: Secondary | ICD-10-CM

## 2022-07-28 DIAGNOSIS — T782XXA Anaphylactic shock, unspecified, initial encounter: Secondary | ICD-10-CM | POA: Diagnosis not present

## 2022-07-28 DIAGNOSIS — K449 Diaphragmatic hernia without obstruction or gangrene: Secondary | ICD-10-CM | POA: Insufficient documentation

## 2022-07-28 DIAGNOSIS — E1165 Type 2 diabetes mellitus with hyperglycemia: Secondary | ICD-10-CM

## 2022-07-28 DIAGNOSIS — I10 Essential (primary) hypertension: Secondary | ICD-10-CM | POA: Diagnosis not present

## 2022-07-28 DIAGNOSIS — F10930 Alcohol use, unspecified with withdrawal, uncomplicated: Secondary | ICD-10-CM

## 2022-07-28 DIAGNOSIS — E1169 Type 2 diabetes mellitus with other specified complication: Secondary | ICD-10-CM

## 2022-07-28 MED ORDER — SEMAGLUTIDE (2 MG/DOSE) 8 MG/3ML ~~LOC~~ SOPN
2.0000 mg | PEN_INJECTOR | SUBCUTANEOUS | 3 refills | Status: DC
  Filled 2022-07-28 – 2022-07-29 (×2): qty 3, 28d supply, fill #0
  Filled 2022-09-01: qty 3, 28d supply, fill #1
  Filled 2022-09-29: qty 3, 28d supply, fill #2
  Filled 2022-10-28: qty 3, 28d supply, fill #3

## 2022-07-28 MED ORDER — ATORVASTATIN CALCIUM 10 MG PO TABS: 10.0000 mg | ORAL_TABLET | Freq: Every day | ORAL | 3 refills | Status: DC

## 2022-07-28 MED ORDER — CLOBETASOL PROPIONATE 0.05 % EX OINT: TOPICAL_OINTMENT | CUTANEOUS | 5 refills | Status: DC

## 2022-07-28 MED ORDER — ALPRAZOLAM 0.25 MG PO TABS: 0.2500 mg | ORAL_TABLET | Freq: Three times a day (TID) | ORAL | 2 refills | Status: DC | PRN

## 2022-07-28 MED ORDER — HYDROCHLOROTHIAZIDE 25 MG PO TABS: 25.0000 mg | ORAL_TABLET | Freq: Every day | ORAL | 3 refills | Status: DC

## 2022-07-28 MED ORDER — ATENOLOL 100 MG PO TABS: 100.0000 mg | ORAL_TABLET | Freq: Every day | ORAL | 3 refills | Status: DC

## 2022-07-28 MED ORDER — EPINEPHRINE 0.3 MG/0.3ML IJ SOAJ: 0.3000 mg | INTRAMUSCULAR | 1 refills | Status: DC | PRN

## 2022-07-28 MED ORDER — MEDROXYPROGESTERONE ACETATE 150 MG/ML IM SUSY: 1.0000 mL | PREFILLED_SYRINGE | INTRAMUSCULAR | 2 refills | Status: DC

## 2022-07-28 NOTE — Assessment & Plan Note (Signed)
Stable  On as needed xanax only

## 2022-07-28 NOTE — Assessment & Plan Note (Signed)
Refill sent.

## 2022-07-28 NOTE — Assessment & Plan Note (Signed)
Pt no longer drinking

## 2022-07-28 NOTE — Patient Instructions (Signed)

## 2022-07-28 NOTE — Progress Notes (Signed)
Subjective:   By signing my name below, I, [name], attest that this documentation has been prepared under the direction and in the presence of Ann Held, DO. 07/28/2022   Patient ID: [name] Ramos, female    DOB: 1965-07-30, 57 y.o.   MRN: [id number]  Chief Complaint  Patient presents with   GI Problem    Pt states having gas issues while taking omeprazole     HPI Patient is in today for a office visit.   She reports having frequent belching while taking ozmepic. She continues taking 40 mg omeprazole daily PO and reports no change in her belching while taking it. Her belching has worsened over the past year.  She reports having hair loss from her previous surgeries anesthesia.  She is requesting a refill for 0.25 mg Xanax, 100 mg atenolol, 10 mg atorvastatin, clobetasol ointment, epinephrine injection, 25 mg hydrochlorothiazide, medroxyprogesterone acetate, 2 mg ozempic.  She reports her other provider took of aspirin and labetalol. She consulted her cardiologist who informed her that she needs to resume those medication immediately.   Past Medical History:  Diagnosis Date   Alcohol abuse    recovering alcoholic   Allergic urticaria    Anxiety    Cholecystitis, acute    Pt denies   Congenital anomaly of neck    Depression    Diabetes mellitus without complication (HCC)    Dyslipidemia    Followed by PCP   Gastroparesis    GERD (gastroesophageal reflux disease)    Hematuria    Hiatal hernia    with reflux   Hypertension    stress test 11/2010 normal   Kidney stone    Left bundle branch block (LBBB)    Obesity    PVC's (premature ventricular contractions)    Controlled on beta blocker therapy;  Echo 9/10: EF 0000000, grade 1 diastolic dysfunction, mild MR, mild LAE   Thyroid nodule    hx   Tinea corporis    Unspecified contraceptive management    Urinary tract infection    Vaginal Pap smear, abnormal     Past Surgical History:  Procedure  Laterality Date   ANTERIOR CERVICAL DECOMP/DISCECTOMY FUSION N/A 06/04/2022   Procedure: ANTERIOR CERVICAL DECOMPRESSION/DISCECTOMY FUSION 3 LEVELS C4-7;  Surgeon: Melina Schools, MD;  Location: [facility];  Service: Orthopedics;  Laterality: N/A;  3.5 hrs 3 C-Bed   leep surgery     mid 20's   TONSILLECTOMY      Family History  Problem Relation Age of Onset   Diabetes Mother    Liver disease Mother    Irritable bowel syndrome Mother    Colon polyps Mother    Thyroid disease Mother    Hypertension Mother    Breast cancer Mother 109   Hyperlipidemia Father    Hypertension Father    Lung cancer Maternal Uncle    Lung cancer Maternal Grandmother    Heart attack Maternal Grandmother 48   Diabetes Maternal Grandmother    Lung cancer Paternal Grandmother    Coronary artery disease Other        female 1st degree relative 55 yo   Hypertension Other    Colon cancer Neg Hx    Stomach cancer Neg Hx     Social History   Socioeconomic History   Marital status: Single    Spouse name: Not on file   Number of children: Not on file   Years of education: Not on file   Highest education  level: Not on file  Occupational History   Occupation: post office    Employer: [employer]  Tobacco Use   Smoking status: Former    Types: Cigarettes    Quit date: 06/16/2018    Years since quitting: 4.1   Smokeless tobacco: Never  Vaping Use   Vaping Use: Never used  Substance and Sexual Activity   Alcohol use: Yes    Comment: social drinker, ocassional   Drug use: No   Sexual activity: Not Currently    Partners: Male  Other Topics Concern   Not on file  Social History Narrative   Exercising---walking dog daily   Social Determinants of Health   Financial Resource Strain: Not on file  Food Insecurity: Not on file  Transportation Needs: Not on file  Physical Activity: Not on file  Stress: Not on file  Social Connections: Not on file  Intimate Partner Violence: Not on file    Outpatient  Medications Prior to Visit  Medication Sig Dispense Refill   amLODipine (NORVASC) 5 MG tablet TAKE 1 TABLET (5 MG TOTAL) BY MOUTH DAILY. 90 tablet 3   blood glucose meter kit and supplies Dispense based on patient and insurance preference. Use up to four times daily as directed. (FOR ICD-10 E10.9, E11.9). 1 each 0   metFORMIN (GLUCOPHAGE) 1000 MG tablet Take 1 tablet (1,000 mg total) by mouth 2 (two) times daily with a meal. 180 tablet 3   omeprazole (PRILOSEC) 40 MG capsule TAKE 1 CAPSULE BY MOUTH EVERY DAY 90 capsule 3   ondansetron (ZOFRAN) 4 MG tablet Take 1 tablet (4 mg total) by mouth every 8 (eight) hours as needed for nausea or vomiting. 20 tablet 0   ALPRAZolam (XANAX) 0.25 MG tablet Take 1 tablet (0.25 mg total) by mouth 3 (three) times daily as needed for anxiety. 30 tablet 2   atenolol (TENORMIN) 100 MG tablet Take 1 tablet (100 mg total) by mouth daily. 90 tablet 3   atorvastatin (LIPITOR) 10 MG tablet Take 1 tablet (10 mg total) by mouth daily. 90 tablet 3   EPINEPHrine 0.3 mg/0.3 mL IJ SOAJ injection Inject 0.3 mg into the muscle as needed for anaphylaxis. 2 each 1   hydrochlorothiazide (HYDRODIURIL) 25 MG tablet Take 1 tablet (25 mg total) by mouth daily. 90 tablet 3   Semaglutide, 2 MG/DOSE, 8 MG/3ML SOPN Inject 2 mg as directed once a week. 3 mL 3   medroxyPROGESTERone Acetate 150 MG/ML SUSY Inject 1 mL (150 mg total) as directed every 3 (three) months for 1 day. 1 mL 2   No facility-administered medications prior to visit.    Allergies  Allergen Reactions   Ace Inhibitors Other (See Comments)    Numbness, tingling of lips.   Bee Venom Swelling    Leg swelling   Lisinopril     Other reaction(s): Other (See Comments) other   Piroxicam Hives    Review of Systems  Constitutional:  Negative for fever and malaise/fatigue.  HENT:  Negative for congestion.   Eyes:  Negative for blurred vision.  Respiratory:  Negative for cough and shortness of breath.   Cardiovascular:   Negative for chest pain, palpitations and leg swelling.  Gastrointestinal:  Negative for vomiting.       (+) belching  Musculoskeletal:  Negative for back pain.  Skin:  Negative for rash.  Neurological:  Negative for loss of consciousness and headaches.       Objective:    Physical Exam Vitals and  nursing note reviewed.  Constitutional:      Appearance: She is well-developed.  HENT:     Head: Normocephalic and atraumatic.  Eyes:     Conjunctiva/sclera: Conjunctivae normal.  Neck:     Thyroid: No thyromegaly.     Vascular: No carotid bruit or JVD.  Cardiovascular:     Rate and Rhythm: Normal rate and regular rhythm.     Heart sounds: Normal heart sounds. No murmur heard. Pulmonary:     Effort: Pulmonary effort is normal. No respiratory distress.     Breath sounds: Normal breath sounds. No wheezing or rales.  Chest:     Chest wall: No tenderness.  Abdominal:     Palpations: Abdomen is soft.     Tenderness: There is no abdominal tenderness. There is no guarding or rebound.  Musculoskeletal:     Cervical back: Normal range of motion and neck supple.  Neurological:     Mental Status: She is alert and oriented to person, place, and time.     BP 122/80 (BP Location: Left Arm, Patient Position: Sitting, Cuff Size: Normal)   Pulse 76   Temp 97.8 F (36.6 C) (Oral)   Resp 18   Ht '5\' 5"'$  (1.651 m)   Wt 176 lb 6.4 oz (80 kg)   SpO2 100%   BMI 29.35 kg/m  Wt Readings from Last 3 Encounters:  07/28/22 176 lb 6.4 oz (80 kg)  06/04/22 174 lb (78.9 kg)  05/27/22 180 lb (81.6 kg)       Assessment & Plan:  Dyspepsia -     Ambulatory referral to Gastroenterology  Generalized anxiety disorder -     ALPRAZolam; Take 1 tablet (0.25 mg total) by mouth 3 (three) times daily as needed for anxiety.  Dispense: 30 tablet; Refill: 2  Essential hypertension Assessment & Plan: Well controlled, no changes to meds. Encouraged heart healthy diet such as the DASH diet and exercise as  tolerated.    Orders: -     Atenolol; Take 1 tablet (100 mg total) by mouth daily.  Dispense: 90 tablet; Refill: 3 -     hydroCHLOROthiazide; Take 1 tablet (25 mg total) by mouth daily.  Dispense: 90 tablet; Refill: 3  Hyperlipidemia LDL goal <100 Assessment & Plan: Tolerating statin, encouraged heart healthy diet, avoid trans fats, minimize simple carbs and saturated fats. Increase exercise as tolerated  Con't lipitor   Orders: -     Atorvastatin Calcium; Take 1 tablet (10 mg total) by mouth daily.  Dispense: 90 tablet; Refill: 3  Anaphylaxis, initial encounter -     EPINEPHrine; Inject 0.3 mg into the muscle as needed for anaphylaxis.  Dispense: 2 each; Refill: 1  Type 2 diabetes mellitus with hyperglycemia, without long-term current use of insulin (HCC) -     Semaglutide (2 MG/DOSE); Inject 2 mg as directed once a week.  Dispense: 3 mL; Refill: 3  Encounter for other general counseling or advice on contraception Assessment & Plan: Refill sent   Orders: -     medroxyPROGESTERone Acetate; Inject 1 mL (150 mg total) as directed every 3 (three) months for 1 day.  Dispense: 1 mL; Refill: 2  Leukoplakia of vulva -     Clobetasol Propionate; Apply to affected area every night for 4 weeks, then every other day for 4 weeks and then twice a week  Dispense: 60 g; Refill: 5  Hiatal hernia -     Ambulatory referral to Gastroenterology  Alcohol withdrawal syndrome without complication (  [place]) Assessment & Plan: Pt no longer drinking    Depression, major, single episode, mild (HCC)  PAD (peripheral artery disease) ([place])  Morbid obesity ([place])  Hyperlipidemia associated with type 2 diabetes mellitus ([place]) Assessment & Plan: Tolerating statin, encouraged heart healthy diet, avoid trans fats, minimize simple carbs and saturated fats. Increase exercise as tolerated  Con't lipitor    Depression with anxiety Assessment & Plan: Stable  On as needed xanax only     I, Ann Held, DO, personally preformed the services described in this documentation.  All medical record entries made by the scribe were at my direction and in my presence.  I have reviewed the chart and discharge instructions (if applicable) and agree that the record reflects my personal performance and is accurate and complete. 07/28/2022   I,[name],acting as a scribe for Ann Held, DO.,have documented all relevant documentation on the behalf of Ann Held, DO,as directed by  Ann Held, DO while in the presence of Ann Held, DO.   Ann Held, DO

## 2022-07-28 NOTE — Assessment & Plan Note (Signed)
Tolerating statin, encouraged heart healthy diet, avoid trans fats, minimize simple carbs and saturated fats. Increase exercise as tolerated  Con't lipitor

## 2022-07-28 NOTE — Assessment & Plan Note (Signed)
Well controlled, no changes to meds. Encouraged heart healthy diet such as the DASH diet and exercise as tolerated.  °

## 2022-07-29 ENCOUNTER — Other Ambulatory Visit (HOSPITAL_BASED_OUTPATIENT_CLINIC_OR_DEPARTMENT_OTHER): Payer: Self-pay

## 2022-07-29 ENCOUNTER — Other Ambulatory Visit: Payer: Self-pay

## 2022-07-31 DIAGNOSIS — M542 Cervicalgia: Secondary | ICD-10-CM | POA: Diagnosis not present

## 2022-08-03 DIAGNOSIS — M542 Cervicalgia: Secondary | ICD-10-CM | POA: Diagnosis not present

## 2022-08-04 ENCOUNTER — Encounter: Payer: Federal, State, Local not specified - PPO | Admitting: Family Medicine

## 2022-08-05 DIAGNOSIS — M542 Cervicalgia: Secondary | ICD-10-CM | POA: Diagnosis not present

## 2022-08-07 ENCOUNTER — Encounter: Payer: Self-pay | Admitting: Family Medicine

## 2022-08-07 ENCOUNTER — Ambulatory Visit (INDEPENDENT_AMBULATORY_CARE_PROVIDER_SITE_OTHER): Payer: Federal, State, Local not specified - PPO | Admitting: Family Medicine

## 2022-08-07 VITALS — BP 118/78 | HR 74 | Temp 98.6°F | Resp 18 | Ht 65.0 in | Wt 176.4 lb

## 2022-08-07 DIAGNOSIS — E1165 Type 2 diabetes mellitus with hyperglycemia: Secondary | ICD-10-CM

## 2022-08-07 DIAGNOSIS — K449 Diaphragmatic hernia without obstruction or gangrene: Secondary | ICD-10-CM

## 2022-08-07 DIAGNOSIS — E559 Vitamin D deficiency, unspecified: Secondary | ICD-10-CM | POA: Diagnosis not present

## 2022-08-07 DIAGNOSIS — Z0000 Encounter for general adult medical examination without abnormal findings: Secondary | ICD-10-CM

## 2022-08-07 DIAGNOSIS — I10 Essential (primary) hypertension: Secondary | ICD-10-CM

## 2022-08-07 DIAGNOSIS — E785 Hyperlipidemia, unspecified: Secondary | ICD-10-CM

## 2022-08-07 DIAGNOSIS — E1169 Type 2 diabetes mellitus with other specified complication: Secondary | ICD-10-CM

## 2022-08-07 DIAGNOSIS — M5020 Other cervical disc displacement, unspecified cervical region: Secondary | ICD-10-CM

## 2022-08-07 DIAGNOSIS — K219 Gastro-esophageal reflux disease without esophagitis: Secondary | ICD-10-CM

## 2022-08-07 DIAGNOSIS — F418 Other specified anxiety disorders: Secondary | ICD-10-CM

## 2022-08-07 LAB — LIPID PANEL
Cholesterol: 132 mg/dL (ref 0–200)
HDL: 56.6 mg/dL (ref >39.00)
LDL Cholesterol: 63 mg/dL (ref 0–99)
NonHDL: 75.89
Total CHOL/HDL Ratio: 2
Triglycerides: 66 mg/dL (ref 0.0–149.0)
VLDL: 13.2 mg/dL (ref 0.0–40.0)

## 2022-08-07 LAB — CBC WITH DIFFERENTIAL/PLATELET
Basophils Absolute: 0 10*3/uL (ref 0.0–0.1)
Basophils Relative: 0.6 % (ref 0.0–3.0)
Eosinophils Absolute: 0.1 10*3/uL (ref 0.0–0.7)
Eosinophils Relative: 1.8 % (ref 0.0–5.0)
HCT: 41.2 % (ref 36.0–46.0)
Hemoglobin: 13.7 g/dL (ref 12.0–15.0)
Lymphocytes Relative: 26.6 % (ref 12.0–46.0)
Lymphs Abs: 1.6 10*3/uL (ref 0.7–4.0)
MCHC: 33.3 g/dL (ref 30.0–36.0)
MCV: 94.5 fl (ref 78.0–100.0)
Monocytes Absolute: 0.4 10*3/uL (ref 0.1–1.0)
Monocytes Relative: 7.3 % (ref 3.0–12.0)
Neutro Abs: 3.9 10*3/uL (ref 1.4–7.7)
Neutrophils Relative %: 63.7 % (ref 43.0–77.0)
Platelets: 269 10*3/uL (ref 150.0–400.0)
RBC: 4.36 Mil/uL (ref 3.87–5.11)
RDW: 13.9 % (ref 11.5–15.5)
WBC: 6.1 10*3/uL (ref 4.0–10.5)

## 2022-08-07 LAB — VITAMIN D 25 HYDROXY (VIT D DEFICIENCY, FRACTURES): VITD: 46.12 ng/mL (ref 30.00–100.00)

## 2022-08-07 LAB — COMPREHENSIVE METABOLIC PANEL
ALT: 21 U/L (ref 0–35)
AST: 16 U/L (ref 0–37)
Albumin: 4.1 g/dL (ref 3.5–5.2)
Alkaline Phosphatase: 51 U/L (ref 39–117)
BUN: 14 mg/dL (ref 6–23)
CO2: 28 mEq/L (ref 19–32)
Calcium: 10 mg/dL (ref 8.4–10.5)
Chloride: 102 mEq/L (ref 96–112)
Creatinine, Ser: 0.71 mg/dL (ref 0.40–1.20)
GFR: 96.13 mL/min (ref >60.00)
Glucose, Bld: 94 mg/dL (ref 70–99)
Potassium: 4.1 mEq/L (ref 3.5–5.1)
Sodium: 139 mEq/L (ref 135–145)
Total Bilirubin: 0.6 mg/dL (ref 0.2–1.2)
Total Protein: 6.5 g/dL (ref 6.0–8.3)

## 2022-08-07 LAB — MICROALBUMIN / CREATININE URINE RATIO
Creatinine,U: 161.3 mg/dL
Microalb Creat Ratio: 0.5 mg/g (ref 0.0–30.0)
Microalb, Ur: 0.9 mg/dL (ref 0.0–1.9)

## 2022-08-07 LAB — HEMOGLOBIN A1C: Hgb A1c MFr Bld: 5.5 % (ref 4.6–6.5)

## 2022-08-07 NOTE — Patient Instructions (Signed)
Preventive Care 40-55 Years Old, Female Preventive care refers to lifestyle choices and visits with your health care provider that can promote health and wellness. Preventive care visits are also called wellness exams. What can I expect for my preventive care visit? Counseling Your health care provider may ask you questions about your: Medical history, including: Past medical problems. Family medical history. Pregnancy history. Current health, including: Menstrual cycle. Method of birth control. Emotional well-being. Home life and relationship well-being. Sexual activity and sexual health. Lifestyle, including: Alcohol, nicotine or tobacco, and drug use. Access to firearms. Diet, exercise, and sleep habits. Work and work environment. Sunscreen use. Safety issues such as seatbelt and bike helmet use. Physical exam Your health care provider will check your: Height and weight. These may be used to calculate your BMI (body mass index). BMI is a measurement that tells if you are at a healthy weight. Waist circumference. This measures the distance around your waistline. This measurement also tells if you are at a healthy weight and may help predict your risk of certain diseases, such as type 2 diabetes and high blood pressure. Heart rate and blood pressure. Body temperature. Skin for abnormal spots. What immunizations do I need?  Vaccines are usually given at various ages, according to a schedule. Your health care provider will recommend vaccines for you based on your age, medical history, and lifestyle or other factors, such as travel or where you work. What tests do I need? Screening Your health care provider may recommend screening tests for certain conditions. This may include: Lipid and cholesterol levels. Diabetes screening. This is done by checking your blood sugar (glucose) after you have not eaten for a while (fasting). Pelvic exam and Pap test. Hepatitis B test. Hepatitis C  test. HIV (human immunodeficiency virus) test. STI (sexually transmitted infection) testing, if you are at risk. Lung cancer screening. Colorectal cancer screening. Mammogram. Talk with your health care provider about when you should start having regular mammograms. This may depend on whether you have a family history of breast cancer. BRCA-related cancer screening. This may be done if you have a family history of breast, ovarian, tubal, or peritoneal cancers. Bone density scan. This is done to screen for osteoporosis. Talk with your health care provider about your test results, treatment options, and if necessary, the need for more tests. Follow these instructions at home: Eating and drinking  Eat a diet that includes fresh fruits and vegetables, whole grains, lean protein, and low-fat dairy products. Take vitamin and mineral supplements as recommended by your health care provider. Do not drink alcohol if: Your health care provider tells you not to drink. You are pregnant, may be pregnant, or are planning to become pregnant. If you drink alcohol: Limit how much you have to 0-1 drink a day. Know how much alcohol is in your drink. In the [place] one drink equals one 12 oz bottle of beer (355 mL), one 5 oz glass of wine (148 mL), or one 1 oz glass of hard liquor (44 mL). Lifestyle Brush your teeth every morning and night with fluoride toothpaste. Floss one time each day. Exercise for at least 30 minutes 5 or more days each week. Do not use any products that contain nicotine or tobacco. These products include cigarettes, chewing tobacco, and vaping devices, such as e-cigarettes. If you need help quitting, ask your health care provider. Do not use drugs. If you are sexually active, practice safe sex. Use a condom or other form of protection to   prevent STIs. If you do not wish to become pregnant, use a form of birth control. If you plan to become pregnant, see your health care provider for a  prepregnancy visit. Take aspirin only as told by your health care provider. Make sure that you understand how much to take and what form to take. Work with your health care provider to find out whether it is safe and beneficial for you to take aspirin daily. Find healthy ways to manage stress, such as: Meditation, yoga, or listening to music. Journaling. Talking to a trusted person. Spending time with friends and family. Minimize exposure to UV radiation to reduce your risk of skin cancer. Safety Always wear your seat belt while driving or riding in a vehicle. Do not drive: If you have been drinking alcohol. Do not ride with someone who has been drinking. When you are tired or distracted. While texting. If you have been using any mind-altering substances or drugs. Wear a helmet and other protective equipment during sports activities. If you have firearms in your house, make sure you follow all gun safety procedures. Seek help if you have been physically or sexually abused. What's next? Visit your health care provider once a year for an annual wellness visit. Ask your health care provider how often you should have your eyes and teeth checked. Stay up to date on all vaccines. This information is not intended to replace advice given to you by your health care provider. Make sure you discuss any questions you have with your health care provider. Document Revised: 11/13/2020 Document Reviewed: 11/13/2020 Elsevier Patient Education  2023 Elsevier Inc.  

## 2022-08-07 NOTE — Assessment & Plan Note (Signed)
Ghm utd Check labs see AVS  Diabetes Health Maintenance Due  Topic Date Due   HEMOGLOBIN A1C  11/26/2022   FOOT EXAM  02/06/2023   OPHTHALMOLOGY EXAM  06/26/2023  '

## 2022-08-07 NOTE — Assessment & Plan Note (Signed)
Stable

## 2022-08-07 NOTE — Assessment & Plan Note (Signed)
Well controlled, no changes to meds. Encouraged heart healthy diet such as the DASH diet and exercise as tolerated.  °

## 2022-08-07 NOTE — Assessment & Plan Note (Addendum)
With hiatal hernia GI pending  Con't omeprazole ---- can increase to bid if needed

## 2022-08-07 NOTE — Assessment & Plan Note (Signed)
Encourage heart healthy diet such as MIND or DASH diet, increase exercise, avoid trans fats, simple carbohydrates and processed foods, consider a krill or fish or flaxseed oil cap daily.  °

## 2022-08-07 NOTE — Assessment & Plan Note (Signed)
Tolerating statin, encouraged heart healthy diet, avoid trans fats, minimize simple carbs and saturated fats. Increase exercise as tolerated 

## 2022-08-07 NOTE — Assessment & Plan Note (Signed)
Omeprazole daily  GI pending

## 2022-08-07 NOTE — Progress Notes (Addendum)
Subjective:   By signing my name below, I, Marlana Latus, attest that this documentation has been prepared under the direction and in the presence of Ann Held, DO 08/07/22   Patient ID: [name] Ramos, female    DOB: September 04, 1965, 57 y.o.   MRN: [id number]  Chief Complaint  Patient presents with   Annual Exam    Pt states fasting     HPI Patient is in today for a comprehensive physical exam.   She had an anterior cervical surgery on 06/04/2022. She states the surgery went well and she has not had any pain.   She monitors her blood pressure at home and reports a reading of [id number] systolic.   She is compliant with Omeprazole 40 mg daily. She notes that if she misses a dose the experiences stabbing pain in her abdomen. She also takes alka seltzer fruit chews.  She stays active by walking on her elliptical.   Colonoscopy: She plans to schedule her colonoscopy in the summer when her parents are in town.   Last mammogram: 01/29/2022. Results were normal.   Last pap: 08/16/2020. Results were abnormal, LSIL.   Last bone density: 07/26/2019. Results were normal.   She is UTD on routine dental care. She visits the dentist every 6 months.   She is UTD on routine vision care. Her last visit was in 06/2022.  Past Medical History:  Diagnosis Date   Alcohol abuse    recovering alcoholic   Allergic urticaria    Anxiety    Cholecystitis, acute    Pt denies   Congenital anomaly of neck    Depression    Diabetes mellitus without complication (HCC)    Dyslipidemia    Followed by PCP   Gastroparesis    GERD (gastroesophageal reflux disease)    Hematuria    Hiatal hernia    with reflux   Hypertension    stress test 11/2010 normal   Kidney stone    Left bundle branch block (LBBB)    Obesity    PVC's (premature ventricular contractions)    Controlled on beta blocker therapy;  Echo 9/10: EF 0000000, grade 1 diastolic dysfunction, mild MR, mild LAE   Thyroid nodule    hx    Tinea corporis    Unspecified contraceptive management    Urinary tract infection    Vaginal Pap smear, abnormal     Past Surgical History:  Procedure Laterality Date   ANTERIOR CERVICAL DECOMP/DISCECTOMY FUSION N/A 06/04/2022   Procedure: ANTERIOR CERVICAL DECOMPRESSION/DISCECTOMY FUSION 3 LEVELS C4-7;  Surgeon: Melina Schools, MD;  Location: [facility];  Service: Orthopedics;  Laterality: N/A;  3.5 hrs 3 C-Bed   leep surgery     mid 20's   TONSILLECTOMY      Family History  Problem Relation Age of Onset   Diabetes Mother    Liver disease Mother    Irritable bowel syndrome Mother    Colon polyps Mother    Thyroid disease Mother    Hypertension Mother    Breast cancer Mother 56   Hyperlipidemia Father    Hypertension Father    Lung cancer Maternal Uncle    Lung cancer Maternal Grandmother    Heart attack Maternal Grandmother 48   Diabetes Maternal Grandmother    Lung cancer Paternal Grandmother    Coronary artery disease Other        female 1st degree relative 55 yo   Hypertension Other    Colon cancer Neg Hx  Stomach cancer Neg Hx     Social History   Socioeconomic History   Marital status: Single    Spouse name: Not on file   Number of children: Not on file   Years of education: Not on file   Highest education level: Not on file  Occupational History   Occupation: post office    Employer: [employer]  Tobacco Use   Smoking status: Former    Types: Cigarettes    Quit date: 06/16/2018    Years since quitting: 4.1   Smokeless tobacco: Never  Vaping Use   Vaping Use: Never used  Substance and Sexual Activity   Alcohol use: Yes    Comment: social drinker, ocassional   Drug use: No   Sexual activity: Not Currently    Partners: Male  Other Topics Concern   Not on file  Social History Narrative   Exercising--elliptical    Social Determinants of Health   Financial Resource Strain: Not on file  Food Insecurity: Not on file  Transportation Needs: Not on  file  Physical Activity: Not on file  Stress: Not on file  Social Connections: Not on file  Intimate Partner Violence: Not on file    Outpatient Medications Prior to Visit  Medication Sig Dispense Refill   ALPRAZolam (XANAX) 0.25 MG tablet Take 1 tablet (0.25 mg total) by mouth 3 (three) times daily as needed for anxiety. 30 tablet 2   amLODipine (NORVASC) 5 MG tablet TAKE 1 TABLET (5 MG TOTAL) BY MOUTH DAILY. 90 tablet 3   atenolol (TENORMIN) 100 MG tablet Take 1 tablet (100 mg total) by mouth daily. 90 tablet 3   atorvastatin (LIPITOR) 10 MG tablet Take 1 tablet (10 mg total) by mouth daily. 90 tablet 3   blood glucose meter kit and supplies Dispense based on patient and insurance preference. Use up to four times daily as directed. (FOR ICD-10 E10.9, E11.9). 1 each 0   clobetasol ointment (TEMOVATE) 0.05 % Apply to affected area every night for 4 weeks, then every other day for 4 weeks and then twice a week 60 g 5   EPINEPHrine 0.3 mg/0.3 mL IJ SOAJ injection Inject 0.3 mg into the muscle as needed for anaphylaxis. 2 each 1   hydrochlorothiazide (HYDRODIURIL) 25 MG tablet Take 1 tablet (25 mg total) by mouth daily. 90 tablet 3   metFORMIN (GLUCOPHAGE) 1000 MG tablet Take 1 tablet (1,000 mg total) by mouth 2 (two) times daily with a meal. 180 tablet 3   omeprazole (PRILOSEC) 40 MG capsule TAKE 1 CAPSULE BY MOUTH EVERY DAY 90 capsule 3   ondansetron (ZOFRAN) 4 MG tablet Take 1 tablet (4 mg total) by mouth every 8 (eight) hours as needed for nausea or vomiting. 20 tablet 0   Semaglutide, 2 MG/DOSE, 8 MG/3ML SOPN Inject 2 mg as directed once a week. 3 mL 3   medroxyPROGESTERone Acetate 150 MG/ML SUSY Inject 1 mL (150 mg total) as directed every 3 (three) months for 1 day. 1 mL 2   No facility-administered medications prior to visit.    Allergies  Allergen Reactions   Ace Inhibitors Other (See Comments)    Numbness, tingling of lips.   Bee Venom Swelling    Leg swelling   Lisinopril      Other reaction(s): Other (See Comments) other   Piroxicam Hives    Review of Systems  Constitutional:  Negative for fever and malaise/fatigue.  HENT:  Negative for congestion.   Eyes:  Negative for blurred vision.  Respiratory:  Negative for shortness of breath.   Cardiovascular:  Negative for chest pain, palpitations and leg swelling.  Gastrointestinal:  Positive for heartburn. Negative for abdominal pain, blood in stool and nausea.  Genitourinary:  Negative for dysuria and frequency.  Musculoskeletal:  Negative for falls.  Skin:  Negative for rash.  Neurological:  Negative for dizziness, loss of consciousness and headaches.  Endo/Heme/Allergies:  Negative for environmental allergies.  Psychiatric/Behavioral:  Negative for depression. The patient is not nervous/anxious.        Objective:    Physical Exam Vitals and nursing note reviewed.  Constitutional:      General: She is not in acute distress.    Appearance: Normal appearance.  HENT:     Head: Normocephalic and atraumatic.     Right Ear: External ear normal.     Left Ear: External ear normal.     Nose: Mucosal edema present. No nasal deformity or rhinorrhea.     Right Sinus: Maxillary sinus tenderness and frontal sinus tenderness present.     Left Sinus: Maxillary sinus tenderness and frontal sinus tenderness present.     Mouth/Throat:     Pharynx: No oropharyngeal exudate.  Eyes:     Extraocular Movements: Extraocular movements intact.     Pupils: Pupils are equal, round, and reactive to light.  Cardiovascular:     Rate and Rhythm: Normal rate and regular rhythm.     Pulses: Normal pulses.     Heart sounds: Normal heart sounds. No murmur heard.    No gallop.  Pulmonary:     Effort: Pulmonary effort is normal. No respiratory distress.     Breath sounds: Normal breath sounds. No wheezing or rales.  Musculoskeletal:     Cervical back: Normal range of motion and neck supple.  Lymphadenopathy:     Cervical: No  cervical adenopathy.  Skin:    General: Skin is warm and dry.  Neurological:     Mental Status: She is alert and oriented to person, place, and time.  Psychiatric:        Judgment: Judgment normal.     BP 118/78 (BP Location: Left Arm, Patient Position: Sitting, Cuff Size: Normal)   Pulse 74   Temp 98.6 F (37 C) (Oral)   Resp 18   Ht '5\' 5"'$  (1.651 m)   Wt 176 lb 6.4 oz (80 kg)   SpO2 99%   BMI 29.35 kg/m  Wt Readings from Last 3 Encounters:  08/07/22 176 lb 6.4 oz (80 kg)  07/28/22 176 lb 6.4 oz (80 kg)  06/04/22 174 lb (78.9 kg)       Assessment & Plan:  Preventative health care Assessment & Plan: Ghm utd Check labs see AVS  Diabetes Health Maintenance Due  Topic Date Due   HEMOGLOBIN A1C  11/26/2022   FOOT EXAM  02/06/2023   OPHTHALMOLOGY EXAM  06/26/2023  '  Orders: -     Lipid panel -     CBC with Differential/Platelet -     Comprehensive metabolic panel -     Hemoglobin A1c -     Microalbumin / creatinine urine ratio  Essential hypertension Assessment & Plan: Well controlled, no changes to meds. Encouraged heart healthy diet such as the DASH diet and exercise as tolerated.    Orders: -     CBC with Differential/Platelet  Hyperlipidemia LDL goal <100 -     Lipid panel -     Comprehensive metabolic panel  Type 2 diabetes mellitus with hyperglycemia, without long-term current use of insulin (HCC) Assessment & Plan: Encourage heart healthy diet such as MIND or DASH diet, increase exercise, avoid trans fats, simple carbohydrates and processed foods, consider a krill or fish or flaxseed oil cap daily.    Orders: -     Hemoglobin A1c -     Microalbumin / creatinine urine ratio  Morbid obesity ([place])  Uncontrolled type 2 diabetes mellitus with hyperglycemia (HCC) -     Hemoglobin A1c -     Microalbumin / creatinine urine ratio  Vitamin D deficiency -     VITAMIN D 25 Hydroxy (Vit-D Deficiency, Fractures)  Cervical disc herniation Assessment &  Plan: S/p surgery     Depression with anxiety Assessment & Plan: Stable    Gastroesophageal reflux disease, unspecified whether esophagitis present Assessment & Plan: With hiatal hernia GI pending  Con't omeprazole ---- can increase to bid if needed    Hiatal hernia Assessment & Plan: Omeprazole daily  GI pending    Hyperlipidemia associated with type 2 diabetes mellitus ([place]) Assessment & Plan: Tolerating statin, encouraged heart healthy diet, avoid trans fats, minimize simple carbs and saturated fats. Increase exercise as tolerated       I,Rachel Rivera,acting as a scribe for Ann Held, DO.,have documented all relevant documentation on the behalf of Ann Held, DO,as directed by  Ann Held, DO while in the presence of [place], DO, personally preformed the services described in this documentation.  All medical record entries made by the scribe were at my direction and in my presence.  I have reviewed the chart and discharge instructions (if applicable) and agree that the record reflects my personal performance and is accurate and complete. 08/07/22   Ann Held, DO

## 2022-08-07 NOTE — Assessment & Plan Note (Signed)
S/p surgery 

## 2022-08-10 DIAGNOSIS — M542 Cervicalgia: Secondary | ICD-10-CM | POA: Diagnosis not present

## 2022-08-12 DIAGNOSIS — M542 Cervicalgia: Secondary | ICD-10-CM | POA: Diagnosis not present

## 2022-08-17 DIAGNOSIS — M542 Cervicalgia: Secondary | ICD-10-CM | POA: Diagnosis not present

## 2022-08-20 DIAGNOSIS — M542 Cervicalgia: Secondary | ICD-10-CM | POA: Diagnosis not present

## 2022-08-24 ENCOUNTER — Other Ambulatory Visit (HOSPITAL_COMMUNITY)
Admission: RE | Admit: 2022-08-24 | Discharge: 2022-08-24 | Disposition: A | Discharge status: Home / Self Care | Payer: Federal, State, Local not specified - PPO | Source: Ambulatory Visit | Attending: Obstetrics and Gynecology | Admitting: Obstetrics and Gynecology

## 2022-08-24 ENCOUNTER — Ambulatory Visit (INDEPENDENT_AMBULATORY_CARE_PROVIDER_SITE_OTHER): Payer: Federal, State, Local not specified - PPO | Admitting: Obstetrics and Gynecology

## 2022-08-24 ENCOUNTER — Encounter: Payer: Self-pay | Admitting: Obstetrics and Gynecology

## 2022-08-24 VITALS — BP 114/66 | HR 79 | Ht 65.0 in | Wt 178.0 lb

## 2022-08-24 DIAGNOSIS — Z01419 Encounter for gynecological examination (general) (routine) without abnormal findings: Secondary | ICD-10-CM | POA: Insufficient documentation

## 2022-08-24 DIAGNOSIS — N904 Leukoplakia of vulva: Secondary | ICD-10-CM

## 2022-08-24 NOTE — Progress Notes (Signed)
ANNUAL EXAM Patient name: [name] Ramos MRN [id number]  Date of birth: 08/15/1965 Chief Complaint:   Gynecologic Exam  History of Present Illness:   [name] Ramos is a 57 y.o. G3P0030 being seen today for a routine annual exam.  Current complaints: none  Menstrual concerns? No   Breast or nipple changes? No  Contraception use? Yes  Sexually active? No   Would like lichen sclerosus added back to problem list as she is currently still using clobetasol for symptom management and medication had been mistakenly been removed  No LMP recorded. Patient has had an injection.     Last pap     Component Value Date/Time   DIAGPAP - Low grade squamous intraepithelial lesion (LSIL) (A) 08/16/2020 0906   DIAGPAP  07/18/2018 0000    NEGATIVE FOR INTRAEPITHELIAL LESIONS OR MALIGNANCY.   DIAGPAP (A) 02/04/2017 0000    ATYPICAL SQUAMOUS CELLS OF UNDETERMINED SIGNIFICANCE (ASC-US).   [place] Negative 08/16/2020 0906   ADEQPAP  08/16/2020 0906    Satisfactory for evaluation; transformation zone component PRESENT.   ADEQPAP  07/18/2018 0000    Satisfactory for evaluation  endocervical/transformation zone component PRESENT.   ADEQPAP (A) 02/04/2017 0000    Satisfactory for evaluation  endocervical/transformation zone component PRESENT.    Last mammogram: 12/2021 [facility] 1 Last colonoscopy: none on file - planned for summer     07/28/2022    3:39 PM 03/17/2022    5:36 PM 07/24/2021    8:29 AM 12/26/2020    8:28 AM 06/28/2020    8:43 AM  Depression screen PHQ 2/9  Decreased Interest 0 0 0 0 0  Down, Depressed, Hopeless 0 0 0 0 0  PHQ - 2 Score 0 0 0 0 0  Altered sleeping 0 0   0  Tired, decreased energy  0   0  Change in appetite 0 0   1  Feeling bad or failure about yourself  0 0   0  Trouble concentrating 0 0   0  Moving slowly or fidgety/restless 0 0   0  Suicidal thoughts 0 0   0  PHQ-9 Score 0 0   1  Difficult doing work/chores Not difficult at all    Not difficult at all          No data to display           Review of Systems:   Pertinent items are noted in HPI Denies any headaches, blurred vision, fatigue, shortness of breath, chest pain, abdominal pain, abnormal vaginal discharge/itching/odor/irritation, problems with periods, bowel movements, urination, or intercourse unless otherwise stated above. Pertinent History Reviewed:  Reviewed past medical,surgical, social and family history.  Reviewed problem list, medications and allergies. Physical Assessment:   Vitals:   08/24/22 0848  BP: 114/66  Pulse: 79  Weight: 178 lb (80.7 kg)  Height: 5\' 5"  (1.651 m)  Body mass index is 29.62 kg/m.        Physical Examination:   General appearance - well appearing, and in no distress  Mental status - alert, oriented to person, place, and time  Psych:  She has a normal mood and affect  Skin - warm and dry, normal color, no suspicious lesions noted  Chest - effort normal, all lung fields clear to auscultation bilaterally  Heart - normal rate and regular rhythm  Breasts - breasts appear normal, no suspicious masses, no skin or nipple changes or  axillary nodes  Abdomen - soft, nontender, nondistended, no masses or  organomegaly  Pelvic -  VULVA: normal appearing vulva with no masses, tenderness or lesions, narrowed introitus, normal appearing urethra without caruncle, pallor of surrounding mucosa  VAGINA: normal appearing vagina with normal color and discharge, no lesions   CERVIX: small, atrophic cervix without discharge or lesions, no CMT  Thin prep pap is done with HR HPV cotesting  Extremities:  No swelling or varicosities noted  Chaperone present for exam  No results found for this or any previous visit (from the past 24 hour(s)).    Assessment & Plan:  1. Well woman exam with routine gynecological exam Now s/p pap smear Mammo ordered to remain UTD - Cytology - PAP( [place]) - MM 3D SCREENING MAMMOGRAM BILATERAL BREAST; Future  2. Lichen  sclerosus of female genitalia Added back to problem list, well controlled with topical clobetasol    Orders Placed This Encounter  Procedures   MM 3D SCREENING MAMMOGRAM BILATERAL BREAST    Meds: No orders of the defined types were placed in this encounter.   Follow-up: No follow-ups on file.  Darliss Cheney, MD 08/24/2022 11:07 AM

## 2022-08-27 DIAGNOSIS — M542 Cervicalgia: Secondary | ICD-10-CM | POA: Diagnosis not present

## 2022-08-27 LAB — CYTOLOGY - PAP
Comment: NEGATIVE
Diagnosis: NEGATIVE
High risk HPV: NEGATIVE

## 2022-09-01 DIAGNOSIS — Z4889 Encounter for other specified surgical aftercare: Secondary | ICD-10-CM | POA: Diagnosis not present

## 2022-11-10 DIAGNOSIS — Z4889 Encounter for other specified surgical aftercare: Secondary | ICD-10-CM | POA: Diagnosis not present

## 2022-11-24 ENCOUNTER — Other Ambulatory Visit: Payer: Self-pay | Admitting: Family Medicine

## 2022-11-24 DIAGNOSIS — E1165 Type 2 diabetes mellitus with hyperglycemia: Secondary | ICD-10-CM

## 2022-11-25 ENCOUNTER — Other Ambulatory Visit (HOSPITAL_BASED_OUTPATIENT_CLINIC_OR_DEPARTMENT_OTHER): Payer: Self-pay

## 2022-11-25 MED ORDER — OZEMPIC (2 MG/DOSE) 8 MG/3ML ~~LOC~~ SOPN
2.0000 mg | PEN_INJECTOR | SUBCUTANEOUS | 3 refills | Status: DC
  Filled 2022-11-25: qty 3, 28d supply, fill #0
  Filled 2022-12-22: qty 3, 28d supply, fill #1
  Filled 2023-01-19: qty 3, 28d supply, fill #2
  Filled 2023-02-16: qty 3, 28d supply, fill #3

## 2023-01-14 ENCOUNTER — Encounter (INDEPENDENT_AMBULATORY_CARE_PROVIDER_SITE_OTHER): Payer: Self-pay

## 2023-02-06 ENCOUNTER — Other Ambulatory Visit: Payer: Self-pay | Admitting: Family Medicine

## 2023-02-06 DIAGNOSIS — F411 Generalized anxiety disorder: Secondary | ICD-10-CM

## 2023-02-08 NOTE — Telephone Encounter (Signed)
Requesting: alprazolam 0.25mg   Contract:07/06/2018 UDS:06/21/2018 Last Visit: 08/07/22 Next Visit: 02/09/23  Last Refill: 07/28/22 #30 and 2RF   Please Advise

## 2023-02-09 ENCOUNTER — Encounter: Payer: Self-pay | Admitting: Family Medicine

## 2023-02-09 ENCOUNTER — Ambulatory Visit: Payer: Federal, State, Local not specified - PPO | Admitting: Family Medicine

## 2023-02-09 VITALS — BP 120/80 | HR 71 | Temp 98.4°F | Resp 18 | Ht 65.0 in | Wt 163.2 lb

## 2023-02-09 DIAGNOSIS — I10 Essential (primary) hypertension: Secondary | ICD-10-CM

## 2023-02-09 DIAGNOSIS — Z7985 Long-term (current) use of injectable non-insulin antidiabetic drugs: Secondary | ICD-10-CM

## 2023-02-09 DIAGNOSIS — E785 Hyperlipidemia, unspecified: Secondary | ICD-10-CM | POA: Diagnosis not present

## 2023-02-09 DIAGNOSIS — E1165 Type 2 diabetes mellitus with hyperglycemia: Secondary | ICD-10-CM | POA: Diagnosis not present

## 2023-02-09 DIAGNOSIS — E1169 Type 2 diabetes mellitus with other specified complication: Secondary | ICD-10-CM

## 2023-02-09 LAB — COMPREHENSIVE METABOLIC PANEL
ALT: 22 U/L (ref 0–35)
AST: 18 U/L (ref 0–37)
Albumin: 4.2 g/dL (ref 3.5–5.2)
Alkaline Phosphatase: 44 U/L (ref 39–117)
BUN: 11 mg/dL (ref 6–23)
CO2: 28 meq/L (ref 19–32)
Calcium: 9.9 mg/dL (ref 8.4–10.5)
Chloride: 103 meq/L (ref 96–112)
Creatinine, Ser: 0.63 mg/dL (ref 0.40–1.20)
GFR: 99.94 mL/min (ref >60.00)
Glucose, Bld: 83 mg/dL (ref 70–99)
Potassium: 3.8 meq/L (ref 3.5–5.1)
Sodium: 140 meq/L (ref 135–145)
Total Bilirubin: 0.8 mg/dL (ref 0.2–1.2)
Total Protein: 6.7 g/dL (ref 6.0–8.3)

## 2023-02-09 LAB — LIPID PANEL
Cholesterol: 134 mg/dL (ref 0–200)
HDL: 54.3 mg/dL (ref >39.00)
LDL Cholesterol: 65 mg/dL (ref 0–99)
NonHDL: 79.65
Total CHOL/HDL Ratio: 2
Triglycerides: 71 mg/dL (ref 0.0–149.0)
VLDL: 14.2 mg/dL (ref 0.0–40.0)

## 2023-02-09 LAB — CBC WITH DIFFERENTIAL/PLATELET
Basophils Absolute: 0 10*3/uL (ref 0.0–0.1)
Basophils Relative: 0.6 % (ref 0.0–3.0)
Eosinophils Absolute: 0.1 10*3/uL (ref 0.0–0.7)
Eosinophils Relative: 1.1 % (ref 0.0–5.0)
HCT: 40.3 % (ref 36.0–46.0)
Hemoglobin: 13.4 g/dL (ref 12.0–15.0)
Lymphocytes Relative: 29.6 % (ref 12.0–46.0)
Lymphs Abs: 1.9 10*3/uL (ref 0.7–4.0)
MCHC: 33.1 g/dL (ref 30.0–36.0)
MCV: 95.1 fl (ref 78.0–100.0)
Monocytes Absolute: 0.5 10*3/uL (ref 0.1–1.0)
Monocytes Relative: 7.5 % (ref 3.0–12.0)
Neutro Abs: 3.9 10*3/uL (ref 1.4–7.7)
Neutrophils Relative %: 61.2 % (ref 43.0–77.0)
Platelets: 300 10*3/uL (ref 150.0–400.0)
RBC: 4.24 Mil/uL (ref 3.87–5.11)
RDW: 12.3 % (ref 11.5–15.5)
WBC: 6.4 10*3/uL (ref 4.0–10.5)

## 2023-02-09 LAB — MICROALBUMIN / CREATININE URINE RATIO
Creatinine,U: 99.7 mg/dL
Microalb Creat Ratio: 0.7 mg/g (ref 0.0–30.0)
Microalb, Ur: <0.7 mg/dL (ref 0.0–1.9)

## 2023-02-09 LAB — HEMOGLOBIN A1C: Hgb A1c MFr Bld: 5.6 % (ref 4.6–6.5)

## 2023-02-09 NOTE — Patient Instructions (Signed)

## 2023-02-09 NOTE — Assessment & Plan Note (Signed)
hgba1c to be checked, minimize simple carbs. Increase exercise as tolerated. Continue current meds  

## 2023-02-09 NOTE — Progress Notes (Signed)
Established Patient Office Visit  Subjective   Patient ID: [name] Ramos, female    DOB: 06/21/1965  Age: 57 y.o. MRN: [id number]  Chief Complaint  Patient presents with   Hypertension   Hyperlipidemia   Diabetes   Follow-up    HPI Discussed the use of AI scribe software for clinical note transcription with the patient, who gave verbal consent to proceed.  History of Present Illness   The patient, with a history of obesity and constipation, presents for a routine follow-up. She has lost 70 pounds, from 228 to 160, with the help of Ozempic. She denies needing any medication refills and has an excess supply of Xanax at home. Her blood sugars, checked infrequently, are in the 110s. She has a mammogram scheduled for tomorrow and a colonoscopy in two weeks. She has been constipated for a year and a half and is concerned about the two-day cleanse prescribed for the colonoscopy. She has been taking Metamucil gummies for additional fiber intake. She is also concerned about maintaining her weight loss and is considering a gym membership to build muscle.      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   Uncontrolled type 2 diabetes mellitus with hyperglycemia (HCC) 02/06/2021   Hyperglycemia 12/26/2020   Depression, major, single episode, mild (HCC) 12/26/2020   PAD (peripheral artery disease) (HCC) 12/26/2020   Morbid obesity (HCC) 12/26/2020   Hyperlipidemia associated  with type 2 diabetes mellitus (HCC) 07/19/2020   Diet-controlled diabetes mellitus (HCC) 07/19/2020   Ruptured disc, cervical 06/29/2019   Preventative health care 06/29/2019   Mid back pain 02/20/2019   Psoriasis 02/04/2017   Left bundle branch block 09/02/2015   Chest pain 08/31/2015   Ingrown nail 12/04/2014   Pain in toe of left foot 12/04/2014   Obesity (BMI 30-39.9) 05/01/2013   Tremor 04/15/2012   Nausea and vomiting 04/01/2012   Depression with anxiety 01/25/2012   Alcohol abuse 01/22/2012   PVC (premature ventricular contraction) 09/09/2011   Hordeolum externum of left upper eyelid 12/15/2010   Hair loss 10/16/2010   Lymph node enlargement 10/16/2010   Eczema 10/16/2010   VAGINITIS 04/22/2010   HEMATURIA, HX OF 04/22/2010   Hyperlipidemia 04/10/2010   WART, RIGHT HAND 03/21/2010   DIZZINESS 02/27/2009   FECAL INCONTINENCE 02/21/2009   HIATAL HERNIA WITH REFLUX, HX OF 02/21/2009   CHOLECYSTITIS - ACUTE 08/07/2008   ALLERGIC URTICARIA 03/16/2008   TINEA CORPORIS 01/20/2008   GERD 09/02/2007   DEPRESSION 02/24/2007   Essential hypertension 01/21/2007   HEMATURIA 01/21/2007   ANOMALY, 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   THYROID NODULE, HX OF 01/21/2007   Past Medical History:  Diagnosis Date   Alcohol abuse    recovering alcoholic   Allergic urticaria    Anxiety    Cholecystitis, acute    Pt denies   Congenital anomaly of neck    Depression    Diabetes mellitus without complication (HCC)    Dyslipidemia    Followed by PCP   Gastroparesis  GERD (gastroesophageal reflux disease)    Hematuria    Hiatal hernia    with reflux   Hypertension    stress test 11/2010 normal   Kidney stone    Left bundle branch block (LBBB)    Obesity    PVC's (premature ventricular contractions)    Controlled on beta blocker therapy;  Echo 9/10: EF 55-60%, grade 1 diastolic dysfunction, mild MR, mild LAE   Thyroid nodule    hx   Tinea corporis    Unspecified contraceptive  management    Urinary tract infection    Vaginal Pap smear, abnormal    Past Surgical History:  Procedure Laterality Date   ANTERIOR CERVICAL DECOMP/DISCECTOMY FUSION N/A 06/04/2022   Procedure: ANTERIOR CERVICAL DECOMPRESSION/DISCECTOMY FUSION 3 LEVELS C4-7;  Surgeon: Venita Lick, MD;  Location: [facility] OR;  Service: Orthopedics;  Laterality: N/A;  3.5 hrs 3 C-Bed   leep surgery     mid 20's   TONSILLECTOMY     Social History   Tobacco Use   Smoking status: Former    Current packs/day: 0.00    Types: Cigarettes    Quit date: 06/16/2018    Years since quitting: 4.6   Smokeless tobacco: Never  Vaping Use   Vaping status: Never Used  Substance Use Topics   Alcohol use: Yes    Comment: social drinker, ocassional   Drug use: No   Social History   Socioeconomic History   Marital status: Single    Spouse name: Not on file   Number of children: Not on file   Years of education: Not on file   Highest education level: Not on file  Occupational History   Occupation: post office    Employer: [employer]  Tobacco Use   Smoking status: Former    Current packs/day: 0.00    Types: Cigarettes    Quit date: 06/16/2018    Years since quitting: 4.6   Smokeless tobacco: Never  Vaping Use   Vaping status: Never Used  Substance and Sexual Activity   Alcohol use: Yes    Comment: social drinker, ocassional   Drug use: No   Sexual activity: Not Currently    Partners: Male  Other Topics Concern   Not on file  Social History Narrative   Exercising--elliptical    Social Determinants of Health   Financial Resource Strain: Not on file  Food Insecurity: Not on file  Transportation Needs: Not on file  Physical Activity: Not on file  Stress: Not on file  Social Connections: Not on file  Intimate Partner Violence: Not on file   Family Status  Relation Name Status   Mother  Alive   Father  Alive   Brother  Alive   Mat Uncle  (Not Specified)   MGM  (Not Specified)   PGM  (Not  Specified)   Other  (Not Specified)   Neg Hx  (Not Specified)  No partnership data on file   Family History  Problem Relation Age of Onset   Diabetes Mother    Liver disease Mother    Irritable bowel syndrome Mother    Colon polyps Mother    Thyroid disease Mother    Hypertension Mother    Breast cancer Mother 21   Hyperlipidemia Father    Hypertension Father    Lung cancer Maternal Uncle    Lung cancer Maternal Grandmother    Heart attack Maternal Grandmother 48   Diabetes Maternal Grandmother    Lung cancer Paternal  Grandmother    Coronary artery disease Other        female 1st degree relative 55 yo   Hypertension Other    Colon cancer Neg Hx    Stomach cancer Neg Hx    Allergies  Allergen Reactions   Ace Inhibitors Other (See Comments)    Numbness, tingling of lips.   Bee Venom Swelling    Leg swelling   Lisinopril     Other reaction(s): Other (See Comments) other   Piroxicam Hives      Review of Systems  Constitutional:  Negative for chills, fever and malaise/fatigue.  HENT:  Negative for congestion and hearing loss.   Eyes:  Negative for blurred vision and discharge.  Respiratory:  Negative for cough, sputum production and shortness of breath.   Cardiovascular:  Negative for chest pain, palpitations and leg swelling.  Gastrointestinal:  Negative for abdominal pain, blood in stool, constipation, diarrhea, heartburn, nausea and vomiting.  Genitourinary:  Negative for dysuria, frequency, hematuria and urgency.  Musculoskeletal:  Negative for back pain, falls and myalgias.  Skin:  Negative for rash.  Neurological:  Negative for dizziness, sensory change, loss of consciousness, weakness and headaches.  Endo/Heme/Allergies:  Negative for environmental allergies. Does not bruise/bleed easily.  Psychiatric/Behavioral:  Negative for depression and suicidal ideas. The patient is not nervous/anxious and does not have insomnia.       Objective:     BP 120/80 (BP  Location: Left Arm, Patient Position: Sitting, Cuff Size: Normal)   Pulse 71   Temp 98.4 F (36.9 C) (Oral)   Resp 18   Ht 5\' 5"  (1.651 m)   Wt 163 lb 3.2 oz (74 kg)   SpO2 98%   BMI 27.16 kg/m  BP Readings from Last 3 Encounters:  02/09/23 120/80  08/24/22 114/66  08/07/22 118/78   Wt Readings from Last 3 Encounters:  02/09/23 163 lb 3.2 oz (74 kg)  08/24/22 178 lb (80.7 kg)  08/07/22 176 lb 6.4 oz (80 kg)   SpO2 Readings from Last 3 Encounters:  02/09/23 98%  08/07/22 99%  07/28/22 100%      Physical Exam Vitals and nursing note reviewed.  Constitutional:      General: She is not in acute distress.    Appearance: Normal appearance. She is well-developed.  HENT:     Head: Normocephalic and atraumatic.  Eyes:     General: No scleral icterus.       Right eye: No discharge.        Left eye: No discharge.  Cardiovascular:     Rate and Rhythm: Normal rate and regular rhythm.     Heart sounds: No murmur heard. Pulmonary:     Effort: Pulmonary effort is normal. No respiratory distress.     Breath sounds: Normal breath sounds.  Musculoskeletal:        General: Normal range of motion.     Cervical back: Normal range of motion and neck supple.     Right lower leg: No edema.     Left lower leg: No edema.  Skin:    General: Skin is warm and dry.  Neurological:     Mental Status: She is alert and oriented to person, place, and time.  Psychiatric:        Mood and Affect: Mood normal.        Behavior: Behavior normal.        Thought Content: Thought content normal.        Judgment:  Judgment normal.      No results found for any visits on 02/09/23.  Last CBC Lab Results  Component Value Date   WBC 6.1 08/07/2022   HGB 13.7 08/07/2022   HCT 41.2 08/07/2022   MCV 94.5 08/07/2022   MCH 31.5 05/27/2022   RDW 13.9 08/07/2022   PLT 269.0 08/07/2022   Last metabolic panel Lab Results  Component Value Date   GLUCOSE 94 08/07/2022   NA 139 08/07/2022   K 4.1  08/07/2022   CL 102 08/07/2022   CO2 28 08/07/2022   BUN 14 08/07/2022   CREATININE 0.71 08/07/2022   GFR 96.13 08/07/2022   CALCIUM 10.0 08/07/2022   PROT 6.5 08/07/2022   ALBUMIN 4.1 08/07/2022   BILITOT 0.6 08/07/2022   ALKPHOS 51 08/07/2022   AST 16 08/07/2022   ALT 21 08/07/2022   ANIONGAP 8 05/27/2022   Last lipids Lab Results  Component Value Date   CHOL 132 08/07/2022   HDL 56.60 08/07/2022   LDLCALC 63 08/07/2022   LDLDIRECT 148.1 01/20/2008   TRIG 66.0 08/07/2022   CHOLHDL 2 08/07/2022   Last hemoglobin A1c Lab Results  Component Value Date   HGBA1C 5.5 08/07/2022   Last thyroid functions Lab Results  Component Value Date   TSH 0.74 02/05/2022   Last vitamin D Lab Results  Component Value Date   VD25OH 46.12 08/07/2022   Last vitamin B12 and Folate Lab Results  Component Value Date   VITAMINB12 731 12/28/2019   FOLATE >24.8 12/28/2019      The 10-year ASCVD risk score (Arnett DK, et al., 2019) is: 2.8%    Assessment & Plan:   Problem List Items Addressed This Visit       Unprioritized   Morbid obesity (HCC)   Relevant Orders   Insulin, random   Uncontrolled type 2 diabetes mellitus with hyperglycemia (HCC)    Hgba1c to be checked ,  minimize simple carbs. Increase exercise as tolerated. Continue current meds       Type 2 diabetes mellitus with hyperglycemia, without long-term current use of insulin (HCC)    hgba1c to be checked , minimize simple carbs. Increase exercise as tolerated. Continue current meds       Relevant Orders   Hemoglobin A1c   Microalbumin / creatinine urine ratio   Insulin, random   Hyperlipidemia associated with type 2 diabetes mellitus (HCC)    Encourage heart healthy diet such as MIND or DASH diet, increase exercise, avoid trans fats, simple carbohydrates and processed foods, consider a krill or fish or flaxseed oil cap daily.        Essential hypertension - Primary    Well controlled, no changes to meds.  Encouraged heart healthy diet such as the DASH diet and exercise as tolerated.        Relevant Orders   CBC with Differential/Platelet   Comprehensive metabolic panel   Other Visit Diagnoses     Hyperlipidemia LDL goal <100       Relevant Orders   Lipid panel   CBC with Differential/Platelet   Comprehensive metabolic panel     Assessment and Plan    Weight Loss Significant weight loss (70 lbs) with Ozempic. Discussed the importance of maintaining a balanced diet with adequate protein intake to prevent weight regain. -Continue Ozempic at current dose. -Consider dose reduction once desired weight is achieved to maintain weight.  Diabetes Blood sugars in the 110s. -Continue current management. -Check insulin resistance today.  Anxiety  Xanax refill not needed. -Advised to inform pharmacy that refill is not needed.  Preventive Health Mammogram scheduled for tomorrow and colonoscopy in two weeks. -Continue with scheduled screenings.  Constipation Using Metamucil gummies for fiber supplementation. -Continue current management.  Follow-up Lab work today to check insulin resistance.        Return in about 6 months (around 08/09/2023), or if symptoms worsen or fail to improve, for annual exam, fasting.    Donato Schultz, DO

## 2023-02-09 NOTE — Assessment & Plan Note (Signed)
Encourage heart healthy diet such as MIND or DASH diet, increase exercise, avoid trans fats, simple carbohydrates and processed foods, consider a krill or fish or flaxseed oil cap daily.  °

## 2023-02-09 NOTE — Assessment & Plan Note (Signed)
Hgba1c to be checked , minimize simple carbs. Increase exercise as tolerated. Continue current meds

## 2023-02-09 NOTE — Assessment & Plan Note (Signed)
Well controlled, no changes to meds. Encouraged heart healthy diet such as the DASH diet and exercise as tolerated.  °

## 2023-02-10 ENCOUNTER — Ambulatory Visit (INDEPENDENT_AMBULATORY_CARE_PROVIDER_SITE_OTHER): Payer: Federal, State, Local not specified - PPO

## 2023-02-10 DIAGNOSIS — Z01419 Encounter for gynecological examination (general) (routine) without abnormal findings: Secondary | ICD-10-CM | POA: Diagnosis not present

## 2023-02-10 DIAGNOSIS — Z1231 Encounter for screening mammogram for malignant neoplasm of breast: Secondary | ICD-10-CM | POA: Diagnosis not present

## 2023-02-10 LAB — INSULIN, RANDOM: Insulin: 9.1 u[IU]/mL

## 2023-02-22 DIAGNOSIS — Z1211 Encounter for screening for malignant neoplasm of colon: Secondary | ICD-10-CM | POA: Diagnosis not present

## 2023-02-22 DIAGNOSIS — D120 Benign neoplasm of cecum: Secondary | ICD-10-CM | POA: Diagnosis not present

## 2023-02-22 DIAGNOSIS — K648 Other hemorrhoids: Secondary | ICD-10-CM | POA: Diagnosis not present

## 2023-02-22 DIAGNOSIS — Z86010 Personal history of colonic polyps: Secondary | ICD-10-CM | POA: Diagnosis not present

## 2023-02-22 DIAGNOSIS — K640 First degree hemorrhoids: Secondary | ICD-10-CM | POA: Diagnosis not present

## 2023-02-22 DIAGNOSIS — K5730 Diverticulosis of large intestine without perforation or abscess without bleeding: Secondary | ICD-10-CM | POA: Diagnosis not present

## 2023-02-22 DIAGNOSIS — K635 Polyp of colon: Secondary | ICD-10-CM | POA: Diagnosis not present

## 2023-03-15 ENCOUNTER — Other Ambulatory Visit: Payer: Self-pay | Admitting: Family Medicine

## 2023-03-15 ENCOUNTER — Other Ambulatory Visit (HOSPITAL_BASED_OUTPATIENT_CLINIC_OR_DEPARTMENT_OTHER): Payer: Self-pay

## 2023-03-15 DIAGNOSIS — E1165 Type 2 diabetes mellitus with hyperglycemia: Secondary | ICD-10-CM

## 2023-03-15 MED ORDER — OZEMPIC (2 MG/DOSE) 8 MG/3ML ~~LOC~~ SOPN
2.0000 mg | PEN_INJECTOR | SUBCUTANEOUS | 3 refills | Status: DC
  Filled 2023-03-15: qty 3, 28d supply, fill #0
  Filled 2023-04-12: qty 3, 28d supply, fill #1
  Filled 2023-05-11: qty 3, 28d supply, fill #2

## 2023-04-06 ENCOUNTER — Telehealth: Payer: Self-pay

## 2023-04-06 NOTE — Telephone Encounter (Signed)
PA initiated via Covermymeds; KEY: [id number]   PA approved. Effective 03/07/2023 through 04/05/2024

## 2023-04-09 DIAGNOSIS — M25511 Pain in right shoulder: Secondary | ICD-10-CM | POA: Diagnosis not present

## 2023-04-15 ENCOUNTER — Other Ambulatory Visit: Payer: Self-pay | Admitting: Family Medicine

## 2023-04-15 DIAGNOSIS — E1165 Type 2 diabetes mellitus with hyperglycemia: Secondary | ICD-10-CM

## 2023-04-16 ENCOUNTER — Ambulatory Visit: Payer: Federal, State, Local not specified - PPO | Attending: Internal Medicine | Admitting: Internal Medicine

## 2023-04-16 ENCOUNTER — Encounter: Payer: Self-pay | Admitting: Internal Medicine

## 2023-04-16 VITALS — BP 130/80 | HR 70 | Ht 65.0 in | Wt 165.0 lb

## 2023-04-16 DIAGNOSIS — I447 Left bundle-branch block, unspecified: Secondary | ICD-10-CM

## 2023-04-16 DIAGNOSIS — I493 Ventricular premature depolarization: Secondary | ICD-10-CM | POA: Diagnosis not present

## 2023-04-16 DIAGNOSIS — I739 Peripheral vascular disease, unspecified: Secondary | ICD-10-CM

## 2023-04-16 DIAGNOSIS — E785 Hyperlipidemia, unspecified: Secondary | ICD-10-CM | POA: Diagnosis not present

## 2023-04-16 DIAGNOSIS — E1169 Type 2 diabetes mellitus with other specified complication: Secondary | ICD-10-CM

## 2023-04-16 DIAGNOSIS — I10 Essential (primary) hypertension: Secondary | ICD-10-CM

## 2023-04-16 DIAGNOSIS — I340 Nonrheumatic mitral (valve) insufficiency: Secondary | ICD-10-CM

## 2023-04-16 NOTE — Patient Instructions (Addendum)
Medication Instructions:  Your physician recommends that you continue on your current medications as directed. Please refer to the Current Medication list given to you today.  *If you need a refill on your cardiac medications before your next appointment, please call your pharmacy*   Lab Work: NONE If you have labs (blood work) drawn today and your tests are completely normal, you will receive your results only by: MyChart Message (if you have MyChart) OR A paper copy in the mail If you have any lab test that is abnormal or we need to change your treatment, we will call you to review the results.   Testing/Procedures: NOV 2025- - - Your physician has requested that you have an echocardiogram. Echocardiography is a painless test that uses sound waves to create images of your heart. It provides your doctor with information about the size and shape of your heart and how well your heart's chambers and valves are working. This procedure takes approximately one hour. There are no restrictions for this procedure. Please do NOT wear cologne, perfume, aftershave, or lotions (deodorant is allowed). Please arrive 15 minutes prior to your appointment time.  Please note: We ask at that you not bring children with you during ultrasound (echo/ vascular) testing. Due to room size and safety concerns, children are not allowed in the ultrasound rooms during exams. Our front office staff cannot provide observation of children in our lobby area while testing is being conducted. An adult accompanying a patient to their appointment will only be allowed in the ultrasound room at the discretion of the ultrasound technician under special circumstances. We apologize for any inconvenience.    Follow-Up: At [facility], you and your health needs are our priority.  As part of our continuing mission to provide you with exceptional heart care, we have created designated Provider Care Teams.  These Care Teams  include your primary Cardiologist (physician) and Advanced Practice Providers (APPs -  Physician Assistants and Nurse Practitioners) who all work together to provide you with the care you need, when you need it.   Your next appointment:   1 year(s)  Provider:   Riley Lam, MD

## 2023-04-16 NOTE — Progress Notes (Signed)
Cardiology Office Note:    Date:  04/16/2023   ID:  [name] Ramos, DOB [birth year], MRN [id number]  PCP:  Zola Button, Grayling Congress, DO   [facility] Providers Cardiologist:  Christell Constant, MD     Referring MD: Zola Button, Grayling Congress, *   CC:  PVC f/u   History of Present Illness:    [name] Ramos is a 57 y.o. female with a hx of LBBB, Fhx of CAD, PVCs well known to Dr. Katrinka Blazing.  2023: saw me for DOD- transitioned to be due to Dr. Katrinka Blazing retiring.    Discussed the use of AI scribe software for clinical note transcription with the patient, who gave verbal consent to proceed.  [name] Ramos, a patient with a history of premature ventricular contractions and an incomplete left bundle branch block, presents for a follow-up consultation. Prior to her recent surgery, her hypertension was well-controlled and she was asymptomatic from a peripheral arterial disease standpoint. She had a mild MR was recommended to repeat an echocardiogram in 2025.  The patient reports a history of unusual sensation of an electrical charge in her groin, which she has noticed to coincide with the onset and cessation of her bundle blockage. She denies any chest pain, breathing issues, or fainting spells. She has been on Ozempic for two years and has lost seventy pounds. She is currently working at the post [employer] and is considering returning to the gym for strength training.  She is on a beta blocker for her premature atrial contractions and ventricular contractions. She also has mild mitral regurgitation. Her blood pressure and cholesterol levels are well-controlled.   Past Medical History:  Diagnosis Date   Alcohol abuse    recovering alcoholic   Allergic urticaria    Anxiety    Cholecystitis, acute    Pt denies   Congenital anomaly of neck    Depression    Diabetes mellitus without complication (HCC)    Dyslipidemia    Followed by PCP   Gastroparesis    GERD (gastroesophageal reflux  disease)    Hematuria    Hiatal hernia    with reflux   Hypertension    stress test 11/2010 normal   Kidney stone    Left bundle branch block (LBBB)    Obesity    PVC's (premature ventricular contractions)    Controlled on beta blocker therapy;  Echo 9/10: EF 55-60%, grade 1 diastolic dysfunction, mild MR, mild LAE   Thyroid nodule    hx   Tinea corporis    Unspecified contraceptive management    Urinary tract infection    Vaginal Pap smear, abnormal     Past Surgical History:  Procedure Laterality Date   ANTERIOR CERVICAL DECOMP/DISCECTOMY FUSION N/A 06/04/2022   Procedure: ANTERIOR CERVICAL DECOMPRESSION/DISCECTOMY FUSION 3 LEVELS C4-7;  Surgeon: Venita Lick, MD;  Location: [facility] OR;  Service: Orthopedics;  Laterality: N/A;  3.5 hrs 3 C-Bed   leep surgery     mid 20's   TONSILLECTOMY      Current Medications: Current Meds  Medication Sig   ALPRAZolam (XANAX) 0.25 MG tablet TAKE 1 TABLET BY MOUTH 3 TIMES DAILY AS NEEDED FOR ANXIETY.   amLODipine (NORVASC) 5 MG tablet TAKE 1 TABLET (5 MG TOTAL) BY MOUTH DAILY.   aspirin EC 81 MG tablet Take 1 tablet by mouth daily.   atenolol (TENORMIN) 100 MG tablet Take 1 tablet (100 mg total) by mouth daily.   atorvastatin (LIPITOR) 10 MG tablet Take 1  tablet (10 mg total) by mouth daily.   blood glucose meter kit and supplies Dispense based on patient and insurance preference. Use up to four times daily as directed. (FOR ICD-10 E10.9, E11.9).   CELEBREX 200 MG capsule as needed for moderate pain (pain score 4-6).   clobetasol ointment (TEMOVATE) 0.05 % Apply to affected area every night for 4 weeks, then every other day for 4 weeks and then twice a week   EPINEPHrine 0.3 mg/0.3 mL IJ SOAJ injection Inject 0.3 mg into the muscle as needed for anaphylaxis.   hydrochlorothiazide (HYDRODIURIL) 25 MG tablet Take 1 tablet (25 mg total) by mouth daily.   medroxyPROGESTERone Acetate 150 MG/ML SUSY Inject 1 mL (150 mg total) as directed every 3  (three) months for 1 day.   metFORMIN (GLUCOPHAGE) 1000 MG tablet Take 1 tablet (1,000 mg total) by mouth 2 (two) times daily with a meal.   methocarbamol (ROBAXIN) 500 MG tablet as needed for muscle spasms.   omeprazole (PRILOSEC) 40 MG capsule TAKE 1 CAPSULE BY MOUTH EVERY DAY   Semaglutide, 2 MG/DOSE, (OZEMPIC, 2 MG/DOSE,) 8 MG/3ML SOPN Inject 2 mg as directed once a week.     Allergies:   Ace inhibitors, Bee venom, Lisinopril, and Piroxicam   Social History   Socioeconomic History   Marital status: Single    Spouse name: Not on file   Number of children: Not on file   Years of education: Not on file   Highest education level: Not on file  Occupational History   Occupation: post office    Employer: [employer]  Tobacco Use   Smoking status: Former    Current packs/day: 0.00    Types: Cigarettes    Quit date: 06/16/2018    Years since quitting: 4.8   Smokeless tobacco: Never  Vaping Use   Vaping status: Never Used  Substance and Sexual Activity   Alcohol use: Yes    Comment: social drinker, ocassional   Drug use: No   Sexual activity: Not Currently    Partners: Male  Other Topics Concern   Not on file  Social History Narrative   Exercising--elliptical    Social Determinants of Health   Financial Resource Strain: Not on file  Food Insecurity: Not on file  Transportation Needs: Not on file  Physical Activity: Not on file  Stress: Not on file  Social Connections: Not on file    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 There is no history of Colon cancer or Stomach cancer.  ROS:   Please see the history of present illness.      EKGs/Labs/Other Studies Reviewed:    The following studies were reviewed today:  Cardiac Studies & Procedures     STRESS TESTS  MYOCARDIAL PERFUSION IMAGING 09/05/2015  Narrative  Nuclear stress EF: 45%.  The left ventricular ejection fraction is mildly decreased (45-54%).  This is an intermediate risk study.   ECHOCARDIOGRAM  ECHOCARDIOGRAM COMPLETE 12/16/2020  Narrative ECHOCARDIOGRAM  REPORT    Patient Name:   LAQUANNA KASPRZYK Date of Exam: 12/16/2020 Medical Rec #:  [id number]       Height:       65.0 in Accession #:    [id number]      Weight:       217.4 lb Date of Birth:  21-Oct-1965       BSA:          2.050 m Patient Age:    55 years        BP:           122/70 mmHg Patient Gender: F               HR:           62 bpm. Exam Location:  [address]  Procedure: 2D Echo, 3D Echo, Cardiac Doppler, Color Doppler and Strain Analysis  Indications:    I44.7 LBBB  History:        Patient has prior history of Echocardiogram examinations, most recent 09/06/2014. Arrythmias:PVC and LBBB; Risk Factors:Hypertension, Diabetes, Dyslipidemia, Former Smoker and Obesity. EtOH.  Sonographer:    Garald Braver, RDCS Referring Phys: [phone] [facility]  IMPRESSIONS   1. Left ventricular ejection fraction, by estimation, is 55 to 60%. Left ventricular ejection fraction by 3D volume is 56 %. The left ventricle has normal function. The left ventricle has no regional wall motion abnormalities. Left ventricular diastolic parameters are consistent with Grade I diastolic dysfunction (impaired relaxation). The average left ventricular global longitudinal strain is -24.9 %. The global longitudinal strain is normal. 2. Right ventricular systolic function is normal.  The right ventricular size is normal. There is normal pulmonary artery systolic pressure. 3. The mitral valve is grossly normal. Mild mitral valve regurgitation. No evidence of mitral stenosis. 4. The aortic valve is tricuspid. Aortic valve regurgitation is not visualized. No aortic stenosis is present.  Comparison(s): Prior images unable to be directly viewed, comparison made by report only. Stress echo 09/06/14 EF 65% per report.  FINDINGS Left Ventricle: Left ventricular ejection fraction, by estimation, is 55 to 60%. Left ventricular ejection fraction by 3D volume is 56 %. The left ventricle has normal function. The left ventricle has no regional wall motion abnormalities. The average left ventricular global longitudinal strain is -24.9 %. The global longitudinal strain is normal. The left ventricular internal cavity size was normal in size. There is no left ventricular hypertrophy. Left ventricular diastolic parameters are consistent with Grade I diastolic dysfunction (impaired relaxation).  Right Ventricle: The right ventricular size is normal. No increase in right ventricular wall thickness. Right ventricular systolic function is normal. There is normal pulmonary artery systolic pressure. The tricuspid regurgitant velocity is 2.73 m/s, and with an assumed right atrial pressure of 3 mmHg, the estimated right ventricular systolic pressure is 32.8 mmHg.  Left Atrium: Left atrial size was normal in size.  Right Atrium: Right atrial size was normal in size.  Pericardium: There is no evidence of pericardial effusion.  Mitral Valve: The mitral valve is grossly normal. Mild mitral valve regurgitation. No evidence of mitral valve stenosis.  Tricuspid Valve: The tricuspid valve is grossly normal. Tricuspid valve regurgitation is mild . No evidence of tricuspid stenosis.  Aortic Valve: The aortic valve is tricuspid. Aortic valve regurgitation is not visualized. No aortic stenosis is  present.  Pulmonic Valve: The pulmonic valve was grossly normal. Pulmonic valve regurgitation is not visualized. No evidence of pulmonic stenosis.  Aorta: The aortic  root and ascending aorta are structurally normal, with no evidence of dilitation.  IAS/Shunts: The atrial septum is grossly normal.   LEFT VENTRICLE PLAX 2D LVIDd:         4.90 cm         Diastology LVIDs:         3.50 cm         LV e' medial:    7.22 cm/s LV PW:         0.90 cm         LV E/e' medial:  9.9 LV IVS:        1.00 cm         LV e' lateral:   6.32 cm/s LVOT diam:     2.00 cm         LV E/e' lateral: 11.3 LV SV:         95 LV SV Index:   46              2D LVOT Area:     3.14 cm        Longitudinal Strain 2D Strain GLS  -28.3 % (A2C): 2D Strain GLS  -20.1 % (A3C): 2D Strain GLS  -26.4 % (A4C): 2D Strain GLS  -24.9 % Avg:  3D Volume EF LV 3D EF:    Left ventricular ejection fraction by 3D volume is 56 %.  3D Volume EF: 3D EF:        56 % LV EDV:       172 ml LV ESV:       76 ml LV SV:        96 ml  RIGHT VENTRICLE RV Basal diam:  3.10 cm RV S prime:     11.90 cm/s TAPSE (M-mode): 2.7 cm RVSP:           32.8 mmHg  LEFT ATRIUM             Index       RIGHT ATRIUM           Index LA diam:        3.90 cm 1.90 cm/m  RA Pressure: 3.00 mmHg LA Vol (A2C):   35.1 ml 17.13 ml/m RA Area:     9.16 cm LA Vol (A4C):   43.1 ml 21.03 ml/m RA Volume:   16.10 ml  7.86 ml/m LA Biplane Vol: 40.0 ml 19.52 ml/m AORTIC VALVE LVOT Vmax:   113.00 cm/s LVOT Vmean:  76.800 cm/s LVOT VTI:    0.302 m  AORTA Ao Root diam: 2.80 cm Ao Asc diam:  3.60 cm  MITRAL VALVE               TRICUSPID VALVE TR Peak grad:   29.8 mmHg TR Vmax:        273.00 cm/s MV E velocity: 71.60 cm/s  Estimated RAP:  3.00 mmHg MV A velocity: 89.50 cm/s  RVSP:           32.8 mmHg MV E/A ratio:  0.80 SHUNTS Systemic VTI:  0.30 m Systemic Diam: 2.00 cm  Riley Lam MD Electronically signed by Riley Lam  MD Signature Date/Time: 12/16/2020/10:37:30 AM    Final                 Recent Labs: 02/09/2023: ALT 22; BUN 11; Creatinine, Ser 0.63; Hemoglobin 13.4; Platelets 300.0; Potassium 3.8; Sodium 140  Recent Lipid Panel    Component Value Date/Time   CHOL 134 02/09/2023 1005  TRIG 71.0 02/09/2023 1005   HDL 54.30 02/09/2023 1005   CHOLHDL 2 02/09/2023 1005   VLDL 14.2 02/09/2023 1005   LDLCALC 65 02/09/2023 1005   LDLDIRECT 148.1 01/20/2008 0912    Physical Exam:    VS:  BP 130/80   Pulse 70   Ht 5\' 5"  (1.651 m)   Wt 165 lb (74.8 kg)   SpO2 99%   BMI 27.46 kg/m     Wt Readings from Last 3 Encounters:  04/16/23 165 lb (74.8 kg)  02/09/23 163 lb 3.2 oz (74 kg)  08/24/22 178 lb (80.7 kg)    Gen: no distress  Neck: No JVD Cardiac: No Rubs or Gallops, soft systolic murmur, RRR +2 radial pulses Respiratory: Clear to auscultation bilaterally, normal effort, normal  respiratory rate GI: Soft, nontender, non-distended  MS: No  edema;  moves all extremities Integument: Skin feels well Neuro:  At time of evaluation, alert and oriented to person/place/time/situation  Psych: Normal affect, patient feels well  ASSESSMENT:    1. Hyperlipidemia associated with type 2 diabetes mellitus (HCC)   2. Left bundle branch block   3. PVC (premature ventricular contraction)   4. Primary hypertension   5. Nonrheumatic mitral valve regurgitation     PLAN:    Incomplete Left Bundle Branch Block Currently has an incomplete left bundle branch block with a slightly narrower QRS complex than before. Condition has improved from a complete left bundle branch block and is not expected to significantly impact life. - Monitor condition, no immediate intervention required  Premature Atrial Contractions (PACs) Premature Ventricular Contractions (PVCs) - One isolated PAC noted during the visit. Currently on atenolol 100 mg, No PVCs observed. - Continue atenolol 100 mg - Monitor for symptoms,  especially during physical activity   Mild Mitral Regurgitation - Mild mitral regurgitation. Repeat echocardiogram planned for 2025 to monitor condition.  Peripheral Arterial Disease per history Asymptomatic from a peripheral arterial disease standpoint., LDL at goal  Hypertension Hypertension is well controlled.  General Health Maintenance - On Ozempic and has lost 70 pounds. Discussed importance of maintaining muscle mass through adequate protein intake and strength training to counteract potential sarcopenia associated with semaglutide use. - Encourage strength training and adequate protein intake  Follow-up - Schedule follow-up visit in one year       Medication Adjustments/Labs and Tests Ordered: Current medicines are reviewed at length with the patient today.  Concerns regarding medicines are outlined above.  Orders Placed This Encounter  Procedures   EKG 12-Lead   ECHOCARDIOGRAM COMPLETE   No orders of the defined types were placed in this encounter.   Patient Instructions  Medication Instructions:  Your physician recommends that you continue on your current medications as directed. Please refer to the Current Medication list given to you today.  *If you need a refill on your cardiac medications before your next appointment, please call your pharmacy*   Lab Work: NONE If you have labs (blood work) drawn today and your tests are completely normal, you will receive your results only by: MyChart Message (if you have MyChart) OR A paper copy in the mail If you have any lab test that is abnormal or we need to change your treatment, we will call you to review the results.   Testing/Procedures: NOV 2025- - - Your physician has requested that you have an echocardiogram. Echocardiography is a painless test that uses sound waves to create images of your heart. It provides your doctor with information about the  size and shape of your heart and how well your heart's  chambers and valves are working. This procedure takes approximately one hour. There are no restrictions for this procedure. Please do NOT wear cologne, perfume, aftershave, or lotions (deodorant is allowed). Please arrive 15 minutes prior to your appointment time.  Please note: We ask at that you not bring children with you during ultrasound (echo/ vascular) testing. Due to room size and safety concerns, children are not allowed in the ultrasound rooms during exams. Our front office staff cannot provide observation of children in our lobby area while testing is being conducted. An adult accompanying a patient to their appointment will only be allowed in the ultrasound room at the discretion of the ultrasound technician under special circumstances. We apologize for any inconvenience.    Follow-Up: At [facility], you and your health needs are our priority.  As part of our continuing mission to provide you with exceptional heart care, we have created designated Provider Care Teams.  These Care Teams include your primary Cardiologist (physician) and Advanced Practice Providers (APPs -  Physician Assistants and Nurse Practitioners) who all work together to provide you with the care you need, when you need it.   Your next appointment:   1 year(s)  Provider:   Riley Lam, MD       Signed, Christell Constant, MD  04/16/2023 9:39 AM    [facility]

## 2023-04-22 ENCOUNTER — Other Ambulatory Visit: Payer: Self-pay | Admitting: Specialist

## 2023-04-22 DIAGNOSIS — M898X1 Other specified disorders of bone, shoulder: Secondary | ICD-10-CM

## 2023-05-07 ENCOUNTER — Other Ambulatory Visit: Payer: Self-pay | Admitting: Specialist

## 2023-05-07 DIAGNOSIS — M898X1 Other specified disorders of bone, shoulder: Secondary | ICD-10-CM

## 2023-05-14 ENCOUNTER — Ambulatory Visit
Admission: RE | Admit: 2023-05-14 | Discharge: 2023-05-14 | Disposition: A | Discharge status: Home / Self Care | Payer: Federal, State, Local not specified - PPO | Source: Ambulatory Visit | Attending: Specialist | Admitting: Specialist

## 2023-05-14 DIAGNOSIS — M898X1 Other specified disorders of bone, shoulder: Secondary | ICD-10-CM

## 2023-05-14 DIAGNOSIS — M25511 Pain in right shoulder: Secondary | ICD-10-CM | POA: Diagnosis not present

## 2023-05-14 DIAGNOSIS — G8929 Other chronic pain: Secondary | ICD-10-CM | POA: Diagnosis not present

## 2023-05-14 DIAGNOSIS — M7989 Other specified soft tissue disorders: Secondary | ICD-10-CM | POA: Diagnosis not present

## 2023-05-14 MED ORDER — METHYLPREDNISOLONE ACETATE 40 MG/ML INJ SUSP (RADIOLOG: 40.0000 mg | Freq: Once | INTRAMUSCULAR | Status: DC

## 2023-05-18 ENCOUNTER — Ambulatory Visit: Payer: Federal, State, Local not specified - PPO | Admitting: Family Medicine

## 2023-05-18 ENCOUNTER — Encounter: Payer: Self-pay | Admitting: Family Medicine

## 2023-05-18 VITALS — BP 152/70 | HR 66 | Temp 97.8°F | Resp 18 | Ht 65.0 in | Wt 165.2 lb

## 2023-05-18 DIAGNOSIS — R112 Nausea with vomiting, unspecified: Secondary | ICD-10-CM | POA: Diagnosis not present

## 2023-05-18 DIAGNOSIS — R1013 Epigastric pain: Secondary | ICD-10-CM

## 2023-05-18 DIAGNOSIS — R1011 Right upper quadrant pain: Secondary | ICD-10-CM

## 2023-05-18 DIAGNOSIS — K219 Gastro-esophageal reflux disease without esophagitis: Secondary | ICD-10-CM | POA: Diagnosis not present

## 2023-05-18 MED ORDER — OMEPRAZOLE 40 MG PO CPDR: DELAYED_RELEASE_CAPSULE | ORAL | 3 refills | Status: DC

## 2023-05-18 MED ORDER — ONDANSETRON HCL 4 MG PO TABS: 4.0000 mg | ORAL_TABLET | Freq: Three times a day (TID) | ORAL | 0 refills | Status: DC | PRN

## 2023-05-18 NOTE — Patient Instructions (Signed)
Nausea, Adult Nausea is the feeling of having an upset stomach or that you are about to vomit. Nausea on its own is not usually a serious concern, but it may be an early sign of a more serious medical problem. As nausea gets worse, it can lead to vomiting. If vomiting develops, or if you are not able to drink enough fluids, you are at risk of becoming dehydrated. Dehydration can make you tired and thirsty, cause you to have a dry mouth, and decrease how often you urinate. Older adults and people with other diseases or a weak disease-fighting system (immune system) are at higher risk for dehydration. The main goals of treating your nausea are: To relieve your nausea. To limit repeated nausea episodes. To prevent vomiting and dehydration. Follow these instructions at home: Watch your symptoms for any changes. Tell your health care provider about them. Eating and drinking     Take an oral rehydration solution (ORS). This is a drink that is sold at pharmacies and retail stores. Drink clear fluids slowly and in small amounts as you are able. Clear fluids include water, ice chips, low-calorie sports drinks, and fruit juice that has water added (diluted fruit juice). Eat bland, easy-to-digest foods in small amounts as you are able. These foods include bananas, applesauce, rice, lean meats, toast, and crackers. Avoid drinking fluids that contain a lot of sugar or caffeine, such as energy drinks, sports drinks, and soda. Avoid alcohol. Avoid spicy or fatty foods. General instructions Take over-the-counter and prescription medicines only as told by your health care provider. Rest at home while you recover. Drink enough fluid to keep your urine pale yellow. Breathe slowly and deeply when you feel nauseous. Avoid smelling things that have strong odors. Wash your hands often using soap and water for at least 20 seconds. If soap and water are not available, use hand sanitizer. Make sure that everyone in  your household washes their hands well and often. Keep all follow-up visits. This is important. Contact a health care provider if: Your nausea gets worse. Your nausea does not go away after two days. You vomit multiple times. You cannot drink fluids without vomiting. You have any of the following: New symptoms. A fever. A headache. Muscle cramps. A rash. Pain while urinating. You feel light-headed or dizzy. Get help right away if: You have pain in your chest, neck, arm, or jaw. You feel extremely weak or you faint. You have vomit that is bright red or looks like coffee grounds. You have bloody or black stools (feces) or stools that look like tar. You have a severe headache, a stiff neck, or both. You have severe pain, cramping, or bloating in your abdomen. You have difficulty breathing or are breathing very quickly. Your heart is beating very quickly. Your skin feels cold and clammy. You feel confused. You have signs of dehydration, such as: Dark urine, very little urine, or no urine. Cracked lips. Dry mouth. Sunken eyes. Sleepiness. Weakness. These symptoms may be an emergency. Get help right away. Call 911. Do not wait to see if the symptoms will go away. Do not drive yourself to the hospital. Summary Nausea is the feeling that you have an upset stomach or that you are about to vomit. Nausea on its own is not usually a serious concern, but it may be an early sign of a more serious medical problem. If vomiting develops, or if you are not able to drink enough fluids, you are at risk of becoming   dehydrated. Follow recommendations for eating and drinking and take over-the-counter and prescription medicines only as told by your health care provider. Contact a health care provider right away if your symptoms worsen or you have new symptoms. Keep all follow-up visits. This is important. This information is not intended to replace advice given to you by your health care provider.  Make sure you discuss any questions you have with your health care provider. Document Revised: 11/22/2020 Document Reviewed: 11/22/2020 Elsevier Patient Education  2024 [employer].

## 2023-05-18 NOTE — Progress Notes (Signed)
Established Patient Office Visit  Subjective   Patient ID: [name] Ramos, female    DOB: 06-29-1965  Age: 57 y.o. MRN: [id number]  Chief Complaint  Patient presents with   Abdominal Pain    Onset: 1 week     HPI Discussed the use of AI scribe software for clinical note transcription with the patient, who gave verbal consent to proceed.  History of Present Illness   The patient presented with an acute onset of gastrointestinal symptoms that began last Wednesday. She reported waking up at midnight with an upset stomach and gas, which progressed to vomiting by 3 AM. The patient also experienced a fever that peaked at 102.69F on the same day. Since then, she has been experiencing persistent stomach discomfort and has been unable to eat due to nausea and a sensation of acid reflux. The patient reported that any food intake exacerbates the nausea, with the exception of cereal and milk, which seemed to soothe her stomach.  The patient also noted a rapid weight gain of three pounds over a couple of days prior to the onset of symptoms, followed by a weight loss of six pounds since the onset of the illness. She reported abdominal pain and questioned the possibility of a blockage, but also noted regular bowel movements. The patient has been managing the symptoms with Alka-Seltzer soft chews, which provide some relief but have not resolved the issue.  The patient has been on Ozempic, which she noted seemed to increase her appetite prior to the onset of the current symptoms. She also reported a large lump in her neck, which has been evaluated via CT scan and determined to be swollen soft tissue, not cancer or arthritis. The patient has been taking omeprazole daily for acid reflux, but it does not seem to be effective in managing the current symptoms.      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   Hyperglycemia 12/26/2020   Depression, major, single episode, mild (HCC) 12/26/2020   PAD (peripheral artery disease) (HCC) 12/26/2020   Hyperlipidemia associated with type 2 diabetes mellitus (HCC) 07/19/2020   Diet-controlled diabetes mellitus (HCC) 07/19/2020   Ruptured disc, cervical 06/29/2019   Preventative health care 06/29/2019   Mid back pain 02/20/2019   Psoriasis 02/04/2017   Left bundle branch block 09/02/2015   Ingrown nail 12/04/2014   Pain in toe of left foot 12/04/2014   Tremor 04/15/2012   Nausea and vomiting 04/01/2012   Depression with anxiety 01/25/2012   PVC (premature ventricular contraction) 09/09/2011   Hordeolum externum of left upper eyelid 12/15/2010   Hair loss 10/16/2010   Lymph node enlargement 10/16/2010   Eczema 10/16/2010   Vaginitis and vulvovaginitis 04/22/2010   HEMATURIA, HX OF 04/22/2010   Hyperlipidemia 04/10/2010   Viral warts 03/21/2010   Incontinence of feces 02/21/2009   HIATAL HERNIA WITH REFLUX, HX OF 02/21/2009   CHOLECYSTITIS - ACUTE 08/07/2008   ALLERGIC URTICARIA 03/16/2008   Dermatophytosis of body 01/20/2008   GERD 09/02/2007   Essential hypertension 01/21/2007   Hematuria 01/21/2007   ANOMALY, 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   THYROID NODULE, HX OF 01/21/2007  Past Medical History:  Diagnosis Date   Alcohol abuse    recovering alcoholic   Allergic urticaria    Anxiety    Cholecystitis, acute    Pt denies   Congenital anomaly of neck    Depression    Diabetes mellitus without complication (HCC)    Dyslipidemia    Followed by PCP   Gastroparesis    GERD (gastroesophageal reflux disease)    Hematuria    Hiatal hernia    with reflux    Hypertension    stress test 11/2010 normal   Kidney stone    Left bundle branch block (LBBB)    Obesity    PVC's (premature ventricular contractions)    Controlled on beta blocker therapy;  Echo 9/10: EF 55-60%, grade 1 diastolic dysfunction, mild MR, mild LAE   Thyroid nodule    hx   Tinea corporis    Unspecified contraceptive management    Urinary tract infection    Vaginal Pap smear, abnormal    Past Surgical History:  Procedure Laterality Date   ANTERIOR CERVICAL DECOMP/DISCECTOMY FUSION N/A 06/04/2022   Procedure: ANTERIOR CERVICAL DECOMPRESSION/DISCECTOMY FUSION 3 LEVELS C4-7;  Surgeon: Venita Lick, MD;  Location: [facility] OR;  Service: Orthopedics;  Laterality: N/A;  3.5 hrs 3 C-Bed   leep surgery     mid 20's   TONSILLECTOMY     Social History   Tobacco Use   Smoking status: Former    Current packs/day: 0.00    Types: Cigarettes    Quit date: 06/16/2018    Years since quitting: 4.9   Smokeless tobacco: Never  Vaping Use   Vaping status: Never Used  Substance Use Topics   Alcohol use: Yes    Comment: social drinker, ocassional   Drug use: No   Social History   Socioeconomic History   Marital status: Single    Spouse name: Not on file   Number of children: Not on file   Years of education: Not on file   Highest education level: Not on file  Occupational History   Occupation: post office    Employer: [employer]  Tobacco Use   Smoking status: Former    Current packs/day: 0.00    Types: Cigarettes    Quit date: 06/16/2018    Years since quitting: 4.9   Smokeless tobacco: Never  Vaping Use   Vaping status: Never Used  Substance and Sexual Activity   Alcohol use: Yes    Comment: social drinker, ocassional   Drug use: No   Sexual activity: Not Currently    Partners: Male  Other Topics Concern   Not on file  Social History Narrative   Exercising--elliptical    Social Drivers of Health   Financial Resource Strain: Low Risk  (05/18/2023)   Overall  Financial Resource Strain (CARDIA)    Difficulty of Paying Living Expenses: Not hard at all  Food Insecurity: No Food Insecurity (05/18/2023)   Hunger Vital Sign    Worried About Running Out of Food in the Last Year: Never true    Ran Out of Food in the Last Year: Never true  Transportation Needs: Not on file  Physical Activity: Sufficiently Active (05/18/2023)   Exercise Vital Sign    Days of Exercise per Week: 5 days    Minutes of Exercise per Session: 120 min  Stress: Stress Concern Present (05/18/2023)   [employer] of Occupational Health - Occupational Stress Questionnaire    Feeling of Stress : To some  extent  Social Connections: Socially Isolated (05/18/2023)   Social Connection and Isolation Panel [NHANES]    Frequency of Communication with Friends and Family: Once a week    Frequency of Social Gatherings with Friends and Family: Once a week    Attends Religious Services: Never    Database administrator or Organizations: No    Attends Engineer, structural: Not on file    Marital Status: Never married  Catering manager Violence: Not on file   Family Status  Relation Name Status   Mother  Alive   Father  Alive   Brother  Alive   Mat Uncle  (Not Specified)   MGM  (Not Specified)   PGM  (Not Specified)   Other  (Not Specified)   Neg Hx  (Not Specified)  No partnership data on file   Family History  Problem Relation Age of Onset   Diabetes Mother    Liver disease Mother    Irritable bowel syndrome Mother    Colon polyps Mother    Thyroid disease Mother    Hypertension Mother    Breast cancer Mother 51   Hyperlipidemia Father    Hypertension Father    Lung cancer Maternal Uncle    Lung cancer Maternal Grandmother    Heart attack Maternal Grandmother 48   Diabetes Maternal Grandmother    Lung cancer Paternal Grandmother    Coronary artery disease Other        female 1st degree relative 55 yo   Hypertension Other    Colon cancer Neg Hx    Stomach  cancer Neg Hx    Allergies  Allergen Reactions   Ace Inhibitors Other (See Comments)    Numbness, tingling of lips.   Bee Venom Swelling    Leg swelling   Lisinopril     Other reaction(s): Other (See Comments) other   Piroxicam Hives      Review of Systems  Constitutional:  Negative for fever and malaise/fatigue.  HENT:  Negative for congestion.   Eyes:  Negative for blurred vision.  Respiratory:  Negative for cough and shortness of breath.   Cardiovascular:  Negative for chest pain, palpitations and leg swelling.  Gastrointestinal:  Positive for nausea and vomiting. Negative for abdominal pain and blood in stool.  Genitourinary:  Negative for dysuria and frequency.  Musculoskeletal:  Negative for back pain and falls.  Skin:  Negative for rash.  Neurological:  Negative for dizziness, loss of consciousness and headaches.  Endo/Heme/Allergies:  Negative for environmental allergies.  Psychiatric/Behavioral:  Negative for depression. The patient is not nervous/anxious.       Objective:     BP (!) 152/70   Pulse 66   Temp 97.8 F (36.6 C)   Resp 18   Ht 5\' 5"  (1.651 m)   Wt 165 lb 3.2 oz (74.9 kg)   SpO2 100%   BMI 27.49 kg/m  BP Readings from Last 3 Encounters:  05/18/23 (!) 152/70  04/16/23 130/80  02/09/23 120/80   Wt Readings from Last 3 Encounters:  05/18/23 165 lb 3.2 oz (74.9 kg)  04/16/23 165 lb (74.8 kg)  02/09/23 163 lb 3.2 oz (74 kg)   SpO2 Readings from Last 3 Encounters:  05/18/23 100%  04/16/23 99%  02/09/23 98%      Physical Exam Vitals and nursing note reviewed.  Constitutional:      General: She is not in acute distress.    Appearance: Normal appearance. She is well-developed.  HENT:     Head: Normocephalic and atraumatic.  Eyes:     General: No scleral icterus.       Right eye: No discharge.        Left eye: No discharge.  Cardiovascular:     Rate and Rhythm: Normal rate and regular rhythm.     Heart sounds: No murmur  heard. Pulmonary:     Effort: Pulmonary effort is normal. No respiratory distress.     Breath sounds: Normal breath sounds.  Abdominal:     Tenderness: There is abdominal tenderness in the right upper quadrant. There is no guarding or rebound.  Musculoskeletal:        General: Normal range of motion.     Cervical back: Normal range of motion and neck supple.     Right lower leg: No edema.     Left lower leg: No edema.  Skin:    General: Skin is warm and dry.  Neurological:     Mental Status: She is alert and oriented to person, place, and time.  Psychiatric:        Mood and Affect: Mood normal.        Behavior: Behavior normal.        Thought Content: Thought content normal.        Judgment: Judgment normal.      No results found for any visits on 05/18/23.  Last CBC Lab Results  Component Value Date   WBC 6.4 02/09/2023   HGB 13.4 02/09/2023   HCT 40.3 02/09/2023   MCV 95.1 02/09/2023   MCH 31.5 05/27/2022   RDW 12.3 02/09/2023   PLT 300.0 02/09/2023   Last metabolic panel Lab Results  Component Value Date   GLUCOSE 83 02/09/2023   NA 140 02/09/2023   K 3.8 02/09/2023   CL 103 02/09/2023   CO2 28 02/09/2023   BUN 11 02/09/2023   CREATININE 0.63 02/09/2023   GFR 99.94 02/09/2023   CALCIUM 9.9 02/09/2023   PROT 6.7 02/09/2023   ALBUMIN 4.2 02/09/2023   BILITOT 0.8 02/09/2023   ALKPHOS 44 02/09/2023   AST 18 02/09/2023   ALT 22 02/09/2023   ANIONGAP 8 05/27/2022   Last lipids Lab Results  Component Value Date   CHOL 134 02/09/2023   HDL 54.30 02/09/2023   LDLCALC 65 02/09/2023   LDLDIRECT 148.1 01/20/2008   TRIG 71.0 02/09/2023   CHOLHDL 2 02/09/2023   Last hemoglobin A1c Lab Results  Component Value Date   HGBA1C 5.6 02/09/2023   Last thyroid functions Lab Results  Component Value Date   TSH 0.74 02/05/2022   Last vitamin D Lab Results  Component Value Date   VD25OH 46.12 08/07/2022   Last vitamin B12 and Folate Lab Results  Component  Value Date   VITAMINB12 731 12/28/2019   FOLATE >24.8 12/28/2019      The 10-year ASCVD risk score (Arnett DK, et al., 2019) is: 4.8%    Assessment & Plan:   Problem List Items Addressed This Visit       Unprioritized   Dyspepsia - Primary   Relevant Medications   ondansetron (ZOFRAN) 4 MG tablet   Other Relevant Orders   Comprehensive metabolic panel   CBC with Differential/Platelet   Nausea and vomiting   Brat diet  Check labs  Consider GI referral       Relevant Medications   ondansetron (ZOFRAN) 4 MG tablet   Other Relevant Orders   Comprehensive metabolic panel  CBC with Differential/Platelet   Other Visit Diagnoses       Gastroesophageal reflux disease       Relevant Medications   ondansetron (ZOFRAN) 4 MG tablet   omeprazole (PRILOSEC) 40 MG capsule     RUQ pain       Relevant Orders   US Abdomen Limited RUQ (LIVER/GB)     .Assessment and Plan    Gastroenteritis   She presented with acute gastrointestinal symptoms including nausea, vomiting, diarrhea, and abdominal pain since last Wednesday, with an initial fever of 102.69F. The differential diagnosis considered viral gastroenteritis, food poisoning, or a reaction to Ozempic. We discussed discontinuing Ozempic to assess symptom improvement and considering alternative diabetes management if symptoms resolve. Zofran was prescribed for nausea, and its potential side effects were discussed. She was encouraged to hydrate with water, juice, and Pedialyte to prevent dehydration. We will discontinue Ozempic, prescribe Zofran for nausea, order blood work, and schedule an abdominal ultrasound.  Gastroesophageal Reflux Disease (GERD)   She has a chronic condition of GERD managed with omeprazole, but is experiencing persistent nausea and abdominal pain despite daily use. We discussed increasing omeprazole to twice daily for better symptom control. The plan is to increase omeprazole to twice daily.  Thyroid Nodule   A  thyroid nodule was evaluated with a CT scan and determined to be non-cancerous. The plan is to continue monitoring the thyroid nodule for any changes.  Follow-up   She is to call the imaging center to schedule an abdominal ultrasound, visit the lab for blood work, and pick up prescribed medication from the pharmacy.      7  Return if symptoms worsen or fail to improve.    Donato Schultz, DO

## 2023-05-18 NOTE — Assessment & Plan Note (Signed)
Brat diet  Check labs  Consider GI referral

## 2023-05-19 DIAGNOSIS — M542 Cervicalgia: Secondary | ICD-10-CM | POA: Diagnosis not present

## 2023-05-19 LAB — CBC WITH DIFFERENTIAL/PLATELET
Basophils Absolute: 0.1 10*3/uL (ref 0.0–0.1)
Basophils Relative: 0.8 % (ref 0.0–3.0)
Eosinophils Absolute: 0.1 10*3/uL (ref 0.0–0.7)
Eosinophils Relative: 1 % (ref 0.0–5.0)
HCT: 42.6 % (ref 36.0–46.0)
Hemoglobin: 14.3 g/dL (ref 12.0–15.0)
Lymphocytes Relative: 33.3 % (ref 12.0–46.0)
Lymphs Abs: 2.6 10*3/uL (ref 0.7–4.0)
MCHC: 33.5 g/dL (ref 30.0–36.0)
MCV: 95.6 fL (ref 78.0–100.0)
Monocytes Absolute: 0.8 10*3/uL (ref 0.1–1.0)
Monocytes Relative: 10.2 % (ref 3.0–12.0)
Neutro Abs: 4.3 10*3/uL (ref 1.4–7.7)
Neutrophils Relative %: 54.7 % (ref 43.0–77.0)
Platelets: 324 10*3/uL (ref 150.0–400.0)
RBC: 4.46 Mil/uL (ref 3.87–5.11)
RDW: 13.3 % (ref 11.5–15.5)
WBC: 7.9 10*3/uL (ref 4.0–10.5)

## 2023-05-19 LAB — COMPREHENSIVE METABOLIC PANEL
ALT: 39 U/L — ABNORMAL HIGH (ref 0–35)
AST: 25 U/L (ref 0–37)
Albumin: 4.4 g/dL (ref 3.5–5.2)
Alkaline Phosphatase: 41 U/L (ref 39–117)
BUN: 13 mg/dL (ref 6–23)
CO2: 30 meq/L (ref 19–32)
Calcium: 10 mg/dL (ref 8.4–10.5)
Chloride: 98 meq/L (ref 96–112)
Creatinine, Ser: 0.75 mg/dL (ref 0.40–1.20)
GFR: 89.52 mL/min (ref >60.00)
Glucose, Bld: 90 mg/dL (ref 70–99)
Potassium: 4 meq/L (ref 3.5–5.1)
Sodium: 137 meq/L (ref 135–145)
Total Bilirubin: 0.4 mg/dL (ref 0.2–1.2)
Total Protein: 7.1 g/dL (ref 6.0–8.3)

## 2023-05-20 ENCOUNTER — Encounter: Payer: Self-pay | Admitting: Family Medicine

## 2023-05-20 ENCOUNTER — Other Ambulatory Visit: Payer: Federal, State, Local not specified - PPO

## 2023-05-20 DIAGNOSIS — R1011 Right upper quadrant pain: Secondary | ICD-10-CM

## 2023-05-21 DIAGNOSIS — M25511 Pain in right shoulder: Secondary | ICD-10-CM | POA: Diagnosis not present

## 2023-06-15 ENCOUNTER — Other Ambulatory Visit (HOSPITAL_BASED_OUTPATIENT_CLINIC_OR_DEPARTMENT_OTHER): Payer: Self-pay

## 2023-06-15 ENCOUNTER — Other Ambulatory Visit: Payer: Self-pay | Admitting: Family Medicine

## 2023-06-15 ENCOUNTER — Encounter: Payer: Self-pay | Admitting: Family Medicine

## 2023-06-15 DIAGNOSIS — E1165 Type 2 diabetes mellitus with hyperglycemia: Secondary | ICD-10-CM

## 2023-06-15 MED ORDER — SEMAGLUTIDE (2 MG/DOSE) 8 MG/3ML ~~LOC~~ SOPN: 2.0000 mg | PEN_INJECTOR | SUBCUTANEOUS | 3 refills | Status: DC

## 2023-06-15 MED ORDER — SEMAGLUTIDE (2 MG/DOSE) 8 MG/3ML ~~LOC~~ SOPN
2.0000 mg | PEN_INJECTOR | SUBCUTANEOUS | 3 refills | Status: DC
  Filled 2023-06-15 (×3): qty 3, 28d supply, fill #0
  Filled 2023-07-13: qty 3, 28d supply, fill #1
  Filled 2023-08-10: qty 3, 28d supply, fill #2

## 2023-06-26 DIAGNOSIS — E119 Type 2 diabetes mellitus without complications: Secondary | ICD-10-CM | POA: Diagnosis not present

## 2023-06-26 LAB — HM DIABETES EYE EXAM

## 2023-07-06 ENCOUNTER — Ambulatory Visit: Payer: Federal, State, Local not specified - PPO | Admitting: Family Medicine

## 2023-07-06 ENCOUNTER — Encounter: Payer: Self-pay | Admitting: Family Medicine

## 2023-07-06 VITALS — BP 120/86 | HR 72 | Temp 98.6°F | Resp 16 | Ht 65.0 in | Wt 169.2 lb

## 2023-07-06 DIAGNOSIS — M160 Bilateral primary osteoarthritis of hip: Secondary | ICD-10-CM

## 2023-07-06 DIAGNOSIS — M542 Cervicalgia: Secondary | ICD-10-CM

## 2023-07-06 DIAGNOSIS — M170 Bilateral primary osteoarthritis of knee: Secondary | ICD-10-CM

## 2023-07-06 DIAGNOSIS — M129 Arthropathy, unspecified: Secondary | ICD-10-CM

## 2023-07-06 LAB — COMPREHENSIVE METABOLIC PANEL
ALT: 24 U/L (ref 0–35)
AST: 18 U/L (ref 0–37)
Albumin: 4.6 g/dL (ref 3.5–5.2)
Alkaline Phosphatase: 48 U/L (ref 39–117)
BUN: 13 mg/dL (ref 6–23)
CO2: 30 meq/L (ref 19–32)
Calcium: 10.1 mg/dL (ref 8.4–10.5)
Chloride: 99 meq/L (ref 96–112)
Creatinine, Ser: 0.67 mg/dL (ref 0.40–1.20)
GFR: 98.19 mL/min (ref >60.00)
Glucose, Bld: 103 mg/dL — ABNORMAL HIGH (ref 70–99)
Potassium: 4.1 meq/L (ref 3.5–5.1)
Sodium: 138 meq/L (ref 135–145)
Total Bilirubin: 0.9 mg/dL (ref 0.2–1.2)
Total Protein: 7 g/dL (ref 6.0–8.3)

## 2023-07-06 LAB — CBC WITH DIFFERENTIAL/PLATELET
Basophils Absolute: 0 10*3/uL (ref 0.0–0.1)
Basophils Relative: 0.6 % (ref 0.0–3.0)
Eosinophils Absolute: 0.1 10*3/uL (ref 0.0–0.7)
Eosinophils Relative: 1.4 % (ref 0.0–5.0)
HCT: 42.5 % (ref 36.0–46.0)
Hemoglobin: 14.1 g/dL (ref 12.0–15.0)
Lymphocytes Relative: 23.5 % (ref 12.0–46.0)
Lymphs Abs: 1.4 10*3/uL (ref 0.7–4.0)
MCHC: 33.2 g/dL (ref 30.0–36.0)
MCV: 97.1 fL (ref 78.0–100.0)
Monocytes Absolute: 0.5 10*3/uL (ref 0.1–1.0)
Monocytes Relative: 8.5 % (ref 3.0–12.0)
Neutro Abs: 3.8 10*3/uL (ref 1.4–7.7)
Neutrophils Relative %: 66 % (ref 43.0–77.0)
Platelets: 282 10*3/uL (ref 150.0–400.0)
RBC: 4.37 Mil/uL (ref 3.87–5.11)
RDW: 13.4 % (ref 11.5–15.5)
WBC: 5.8 10*3/uL (ref 4.0–10.5)

## 2023-07-06 LAB — SEDIMENTATION RATE: Sed Rate: 3 mm/h (ref 0–30)

## 2023-07-06 LAB — HIGH SENSITIVITY CRP: CRP, High Sensitivity: 0.51 mg/L (ref 0.000–5.000)

## 2023-07-06 NOTE — Progress Notes (Signed)
 Established Patient Office Visit  Subjective   Patient ID: [name] Ramos, female    DOB: Oct 03, 1965  Age: 58 y.o. MRN: [id number]  Chief Complaint  Patient presents with   Arthritis    Neck and would like a referral to rheumatology     HPI Discussed the use of AI scribe software for clinical note transcription with the patient, who gave verbal consent to proceed.  History of Present Illness   [name] Ramos is a 58 year old female with arthritis who presents with swelling and pain in the neck and clavicle area.  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 After a period of relief when she stopped this activity, she resumed it last year, leading to a recurrence of symptoms. She underwent surgery to remove bone spurs from the back of her neck, but continues to experience significant swelling in the front of her neck. A recent CT scan revealed jagged edges on her clavicle, which are too close to a major artery to be surgically addressed.  She describes recent muscle spasms and shooting pain on the left side of her neck, particularly when yawning, despite the primary issues being on the right side. She has received injections that temporarily reduced swelling but did not address the underlying arthritis. She has lost significant weight over the past two years, which has made the swelling more noticeable. She is concerned about the impact of her work on her condition and the potential for further complications.  There is a family history of arthritis, with her mother's grandmother being severely affected. She also notes that her knees and hips are problematic, and her fingers are increasingly painful and deformed. She takes ibuprofen for pain relief and has not sought treatment for her knees.  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  No heart issues are reported, allowing her to take ibuprofen for pain relief.      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   Hyperglycemia 12/26/2020   Depression, major, single episode, mild (HCC) 12/26/2020   PAD (peripheral artery disease) (HCC) 12/26/2020   Hyperlipidemia associated with type 2 diabetes mellitus (HCC) 07/19/2020   Diet-controlled diabetes mellitus (HCC) 07/19/2020   Ruptured disc, cervical 06/29/2019   Preventative health care 06/29/2019   Mid back pain 02/20/2019   Psoriasis 02/04/2017   Left bundle branch block 09/02/2015   Ingrown nail 12/04/2014   Pain in toe of left foot 12/04/2014   Tremor 04/15/2012   Nausea and vomiting 04/01/2012   Depression with anxiety 01/25/2012   PVC (premature ventricular contraction) 09/09/2011   Hordeolum externum of left upper eyelid 12/15/2010   Hair loss 10/16/2010   Lymph node enlargement 10/16/2010   Eczema 10/16/2010   Vaginitis and vulvovaginitis 04/22/2010   HEMATURIA, HX OF 04/22/2010   Hyperlipidemia 04/10/2010   Viral warts 03/21/2010   Incontinence of feces 02/21/2009   HIATAL HERNIA WITH REFLUX, HX OF 02/21/2009   CHOLECYSTITIS - ACUTE 08/07/2008   ALLERGIC URTICARIA 03/16/2008   Dermatophytosis of  body 01/20/2008   GERD 09/02/2007   Essential hypertension 01/21/2007   Hematuria 01/21/2007   ANOMALY, 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   THYROID  NODULE, HX OF 01/21/2007   Past Medical History:  Diagnosis Date   Alcohol abuse    recovering alcoholic   Allergic urticaria    Anxiety    Cholecystitis, acute    Pt denies    Congenital anomaly of neck    Depression    Diabetes mellitus without complication (HCC)    Dyslipidemia    Followed by PCP   Gastroparesis    GERD (gastroesophageal reflux disease)    Hematuria    Hiatal hernia    with reflux   Hypertension    stress test 11/2010 normal   Kidney stone    Left bundle branch block (LBBB)    Obesity    PVC's (premature ventricular contractions)    Controlled on beta blocker therapy;  Echo 9/10: EF 55-60%, grade 1 diastolic dysfunction, mild MR, mild LAE   Thyroid  nodule    hx   Tinea corporis    Unspecified contraceptive management    Urinary tract infection    Vaginal Pap smear, abnormal    Past Surgical History:  Procedure Laterality Date   ANTERIOR CERVICAL DECOMP/DISCECTOMY FUSION N/A 06/04/2022   Procedure: ANTERIOR CERVICAL DECOMPRESSION/DISCECTOMY FUSION 3 LEVELS C4-7;  Surgeon: Burnetta Aures, MD;  Location: [facility] OR;  Service: Orthopedics;  Laterality: N/A;  3.5 hrs 3 C-Bed   leep surgery     mid 20's   TONSILLECTOMY     Social History   Tobacco Use   Smoking status: Former    Current packs/day: 0.00    Types: Cigarettes    Quit date: 06/16/2018    Years since quitting: 5.0   Smokeless tobacco: Never  Vaping Use   Vaping status: Never Used  Substance Use Topics   Alcohol use: Yes    Comment: social drinker, ocassional   Drug use: No   Social History   Socioeconomic History   Marital status: Single    Spouse name: Not on file   Number of children: Not on file   Years of education: Not on file   Highest education level: Not on file  Occupational History   Occupation: post office    Employer: [employer]  Tobacco Use   Smoking status: Former    Current packs/day: 0.00    Types: Cigarettes    Quit date: 06/16/2018    Years since quitting: 5.0   Smokeless tobacco: Never  Vaping Use   Vaping status: Never Used  Substance and Sexual Activity   Alcohol use: Yes    Comment: social drinker, ocassional   Drug use: No    Sexual activity: Not Currently    Partners: Male  Other Topics Concern   Not on file  Social History Narrative   Exercising--elliptical    Social Drivers of Health   Financial Resource Strain: Low Risk  (05/18/2023)   Overall Financial Resource Strain (CARDIA)    Difficulty of Paying Living Expenses: Not hard at all  Food Insecurity: No Food Insecurity (05/18/2023)   Hunger Vital Sign    Worried About Running Out of Food in the Last Year: Never true    Ran Out of Food in the Last Year: Never true  Transportation Needs: Not on file  Physical Activity: Sufficiently Active (05/18/2023)   Exercise Vital Sign    Days of Exercise per Week: 5 days    Minutes of Exercise  per Session: 120 min  Stress: Stress Concern Present (05/18/2023)   [employer] of Occupational Health - Occupational Stress Questionnaire    Feeling of Stress : To some extent  Social Connections: Socially Isolated (05/18/2023)   Social Connection and Isolation Panel [NHANES]    Frequency of Communication with Friends and Family: Once a week    Frequency of Social Gatherings with Friends and Family: Once a week    Attends Religious Services: Never    Database Administrator or Organizations: No    Attends Engineer, Structural: Not on file    Marital Status: Never married  Catering Manager Violence: Not on file   Family Status  Relation Name Status   Mother  Alive   Father  Alive   Brother  Alive   Mat Uncle  (Not Specified)   MGM  (Not Specified)   PGM  (Not Specified)   Other  (Not Specified)   Neg Hx  (Not Specified)  No partnership data on file   Family History  Problem Relation Age of Onset   Diabetes Mother    Liver disease Mother    Irritable bowel syndrome Mother    Colon polyps Mother    Thyroid  disease Mother    Hypertension Mother    Breast cancer Mother 50   Hyperlipidemia Father    Hypertension Father    Lung cancer Maternal Uncle    Lung cancer Maternal Grandmother     Heart attack Maternal Grandmother 48   Diabetes Maternal Grandmother    Lung cancer Paternal Grandmother    Coronary artery disease Other        female 1st degree relative 56 yo   Hypertension Other    Colon cancer Neg Hx    Stomach cancer Neg Hx    Allergies  Allergen Reactions   Ace Inhibitors Other (See Comments)    Numbness, tingling of lips.   Bee Venom Swelling    Leg swelling   Lisinopril      Other reaction(s): Other (See Comments) other   Piroxicam Hives      Review of Systems  Constitutional:  Negative for chills, fever and malaise/fatigue.  HENT:  Negative for congestion and hearing loss.   Eyes:  Negative for blurred vision and discharge.  Respiratory:  Negative for cough, sputum production and shortness of breath.   Cardiovascular:  Negative for chest pain, palpitations and leg swelling.  Gastrointestinal:  Negative for abdominal pain, blood in stool, constipation, diarrhea, heartburn, nausea and vomiting.  Genitourinary:  Negative for dysuria, frequency, hematuria and urgency.  Musculoskeletal:  Negative for back pain, falls and myalgias.  Skin:  Negative for rash.  Neurological:  Negative for dizziness, sensory change, loss of consciousness, weakness and headaches.  Endo/Heme/Allergies:  Negative for environmental allergies. Does not bruise/bleed easily.  Psychiatric/Behavioral:  Negative for depression and suicidal ideas. The patient is not nervous/anxious and does not have insomnia.       Objective:     BP 120/86 (BP Location: Left Arm, Patient Position: Sitting)   Pulse 72   Temp 98.6 F (37 C) (Oral)   Resp 16   Ht 5' 5 (1.651 m)   Wt 169 lb 3.2 oz (76.7 kg)   SpO2 98%   BMI 28.16 kg/m  BP Readings from Last 3 Encounters:  07/06/23 120/86  05/18/23 (!) 152/70  04/16/23 130/80   Wt Readings from Last 3 Encounters:  07/06/23 169 lb 3.2 oz (76.7 kg)  05/18/23 165 lb  3.2 oz (74.9 kg)  04/16/23 165 lb (74.8 kg)   SpO2 Readings from Last 3  Encounters:  07/06/23 98%  05/18/23 100%  04/16/23 99%      Physical Exam Vitals and nursing note reviewed.  Constitutional:      General: She is not in acute distress.    Appearance: Normal appearance. She is well-developed.  HENT:     Head: Normocephalic and atraumatic.  Eyes:     General: No scleral icterus.       Right eye: No discharge.        Left eye: No discharge.  Cardiovascular:     Rate and Rhythm: Normal rate and regular rhythm.     Heart sounds: No murmur heard. Pulmonary:     Effort: Pulmonary effort is normal. No respiratory distress.     Breath sounds: Normal breath sounds.  Chest:     Chest wall: Swelling present.       Comments: Swelling over R clavicle, no pain  Musculoskeletal:        General: Normal range of motion.     Cervical back: Normal range of motion and neck supple.     Right lower leg: No edema.     Left lower leg: No edema.  Skin:    General: Skin is warm and dry.  Neurological:     Mental Status: She is alert and oriented to person, place, and time.  Psychiatric:        Mood and Affect: Mood normal.        Behavior: Behavior normal.        Thought Content: Thought content normal.        Judgment: Judgment normal.      No results found for any visits on 07/06/23.  Last CBC Lab Results  Component Value Date   WBC 7.9 05/18/2023   HGB 14.3 05/18/2023   HCT 42.6 05/18/2023   MCV 95.6 05/18/2023   MCH 31.5 05/27/2022   RDW 13.3 05/18/2023   PLT 324.0 05/18/2023   Last metabolic panel Lab Results  Component Value Date   GLUCOSE 90 05/18/2023   NA 137 05/18/2023   K 4.0 05/18/2023   CL 98 05/18/2023   CO2 30 05/18/2023   BUN 13 05/18/2023   CREATININE 0.75 05/18/2023   GFR 89.52 05/18/2023   CALCIUM  10.0 05/18/2023   PROT 7.1 05/18/2023   ALBUMIN 4.4 05/18/2023   BILITOT 0.4 05/18/2023   ALKPHOS 41 05/18/2023   AST 25 05/18/2023   ALT 39 (H) 05/18/2023   ANIONGAP 8 05/27/2022   Last lipids Lab Results   Component Value Date   CHOL 134 02/09/2023   HDL 54.30 02/09/2023   LDLCALC 65 02/09/2023   LDLDIRECT 148.1 01/20/2008   TRIG 71.0 02/09/2023   CHOLHDL 2 02/09/2023   Last hemoglobin A1c Lab Results  Component Value Date   HGBA1C 5.6 02/09/2023   Last thyroid  functions Lab Results  Component Value Date   TSH 0.74 02/05/2022   Last vitamin D  Lab Results  Component Value Date   VD25OH 46.12 08/07/2022   Last vitamin B12 and Folate Lab Results  Component Value Date   VITAMINB12 731 12/28/2019   FOLATE >24.8 12/28/2019      The 10-year ASCVD risk score (Arnett DK, et al., 2019) is: 3%    Assessment & Plan:   Problem List Items Addressed This Visit   None Visit Diagnoses       Arthritis/arthropathy of multiple joints    -  Primary   Relevant Orders   CBC with Differential/Platelet   Comprehensive metabolic panel   Antinuclear Antib (ANA)   Rheumatoid Factor   Sedimentation rate   CRP High sensitivity     Assessment and Plan    Clavicle Osteoarthritis   Chronic osteoarthritis of the clavicle presents with significant swelling and jagged edges on CT scan, worsened by physical activity like throwing packages at work. Surgical intervention poses a high risk due to the proximity to a major artery, with potential for arterial rupture. Previous injections provided only temporary relief. Discussed the possibility of disability due to the risk of worsening condition and arterial rupture. She prefers to avoid surgery due to high risk and negative experiences shared by doctors. A referral to Dr. Baird is made for a second opinion and potential referral to a specialist at [facility] for further evaluation and management options. Discussion of potential disability status with Dr. Baird is also planned.  Neck Pain and Muscle Spasms   Intermittent muscle spasms and shooting pain on the left side of the neck may relate to existing osteoarthritis on the right side. Pain is exacerbated by  activities such as yawning. Symptoms will be monitored and discussed with Dr. Baird during the next visit.  Knee and Hip Osteoarthritis   Chronic osteoarthritis in the knees and hips causes significant pain and functional limitations. No previous medical evaluation for these symptoms has been conducted. An arthritis workup in the lab is ordered to confirm osteoarthritis and rule out other types of arthritis.  Psoriasis   Chronic psoriasis with visible skin changes is noted, with no current treatment plan discussed. A referral to a dermatologist for management is considered.  General Health Maintenance   Routine health maintenance and lab work are due. Gastrointestinal symptoms are well-managed with Align. Labs will be rechecked during the next visit, and Align should be continued for gastrointestinal health.  Follow-up   A follow-up appointment is scheduled next month for a yearly check-up and lab work.        Return if symptoms worsen or fail to improve.    Stelios Kirby R Lowne Chase, DO

## 2023-07-06 NOTE — Patient Instructions (Signed)
Arthritis Arthritis is a term that is commonly used to refer to joint pain or joint disease. There are more than 100 types of arthritis. What are the causes? The most common cause of this condition is wear and tear of a joint. Other causes include: Gout. Inflammation of a joint. An infection of a joint. Sprains and other injuries near the joint. A reaction to medicines or drugs, or an allergic reaction. In some cases, the cause may not be known. What are the signs or symptoms? The main symptom of this condition is pain in the joint during movement. Other symptoms include: Redness, swelling, or stiffness at a joint. Warmth coming from the joint. Fever. Overall feeling of illness. How is this diagnosed? This condition may be diagnosed with a physical exam and tests, including: Blood tests. Urine tests. Imaging tests, such as X-rays, an MRI, or a CT scan. Sometimes, fluid is removed from a joint for testing. How is this treated? This condition may be treated with: Treatment of the cause, if it is known. Rest. Raising (elevating) the joint. Applying cold or hot packs to the joint. Medicines to improve symptoms and reduce inflammation. Injections of a steroid, such as cortisone, into the joint to help reduce pain and inflammation. Depending on the cause of your arthritis, you may need to make lifestyle changes to reduce stress on your joint. Changes may include: Exercising more. Losing weight. Follow these instructions at home: Medicines Take over-the-counter and prescription medicines only as told by your health care provider. Do not take aspirin to relieve pain if your health care provider thinks that gout may be causing your pain. Activity Rest your joint if told by your health care provider. Rest is important when your disease is active and your joint feels painful, swollen, or stiff. Avoid activities that make the pain worse. Balance activity with rest. Exercise your joint  regularly with range-of-motion exercises as told by your health care provider. Try doing low-impact exercise, such as: Swimming. Water aerobics. Biking. Walking. Managing pain, stiffness, and swelling     If directed, put ice on the affected joint. To do this: Put ice in a plastic bag. Place a towel between your skin and the bag. Leave the ice on for 20 minutes, 2-3 times a day. Remove the ice if your skin turns bright red. This is very important. If you cannot feel pain, heat, or cold, you have a greater risk of damage to the area. If your joint is swollen, raise (elevate) it above the level of your heart if directed by your health care provider. If your joint feels stiff in the morning, try taking a warm shower. If directed, apply heat to the affected area as often as told by your health care provider. Use the heat source that your health care provider recommends, such as a moist heat pack or a heating pad. To apply heat: Place a towel between your skin and the heat source. Leave the heat on for 20-30 minutes. Remove the heat if your skin turns bright red. This is especially important if you are unable to feel pain, heat, or cold. You have a greater risk of getting burned. General instructions Maintain a healthy weight. Follow instructions from your health care provider for weight control. Do not use any products that contain nicotine or tobacco. These products include cigarettes, chewing tobacco, and vaping devices, such as e-cigarettes. If you need help quitting, ask your health care provider. Keep all follow-up visits. This is important. Where  to find more information [employer]: www.niams.http://www.myers.net/ Contact a health care provider if: The pain gets worse. You have a fever. Get help right away if: You develop severe joint pain, swelling, or redness. Many joints become painful and swollen. You develop severe back pain. You develop severe weakness in your  leg. Summary Arthritis is a term that is commonly used to refer to joint pain or joint disease. There are more than 100 types of arthritis. The most common cause of this condition is wear and tear of a joint. Other causes include gout, inflammation or infection of the joint, sprains, or allergies. Symptoms of this condition include redness, swelling, or stiffness of the joint. Other symptoms include warmth, fever, or feeling ill. This condition is treated with rest, elevation, medicines, and applying cold or hot packs. Follow your health care provider's instructions about medicines, activity, exercises, and other home care treatments. This information is not intended to replace advice given to you by your health care provider. Make sure you discuss any questions you have with your health care provider. Document Revised: 02/25/2021 Document Reviewed: 02/25/2021 Elsevier Patient Education  2024 [employer].

## 2023-07-08 DIAGNOSIS — M25511 Pain in right shoulder: Secondary | ICD-10-CM | POA: Diagnosis not present

## 2023-07-08 LAB — ANA: Anti Nuclear Antibody (ANA): NEGATIVE

## 2023-07-08 LAB — RHEUMATOID FACTOR: Rheumatoid fact SerPl-aCnc: <10 [IU]/mL

## 2023-07-13 ENCOUNTER — Other Ambulatory Visit: Payer: Self-pay | Admitting: Family Medicine

## 2023-07-13 DIAGNOSIS — I10 Essential (primary) hypertension: Secondary | ICD-10-CM

## 2023-07-13 DIAGNOSIS — K219 Gastro-esophageal reflux disease without esophagitis: Secondary | ICD-10-CM

## 2023-07-18 ENCOUNTER — Other Ambulatory Visit: Payer: Self-pay | Admitting: Family Medicine

## 2023-07-18 DIAGNOSIS — E1165 Type 2 diabetes mellitus with hyperglycemia: Secondary | ICD-10-CM

## 2023-07-30 DIAGNOSIS — M25511 Pain in right shoulder: Secondary | ICD-10-CM | POA: Diagnosis not present

## 2023-08-03 ENCOUNTER — Encounter: Payer: Self-pay | Admitting: Family Medicine

## 2023-08-03 ENCOUNTER — Ambulatory Visit: Admitting: Family Medicine

## 2023-08-03 VITALS — BP 134/88 | HR 77 | Temp 98.3°F | Resp 16 | Ht 65.0 in | Wt 174.6 lb

## 2023-08-03 DIAGNOSIS — J4520 Mild intermittent asthma, uncomplicated: Secondary | ICD-10-CM | POA: Diagnosis not present

## 2023-08-03 DIAGNOSIS — L4050 Arthropathic psoriasis, unspecified: Secondary | ICD-10-CM

## 2023-08-03 DIAGNOSIS — Z981 Arthrodesis status: Secondary | ICD-10-CM

## 2023-08-03 DIAGNOSIS — M542 Cervicalgia: Secondary | ICD-10-CM | POA: Diagnosis not present

## 2023-08-03 MED ORDER — AIRSUPRA 90-80 MCG/ACT IN AERO
2.0000 | INHALATION_SPRAY | Freq: Four times a day (QID) | RESPIRATORY_TRACT | 5 refills | Status: DC | PRN
Start: 2023-08-03 — End: 2023-08-05

## 2023-08-03 NOTE — Progress Notes (Unsigned)
 Established Patient Office Visit  Subjective   Patient ID: [name] Ramos, female    DOB: [birth year]  Age: 58 y.o. MRN: [id number]  Chief Complaint  Patient presents with  . Arthritis    Per Pt, Dr. Jillyn Hidden would like pcp to refer to Rheumatology   . Follow-up    HPI Discussed the use of AI scribe software for clinical note transcription with the patient, who gave verbal consent to proceed.  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  She has symptoms suggestive of psoriatic arthritis, including joint issues. Previous tests for rheumatoid arthritis were negative. She has a history of plaque psoriasis, which has been persistent for a long time. She is currently using clobetasol ointment for treatment, but it is not effective on her scalp and ears. An ENT previously identified psoriasis in her ears.  She experiences shooting pain in her thumb, which may eventually require joint replacement. She prefers to delay this intervention due to potential loss of movement.  She reports shooting pain in her neck, which initially resolved but has since returned, particularly when yawning or stretching. She describes a 'crunching' sensation when pressing on the area, which is near a previous cervical spine surgery incision. She underwent a cervical spine fusion involving three discs, with one disc left untreated to avoid disability.  Recent imaging has shown bone spurs across her ribcage.  She uses albuterol as needed for breathing difficulties associated with allergies.  She has a history of lichen sclerosus, previously treated with ointment. The condition is currently managed by another physician.      Patient Active Problem List   Diagnosis Date Noted  . Generalized anxiety disorder 07/28/2022  . Anaphylactic syndrome 07/28/2022  . Lichen sclerosus of female genitalia 07/28/2022  . Dyspepsia 07/28/2022   . Hiatal hernia 07/28/2022  . Alcohol withdrawal syndrome without complication (HCC) 07/28/2022  . Cervical disc herniation 06/04/2022  . Thoracic spine pain 03/18/2022  . Rib pain 02/06/2021  . 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 Hyperglycemia 12/26/2020  . Depression, major, single episode, mild (HCC) 12/26/2020  . PAD (peripheral artery disease) (HCC) 12/26/2020  . Hyperlipidemia associated with type 2 diabetes mellitus (HCC) 07/19/2020  . Diet-controlled diabetes mellitus (HCC) 07/19/2020  . Ruptured disc, cervical 06/29/2019  . Preventative health care 06/29/2019  . Mid back pain 02/20/2019  . Psoriasis 02/04/2017  . Left bundle branch block 09/02/2015  . Ingrown nail 12/04/2014  . Pain in toe of left foot 12/04/2014  . Tremor 04/15/2012  . Nausea and vomiting 04/01/2012  . Depression with anxiety 01/25/2012  . PVC (premature ventricular contraction) 09/09/2011  . Hordeolum externum of left upper eyelid 12/15/2010  . Hair loss 10/16/2010  . Lymph node enlargement 10/16/2010  . Eczema 10/16/2010  . Vaginitis and vulvovaginitis 04/22/2010  . HEMATURIA, HX OF 04/22/2010  . Hyperlipidemia 04/10/2010  . Viral warts 03/21/2010  . Incontinence of feces 02/21/2009  . HIATAL HERNIA WITH REFLUX, HX OF 02/21/2009  . CHOLECYSTITIS - ACUTE 08/07/2008  . ALLERGIC URTICARIA 03/16/2008  . Dermatophytosis of body 01/20/2008  . GERD 09/02/2007  . Essential hypertension 01/21/2007  . Hematuria 01/21/2007  . 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 THYROID NODULE, HX OF 01/21/2007   Past Medical History:  Diagnosis Date  . Alcohol abuse    recovering alcoholic  . Allergic urticaria   . Anxiety   . Cholecystitis, acute  Pt denies  . Congenital anomaly of neck   . Depression   . Diabetes mellitus without complication (HCC)   . Dyslipidemia    Followed by PCP  . Gastroparesis   . GERD (gastroesophageal reflux disease)   . Hematuria   . Hiatal hernia    with reflux  .  Hypertension    stress test 11/2010 normal  . Kidney stone   . Left bundle branch block (LBBB)   . Obesity   . PVC's (premature ventricular contractions)    Controlled on beta blocker therapy;  Echo 9/10: EF 55-60%, grade 1 diastolic dysfunction, mild MR, mild LAE  . Thyroid nodule    hx  . Tinea corporis   . Unspecified contraceptive management   . Urinary tract infection   . Vaginal Pap smear, abnormal    Past Surgical History:  Procedure Laterality Date  . ANTERIOR CERVICAL DECOMP/DISCECTOMY FUSION N/A 06/04/2022   Procedure: ANTERIOR CERVICAL DECOMPRESSION/DISCECTOMY FUSION 3 LEVELS C4-7;  Surgeon: Venita Lick, MD;  Location: [facility] OR;  Service: Orthopedics;  Laterality: N/A;  3.5 hrs 3 C-Bed  . leep surgery     mid 20's  . TONSILLECTOMY     Social History   Tobacco Use  . Smoking status: Former    Current packs/day: 0.00    Types: Cigarettes    Quit date: 06/16/2018    Years since quitting: 5.1  . Smokeless tobacco: Never  Vaping Use  . Vaping status: Never Used  Substance Use Topics  . Alcohol use: Yes    Comment: social drinker, ocassional  . Drug use: No   Social History   Socioeconomic History  . Marital status: Single    Spouse name: Not on file  . Number of children: Not on file  . Years of education: Not on file  . Highest education level: Not on file  Occupational History  . Occupation: post Public affairs consultant: [employer]  Tobacco Use  . Smoking status: Former    Current packs/day: 0.00    Types: Cigarettes    Quit date: 06/16/2018    Years since quitting: 5.1  . Smokeless tobacco: Never  Vaping Use  . Vaping status: Never Used  Substance and Sexual Activity  . Alcohol use: Yes    Comment: social drinker, ocassional  . Drug use: No  . Sexual activity: Not Currently    Partners: Male  Other Topics Concern  . Not on file  Social History Narrative   Exercising--elliptical    Social Drivers of Health   Financial Resource Strain: Low Risk   (05/18/2023)   Overall Financial Resource Strain (CARDIA)   . Difficulty of Paying Living Expenses: Not hard at all  Food Insecurity: No Food Insecurity (05/18/2023)   Hunger Vital Sign   . Worried About Programme researcher, broadcasting/film/video in the Last Year: Never true   . Ran Out of Food in the Last Year: Never true  Transportation Needs: Not on file  Physical Activity: Sufficiently Active (05/18/2023)   Exercise Vital Sign   . Days of Exercise per Week: 5 days   . Minutes of Exercise per Session: 120 min  Stress: Stress Concern Present (05/18/2023)   [employer] of Occupational Health - Occupational Stress Questionnaire   . Feeling of Stress : To some extent  Social Connections: Socially Isolated (05/18/2023)   Social Connection and Isolation Panel [NHANES]   . Frequency of Communication with Friends and Family: Once a week   .  Frequency of Social Gatherings with Friends and Family: Once a week   . Attends Religious Services: Never   . Active Member of Clubs or Organizations: No   . Attends Banker Meetings: Not on file   . Marital Status: Never married  Intimate Partner Violence: Not on file   Family Status  Relation Name Status  . Mother  Alive  . Father  Alive  . Brother  Alive  . Mat Uncle  (Not Specified)  . MGM  (Not Specified)  . PGM  (Not Specified)  . Other  (Not Specified)  . Neg Hx  (Not Specified)  No partnership data on file   Family History  Problem Relation Age of Onset  . Diabetes Mother   . Liver disease Mother   . Irritable bowel syndrome Mother   . Colon polyps Mother   . Thyroid disease Mother   . Hypertension Mother   . Breast cancer Mother 45  . Hyperlipidemia Father   . Hypertension Father   . Lung cancer Maternal Uncle   . Lung cancer Maternal Grandmother   . Heart attack Maternal Grandmother 48  . Diabetes Maternal Grandmother   . Lung cancer Paternal Grandmother   . Coronary artery disease Other        female 1st degree relative 56  yo  . Hypertension Other   . Colon cancer Neg Hx   . Stomach cancer Neg Hx    Allergies  Allergen Reactions  . Ace Inhibitors Other (See Comments)    Numbness, tingling of lips.  . Bee Venom Swelling    Leg swelling  . Lisinopril     Other reaction(s): Other (See Comments) other  . Piroxicam Hives      Review of Systems  Constitutional:  Negative for chills, fever and malaise/fatigue.  HENT:  Negative for congestion and hearing loss.   Eyes:  Negative for blurred vision and discharge.  Respiratory:  Negative for cough, sputum production and shortness of breath.   Cardiovascular:  Negative for chest pain, palpitations and leg swelling.  Gastrointestinal:  Negative for abdominal pain, blood in stool, constipation, diarrhea, heartburn, nausea and vomiting.  Genitourinary:  Negative for dysuria, frequency, hematuria and urgency.  Musculoskeletal:  Negative for back pain, falls and myalgias.  Skin:  Negative for rash.  Neurological:  Negative for dizziness, sensory change, loss of consciousness, weakness and headaches.  Endo/Heme/Allergies:  Negative for environmental allergies. Does not bruise/bleed easily.  Psychiatric/Behavioral:  Negative for depression and suicidal ideas. The patient is not nervous/anxious and does not have insomnia.       Objective:     BP 134/88 (BP Location: Left Arm, Patient Position: Sitting, Cuff Size: Normal)   Pulse 77   Temp 98.3 F (36.8 C) (Oral)   Resp 16   Ht 5\' 5"  (1.651 m)   Wt 174 lb 9.6 oz (79.2 kg)   SpO2 99%   BMI 29.05 kg/m  BP Readings from Last 3 Encounters:  08/03/23 134/88  07/06/23 120/86  05/18/23 (!) 152/70   Wt Readings from Last 3 Encounters:  08/03/23 174 lb 9.6 oz (79.2 kg)  07/06/23 169 lb 3.2 oz (76.7 kg)  05/18/23 165 lb 3.2 oz (74.9 kg)   SpO2 Readings from Last 3 Encounters:  08/03/23 99%  07/06/23 98%  05/18/23 100%      Physical Exam Vitals and nursing note reviewed.  Constitutional:       General: She is not in acute distress.  Appearance: Normal appearance. She is well-developed.  HENT:     Head: Normocephalic and atraumatic.  Eyes:     General: No scleral icterus.       Right eye: No discharge.        Left eye: No discharge.  Cardiovascular:     Rate and Rhythm: Normal rate and regular rhythm.     Heart sounds: No murmur heard. Pulmonary:     Effort: Pulmonary effort is normal. No respiratory distress.     Breath sounds: Normal breath sounds.  Musculoskeletal:        General: Normal range of motion.     Cervical back: Normal range of motion and neck supple.     Right lower leg: No edema.     Left lower leg: No edema.  Skin:    General: Skin is warm and dry.  Neurological:     Mental Status: She is alert and oriented to person, place, and time.  Psychiatric:        Mood and Affect: Mood normal.        Behavior: Behavior normal.        Thought Content: Thought content normal.        Judgment: Judgment normal.     No results found for any visits on 08/03/23.  {Labs (Optional):23779}  The 10-year ASCVD risk score (Arnett DK, et al., 2019) is: 3.7%    Assessment & Plan:   Problem List Items Addressed This Visit   None Visit Diagnoses       Psoriatic arthritis (HCC)    -  Primary   Relevant Orders   Ambulatory referral to Rheumatology     Mild intermittent asthma, unspecified whether complicated       Relevant Medications   Albuterol-Budesonide (AIRSUPRA) 90-80 MCG/ACT AERO     Pain, neck       Relevant Orders   US Carotid Bilateral     History of fusion of cervical spine           No follow-ups on file.    Donato Schultz, DO

## 2023-08-05 ENCOUNTER — Other Ambulatory Visit: Payer: Self-pay | Admitting: Family Medicine

## 2023-08-05 ENCOUNTER — Encounter: Payer: Self-pay | Admitting: Family Medicine

## 2023-08-05 MED ORDER — ALBUTEROL SULFATE HFA 108 (90 BASE) MCG/ACT IN AERS: 2.0000 | INHALATION_SPRAY | Freq: Four times a day (QID) | RESPIRATORY_TRACT | 2 refills | Status: AC | PRN

## 2023-08-06 ENCOUNTER — Ambulatory Visit

## 2023-08-06 DIAGNOSIS — M542 Cervicalgia: Secondary | ICD-10-CM | POA: Diagnosis not present

## 2023-08-09 ENCOUNTER — Encounter: Payer: Self-pay | Admitting: Family Medicine

## 2023-08-10 ENCOUNTER — Other Ambulatory Visit: Payer: Self-pay | Admitting: Family Medicine

## 2023-08-10 DIAGNOSIS — Z3009 Encounter for other general counseling and advice on contraception: Secondary | ICD-10-CM

## 2023-08-11 ENCOUNTER — Other Ambulatory Visit (HOSPITAL_BASED_OUTPATIENT_CLINIC_OR_DEPARTMENT_OTHER): Payer: Self-pay

## 2023-08-12 ENCOUNTER — Encounter: Payer: Federal, State, Local not specified - PPO | Admitting: Family Medicine

## 2023-08-19 ENCOUNTER — Encounter: Payer: Self-pay | Admitting: Family Medicine

## 2023-08-19 ENCOUNTER — Ambulatory Visit: Payer: Federal, State, Local not specified - PPO | Admitting: Family Medicine

## 2023-08-19 VITALS — BP 132/86 | HR 73 | Temp 98.2°F | Resp 16 | Ht 65.0 in | Wt 173.8 lb

## 2023-08-19 DIAGNOSIS — I10 Essential (primary) hypertension: Secondary | ICD-10-CM | POA: Diagnosis not present

## 2023-08-19 DIAGNOSIS — R1013 Epigastric pain: Secondary | ICD-10-CM

## 2023-08-19 DIAGNOSIS — E785 Hyperlipidemia, unspecified: Secondary | ICD-10-CM

## 2023-08-19 DIAGNOSIS — E1165 Type 2 diabetes mellitus with hyperglycemia: Secondary | ICD-10-CM

## 2023-08-19 DIAGNOSIS — Z3009 Encounter for other general counseling and advice on contraception: Secondary | ICD-10-CM

## 2023-08-19 DIAGNOSIS — R112 Nausea with vomiting, unspecified: Secondary | ICD-10-CM

## 2023-08-19 DIAGNOSIS — Z23 Encounter for immunization: Secondary | ICD-10-CM

## 2023-08-19 DIAGNOSIS — T782XXA Anaphylactic shock, unspecified, initial encounter: Secondary | ICD-10-CM

## 2023-08-19 DIAGNOSIS — Z7985 Long-term (current) use of injectable non-insulin antidiabetic drugs: Secondary | ICD-10-CM

## 2023-08-19 DIAGNOSIS — F411 Generalized anxiety disorder: Secondary | ICD-10-CM | POA: Diagnosis not present

## 2023-08-19 DIAGNOSIS — E2839 Other primary ovarian failure: Secondary | ICD-10-CM

## 2023-08-19 DIAGNOSIS — Z7984 Long term (current) use of oral hypoglycemic drugs: Secondary | ICD-10-CM

## 2023-08-19 LAB — COMPREHENSIVE METABOLIC PANEL WITH GFR
ALT: 21 U/L (ref 0–35)
AST: 20 U/L (ref 0–37)
Albumin: 4.7 g/dL (ref 3.5–5.2)
Alkaline Phosphatase: 50 U/L (ref 39–117)
BUN: 10 mg/dL (ref 6–23)
CO2: 27 meq/L (ref 19–32)
Calcium: 10.1 mg/dL (ref 8.4–10.5)
Chloride: 103 meq/L (ref 96–112)
Creatinine, Ser: 0.62 mg/dL (ref 0.40–1.20)
GFR: 99.96 mL/min (ref >60.00)
Glucose, Bld: 89 mg/dL (ref 70–99)
Potassium: 4.3 meq/L (ref 3.5–5.1)
Sodium: 139 meq/L (ref 135–145)
Total Bilirubin: 0.6 mg/dL (ref 0.2–1.2)
Total Protein: 7 g/dL (ref 6.0–8.3)

## 2023-08-19 LAB — LIPID PANEL
Cholesterol: 156 mg/dL (ref 0–200)
HDL: 69 mg/dL (ref >39.00)
LDL Cholesterol: 71 mg/dL (ref 0–99)
NonHDL: 86.5
Total CHOL/HDL Ratio: 2
Triglycerides: 76 mg/dL (ref 0.0–149.0)
VLDL: 15.2 mg/dL (ref 0.0–40.0)

## 2023-08-19 LAB — HEMOGLOBIN A1C: Hgb A1c MFr Bld: 5.4 % (ref 4.6–6.5)

## 2023-08-19 LAB — CBC WITH DIFFERENTIAL/PLATELET
Basophils Absolute: 0 K/uL (ref 0.0–0.1)
Basophils Relative: 0.4 % (ref 0.0–3.0)
Eosinophils Absolute: 0.1 K/uL (ref 0.0–0.7)
Eosinophils Relative: 0.9 % (ref 0.0–5.0)
HCT: 41.2 % (ref 36.0–46.0)
Hemoglobin: 14 g/dL (ref 12.0–15.0)
Lymphocytes Relative: 28.3 % (ref 12.0–46.0)
Lymphs Abs: 1.7 K/uL (ref 0.7–4.0)
MCHC: 33.9 g/dL (ref 30.0–36.0)
MCV: 97.1 fl (ref 78.0–100.0)
Monocytes Absolute: 0.5 K/uL (ref 0.1–1.0)
Monocytes Relative: 7.9 % (ref 3.0–12.0)
Neutro Abs: 3.7 K/uL (ref 1.4–7.7)
Neutrophils Relative %: 62.5 % (ref 43.0–77.0)
Platelets: 266 K/uL (ref 150.0–400.0)
RBC: 4.24 Mil/uL (ref 3.87–5.11)
RDW: 13.4 % (ref 11.5–15.5)
WBC: 5.9 K/uL (ref 4.0–10.5)

## 2023-08-19 LAB — MICROALBUMIN / CREATININE URINE RATIO
Creatinine,U: 137.9 mg/dL
Microalb Creat Ratio: UNDETERMINED mg/g (ref 0.0–30.0)
Microalb, Ur: <0.7 mg/dL

## 2023-08-19 MED ORDER — METHOCARBAMOL 500 MG PO TABS: 500.0000 mg | ORAL_TABLET | Freq: Three times a day (TID) | ORAL | 1 refills | Status: AC | PRN

## 2023-08-19 MED ORDER — HYDROCHLOROTHIAZIDE 25 MG PO TABS: 25.0000 mg | ORAL_TABLET | Freq: Every day | ORAL | 1 refills | Status: DC

## 2023-08-19 MED ORDER — ATENOLOL 100 MG PO TABS: 100.0000 mg | ORAL_TABLET | Freq: Every day | ORAL | 3 refills | Status: DC

## 2023-08-19 MED ORDER — ONDANSETRON HCL 4 MG PO TABS: 4.0000 mg | ORAL_TABLET | Freq: Three times a day (TID) | ORAL | 0 refills | Status: DC | PRN

## 2023-08-19 MED ORDER — EPINEPHRINE 0.3 MG/0.3ML IJ SOAJ: 0.3000 mg | INTRAMUSCULAR | 1 refills | Status: AC | PRN

## 2023-08-19 MED ORDER — SEMAGLUTIDE (2 MG/DOSE) 8 MG/3ML ~~LOC~~ SOPN: 2.0000 mg | PEN_INJECTOR | SUBCUTANEOUS | 3 refills | Status: DC

## 2023-08-19 MED ORDER — CLOBETASOL PROPIONATE 0.05 % EX CREA: 1.0000 | TOPICAL_CREAM | Freq: Two times a day (BID) | CUTANEOUS | 2 refills | Status: AC

## 2023-08-19 MED ORDER — ATORVASTATIN CALCIUM 10 MG PO TABS: 10.0000 mg | ORAL_TABLET | Freq: Every day | ORAL | 3 refills | Status: AC

## 2023-08-19 MED ORDER — ALPRAZOLAM 0.25 MG PO TABS: 0.2500 mg | ORAL_TABLET | Freq: Three times a day (TID) | ORAL | 1 refills | Status: AC | PRN

## 2023-08-19 NOTE — Progress Notes (Signed)
 Established Patient Office Visit  Subjective   Patient ID: [name] Ramos, female    DOB: April 03, 1966  Age: 58 y.o. MRN: [id number]  Chief Complaint  Patient presents with   Annual Exam    Pt states fasting     HPI Discussed the use of AI scribe software for clinical note transcription with the patient, who gave verbal consent to proceed.  Discussed the use of AI scribe software for clinical note transcription with the patient, who gave verbal consent to proceed.  History of Present Illness   [name] Ramos is a 58 year old female who presents for medication management and follow-up of her chronic conditions.  She is managing multiple chronic conditions and is here for medication management. Her current medications include Xanax, atenolol, atorvastatin, clobetasol, epinephrine, metformin, and methocarbamol. She has a surplus of metformin and does not require a refill at this time. She is allergic to wasps but not to bees.  She is on Depo-Provera for management of extremely heavy bleeding, despite being past menopause. She has been on this medication for a long time and is apprehensive about stopping it due to concerns about potential side effects such as brain tumors and osteoporosis.  She humorously describes herself as being in the 'fitness protection program' and acknowledges the need to start going to the gym, although she has not yet begun regular exercise.  Recently, her husband's finger injury required a late night at the emergency room and follow-up with a hand surgeon, contributing to her recent lack of sleep.         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   Hyperglycemia 12/26/2020   Depression, major, single episode, mild (HCC) 12/26/2020   PAD (peripheral artery disease) (HCC) 12/26/2020   Hyperlipidemia associated with type 2 diabetes mellitus (HCC) 07/19/2020   Diet-controlled diabetes mellitus (HCC) 07/19/2020   Ruptured disc, cervical 06/29/2019   Preventative health care 06/29/2019   Mid back pain 02/20/2019   Psoriasis 02/04/2017   Left bundle branch block 09/02/2015   Ingrown nail 12/04/2014   Pain in toe of left foot 12/04/2014   Tremor 04/15/2012   Nausea and vomiting 04/01/2012   Depression with anxiety 01/25/2012   PVC (premature ventricular contraction) 09/09/2011   Hordeolum externum of left upper eyelid 12/15/2010   Hair loss 10/16/2010   Lymph node enlargement 10/16/2010   Eczema 10/16/2010   Vaginitis and vulvovaginitis 04/22/2010   HEMATURIA, HX OF 04/22/2010   Hyperlipidemia 04/10/2010   Viral warts 03/21/2010   Incontinence of feces 02/21/2009   HIATAL HERNIA WITH REFLUX, HX OF 02/21/2009   CHOLECYSTITIS - ACUTE 08/07/2008   ALLERGIC URTICARIA 03/16/2008   Dermatophytosis of body 01/20/2008   GERD 09/02/2007   Essential hypertension 01/21/2007   Hematuria 01/21/2007   ANOMALY, 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   THYROID NODULE, HX OF 01/21/2007   Past Medical History:  Diagnosis Date   Alcohol abuse    recovering alcoholic   Allergic urticaria    Anxiety    Cholecystitis, acute    Pt denies   Congenital anomaly of neck    Depression    Diabetes mellitus  without complication (HCC)    Dyslipidemia    Followed by PCP   Gastroparesis    GERD (gastroesophageal reflux disease)    Hematuria    Hiatal hernia    with reflux   Hypertension    stress test 11/2010 normal   Kidney stone    Left bundle branch block (LBBB)    Obesity    PVC's (premature ventricular contractions)    Controlled on beta blocker therapy;  Echo 9/10: EF  55-60%, grade 1 diastolic dysfunction, mild MR, mild LAE   Thyroid nodule    hx   Tinea corporis    Unspecified contraceptive management    Urinary tract infection    Vaginal Pap smear, abnormal    Past Surgical History:  Procedure Laterality Date   ANTERIOR CERVICAL DECOMP/DISCECTOMY FUSION N/A 06/04/2022   Procedure: ANTERIOR CERVICAL DECOMPRESSION/DISCECTOMY FUSION 3 LEVELS C4-7;  Surgeon: Venita Lick, MD;  Location: [facility] OR;  Service: Orthopedics;  Laterality: N/A;  3.5 hrs 3 C-Bed   leep surgery     mid 20's   TONSILLECTOMY     Social History   Tobacco Use   Smoking status: Former    Current packs/day: 0.00    Types: Cigarettes    Quit date: 06/16/2018    Years since quitting: 5.1   Smokeless tobacco: Never  Vaping Use   Vaping status: Never Used  Substance Use Topics   Alcohol use: Yes    Comment: social drinker, ocassional   Drug use: No   Social History   Socioeconomic History   Marital status: Single    Spouse name: Not on file   Number of children: Not on file   Years of education: Not on file   Highest education level: Not on file  Occupational History   Occupation: post office    Employer: [employer]  Tobacco Use   Smoking status: Former    Current packs/day: 0.00    Types: Cigarettes    Quit date: 06/16/2018    Years since quitting: 5.1   Smokeless tobacco: Never  Vaping Use   Vaping status: Never Used  Substance and Sexual Activity   Alcohol use: Yes    Comment: social drinker, ocassional   Drug use: No   Sexual activity: Not Currently    Partners: Male  Other Topics Concern   Not on file  Social History Narrative   Exercising--no   Social Drivers of Health   Financial Resource Strain: Low Risk  (05/18/2023)   Overall Financial Resource Strain (CARDIA)    Difficulty of Paying Living Expenses: Not hard at all  Food Insecurity: No Food Insecurity (05/18/2023)   Hunger Vital Sign    Worried About Running Out of Food in the Last Year:  Never true    Ran Out of Food in the Last Year: Never true  Transportation Needs: Not on file  Physical Activity: Sufficiently Active (05/18/2023)   Exercise Vital Sign    Days of Exercise per Week: 5 days    Minutes of Exercise per Session: 120 min  Stress: Stress Concern Present (05/18/2023)   [employer] of Occupational Health - Occupational Stress Questionnaire    Feeling of Stress : To some extent  Social Connections: Socially Isolated (05/18/2023)   Social Connection and Isolation Panel [NHANES]    Frequency of Communication with Friends and Family: Once a week    Frequency of Social Gatherings with Friends and Family: Once a week    Attends Religious Services: Never  Active Member of Clubs or Organizations: No    Attends Engineer, structural: Not on file    Marital Status: Never married  Intimate Partner Violence: Not on file   Family Status  Relation Name Status   Mother  Alive   Father  Alive   Brother  Alive   Mat Uncle  (Not Specified)   MGM  (Not Specified)   PGM  (Not Specified)   Other  (Not Specified)   Neg Hx  (Not Specified)  No partnership data on file   Family History  Problem Relation Age of Onset   Diabetes Mother    Liver disease Mother    Irritable bowel syndrome Mother    Colon polyps Mother    Thyroid disease Mother    Hypertension Mother    Breast cancer Mother 84   Hyperlipidemia Father    Hypertension Father    Lung cancer Maternal Uncle    Lung cancer Maternal Grandmother    Heart attack Maternal Grandmother 48   Diabetes Maternal Grandmother    Lung cancer Paternal Grandmother    Coronary artery disease Other        female 1st degree relative 56 yo   Hypertension Other    Colon cancer Neg Hx    Stomach cancer Neg Hx    Allergies  Allergen Reactions   Ace Inhibitors Other (See Comments)    Numbness, tingling of lips.   Bee Venom Swelling    Leg swelling   Lisinopril     Other reaction(s): Other (See  Comments) other   Piroxicam Hives   Wasp Venom       Review of Systems  Constitutional:  Negative for chills, fever and malaise/fatigue.  HENT:  Negative for congestion and hearing loss.   Eyes:  Negative for blurred vision and discharge.  Respiratory:  Negative for cough, sputum production and shortness of breath.   Cardiovascular:  Negative for chest pain, palpitations and leg swelling.  Gastrointestinal:  Negative for abdominal pain, blood in stool, constipation, diarrhea, heartburn, nausea and vomiting.  Genitourinary:  Negative for dysuria, frequency, hematuria and urgency.  Musculoskeletal:  Negative for back pain, falls and myalgias.  Skin:  Negative for rash.  Neurological:  Negative for dizziness, sensory change, loss of consciousness, weakness and headaches.  Endo/Heme/Allergies:  Negative for environmental allergies. Does not bruise/bleed easily.  Psychiatric/Behavioral:  Negative for depression and suicidal ideas. The patient is not nervous/anxious and does not have insomnia.       Objective:     BP 132/86 (BP Location: Left Arm, Patient Position: Sitting)   Pulse 73   Temp 98.2 F (36.8 C) (Oral)   Resp 16   Ht 5\' 5"  (1.651 m)   Wt 173 lb 12.8 oz (78.8 kg)   SpO2 98%   BMI 28.92 kg/m  BP Readings from Last 3 Encounters:  08/19/23 132/86  08/03/23 134/88  07/06/23 120/86   Wt Readings from Last 3 Encounters:  08/19/23 173 lb 12.8 oz (78.8 kg)  08/03/23 174 lb 9.6 oz (79.2 kg)  07/06/23 169 lb 3.2 oz (76.7 kg)   SpO2 Readings from Last 3 Encounters:  08/19/23 98%  08/03/23 99%  07/06/23 98%      Physical Exam Vitals and nursing note reviewed.  Constitutional:      General: She is not in acute distress.    Appearance: Normal appearance. She is well-developed.  HENT:     Head: Normocephalic and atraumatic.  Right Ear: Tympanic membrane, ear canal and external ear normal. There is no impacted cerumen.     Left Ear: Tympanic membrane, ear canal  and external ear normal. There is no impacted cerumen.     Nose: Nose normal.     Mouth/Throat:     Mouth: Mucous membranes are moist.     Pharynx: Oropharynx is clear. No oropharyngeal exudate or posterior oropharyngeal erythema.  Eyes:     General: No scleral icterus.       Right eye: No discharge.        Left eye: No discharge.     Conjunctiva/sclera: Conjunctivae normal.     Pupils: Pupils are equal, round, and reactive to light.  Neck:     Thyroid: No thyromegaly or thyroid tenderness.     Vascular: No JVD.  Cardiovascular:     Rate and Rhythm: Normal rate and regular rhythm.     Heart sounds: Normal heart sounds. No murmur heard. Pulmonary:     Effort: Pulmonary effort is normal. No respiratory distress.     Breath sounds: Normal breath sounds.  Abdominal:     General: Bowel sounds are normal. There is no distension.     Palpations: Abdomen is soft. There is no mass.     Tenderness: There is no abdominal tenderness. There is no guarding or rebound.  Genitourinary:    Vagina: Normal.  Musculoskeletal:        General: Normal range of motion.     Cervical back: Normal range of motion and neck supple.     Right lower leg: No edema.     Left lower leg: No edema.  Lymphadenopathy:     Cervical: No cervical adenopathy.  Skin:    General: Skin is warm and dry.     Findings: No erythema or rash.  Neurological:     Mental Status: She is alert and oriented to person, place, and time.     Cranial Nerves: No cranial nerve deficit.     Deep Tendon Reflexes: Reflexes are normal and symmetric.  Psychiatric:        Mood and Affect: Mood normal.        Behavior: Behavior normal.        Thought Content: Thought content normal.        Judgment: Judgment normal.      No results found for any visits on 08/19/23.  Last CBC Lab Results  Component Value Date   WBC 5.8 07/06/2023   HGB 14.1 07/06/2023   HCT 42.5 07/06/2023   MCV 97.1 07/06/2023   MCH 31.5 05/27/2022   RDW 13.4  07/06/2023   PLT 282.0 07/06/2023   Last metabolic panel Lab Results  Component Value Date   GLUCOSE 103 (H) 07/06/2023   NA 138 07/06/2023   K 4.1 07/06/2023   CL 99 07/06/2023   CO2 30 07/06/2023   BUN 13 07/06/2023   CREATININE 0.67 07/06/2023   GFR 98.19 07/06/2023   CALCIUM 10.1 07/06/2023   PROT 7.0 07/06/2023   ALBUMIN 4.6 07/06/2023   BILITOT 0.9 07/06/2023   ALKPHOS 48 07/06/2023   AST 18 07/06/2023   ALT 24 07/06/2023   ANIONGAP 8 05/27/2022   Last lipids Lab Results  Component Value Date   CHOL 134 02/09/2023   HDL 54.30 02/09/2023   LDLCALC 65 02/09/2023   LDLDIRECT 148.1 01/20/2008   TRIG 71.0 02/09/2023   CHOLHDL 2 02/09/2023   Last hemoglobin A1c Lab Results  Component Value  Date   HGBA1C 5.6 02/09/2023   Last thyroid functions Lab Results  Component Value Date   TSH 0.74 02/05/2022   Last vitamin D Lab Results  Component Value Date   VD25OH 46.12 08/07/2022   Last vitamin B12 and Folate Lab Results  Component Value Date   VITAMINB12 731 12/28/2019   FOLATE >24.8 12/28/2019      The 10-year ASCVD risk score (Arnett DK, et al., 2019) is: 3.6%    Assessment & Plan:   Problem List Items Addressed This Visit       Unprioritized   Generalized anxiety disorder   Relevant Medications   ALPRAZolam (XANAX) 0.25 MG tablet   Dyspepsia   Relevant Medications   ondansetron (ZOFRAN) 4 MG tablet   Anaphylactic syndrome   Relevant Medications   EPINEPHrine 0.3 mg/0.3 mL IJ SOAJ injection   Essential hypertension   Relevant Medications   atenolol (TENORMIN) 100 MG tablet   atorvastatin (LIPITOR) 10 MG tablet   EPINEPHrine 0.3 mg/0.3 mL IJ SOAJ injection   hydrochlorothiazide (HYDRODIURIL) 25 MG tablet   Other Relevant Orders   CBC with Differential/Platelet   Comprehensive metabolic panel   Nausea and vomiting   Relevant Medications   ondansetron (ZOFRAN) 4 MG tablet   Other Visit Diagnoses       Need for pneumococcal 20-valent  conjugate vaccination    -  Primary   Relevant Orders   Pneumococcal conjugate vaccine 20-valent (Prevnar 20) (Completed)     Hyperlipidemia LDL goal <100       Relevant Medications   atenolol (TENORMIN) 100 MG tablet   atorvastatin (LIPITOR) 10 MG tablet   EPINEPHrine 0.3 mg/0.3 mL IJ SOAJ injection   hydrochlorothiazide (HYDRODIURIL) 25 MG tablet   Other Relevant Orders   Lipid panel   CBC with Differential/Platelet   Comprehensive metabolic panel     Encounter for other general counseling or advice on contraception         Type 2 diabetes mellitus with hyperglycemia, without long-term current use of insulin (HCC)       Relevant Medications   atorvastatin (LIPITOR) 10 MG tablet   Semaglutide, 2 MG/DOSE, 8 MG/3ML SOPN   Other Relevant Orders   Comprehensive metabolic panel   Hemoglobin A1c   Microalbumin / creatinine urine ratio     Estrogen deficiency       Relevant Orders   DG Bone Density     Assessment and Plan    Diabetes Mellitus   Diabetes is managed with metformin, and there is a surplus, so no refill is needed. A pneumonia vaccine is recommended due to her diabetes and age to prevent complications. Administer the pneumonia vaccine.  Hypertension   Hypertension is managed with atenolol and amlodipine. A recent refill of amlodipine means it is not needed at this time. Refill atenolol.  Medication Management   She is managing multiple medications for hypertension, diabetes, and muscle spasms, with concerns about medication burden and coordination with her rheumatologist. Currently, she takes Xanax, atenolol, atorvastatin, clobetasol, epinephrine, hydrochlorothiazide, metformin, and methocarbamol. There is a surplus of metformin, so no refill is needed. Zofran is not needed and should be discontinued. Considering discontinuing Depo-Provera due to concerns about long-term effects and being post-menopausal. Discussed potential risks of Depo-Provera, including brain tumors and  osteoporosis, especially given her bone health concerns. Refill Xanax, atenolol, atorvastatin, clobetasol, epinephrine, hydrochlorothiazide, and methocarbamol. Discuss discontinuation of Depo-Provera.  Osteoporosis Risk   There is concern about osteoporosis, especially with long-term Depo-Provera  use. She is post-menopausal and advised to discontinue Depo-Provera. The importance of bone density monitoring was discussed, and a bone density test is ordered to assess her current bone health status.  General Health Maintenance   She is not engaging in regular exercise and acknowledges the need to increase physical activity. The importance of vaccinations, specifically the pneumonia vaccine, was discussed due to her age and diabetes. She is considering finding a new dentist due to insurance issues. Encourage regular exercise, administer the pneumonia vaccine, and advise finding a new dentist in-network.  Follow-up   An upcoming appointment with the rheumatologist is scheduled for April 7th. Plan to follow up on medication management and bone health after the rheumatology appointment.        No follow-ups on file.    Donato Schultz, DO

## 2023-08-21 ENCOUNTER — Encounter: Payer: Self-pay | Admitting: Family Medicine

## 2023-09-08 ENCOUNTER — Ambulatory Visit

## 2023-09-08 DIAGNOSIS — N959 Unspecified menopausal and perimenopausal disorder: Secondary | ICD-10-CM | POA: Diagnosis not present

## 2023-09-08 DIAGNOSIS — E2839 Other primary ovarian failure: Secondary | ICD-10-CM

## 2023-09-08 DIAGNOSIS — Z13820 Encounter for screening for osteoporosis: Secondary | ICD-10-CM

## 2023-09-13 ENCOUNTER — Encounter: Payer: Self-pay | Admitting: Family Medicine

## 2023-09-17 ENCOUNTER — Encounter: Payer: Self-pay | Admitting: Family Medicine

## 2023-09-28 DIAGNOSIS — L409 Psoriasis, unspecified: Secondary | ICD-10-CM | POA: Diagnosis not present

## 2023-09-28 DIAGNOSIS — M25562 Pain in left knee: Secondary | ICD-10-CM | POA: Diagnosis not present

## 2023-09-28 DIAGNOSIS — M25561 Pain in right knee: Secondary | ICD-10-CM | POA: Diagnosis not present

## 2023-09-29 NOTE — Telephone Encounter (Signed)
 Patient ct scan is able to be read now

## 2023-10-06 ENCOUNTER — Telehealth: Payer: Self-pay

## 2023-10-06 NOTE — Telephone Encounter (Signed)
 Copied from CRM [id number]. Topic: General - Other >> Oct 06, 2023  2:48 PM Chuck Crater wrote: Reason for CRM: Cathleen Coach with [facility] stated that patient had a bone density test done and it was incorrect so radiologist did an addendum for Osteopenia not normal but Patient states that Mychart has it in there as normal. She wants Dr. Jalene Mayor to add in there that it is Osteopenia not normal.

## 2023-10-08 ENCOUNTER — Ambulatory Visit: Admitting: Family Medicine

## 2023-10-20 DIAGNOSIS — M25562 Pain in left knee: Secondary | ICD-10-CM | POA: Diagnosis not present

## 2023-10-20 DIAGNOSIS — L409 Psoriasis, unspecified: Secondary | ICD-10-CM | POA: Diagnosis not present

## 2023-10-20 DIAGNOSIS — M1991 Primary osteoarthritis, unspecified site: Secondary | ICD-10-CM | POA: Diagnosis not present

## 2023-10-20 DIAGNOSIS — M25561 Pain in right knee: Secondary | ICD-10-CM | POA: Diagnosis not present

## 2023-11-04 ENCOUNTER — Ambulatory Visit: Admitting: Family Medicine

## 2023-11-04 VITALS — BP 115/61 | HR 73 | Wt 175.1 lb

## 2023-11-04 DIAGNOSIS — N904 Leukoplakia of vulva: Secondary | ICD-10-CM | POA: Diagnosis not present

## 2023-11-05 NOTE — Progress Notes (Signed)
   Subjective:    Patient ID: [name] Ramos, female    DOB: [birth year], 58 y.o.   MRN: [id number]  HPI  Patient seen for bump on labia. Bump is located on the left upper vulva. She thinks that it has been there for a little while, but just noticed it. It is uncomfortable. She does have lichen sclerosis and has had a couple of biopsies in the past. She has been prescribed clobetasol  but doesn't use it frequently because it "makes her angry". Instead, she uses hydrocortisone cream which helps to reduce the itchiness and tries to use the clobetasol  at night. She has tried to use the clobetasol  during the day recently and noted that it did not affect her mood at all.  Review of Systems     Objective:   Physical Exam Vitals reviewed.  Constitutional:      Appearance: Normal appearance.  Genitourinary:      Comments: Atrophied vaginal mucosa at the introitus. Mild Lichen slerosis on the labia. Neurological:     Mental Status: She is alert.  Psychiatric:        Mood and Affect: Mood normal.        Behavior: Behavior normal.        Thought Content: Thought content normal.        Judgment: Judgment normal.        Assessment & Plan:  1. Lichen sclerosus of female genitalia (Primary) I believe that the area in question is lichen sclerosis with scarring. I recommended being more consistent with steroid cream for the next few weeks, then return for reevaluation. We discussed options for treatment: Try taking the clobetasol  in the morning to see if this helps with the lichen slerosis, but not affect her mood. I stressed that consistent use of the steroid cream would lead to improved results. If changing the timing of the clobetasol  still affects her mood, then we could trial a different high potency steroid such as betamethasone or halobetasol to see if these would be more affective. If the steroid cream isn't effective of the area in question, then may need to proceed with biopsy and/or  intralesion steroid injection We did discuss other options is not able to tolerate steroid creams, such as topical tacrolimis or methotrexate. In general, these medicines are outside of the scope of most OB/Gyns. The patient has a dermatologist, but has never discussed the lichen with them. Will place a referral to discuss whether there is anything they have to offer above the topical steroids.  18 minutes spent with patient for exam, discussing options.

## 2023-11-16 DIAGNOSIS — Z79899 Other long term (current) drug therapy: Secondary | ICD-10-CM | POA: Diagnosis not present

## 2023-11-16 DIAGNOSIS — L900 Lichen sclerosus et atrophicus: Secondary | ICD-10-CM | POA: Diagnosis not present

## 2023-11-16 DIAGNOSIS — L308 Other specified dermatitis: Secondary | ICD-10-CM | POA: Diagnosis not present

## 2023-11-24 ENCOUNTER — Ambulatory Visit: Admitting: Obstetrics & Gynecology

## 2023-12-25 ENCOUNTER — Other Ambulatory Visit: Payer: Self-pay | Admitting: Family Medicine

## 2023-12-25 DIAGNOSIS — E1165 Type 2 diabetes mellitus with hyperglycemia: Secondary | ICD-10-CM

## 2024-01-08 ENCOUNTER — Other Ambulatory Visit: Payer: Self-pay | Admitting: Family Medicine

## 2024-01-08 DIAGNOSIS — I10 Essential (primary) hypertension: Secondary | ICD-10-CM

## 2024-01-08 DIAGNOSIS — K219 Gastro-esophageal reflux disease without esophagitis: Secondary | ICD-10-CM

## 2024-01-26 DIAGNOSIS — M542 Cervicalgia: Secondary | ICD-10-CM | POA: Diagnosis not present

## 2024-02-02 ENCOUNTER — Other Ambulatory Visit (HOSPITAL_COMMUNITY): Payer: Self-pay

## 2024-02-02 ENCOUNTER — Telehealth: Payer: Self-pay

## 2024-02-02 NOTE — Telephone Encounter (Signed)
 Pharmacy Patient Advocate Encounter   Received notification from CoverMyMeds that PRIOR AUTHORIZATION for Ozempic  (2 MG/DOSE) 8MG /3ML pen-injectors    Insurance verification completed.   The patient is insured through [facility].  Action: Refill too soon. PA is not needed at this time. Medication was filled 01/22/2024. Next eligible fill date is 02/12/2024.

## 2024-02-09 ENCOUNTER — Other Ambulatory Visit: Payer: Self-pay | Admitting: Family Medicine

## 2024-02-09 DIAGNOSIS — Z1231 Encounter for screening mammogram for malignant neoplasm of breast: Secondary | ICD-10-CM

## 2024-02-10 DIAGNOSIS — M542 Cervicalgia: Secondary | ICD-10-CM | POA: Diagnosis not present

## 2024-02-19 ENCOUNTER — Other Ambulatory Visit: Payer: Self-pay | Admitting: Family Medicine

## 2024-02-19 DIAGNOSIS — E1165 Type 2 diabetes mellitus with hyperglycemia: Secondary | ICD-10-CM

## 2024-03-07 ENCOUNTER — Other Ambulatory Visit: Payer: Self-pay | Admitting: Family Medicine

## 2024-03-07 ENCOUNTER — Encounter: Payer: Self-pay | Admitting: Family Medicine

## 2024-03-07 DIAGNOSIS — L710 Perioral dermatitis: Secondary | ICD-10-CM

## 2024-03-07 MED ORDER — TETRACYCLINE HCL 250 MG PO CAPS
250.0000 mg | ORAL_CAPSULE | Freq: Every day | ORAL | 1 refills | Status: AC
Start: 2024-03-07 — End: ?

## 2024-03-10 ENCOUNTER — Ambulatory Visit: Admitting: Family Medicine

## 2024-03-10 ENCOUNTER — Encounter: Payer: Self-pay | Admitting: Family Medicine

## 2024-03-10 VITALS — BP 112/80 | HR 69 | Temp 98.0°F | Resp 16 | Ht 65.0 in | Wt 175.2 lb

## 2024-03-10 DIAGNOSIS — Z7985 Long-term (current) use of injectable non-insulin antidiabetic drugs: Secondary | ICD-10-CM | POA: Diagnosis not present

## 2024-03-10 DIAGNOSIS — E1165 Type 2 diabetes mellitus with hyperglycemia: Secondary | ICD-10-CM

## 2024-03-10 DIAGNOSIS — M8580 Other specified disorders of bone density and structure, unspecified site: Secondary | ICD-10-CM

## 2024-03-10 MED ORDER — TIRZEPATIDE 5 MG/0.5ML ~~LOC~~ SOAJ: 5.0000 mg | SUBCUTANEOUS | 1 refills | Status: AC

## 2024-03-10 NOTE — Progress Notes (Signed)
 Subjective:    Patient ID: [name] Ramos, female    DOB: 1966-05-29, 58 y.o.   MRN: [id number]  Chief Complaint  Patient presents with   Referral    Pt requesting referral to Endo    HPI Patient is in today for referral to endo.  Discussed the use of AI scribe software for clinical note transcription with the patient, who gave verbal consent to proceed.  History of Present Illness [name] Ramos is a 58 year old female who presents for endocrinology consultation regarding autoimmune conditions and osteopenia.  She is concerned about autoimmune conditions and osteopenia. She frequently hears about autoimmune issues being related to her health problems and is interested in managing her osteopenia, which she describes as not severe enough for osteoporosis treatment but is considering further evaluation.  She experiences significant sugar cravings despite her diabetes being well-controlled. She is on the highest dose of Ozempic  for diabetes management. She is concerned about weight gain and has been maintaining her weight at 166 pounds. She fears gaining weight again and has stopped eating to prevent weight gain.  Her family history includes her mother having osteoporosis and diabetic complications, including vision loss. She is proactive in her health management to avoid similar outcomes.  She reports persistent neck pain, which was evaluated by a surgeon and an ENT. The ENT attributed the pain to scar tissue from a previous surgery causing muscle tension and pulling on her hyoid bone. She describes the condition as a 'pain in the ass' and notes that there is no treatment available other than managing symptoms by holding her throat when yawning.    Past Medical History:  Diagnosis Date   Alcohol abuse    recovering alcoholic   Allergic urticaria    Anxiety    Cholecystitis, acute    Pt denies   Congenital anomaly of neck    Depression    Diabetes mellitus without complication  (HCC)    Dyslipidemia    Followed by PCP   Gastroparesis    GERD (gastroesophageal reflux disease)    Hematuria    Hiatal hernia    with reflux   Hypertension    stress test 11/2010 normal   Kidney stone    Left bundle branch block (LBBB)    Obesity    PVC's (premature ventricular contractions)    Controlled on beta blocker therapy;  Echo 9/10: EF 55-60%, grade 1 diastolic dysfunction, mild MR, mild LAE   Thyroid  nodule    hx   Tinea corporis    Unspecified contraceptive management    Urinary tract infection    Vaginal Pap smear, abnormal     Past Surgical History:  Procedure Laterality Date   ANTERIOR CERVICAL DECOMP/DISCECTOMY FUSION N/A 06/04/2022   Procedure: ANTERIOR CERVICAL DECOMPRESSION/DISCECTOMY FUSION 3 LEVELS C4-7;  Surgeon: Burnetta Aures, MD;  Location: [facility] OR;  Service: Orthopedics;  Laterality: N/A;  3.5 hrs 3 C-Bed   leep surgery     mid 20's   TONSILLECTOMY      Family History  Problem Relation Age of Onset   Diabetes Mother    Liver disease Mother    Irritable bowel syndrome Mother    Colon polyps Mother    Thyroid  disease Mother    Hypertension Mother    Breast cancer Mother 70   Hyperlipidemia Father    Hypertension Father    Lung cancer Maternal Uncle    Lung cancer Maternal Grandmother    Heart attack Maternal Grandmother 44  Diabetes Maternal Grandmother    Lung cancer Paternal Grandmother    Coronary artery disease Other        female 1st degree relative 56 yo   Hypertension Other    Colon cancer Neg Hx    Stomach cancer Neg Hx     Social History   Socioeconomic History   Marital status: Single    Spouse name: Not on file   Number of children: Not on file   Years of education: Not on file   Highest education level: Not on file  Occupational History   Occupation: post office    Employer: [employer]  Tobacco Use   Smoking status: Former    Current packs/day: 0.00    Types: Cigarettes    Quit date: 06/16/2018    Years since  quitting: 5.7   Smokeless tobacco: Never  Vaping Use   Vaping status: Never Used  Substance and Sexual Activity   Alcohol use: Yes    Comment: social drinker, ocassional   Drug use: No   Sexual activity: Not Currently    Partners: Male  Other Topics Concern   Not on file  Social History Narrative   Exercising--no   Social Drivers of Health   Financial Resource Strain: Low Risk  (05/18/2023)   Overall Financial Resource Strain (CARDIA)    Difficulty of Paying Living Expenses: Not hard at all  Food Insecurity: No Food Insecurity (05/18/2023)   Hunger Vital Sign    Worried About Running Out of Food in the Last Year: Never true    Ran Out of Food in the Last Year: Never true  Transportation Needs: Not on file  Physical Activity: Sufficiently Active (05/18/2023)   Exercise Vital Sign    Days of Exercise per Week: 5 days    Minutes of Exercise per Session: 120 min  Stress: Stress Concern Present (05/18/2023)   [employer] of Occupational Health - Occupational Stress Questionnaire    Feeling of Stress : To some extent  Social Connections: Socially Isolated (05/18/2023)   Social Connection and Isolation Panel    Frequency of Communication with Friends and Family: Once a week    Frequency of Social Gatherings with Friends and Family: Once a week    Attends Religious Services: Never    Database administrator or Organizations: No    Attends Engineer, structural: Not on file    Marital Status: Never married  Intimate Partner Violence: Not on file    Outpatient Medications Prior to Visit  Medication Sig Dispense Refill   albuterol  (VENTOLIN  HFA) 108 (90 Base) MCG/ACT inhaler Inhale 2 puffs into the lungs every 6 (six) hours as needed for wheezing or shortness of breath. 8 g 2   ALPRAZolam  (XANAX ) 0.25 MG tablet Take 1 tablet (0.25 mg total) by mouth 3 (three) times daily as needed for anxiety. 30 tablet 1   amLODipine  (NORVASC ) 5 MG tablet TAKE 1 TABLET (5 MG TOTAL)  BY MOUTH DAILY. 90 tablet 1   aspirin  EC 81 MG tablet Take 1 tablet by mouth daily.     atenolol  (TENORMIN ) 100 MG tablet Take 1 tablet (100 mg total) by mouth daily. 90 tablet 3   atorvastatin  (LIPITOR) 10 MG tablet Take 1 tablet (10 mg total) by mouth daily. 90 tablet 3   blood glucose meter kit and supplies Dispense based on patient and insurance preference. Use up to four times daily as directed. (FOR ICD-10 E10.9, E11.9). 1 each 0  CELEBREX 200 MG capsule as needed for moderate pain (pain score 4-6).     clobetasol  cream (TEMOVATE ) 0.05 % Apply 1 Application topically 2 (two) times daily. 30 g 2   EPINEPHrine  0.3 mg/0.3 mL IJ SOAJ injection Inject 0.3 mg into the muscle as needed for anaphylaxis. 2 each 1   hydrochlorothiazide  (HYDRODIURIL ) 25 MG tablet Take 1 tablet (25 mg total) by mouth daily. 90 tablet 1   metFORMIN  (GLUCOPHAGE ) 1000 MG tablet TAKE 1 TABLET (1,000 MG TOTAL) BY MOUTH TWICE A DAY WITH FOOD 180 tablet 1   methocarbamol  (ROBAXIN ) 500 MG tablet Take 1 tablet (500 mg total) by mouth every 8 (eight) hours as needed for muscle spasms. 45 tablet 1   omeprazole  (PRILOSEC) 40 MG capsule TAKE 1 CAPSULE BY MOUTH EVERY DAY 90 capsule 1   tetracycline (SUMYCIN) 250 MG capsule Take 1 capsule (250 mg total) by mouth daily. 30 capsule 1   Semaglutide , 2 MG/DOSE, (OZEMPIC , 2 MG/DOSE,) 8 MG/3ML SOPN INJECT 2 MG INTO THE SKIN ONCE A WEEK. 9 mL 1   ondansetron  (ZOFRAN ) 4 MG tablet Take 1 tablet (4 mg total) by mouth every 8 (eight) hours as needed for nausea or vomiting. (Patient not taking: Reported on 03/10/2024) 20 tablet 0   No facility-administered medications prior to visit.    Allergies  Allergen Reactions   Ace Inhibitors Other (See Comments)    Numbness, tingling of lips.   Bee Venom Swelling    Leg swelling   Lisinopril      Other reaction(s): Other (See Comments) other   Piroxicam Hives   Wasp Venom     Review of Systems  Constitutional:  Negative for chills, fever and  malaise/fatigue.  HENT:  Negative for congestion and hearing loss.   Eyes:  Negative for blurred vision and discharge.  Respiratory:  Negative for cough, sputum production and shortness of breath.   Cardiovascular:  Negative for chest pain, palpitations and leg swelling.  Gastrointestinal:  Negative for abdominal pain, blood in stool, constipation, diarrhea, heartburn, nausea and vomiting.  Genitourinary:  Negative for dysuria, frequency, hematuria and urgency.  Musculoskeletal:  Negative for back pain, falls and myalgias.  Skin:  Negative for rash.  Neurological:  Negative for dizziness, sensory change, loss of consciousness, weakness and headaches.  Endo/Heme/Allergies:  Negative for environmental allergies. Does not bruise/bleed easily.  Psychiatric/Behavioral:  Negative for depression and suicidal ideas. The patient is not nervous/anxious and does not have insomnia.        Objective:    Physical Exam Vitals and nursing note reviewed.  Constitutional:      General: She is not in acute distress.    Appearance: Normal appearance. She is well-developed.  HENT:     Head: Normocephalic and atraumatic.  Eyes:     General: No scleral icterus.       Right eye: No discharge.        Left eye: No discharge.  Cardiovascular:     Rate and Rhythm: Normal rate and regular rhythm.     Heart sounds: No murmur heard. Pulmonary:     Effort: Pulmonary effort is normal. No respiratory distress.     Breath sounds: Normal breath sounds.  Musculoskeletal:        General: Normal range of motion.     Cervical back: Normal range of motion and neck supple.     Right lower leg: No edema.     Left lower leg: No edema.  Skin:    General:  Skin is warm and dry.  Neurological:     Mental Status: She is alert and oriented to person, place, and time.  Psychiatric:        Mood and Affect: Mood normal.        Behavior: Behavior normal.        Thought Content: Thought content normal.        Judgment:  Judgment normal.     BP 112/80 (BP Location: Left Arm, Patient Position: Sitting, Cuff Size: Large)   Pulse 69   Temp 98 F (36.7 C) (Oral)   Resp 16   Ht 5' 5 (1.651 m)   Wt 175 lb 3.2 oz (79.5 kg)   SpO2 98%   BMI 29.15 kg/m  Wt Readings from Last 3 Encounters:  03/10/24 175 lb 3.2 oz (79.5 kg)  11/04/23 175 lb 1.9 oz (79.4 kg)  08/19/23 173 lb 12.8 oz (78.8 kg)    Diabetic Foot Exam - Simple   No data filed    Lab Results  Component Value Date   WBC 5.9 08/19/2023   HGB 14.0 08/19/2023   HCT 41.2 08/19/2023   PLT 266.0 08/19/2023   GLUCOSE 89 08/19/2023   CHOL 156 08/19/2023   TRIG 76.0 08/19/2023   HDL 69.00 08/19/2023   LDLDIRECT 148.1 01/20/2008   LDLCALC 71 08/19/2023   ALT 21 08/19/2023   AST 20 08/19/2023   NA 139 08/19/2023   K 4.3 08/19/2023   CL 103 08/19/2023   CREATININE 0.62 08/19/2023   BUN 10 08/19/2023   CO2 27 08/19/2023   TSH 0.74 02/05/2022   HGBA1C 5.4 08/19/2023   MICROALBUR <0.7 08/19/2023    Lab Results  Component Value Date   TSH 0.74 02/05/2022   Lab Results  Component Value Date   WBC 5.9 08/19/2023   HGB 14.0 08/19/2023   HCT 41.2 08/19/2023   MCV 97.1 08/19/2023   PLT 266.0 08/19/2023   Lab Results  Component Value Date   NA 139 08/19/2023   K 4.3 08/19/2023   CO2 27 08/19/2023   GLUCOSE 89 08/19/2023   BUN 10 08/19/2023   CREATININE 0.62 08/19/2023   BILITOT 0.6 08/19/2023   ALKPHOS 50 08/19/2023   AST 20 08/19/2023   ALT 21 08/19/2023   PROT 7.0 08/19/2023   ALBUMIN 4.7 08/19/2023   CALCIUM  10.1 08/19/2023   ANIONGAP 8 05/27/2022   GFR 99.96 08/19/2023   Lab Results  Component Value Date   CHOL 156 08/19/2023   Lab Results  Component Value Date   HDL 69.00 08/19/2023   Lab Results  Component Value Date   LDLCALC 71 08/19/2023   Lab Results  Component Value Date   TRIG 76.0 08/19/2023   Lab Results  Component Value Date   CHOLHDL 2 08/19/2023   Lab Results  Component Value Date    HGBA1C 5.4 08/19/2023       Assessment & Plan:  Type 2 diabetes mellitus with hyperglycemia, without long-term current use of insulin  (HCC) -     Tirzepatide ; Inject 5 mg into the skin once a week.  Dispense: 6 mL; Refill: 1 -     Ambulatory referral to Endocrinology   Assessment and Plan Assessment & Plan Type 2 diabetes mellitus   Her type 2 diabetes mellitus is well-controlled, but she reports increased sugar cravings. She is currently on the highest dose of Ozempic . Mounjaro  may be more effective and associated with more weight loss. Switch from Ozempic  to Mounjaro  at  a 5 mg dose. Initiate prior authorization for Mounjaro . Monitor weight and adjust Mounjaro  dosage monthly as needed.  Osteopenia   Osteopenia is present but not severe enough for pharmacological intervention. She is interested in an endocrinologist consultation, typically for more severe cases like osteoporosis. Encourage weight-bearing exercise, calcium , and vitamin D  supplementation. Referral to an endocrinologist for further evaluation.   [name] Ramos R Lowne Chase, DO

## 2024-03-14 ENCOUNTER — Other Ambulatory Visit (HOSPITAL_COMMUNITY): Payer: Self-pay

## 2024-03-14 ENCOUNTER — Telehealth: Payer: Self-pay | Admitting: Pharmacy Technician

## 2024-03-14 NOTE — Telephone Encounter (Signed)
 Pharmacy Patient Advocate Encounter   Received notification from CoverMyMeds that prior authorization for Mounjaro  5MG /0.5ML auto-injectors  is required/requested.   Insurance verification completed.   The patient is insured through [facility].   Per test claim: PA required and submitted KEY/EOC/Request #: [id number] APPROVED from 02/13/24 to 03/14/25. Ran test claim, Copay is $25.00. This test claim was processed through [facility]- copay amounts may vary at other pharmacies due to pharmacy/plan contracts, or as the patient moves through the different stages of their insurance plan.

## 2024-03-29 DIAGNOSIS — E559 Vitamin D deficiency, unspecified: Secondary | ICD-10-CM | POA: Diagnosis not present

## 2024-03-29 DIAGNOSIS — E042 Nontoxic multinodular goiter: Secondary | ICD-10-CM | POA: Diagnosis not present

## 2024-03-29 DIAGNOSIS — I10 Essential (primary) hypertension: Secondary | ICD-10-CM | POA: Diagnosis not present

## 2024-03-29 DIAGNOSIS — E1169 Type 2 diabetes mellitus with other specified complication: Secondary | ICD-10-CM | POA: Diagnosis not present

## 2024-03-29 DIAGNOSIS — Z87442 Personal history of urinary calculi: Secondary | ICD-10-CM | POA: Diagnosis not present

## 2024-03-29 DIAGNOSIS — E119 Type 2 diabetes mellitus without complications: Secondary | ICD-10-CM | POA: Diagnosis not present

## 2024-03-29 DIAGNOSIS — M8580 Other specified disorders of bone density and structure, unspecified site: Secondary | ICD-10-CM | POA: Diagnosis not present

## 2024-03-30 ENCOUNTER — Telehealth: Payer: Self-pay | Admitting: Internal Medicine

## 2024-03-30 NOTE — Telephone Encounter (Signed)
 Returned patient's call regarding atenolol . Patient reports she was recently changed from ozempic  to mounjaro  on 03/26/24 because she started craving sweets. She was then referred to endocrinology and had her first visit with Dr. Odella Jacobson at [facility] yesterday 03/29/24. Patient reports Dr. Jacobson told her that the atenolol  might be making her insulin  resistant and recommended she talk to Dr. Santo about changing this medication. Unfortunately, there are no notes in the chart from that visit with Dr. Jacobson.  Patient reports her blood sugars are well controlled with metformin  and ozempic /mounjaro , usually around 115. Her last A1c in March 2025 was 5.4. Her BP yesterday was 140/80 and HR today is 77. Called Dr. Starlyn office and requested notes from visit yesterday, forwarded to Dr. Santo.

## 2024-03-30 NOTE — Telephone Encounter (Signed)
 Pt c/o medication issue:  1. Name of Medication: atenolol  (TENORMIN ) 100 MG tablet   2. How are you currently taking this medication (dosage and times per day)? As written  3. Are you having a reaction (difficulty breathing--STAT)? No  4. What is your medication issue? Pt states that per Endocrinologist medication is making her insulin  resistant. Pt would like a c/b regarding this matter. Please advise

## 2024-04-04 ENCOUNTER — Ambulatory Visit (HOSPITAL_COMMUNITY)
Admission: RE | Admit: 2024-04-04 | Discharge: 2024-04-04 | Disposition: A | Discharge status: Home / Self Care | Payer: Federal, State, Local not specified - PPO | Source: Ambulatory Visit | Attending: Cardiology | Admitting: Cardiology

## 2024-04-04 DIAGNOSIS — E785 Hyperlipidemia, unspecified: Secondary | ICD-10-CM

## 2024-04-04 DIAGNOSIS — E1169 Type 2 diabetes mellitus with other specified complication: Secondary | ICD-10-CM | POA: Diagnosis not present

## 2024-04-04 DIAGNOSIS — I447 Left bundle-branch block, unspecified: Secondary | ICD-10-CM | POA: Diagnosis not present

## 2024-04-04 DIAGNOSIS — I493 Ventricular premature depolarization: Secondary | ICD-10-CM | POA: Diagnosis not present

## 2024-04-04 DIAGNOSIS — I340 Nonrheumatic mitral (valve) insufficiency: Secondary | ICD-10-CM

## 2024-04-04 DIAGNOSIS — I10 Essential (primary) hypertension: Secondary | ICD-10-CM | POA: Diagnosis not present

## 2024-04-04 LAB — ECHOCARDIOGRAM COMPLETE
Area-P 1/2: 4.41 cm2
S' Lateral: 3.7 cm

## 2024-04-05 ENCOUNTER — Ambulatory Visit: Payer: Self-pay | Admitting: Internal Medicine

## 2024-04-07 ENCOUNTER — Other Ambulatory Visit: Payer: Self-pay | Admitting: Family Medicine

## 2024-04-07 DIAGNOSIS — I10 Essential (primary) hypertension: Secondary | ICD-10-CM

## 2024-05-02 ENCOUNTER — Ambulatory Visit: Attending: Internal Medicine | Admitting: Internal Medicine

## 2024-05-02 VITALS — BP 134/82 | HR 71 | Ht 65.0 in | Wt 182.0 lb

## 2024-05-02 DIAGNOSIS — I10 Essential (primary) hypertension: Secondary | ICD-10-CM

## 2024-05-02 DIAGNOSIS — E1169 Type 2 diabetes mellitus with other specified complication: Secondary | ICD-10-CM | POA: Diagnosis not present

## 2024-05-02 DIAGNOSIS — I739 Peripheral vascular disease, unspecified: Secondary | ICD-10-CM

## 2024-05-02 DIAGNOSIS — I493 Ventricular premature depolarization: Secondary | ICD-10-CM | POA: Diagnosis not present

## 2024-05-02 DIAGNOSIS — E785 Hyperlipidemia, unspecified: Secondary | ICD-10-CM

## 2024-05-02 DIAGNOSIS — I447 Left bundle-branch block, unspecified: Secondary | ICD-10-CM | POA: Diagnosis not present

## 2024-05-02 DIAGNOSIS — I340 Nonrheumatic mitral (valve) insufficiency: Secondary | ICD-10-CM

## 2024-05-02 MED ORDER — CARVEDILOL 25 MG PO TABS: 25.0000 mg | ORAL_TABLET | Freq: Two times a day (BID) | ORAL | 3 refills | Status: AC

## 2024-05-02 NOTE — Progress Notes (Signed)
 Cardiology Office Note:    Date:  05/02/2024   ID:  Avis Tirone, DOB Apr 05, 1966, MRN [id number]  PCP:  Antonio Meth, Jamee SAUNDERS, DO   [facility] Providers Cardiologist:  Stanly DELENA Leavens, MD     Referring MD: Antonio Meth, Jamee SAUNDERS, *   CC:  PVC f/u   History of Present Illness:    [name] Ramos is a 58 y.o. female with a hx of LBBB, Fhx of CAD, PVCs well known to Dr. Claudene.  2023: saw me for DOD- transitioned to be due to Dr. Claudene retiring.   2024: PVCs   Discussed the use of AI scribe software for clinical note transcription with the patient, who gave verbal consent to proceed.  [name] Ramos  is a 58 year old female with premature atrial contractions, premature ventricular contractions, and mild mitral regurgitation who presents for cardiovascular follow-up.  She experiences persistent premature atrial contractions (PACs) and premature ventricular contractions (PVCs), particularly during activities that elevate her heart rate, such as using the elliptical or working in the yard. She has been on atenolol  100 mg for a long time, which effectively manages her symptoms by controlling her heart rate.  She has mild mitral regurgitation and underwent an echocardiogram on April 04, 2024. Her blood pressure and cholesterol levels have been checked and are within the desired range.  She has a history of morbid obesity and has been successful in managing her weight with Ozempic . She is also on metformin  2000 mg for insulin  resistance, which may be exacerbated by her use of atenolol .  Her past medical history includes peripheral arterial disease and an incomplete left bundle branch block, previously assessed by another cardiologist.  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  Past Medical History:  Diagnosis Date   Alcohol abuse    recovering alcoholic   Allergic urticaria    Anxiety    Cholecystitis,  acute    Pt denies   Congenital anomaly of neck    Depression    Diabetes mellitus without complication (HCC)    Dyslipidemia    Followed by PCP   Gastroparesis    GERD (gastroesophageal reflux disease)    Hematuria    Hiatal hernia    with reflux   Hypertension    stress test 11/2010 normal   Kidney stone    Left bundle branch block (LBBB)    Obesity    PVC's (premature ventricular contractions)    Controlled on beta blocker therapy;  Echo 9/10: EF 55-60%, grade 1 diastolic dysfunction, mild MR, mild LAE   Thyroid  nodule    hx   Tinea corporis    Unspecified contraceptive management    Urinary tract infection    Vaginal Pap smear, abnormal     Past Surgical History:  Procedure Laterality Date   ANTERIOR CERVICAL DECOMP/DISCECTOMY FUSION N/A 06/04/2022   Procedure: ANTERIOR CERVICAL DECOMPRESSION/DISCECTOMY FUSION 3 LEVELS C4-7;  Surgeon: Burnetta Aures, MD;  Location: [facility] OR;  Service: Orthopedics;  Laterality: N/A;  3.5 hrs 3 C-Bed   leep surgery     mid 20's   TONSILLECTOMY      Current Medications: Current Meds  Medication Sig   albuterol  (VENTOLIN  HFA) 108 (90 Base) MCG/ACT inhaler Inhale 2 puffs into the lungs every 6 (six) hours as needed for wheezing or shortness of breath.   ALPRAZolam  (XANAX ) 0.25 MG tablet Take 1 tablet (0.25 mg total) by mouth 3 (three) times  daily as needed for anxiety.   amLODipine  (NORVASC ) 5 MG tablet TAKE 1 TABLET (5 MG TOTAL) BY MOUTH DAILY.   aspirin  EC 81 MG tablet Take 1 tablet by mouth daily.   atorvastatin  (LIPITOR) 10 MG tablet Take 1 tablet (10 mg total) by mouth daily.   blood glucose meter kit and supplies Dispense based on patient and insurance preference. Use up to four times daily as directed. (FOR ICD-10 E10.9, E11.9).   carvedilol  (COREG ) 25 MG tablet Take 1 tablet (25 mg total) by mouth 2 (two) times daily.   CELEBREX 200 MG capsule as needed for moderate pain (pain score 4-6).   clobetasol  cream (TEMOVATE ) 0.05 % Apply 1  Application topically 2 (two) times daily.   EPINEPHrine  0.3 mg/0.3 mL IJ SOAJ injection Inject 0.3 mg into the muscle as needed for anaphylaxis.   hydrochlorothiazide  (HYDRODIURIL ) 25 MG tablet Take 1 tablet (25 mg total) by mouth daily.   medroxyPROGESTERone  Acetate 150 MG/ML SUSY every 3 (three) months.   metFORMIN  (GLUCOPHAGE ) 1000 MG tablet TAKE 1 TABLET (1,000 MG TOTAL) BY MOUTH TWICE A DAY WITH FOOD   methocarbamol  (ROBAXIN ) 500 MG tablet Take 1 tablet (500 mg total) by mouth every 8 (eight) hours as needed for muscle spasms.   omeprazole  (PRILOSEC) 40 MG capsule TAKE 1 CAPSULE BY MOUTH EVERY DAY   predniSONE  (DELTASONE ) 5 MG tablet as needed (pain).   solifenacin (VESICARE) 5 MG tablet as directed.   tacrolimus (PROTOPIC) 0.1 % ointment Apply topically as needed (rash).   tetracycline  (SUMYCIN ) 250 MG capsule Take 1 capsule (250 mg total) by mouth daily.   tirzepatide  (MOUNJARO ) 5 MG/0.5ML Pen Inject 5 mg into the skin once a week.   [DISCONTINUED] atenolol  (TENORMIN ) 100 MG tablet Take 1 tablet (100 mg total) by mouth daily.     Allergies:   Ace inhibitors, Bee venom, Lisinopril , Piroxicam, and Wasp venom   Social History   Socioeconomic History   Marital status: Single    Spouse name: Not on file   Number of children: Not on file   Years of education: Not on file   Highest education level: Not on file  Occupational History   Occupation: post office    Employer: [employer]  Tobacco Use   Smoking status: Former    Current packs/day: 0.00    Types: Cigarettes    Quit date: 06/16/2018    Years since quitting: 5.8   Smokeless tobacco: Never  Vaping Use   Vaping status: Never Used  Substance and Sexual Activity   Alcohol use: Yes    Comment: social drinker, ocassional   Drug use: No   Sexual activity: Not Currently    Partners: Male  Other Topics Concern   Not on file  Social History Narrative   Exercising--no   Social Drivers of Health   Financial Resource  Strain: Low Risk  (05/18/2023)   Overall Financial Resource Strain (CARDIA)    Difficulty of Paying Living Expenses: Not hard at all  Food Insecurity: No Food Insecurity (05/18/2023)   Hunger Vital Sign    Worried About Running Out of Food in the Last Year: Never true    Ran Out of Food in the Last Year: Never true  Transportation Needs: Not on file  Physical Activity: Sufficiently Active (05/18/2023)   Exercise Vital Sign    Days of Exercise per Week: 5 days    Minutes of Exercise per Session: 120 min  Stress: Stress Concern Present (05/18/2023)  [employer] of Occupational Health - Occupational Stress Questionnaire    Feeling of Stress : To some extent  Social Connections: Socially Isolated (05/18/2023)   Social Connection and Isolation Panel    Frequency of Communication with Friends and Family: Once a week    Frequency of Social Gatherings with Friends and Family: Once a week    Attends Religious Services: Never    Database Administrator or Organizations: No    Attends Engineer, Structural: Not on file    Marital Status: Never married    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 There is no history of Colon cancer or Stomach cancer.  ROS:   Please see the history of present illness.      EKGs/Labs/Other Studies Reviewed:    The following studies were reviewed today:  Cardiac Studies & Procedures    ______________________________________________________________________________________________   STRESS TESTS  MYOCARDIAL PERFUSION IMAGING 09/05/2015  Interpretation Summary  Nuclear stress EF: 45%.  The left ventricular ejection fraction is mildly decreased (45-54%).  This is an intermediate risk study.   ECHOCARDIOGRAM  ECHOCARDIOGRAM COMPLETE 04/04/2024  Narrative ECHOCARDIOGRAM REPORT    Patient Name:   VERGENE MARLAND Date of Exam: 04/04/2024 Medical Rec #:  [id number]       Height:       65.0 in Accession #:    [id number]      Weight:       175.2 lb Date of Birth:  1965/06/15       BSA:          1.870 m Patient Age:    58 years        BP:           112/80 mmHg Patient Gender: F               HR:           76 bpm. Exam Location:  [address]  Procedure: 2D Echo, 3D Echo, Cardiac Doppler, Color Doppler and Strain Analysis (Both Spectral and Color Flow Doppler were utilized during procedure).  Indications:    I34.0 MR  History:        Patient has prior history of Echocardiogram examinations, most recent 12/16/2020. MR, Arrythmias:LBBB and PVC; Risk Factors:Hypertension, Diabetes, Dyslipidemia and Former Smoker.  Sonographer:    Elsie Bohr RDCS Referring Phys: [id number] [facility] Jaylynne Birkhead  IMPRESSIONS   1. Left ventricular ejection fraction, by estimation, is 50 to 55%. Left ventricular ejection fraction by 3D volume is 50 %. The left ventricle has low normal function. The left ventricle has no regional wall motion abnormalities. Left ventricular diastolic parameters were normal. The average left ventricular global longitudinal strain is -18.5 %. The global longitudinal strain is normal. 2. Right ventricular systolic function is normal. The right ventricular size is normal. There is normal pulmonary artery systolic pressure. The estimated right ventricular systolic pressure is 29.6 mmHg. 3. The mitral valve is normal in structure. Mild mitral valve  regurgitation.  No evidence of mitral stenosis. 4. The aortic valve is normal in structure. Aortic valve regurgitation is not visualized. No aortic stenosis is present. 5. The inferior vena cava is normal in size with greater than 50% respiratory variability, suggesting right atrial pressure of 3 mmHg.  FINDINGS Left Ventricle: Left ventricular ejection fraction, by estimation, is 50 to 55%. Left ventricular ejection fraction by 3D volume is 50 %. The left ventricle has low normal function. The left ventricle has no regional wall motion abnormalities. The average left ventricular global longitudinal strain is -18.5 %. Strain was performed and the global longitudinal strain is normal. 3D ejection fraction reviewed and evaluated as part of the interpretation. Alternate measurement of EF is felt to be most reflective of LV function. The left ventricular internal cavity size was normal in size. There is no left ventricular hypertrophy. Abnormal (paradoxical) septal motion, consistent with left bundle branch block. Left ventricular diastolic parameters were normal. Normal left ventricular filling pressure.  Right Ventricle: The right ventricular size is normal. No increase in right ventricular wall thickness. Right ventricular systolic function is normal. There is normal pulmonary artery systolic pressure. The tricuspid regurgitant velocity is 2.58 m/s, and with an assumed right atrial pressure of 3 mmHg, the estimated right ventricular systolic pressure is 29.6 mmHg.  Left Atrium: Left atrial size was normal in size.  Right Atrium: Right atrial size was normal in size.  Pericardium: There is no evidence of pericardial effusion.  Mitral Valve: The mitral valve is normal in structure. Mild mitral valve regurgitation. No evidence of mitral valve stenosis.  Tricuspid Valve: The tricuspid valve is normal in structure. Tricuspid valve regurgitation is mild . No evidence of tricuspid stenosis.  Aortic  Valve: The aortic valve is normal in structure. Aortic valve regurgitation is not visualized. No aortic stenosis is present.  Pulmonic Valve: The pulmonic valve was normal in structure. Pulmonic valve regurgitation is not visualized. No evidence of pulmonic stenosis.  Aorta: The aortic root is normal in size and structure.  Venous: The inferior vena cava is normal in size with greater than 50% respiratory variability, suggesting right atrial pressure of 3 mmHg.  IAS/Shunts: No atrial level shunt detected by color flow Doppler.  Additional Comments: 3D was performed not requiring image post processing on an independent workstation and was normal.   LEFT VENTRICLE PLAX 2D LVIDd:         5.30 cm         Diastology LVIDs:         3.70 cm         LV e' medial:    8.05 cm/s LV PW:         1.00 cm         LV E/e' medial:  10.1 LV IVS:        1.00 cm         LV e' lateral:   9.14 cm/s LVOT diam:     1.90 cm         LV E/e' lateral: 8.9 LV SV:         58 LV SV Index:   31              2D Longitudinal LVOT Area:     2.84 cm        Strain LV IVRT:       119 msec        2D Strain GLS   -16.8 % (A4C): 2D Strain GLS   -  19.3 % (A3C): 2D Strain GLS   -19.4 % (A2C): 2D Strain GLS   -18.5 % Avg:  3D Volume EF LV 3D EF:    Left ventricul ar ejection fraction by 3D volume is 50 %.  3D Volume EF: 3D EF:        50 % LV EDV:       156 ml LV ESV:       78 ml LV SV:        77 ml  RIGHT VENTRICLE             IVC RV S prime:     12.20 cm/s  IVC diam: 1.50 cm TAPSE (M-mode): 2.2 cm RVSP:           29.6 mmHg  LEFT ATRIUM             Index        RIGHT ATRIUM           Index LA diam:        3.60 cm 1.93 cm/m   RA Pressure: 3.00 mmHg LA Vol (A2C):   49.4 ml 26.42 ml/m  RA Area:     12.90 cm LA Vol (A4C):   41.9 ml 22.41 ml/m  RA Volume:   31.10 ml  16.63 ml/m LA Biplane Vol: 47.9 ml 25.62 ml/m AORTIC VALVE LVOT Vmax:   90.20 cm/s LVOT Vmean:  67.600 cm/s LVOT VTI:    0.206  m  AORTA Ao Root diam: 2.80 cm Ao Asc diam:  3.20 cm  MITRAL VALVE                TRICUSPID VALVE MV Area (PHT): 4.41 cm     TR Peak grad:   26.6 mmHg MV Decel Time: 172 msec     TR Vmax:        258.00 cm/s MV E velocity: 81.20 cm/s   Estimated RAP:  3.00 mmHg MV A velocity: 114.00 cm/s  RVSP:           29.6 mmHg MV E/A ratio:  0.71 SHUNTS Systemic VTI:  0.21 m Systemic Diam: 1.90 cm  Wilbert Bihari MD Electronically signed by Wilbert Bihari MD Signature Date/Time: 04/04/2024/9:05:10 AM    Final          ______________________________________________________________________________________________          Recent Labs: 08/19/2023: ALT 21; BUN 10; Creatinine, Ser 0.62; Hemoglobin 14.0; Platelets 266.0; Potassium 4.3; Sodium 139  Recent Lipid Panel    Component Value Date/Time   CHOL 156 08/19/2023 0947   TRIG 76.0 08/19/2023 0947   HDL 69.00 08/19/2023 0947   CHOLHDL 2 08/19/2023 0947   VLDL 15.2 08/19/2023 0947   LDLCALC 71 08/19/2023 0947   LDLDIRECT 148.1 01/20/2008 0912    Physical Exam:    VS:  BP 134/82   Pulse 71   Ht 5' 5 (1.651 m)   Wt 182 lb (82.6 kg)   SpO2 99%   BMI 30.29 kg/m     Wt Readings from Last 3 Encounters:  05/02/24 182 lb (82.6 kg)  03/10/24 175 lb 3.2 oz (79.5 kg)  11/04/23 175 lb 1.9 oz (79.4 kg)    Gen: No distress  Neck: No JVD Cardiac: No Rubs or Gallops, soft systolic murmur, RRR +2 radial pulses Respiratory: Clear to auscultation bilaterally, normal effort, normal  respiratory rate GI: Soft, nontender, non-distended  MS: No edema;  moves all extremities Integument: Skin feels well Neuro:  At time of  evaluation, alert and oriented to person/place/time/situation  Psych: Normal affect, patient feels fair  ASSESSMENT/PLAN:    Premature ventricular contractions and premature atrial contractions T2DM with HLD - Premature ventricular contractions (PVCs) and premature atrial contractions (PACs) are present. PVCs occurred  today, while PACs were absent. No sustained ventricular tachycardia or other concerning arrhythmias. Current management with atenolol  is effective but may contribute to insulin  resistance. - Will switch from atenolol  to carvedilol 25 mg PO BID in January to potentially improve insulin  sensitivity. - Monitor heart rate and symptoms after medication switch. - Check blood pressure at home regularly.  Primary hypertension Blood pressure is well-controlled with current medication regimen.  BB change as above  Nonrheumatic mitral valve regurgitation - Mild mitral regurgitation noted on echocardiogram. No significant changes warranting intervention at this time.  Repeat in 2028  Peripheral artery disease by hx only - LDL goal < 70; near goal March of 2025, no medication changes.  Longitudinal care: The evaluation and management services provided today reflect the complexity inherent in caring for this patient, including the ongoing longitudinal relationship and management of multiple chronic conditions and/or the need for care coordination. The visit required a comprehensive assessment and management plan tailored to the patient's unique needs Time was spent addressing not only the acute concerns but also the broader context of the patient's health, including preventive care, chronic disease management, and care coordination as appropriate.  Complex longitudinal is necessary for conditions including: PVC and PAC suppression with management of her hyperglycemia without worsening her symptom burden       Stanly Leavens, MD FASE [facility] Cardiologist [facility]  [address] [place], [place] [address] [phone]  10:15 AM

## 2024-05-02 NOTE — Patient Instructions (Signed)
 Medication Instructions:  Starting in January 2026:  Please STOP atenolol . Please START coreg  (carvedilol ) 25 mg by mouth twice a day.   *If you need a refill on your cardiac medications before your next appointment, please call your pharmacy*  Lab Work: None.  If you have labs (blood work) drawn today and your tests are completely normal, you will receive your results only by: MyChart Message (if you have MyChart) OR A paper copy in the mail If you have any lab test that is abnormal or we need to change your treatment, we will call you to review the results.  Testing/Procedures: None.  Follow-Up: At [facility], you and your health needs are our priority.  As part of our continuing mission to provide you with exceptional heart care, our providers are all part of one team.  This team includes your primary Cardiologist (physician) and Advanced Practice Providers or APPs (Physician Assistants and Nurse Practitioners) who all work together to provide you with the care you need, when you need it.  Your next appointment:   1 year(s)  Provider:   Stanly DELENA Leavens, MD

## 2024-05-10 DIAGNOSIS — E119 Type 2 diabetes mellitus without complications: Secondary | ICD-10-CM | POA: Diagnosis not present

## 2024-05-10 DIAGNOSIS — R635 Abnormal weight gain: Secondary | ICD-10-CM | POA: Diagnosis not present

## 2024-05-10 DIAGNOSIS — I739 Peripheral vascular disease, unspecified: Secondary | ICD-10-CM | POA: Diagnosis not present

## 2024-05-10 DIAGNOSIS — I10 Essential (primary) hypertension: Secondary | ICD-10-CM | POA: Diagnosis not present

## 2024-05-11 ENCOUNTER — Other Ambulatory Visit: Payer: Self-pay | Admitting: Family Medicine

## 2024-05-11 DIAGNOSIS — Z3009 Encounter for other general counseling and advice on contraception: Secondary | ICD-10-CM

## 2024-05-17 ENCOUNTER — Ambulatory Visit (INDEPENDENT_AMBULATORY_CARE_PROVIDER_SITE_OTHER)

## 2024-05-17 DIAGNOSIS — Z1231 Encounter for screening mammogram for malignant neoplasm of breast: Secondary | ICD-10-CM | POA: Diagnosis not present

## 2024-05-26 ENCOUNTER — Telehealth: Payer: Self-pay | Admitting: Family Medicine

## 2024-05-26 NOTE — Telephone Encounter (Signed)
 Copied from CRM [id number]. Topic: Referral - Status >> May 26, 2024  4:21 PM Alfonso ORN wrote: Reason for CRM: [facility] endocrinology called to state  pt already estab seen a few weeks  ago: 10/29, 12/10 and just wanted to make office aware. Provided fax as they will send information.

## 2024-06-02 ENCOUNTER — Ambulatory Visit: Admitting: Internal Medicine

## 2024-06-07 ENCOUNTER — Ambulatory Visit: Admitting: Internal Medicine

## 2024-06-15 ENCOUNTER — Encounter: Payer: Self-pay | Admitting: Family Medicine

## 2024-06-15 ENCOUNTER — Other Ambulatory Visit: Payer: Self-pay | Admitting: Family Medicine

## 2024-06-15 DIAGNOSIS — L409 Psoriasis, unspecified: Secondary | ICD-10-CM

## 2024-06-15 MED ORDER — CLOBETASOL PROPIONATE 0.05 % EX SOLN: 1.0000 | Freq: Two times a day (BID) | CUTANEOUS | 0 refills | Status: AC

## 2024-06-16 ENCOUNTER — Encounter: Payer: Self-pay | Admitting: Internal Medicine

## 2024-07-07 ENCOUNTER — Other Ambulatory Visit: Payer: Self-pay | Admitting: Family Medicine

## 2024-07-07 DIAGNOSIS — I10 Essential (primary) hypertension: Secondary | ICD-10-CM

## 2024-07-07 DIAGNOSIS — K219 Gastro-esophageal reflux disease without esophagitis: Secondary | ICD-10-CM

## 2024-08-21 ENCOUNTER — Encounter: Admitting: Family Medicine

## 2024-08-22 ENCOUNTER — Encounter: Admitting: Family Medicine
